# Patient Record
Sex: Male | Born: 1955 | Race: Black or African American | Hispanic: No | Marital: Single | Smoking: Former smoker
Health system: Southern US, Community
[De-identification: ages and names within clinical notes are randomized; demographics above are authoritative.]

## PROBLEM LIST (undated history)

## (undated) DIAGNOSIS — R011 Cardiac murmur, unspecified: Secondary | ICD-10-CM

## (undated) DIAGNOSIS — J449 Chronic obstructive pulmonary disease, unspecified: Secondary | ICD-10-CM

## (undated) DIAGNOSIS — N186 End stage renal disease: Secondary | ICD-10-CM

## (undated) DIAGNOSIS — N400 Enlarged prostate without lower urinary tract symptoms: Secondary | ICD-10-CM

## (undated) DIAGNOSIS — E119 Type 2 diabetes mellitus without complications: Secondary | ICD-10-CM

## (undated) DIAGNOSIS — E7800 Pure hypercholesterolemia, unspecified: Secondary | ICD-10-CM

## (undated) DIAGNOSIS — I10 Essential (primary) hypertension: Secondary | ICD-10-CM

## (undated) DIAGNOSIS — Z992 Dependence on renal dialysis: Secondary | ICD-10-CM

## (undated) HISTORY — DX: Cardiac murmur, unspecified: R01.1

## (undated) HISTORY — DX: Pure hypercholesterolemia, unspecified: E78.00

## (undated) HISTORY — DX: Essential (primary) hypertension: I10

## (undated) HISTORY — DX: Type 2 diabetes mellitus without complications: E11.9

## (undated) HISTORY — DX: Chronic obstructive pulmonary disease, unspecified: J44.9

## (undated) NOTE — *Deleted (*Deleted)
10/29/2019 3:52 PM   Daniel Thomas [birth year] [id number]  Referring provider: Marguerita Merles, [place],  [place] [address] No chief complaint on file.   HPI: Daniel Thomas is a 47 y.o. male who returns for a 4 month follow up of prostate cancer.   He was first diagnosed with this in 02/2018 at which time his PSA had jumped up to 20 from 10 the previous year. Is also noted to have multiple nodules on rectal exam. Prostate biopsy revealed Gleason 3+3 in 6 of 12 cores, primarily on the left up to 53% of the tissue.  PSA was elevated at 20.2 on 10/25/2019.   Patient underwent placement of a 23 cm tip to cuff tunneled hemodialysis catheter via the left internal jugular vein and removal of previous catheter with Dr. Lucky Cowboy on 10/17/2019.  PSA trend: Component     Latest Ref Rng & Units 09/22/2017 08/27/2018 06/24/2019 10/25/2019  Prostate Specific Ag, Serum     0.0 - 4.0 ng/mL 10.0 (H) 13.4 (H) 16.7 (H) 20.2 (H)     1. Prostate cancer   PMH: Past Medical History:  Diagnosis Date  . COPD (chronic obstructive pulmonary disease) ([place])   . Diabetes mellitus without complication ([place])   . Enlarged prostate   . ESRD on dialysis ([place])   . Heart murmur   . High cholesterol   . Hypertension     Surgical History: Past Surgical History:  Procedure Laterality Date  . DIALYSIS/PERMA CATHETER INSERTION N/A 01/12/2018   Procedure: DIALYSIS/PERMA CATHETER INSERTION;  Surgeon: Katha Cabal, MD;  Location: [facility];  Service: Cardiovascular;  Laterality: N/A;  . DIALYSIS/PERMA CATHETER INSERTION N/A 05/16/2019   Procedure: DIALYSIS/PERMA CATHETER INSERTION;  Surgeon: Algernon Huxley, MD;  Location: [facility];  Service: Cardiovascular;  Laterality: N/A;  . DIALYSIS/PERMA CATHETER INSERTION N/A 10/17/2019   Procedure: DIALYSIS/PERMA CATHETER INSERTION;  Surgeon: Algernon Huxley, MD;  Location: [facility];  Service: Cardiovascular;  Laterality:  N/A;  . DIALYSIS/PERMA CATHETER REMOVAL N/A 05/13/2019   Procedure: DIALYSIS/PERMA CATHETER REMOVAL;  Surgeon: Algernon Huxley, MD;  Location: [facility];  Service: Cardiovascular;  Laterality: N/A;  . TEE WITHOUT CARDIOVERSION N/A 05/15/2019   Procedure: TRANSESOPHAGEAL ECHOCARDIOGRAM (TEE);  Surgeon: Teodoro Spray, MD;  Location: [facility];  Service: Cardiovascular;  Laterality: N/A;    Home Medications:  Allergies as of 10/29/2019   No Known Allergies     Medication List       Accurate as of October 28, 2019  3:52 PM. If you have any questions, ask your nurse or doctor.        albuterol 108 (90 Base) MCG/ACT inhaler Commonly known as: VENTOLIN HFA Inhale 2 puffs into the lungs every 6 (six) hours as needed for wheezing or shortness of breath.   amLODipine 10 MG tablet Commonly known as: NORVASC Take 1 tablet (10 mg total) by mouth daily.   atorvastatin 40 MG tablet Commonly known as: LIPITOR   Auryxia 1 GM 210 MG(Fe) tablet Generic drug: ferric citrate Take by mouth.   calcium carbonate 500 MG chewable tablet Commonly known as: TUMS - dosed in mg elemental calcium Chew 2.5 tablets (500 mg of elemental calcium total) by mouth every 6 (six) hours as needed for indigestion.   carvedilol 12.5 MG tablet Commonly known as: COREG Take 12.5 mg by mouth 2 (two) times daily with a meal.   ceFAZolin 1-4 GM/50ML-%  Soln Commonly known as: ANCEF Inject 50 mLs (1 g total) into the vein daily.   citalopram 20 MG tablet Commonly known as: CELEXA Take 20 mg by mouth daily.   Drisdol 1.25 MG (50000 UT) capsule Generic drug: ergocalciferol Take by mouth. Administered at analysis   feeding supplement (GLUCERNA SHAKE) Liqd [facility]   hydrALAZINE 25 MG tablet Commonly known as: APRESOLINE Take 1 tablet (25 mg total) by mouth every 8 (eight) hours.   linagliptin 5 MG Tabs tablet Commonly known as: TRADJENTA Take 1 tablet (5 mg total) by mouth daily.   lovastatin  40 MG tablet Commonly known as: MEVACOR Take 1 tablet (40 mg total) by mouth at bedtime.   MIRCERA IJ Mircera   traZODone 50 MG tablet Commonly known as: DESYREL Take 25 mg by mouth at bedtime as needed for sleep.       Allergies: No Known Allergies  Family History: No family history on file.  Social History:  reports that he has quit smoking. He quit after 20.00 years of use. He has never used smokeless tobacco. He reports that he does not drink alcohol and does not use drugs.   Physical Exam: There were no vitals taken for this visit.  Constitutional:  Alert and oriented, No acute distress. HEENT: [place] AT, moist mucus membranes.  Trachea midline, no masses. Cardiovascular: No clubbing, cyanosis, or edema. Respiratory: Normal respiratory effort, no increased work of breathing. GI: Abdomen is soft, nontender, nondistended, no abdominal masses GU: No CVA tenderness Lymph: No cervical or inguinal lymphadenopathy. Skin: No rashes, bruises or suspicious lesions. Neurologic: Grossly intact, no focal deficits, moving all 4 extremities. Psychiatric: Normal mood and affect.  Laboratory Data:  Lab Results  Component Value Date   CREATININE 6.35 (H) 06/29/2019    No results found for: PSA  No results found for: TESTOSTERONE  Lab Results  Component Value Date   HGBA1C 8.5 (H) 01/07/2018    Urinalysis   Pertinent Imaging: *** No results found for this or any previous visit.  No results found for this or any previous visit.  No results found for this or any previous visit.  No results found for this or any previous visit.  Results for orders placed during the hospital encounter of 01/12/18  US RENAL  Narrative CLINICAL DATA:  Acute renal failure.  EXAM: RENAL / URINARY TRACT ULTRASOUND COMPLETE  COMPARISON:  No recent prior.  FINDINGS: Right Kidney:  Renal measurements: 9.9 x 5.8 x 5.6 cm. = volume: 167.2 mL. Increased echogenicity suggesting chronic  medical renal disease. No hydronephrosis. 1.4 cm and 0.8 cm simple cysts. Trace amount of fluid noted about the right kidney.  Left Kidney:  Renal measurements: 9.2 x 6.3 x 5.7 cm = volume: 172.3 mL. Increased echogenicity suggesting chronic medical renal disease. No hydronephrosis. 1.6 cm and 1.1 cm simple cysts. Trace amount of fluid noted about the left kidney.  Bladder:  Appears normal for degree of bladder distention.  IMPRESSION: 1.  Bilateral simple renal cysts.  2. Increased echogenicity both kidneys consistent with chronic medical renal disease. Trace amount of fluid noted about the kidneys. No hydronephrosis or bladder distention.   Electronically Signed By: Marcello Moores  Register On: 01/15/2018 14:55  No results found for this or any previous visit.  No results found for this or any previous visit.  No results found for this or any previous visit.   Assessment & Plan:     No follow-ups on file.  [facility]  [address], [place], [place] [address] [phone]  I, Selena Batten, am acting as a scribe for Dr. Hollice Espy.  {Add Barista Statement}

---

## 2006-12-14 ENCOUNTER — Emergency Department: Payer: Self-pay | Admitting: Emergency Medicine

## 2017-08-29 ENCOUNTER — Ambulatory Visit: Payer: Self-pay | Admitting: Urology

## 2017-09-22 ENCOUNTER — Encounter: Payer: Self-pay | Admitting: Urology

## 2017-09-22 ENCOUNTER — Emergency Department
Admission: EM | Admit: 2017-09-22 | Discharge: 2017-09-22 | Disposition: A | Discharge status: Home / Self Care | Payer: BLUE CROSS/BLUE SHIELD | Attending: Emergency Medicine | Admitting: Emergency Medicine

## 2017-09-22 ENCOUNTER — Ambulatory Visit (INDEPENDENT_AMBULATORY_CARE_PROVIDER_SITE_OTHER): Payer: BLUE CROSS/BLUE SHIELD | Admitting: Urology

## 2017-09-22 ENCOUNTER — Encounter: Payer: Self-pay | Admitting: Emergency Medicine

## 2017-09-22 VITALS — BP 230/124 | HR 108 | Ht 59.0 in | Wt 167.2 lb

## 2017-09-22 DIAGNOSIS — N189 Chronic kidney disease, unspecified: Secondary | ICD-10-CM | POA: Diagnosis not present

## 2017-09-22 DIAGNOSIS — I129 Hypertensive chronic kidney disease with stage 1 through stage 4 chronic kidney disease, or unspecified chronic kidney disease: Secondary | ICD-10-CM | POA: Insufficient documentation

## 2017-09-22 DIAGNOSIS — N184 Chronic kidney disease, stage 4 (severe): Secondary | ICD-10-CM

## 2017-09-22 DIAGNOSIS — R972 Elevated prostate specific antigen [PSA]: Secondary | ICD-10-CM | POA: Diagnosis not present

## 2017-09-22 DIAGNOSIS — Z87891 Personal history of nicotine dependence: Secondary | ICD-10-CM | POA: Diagnosis not present

## 2017-09-22 DIAGNOSIS — E1122 Type 2 diabetes mellitus with diabetic chronic kidney disease: Secondary | ICD-10-CM | POA: Diagnosis not present

## 2017-09-22 DIAGNOSIS — I10 Essential (primary) hypertension: Secondary | ICD-10-CM

## 2017-09-22 DIAGNOSIS — Z79899 Other long term (current) drug therapy: Secondary | ICD-10-CM | POA: Diagnosis not present

## 2017-09-22 DIAGNOSIS — Z7984 Long term (current) use of oral hypoglycemic drugs: Secondary | ICD-10-CM | POA: Insufficient documentation

## 2017-09-22 DIAGNOSIS — Z7982 Long term (current) use of aspirin: Secondary | ICD-10-CM | POA: Diagnosis not present

## 2017-09-22 DIAGNOSIS — J449 Chronic obstructive pulmonary disease, unspecified: Secondary | ICD-10-CM | POA: Diagnosis not present

## 2017-09-22 LAB — BLADDER SCAN AMB NON-IMAGING: Scan Result: 29

## 2017-09-22 MED ORDER — HYDRALAZINE HCL 50 MG PO TABS
25.0000 mg | ORAL_TABLET | Freq: Once | ORAL | Status: AC
  Administered 2017-09-22: 25 mg via ORAL
  Filled 2017-09-22: qty 1

## 2017-09-22 MED ORDER — AMLODIPINE BESYLATE 5 MG PO TABS
10.0000 mg | ORAL_TABLET | ORAL | Status: AC
  Administered 2017-09-22: 10 mg via ORAL
  Filled 2017-09-22: qty 2

## 2017-09-22 MED ORDER — METOPROLOL TARTRATE 50 MG PO TABS
50.0000 mg | ORAL_TABLET | Freq: Once | ORAL | Status: AC
  Administered 2017-09-22: 50 mg via ORAL
  Filled 2017-09-22: qty 1

## 2017-09-22 MED ORDER — HYDROCHLOROTHIAZIDE 12.5 MG PO CAPS
12.5000 mg | ORAL_CAPSULE | ORAL | Status: AC
  Administered 2017-09-22: 12.5 mg via ORAL
  Filled 2017-09-22 (×2): qty 1

## 2017-09-22 MED ORDER — CLONIDINE HCL 0.1 MG PO TABS
0.2000 mg | ORAL_TABLET | ORAL | Status: AC
  Administered 2017-09-22: 0.2 mg via ORAL
  Filled 2017-09-22: qty 2

## 2017-09-22 MED ORDER — LISINOPRIL 10 MG PO TABS
20.0000 mg | ORAL_TABLET | ORAL | Status: AC
  Administered 2017-09-22: 20 mg via ORAL
  Filled 2017-09-22: qty 2

## 2017-09-22 NOTE — ED Triage Notes (Signed)
Pt arrived from urologist with concerns over HTN. Pt's blood pressure in triage 228/118. Pt denies any pain. Pt has a hx of HTN and takes daily medication but did not take his medication this am.

## 2017-09-22 NOTE — ED Notes (Signed)
Pt given water to drink, but states he wants to wait until he gets home to eat.

## 2017-09-22 NOTE — ED Notes (Signed)
Spoke with MD Quentin Cornwall who gave verbal order for pt to be given his home bp medication. Pt unsure of which medication he takes. Will call pharmacy

## 2017-09-22 NOTE — ED Notes (Signed)
Spoke with [facility] who reports the following medication regimen; amlodipine 10mg  daily, metoprolol 50mg  twice a day, hydralazine 25mg  twice a day. Pt verifies with information

## 2017-09-22 NOTE — ED Triage Notes (Signed)
First Nurse Note:  Arrives from Urology clinic for ED evaluation of elevated BP.  Per staff, patient's blood pressure was elevated to 230/124 in clinic.  Patient is AAOx3.  Skin warm and dry. NAD.  States he did not take his bp medication today because he was in a hurry to make his appointment.

## 2017-09-22 NOTE — Progress Notes (Signed)
Upon arrival for his appointment with Dr. Hollice Espy, pts bp was elevated at 219/124. After his visit pt bp was at 230/117. I called and spoke with pt primary care clinic, [facility], they advised pt be seen in ED. Pt was escorted to ED per primary care doctors office.

## 2017-09-22 NOTE — Discharge Instructions (Addendum)
Follow-up with your primary care doctor as soon as possible for reevaluation of your blood pressure management.  Continue taking your blood pressure medicines every day as prescribed.  Call 911 if you have any sudden severe symptoms such as headache, vision change, passing out, chest pain, abdominal pain, or shortness of breath.

## 2017-09-22 NOTE — Progress Notes (Signed)
09/22/2017 8:22 PM   Daniel Thomas Nov 02, 1955 [id number]  Referring provider: Marguerita Merles, [place], [place] [address]  Chief Complaint  Patient presents with  . Elevated PSA    HPI: 62 year old male referred from [facility] for further evaluation of elevated PSA.  Patient underwent routine annual labs in July 2019 at which time his PSA was noted to be markedly elevated at 8.3.  We have no previous PSAs for comparison.  He believes it was 5 in the past.    He does have multiple medical comorbidities including hypertension, poorly controlled diabetes (hemoglobin A1c 13.5) amongst others.  No family history of prostate cancer.  He father had prostate issues which sound like BPH.   Some insignificant weight loss.  No bone pain.  No gross hematuria or UTIs.     He has no urinary symptoms including urgency, frequency, dysuria, decreased stream, or sensation of incomplete bladder emptying.  Nocturia x 3 but minimal bother.    IPSS    Row Name 09/22/17 1100         International Prostate Symptom Score   How often have you had the sensation of not emptying your bladder?  Not at All     How often have you had to urinate less than every two hours?  Not at All     How often have you found you stopped and started again several times when you urinated?  Not at All     How often have you found it difficult to postpone urination?  Not at All     How often have you had a weak urinary stream?  Not at All     How often have you had to strain to start urination?  Not at All     How many times did you typically get up at night to urinate?  3 Times     Total IPSS Score  3       Quality of Life due to urinary symptoms   If you were to spend the rest of your life with your urinary condition just the way it is now how would you feel about that?  Delighted        Score:  1-7 Mild 8-19 Moderate 20-35 Severe  PMH: Past Medical History:    Diagnosis Date  . COPD (chronic obstructive pulmonary disease) ([place])   . Diabetes mellitus without complication ([place])   . Heart murmur   . High cholesterol   . Hypertension     Surgical History: No past surgical history on file.  Home Medications:  Allergies as of 09/22/2017   No Known Allergies     Medication List        Accurate as of 09/22/17 11:59 PM. Always use your most recent med list.          amLODipine 10 MG tablet Commonly known as:  NORVASC Take 10 mg by mouth daily.   aspirin EC 81 MG tablet 81 mg nightly.   citalopram 20 MG tablet Commonly known as:  CELEXA Take 20 mg by mouth daily.   glipiZIDE 10 MG tablet Commonly known as:  GLUCOTROL   lisinopril-hydrochlorothiazide 20-12.5 MG tablet Commonly known as:  PRINZIDE,ZESTORETIC   lovastatin 40 MG tablet Commonly known as:  MEVACOR 40 mg nightly.   metFORMIN 1000 MG tablet Commonly known as:  GLUCOPHAGE Take by mouth.   metoprolol tartrate 50 MG tablet Commonly known as:  LOPRESSOR  Take 50 mg by mouth 2 (two) times daily.   naproxen 250 MG tablet Commonly known as:  NAPROSYN Take by mouth 2 (two) times daily with a meal.       Allergies: No Known Allergies  Family History: No family history on file.  Social History:  reports that he has quit smoking. He quit after 20.00 years of use. He has never used smokeless tobacco. He reports that he does not drink alcohol or use drugs.  ROS: UROLOGY Frequent Urination?: No Hard to postpone urination?: No Burning/pain with urination?: No Get up at night to urinate?: No Leakage of urine?: No Urine stream starts and stops?: No Trouble starting stream?: No Do you have to strain to urinate?: No Blood in urine?: No Urinary tract infection?: No Sexually transmitted disease?: No Injury to kidneys or bladder?: No Painful intercourse?: No Weak stream?: No Erection problems?: No Penile pain?: No  Gastrointestinal Nausea?: No Vomiting?:  No Indigestion/heartburn?: No Diarrhea?: No Constipation?: No  Constitutional Fever: No Night sweats?: No Weight loss?: No Fatigue?: No  Skin Skin rash/lesions?: No Itching?: No  Eyes Blurred vision?: No Double vision?: No  Ears/Nose/Throat Sore throat?: No Sinus problems?: No  Hematologic/Lymphatic Swollen glands?: No Easy bruising?: No  Cardiovascular Leg swelling?: No Chest pain?: No  Respiratory Cough?: No Shortness of breath?: No  Endocrine Excessive thirst?: No  Musculoskeletal Back pain?: No Joint pain?: No  Neurological Headaches?: No Dizziness?: No  Psychologic Depression?: No Anxiety?: No  Physical Exam: BP (!) 230/124 (BP Location: Left Arm, Patient Position: Sitting, Cuff Size: Normal) Comment: notified MD  Pulse (!) 108   Ht 4\' 11"  (1.499 m)   Wt 167 lb 3.2 oz (75.8 kg)   BMI 33.77 kg/m   Constitutional:  Alert and oriented, No acute distress. HEENT: [place] AT, moist mucus membranes.  Trachea midline, no masses. Cardiovascular: No clubbing, cyanosis, or edema. Respiratory: Normal respiratory effort, no increased work of breathing. GI: Abdomen is soft, nontender, nondistended, no abdominal masses GU: No CVA tenderness Rectal: Normal sphincter tone.  Massively elarged prostate, 50+ cc with rubbery nodule at apex, otherwise unremarkable. Skin: No rashes, bruises or suspicious lesions. Neurologic: Grossly intact, no focal deficits, moving all 4 extremities. Psychiatric: Normal mood and affect.  Laboratory Data: Records provided from [facility] clinic were reviewed  Hemoglobin A1c 13.5 Creatinine 2.72 PSA as above   Urinalysis N/a  Pertinent Imaging: Results for orders placed or performed in visit on 09/22/17  PSA  Result Value Ref Range   Prostate Specific Ag, Serum 10.0 (H) 0.0 - 4.0 ng/mL  Bladder Scan (Post Void Residual) in office  Result Value Ref Range   Scan Result 29     Assessment & Plan:    1. Elevated PSA  We  reviewed the implications of an elevated PSA and the uncertainty surrounding it. In general, a man's PSA increases with age and is produced by both normal and cancerous prostate tissue. Differential for elevated PSA is BPH, prostate cancer, infection, recent intercourse/ejaculation, prostate infarction, recent urethroscopic manipulation (foley placement/cystoscopy) and prostatitis. Management of an elevated PSA can include observation or prostate biopsy and wediscussed this in detail. We discussed that indications for prostate biopsy are defined by age and race specific PSA cutoffs as well as a PSA velocity of 0.75/year.  Given his age and ethnicity, I am somewhat concerned with his absolute PSA value.  That being said, his prostate is markedly enlarged today which may be appropriate for his volume.  Plan for repeat  PSA today.  If his PSA is stable or lower, we will likely follow him.  His PSA is more elevated, will consider prostate biopsy.  We did go ahead and discuss prostate biopsy at length today.  We discussed prostate biopsy in detail including the procedure itself, the risks of blood in the urine, stool, and ejaculate, serious infection, and discomfort. He is willing to proceed with this as discussed if needed.  - Bladder Scan (Post Void Residual) in office - PSA  2. CKD (chronic kidney disease) stage 4, GFR 15-29 ml/min (HCC) Void residual minimal today, not concern for obstruction as underlying protruding factor  Is likely related to poorly controlled diabetes and other comorbidities - Bladder Scan (Post Void Residual) in office - PSA   Return for f/u TBD based on repeat PSA- will call with results and recs.  Hollice Espy, MD  [facility] [address] [place], [place] [address] [phone]

## 2017-09-22 NOTE — ED Provider Notes (Signed)
[facility] Emergency Department Provider Note  ____________________________________________  Time seen: Approximately 7:03 PM  I have reviewed the triage vital signs and the nursing notes.   HISTORY  Chief Complaint Hypertension    HPI Daniel Thomas is a 62 y.o. male with a history of COPD diabetes high cholesterol and hypertension who was sent to the ED for evaluation of high blood pressure.   Patient denies any acute complaints.  He denies headache dizziness chest pain shortness of breath abdominal pain or back pain.  No syncope.  No exertional symptoms.  He reports that he has his medicines and has been taking them every day, but he did not take them this morning because he was going to see his doctor.  He reports being compliant otherwise.  Review of electronic medical record shows he recently saw his nephrologist who was unable to verify his blood pressure medicines because he left some at home, so they were deferring blood pressure management to primary care.  They recently completed labs and a renal ultrasound and are monitoring his kidney function closely.  Patient reports awareness and agreement with the counseling they provided including avoiding sugary drinks and sodas, no NSAIDs, increasing exercise.   Patient given hydralazine and metoprolol in triage.     Past Medical History:  Diagnosis Date  . COPD (chronic obstructive pulmonary disease) ([place])   . Diabetes mellitus without complication ([place])   . Heart murmur   . High cholesterol   . Hypertension      There are no active problems to display for this patient.    History reviewed. No pertinent surgical history.   Prior to Admission medications   Medication Sig Start Date End Date Taking? Authorizing Provider  amLODipine (NORVASC) 10 MG tablet Take 10 mg by mouth daily.    [provider]  aspirin EC 81 MG tablet 81 mg nightly. 11/15/05   [provider]  citalopram  (CELEXA) 20 MG tablet Take 20 mg by mouth daily.    [provider]  glipiZIDE (GLUCOTROL) 10 MG tablet  04/17/15   [provider]  lisinopril-hydrochlorothiazide (PRINZIDE,ZESTORETIC) 20-12.5 MG tablet  04/17/15   [provider]  lovastatin (MEVACOR) 40 MG tablet 40 mg nightly. 07/11/08   [provider]  metFORMIN (GLUCOPHAGE) 1000 MG tablet Take by mouth.    [provider]  metoprolol tartrate (LOPRESSOR) 50 MG tablet Take 50 mg by mouth 2 (two) times daily.    [provider]  naproxen (NAPROSYN) 250 MG tablet Take by mouth 2 (two) times daily with a meal.    [provider]     Allergies Patient has no known allergies.   No family history on file.  Social History Social History   Tobacco Use  . Smoking status: Former Smoker    Years: 20.00  . Smokeless tobacco: Never Used  Substance Use Topics  . Alcohol use: Never    Frequency: Never  . Drug use: Never    Review of Systems  Constitutional:   No fever or chills.  ENT:   No sore throat. No rhinorrhea. Cardiovascular:   No chest pain or syncope. Respiratory:   No dyspnea or cough. Gastrointestinal:   Negative for abdominal pain, vomiting and diarrhea.  Musculoskeletal:   Negative for focal pain or swelling All other systems reviewed and are negative except as documented above in ROS and HPI.  ____________________________________________   PHYSICAL EXAM:  VITAL SIGNS: ED Triage Vitals [09/22/17 1139]  Enc Vitals Group     BP (!) 228/118     Pulse Rate (!) 102     Resp 18     Temp 98.1 F (36.7 C)     Temp Source Oral     SpO2 100 %     Weight 167 lb 3.2 oz (75.8 kg)     Height 4\' 11"  (1.499 m)     Head Circumference      Peak Flow      Pain Score 0     Pain Loc      Pain Edu?      Excl. in [place]?     Vital signs reviewed, nursing assessments reviewed.   Constitutional:   Alert and oriented. Non-toxic appearance.  Ambulatory, calm. Eyes:    Conjunctivae are normal. EOMI. PERRL. ENT      Head:   Normocephalic and atraumatic.      Nose:   No congestion/rhinnorhea.       Mouth/Throat:   MMM, no pharyngeal erythema. No peritonsillar mass.       Neck:   No meningismus. Full ROM. Hematological/Lymphatic/Immunilogical:   No cervical lymphadenopathy. Cardiovascular:   RRR. Symmetric bilateral radial and DP pulses.  No murmurs. Cap refill less than 2 seconds. Respiratory:   Normal respiratory effort without tachypnea/retractions. Breath sounds are clear and equal bilaterally. No wheezes/rales/rhonchi. Gastrointestinal:   Soft and nontender. Non distended. There is no CVA tenderness.  No rebound, rigidity, or guarding. Genitourinary:   deferred Musculoskeletal:   Normal range of motion in all extremities. No joint effusions.  No lower extremity tenderness.  No edema. Neurologic:   Normal speech and language.  Motor grossly intact. No acute focal neurologic deficits are appreciated.  Skin:    Skin is warm, dry and intact. No rash noted.  No petechiae, purpura, or bullae.  ____________________________________________    LABS (pertinent positives/negatives) (all labs ordered are listed, but only abnormal results are displayed) Labs Reviewed - No data to display ____________________________________________   EKG    ____________________________________________    RADIOLOGY  No results found.  ____________________________________________   PROCEDURES Procedures  ____________________________________________    CLINICAL IMPRESSION / ASSESSMENT AND PLAN / ED COURSE  Pertinent labs & imaging results that were available during my care of the patient were reviewed by me and considered in my medical decision making (see chart for details).    Patient presents with asymptomatic severe uncontrolled hypertension.  No evidence of endorgan dysfunction.  Appears to be due to skipping his medication doses today.  Is provided 1  dose of his hydralazine and metoprolol earlier in triage.  Talking to the patient and reviewing electronic medical record, it seems that he also takes amlodipine 10, lisinopril 20, HCTZ 12.5 which I have ordered for him.  I will also give him a single dose of clonidine for a little bit quicker blood pressure improvement.  I like to monitor the patient in the ED to ensure improvement in the numbers, although his M AP is still far below levels considered worrisome for spontaneous hemorrhage.    ----------------------------------------- 7:07 PM on 09/22/2017 -----------------------------------------  Patient unwilling to wait any longer for his blood pressure to improve.  Remains asymptomatic.  He is in a hurry to be ordered at home because he has a relatively long drive to [place] to get home.  Counseled him to call 911 if he has any new or sudden symptoms.  Take his hydralazine and metoprolol when he gets home otherwise take  all his medications as prescribed in the morning follow-up with his doctor as soon as possible after the weekend.      ____________________________________________   FINAL CLINICAL IMPRESSION(S) / ED DIAGNOSES    Final diagnoses:  Essential hypertension  Chronic kidney disease, unspecified CKD stage     ED Discharge Orders    None      Portions of this note were generated with dragon dictation software. Dictation errors may occur despite best attempts at proofreading.    Carrie Mew, MD 09/22/17 Einar Crow

## 2017-09-23 LAB — PSA: Prostate Specific Ag, Serum: 10 ng/mL — ABNORMAL HIGH (ref 0.0–4.0)

## 2017-09-25 ENCOUNTER — Telehealth: Payer: Self-pay | Admitting: Urology

## 2017-09-25 NOTE — Telephone Encounter (Signed)
App made and instructions mailed to the patient Discussed with patient over the phone Judson Roch is verifying with patient's PCP that he does not need clearance to stop aspirin. Sharyn Lull

## 2017-09-25 NOTE — Telephone Encounter (Signed)
-----   Message from Hollice Espy, MD sent at 09/25/2017  7:55 AM EDT ----- We discussed prostate biopsy as an option in the office along with risks and benefits.  His PSA is actually going up and down to 10.  I would strongly recommend prostate biopsy.  Please provide him with an instruction sheet and schedule this.  Hollice Espy, MD

## 2017-10-24 ENCOUNTER — Other Ambulatory Visit: Payer: BLUE CROSS/BLUE SHIELD | Admitting: Urology

## 2017-10-30 ENCOUNTER — Telehealth: Payer: Self-pay

## 2017-10-30 NOTE — Telephone Encounter (Signed)
Cardiac clearance faxed to Dr. Alveria Apley office

## 2017-10-30 NOTE — Telephone Encounter (Signed)
-----   Message from Benard Halsted sent at 09/25/2017 10:08 AM EDT ----- Regarding: prostate BX I called the patient today to discuss the BX instructions and to set up is appointment. He had no idea what I was talking about at first. He was confused as to why he needed a BX and didn't remember talking to anyone about needing one. He stated that he was one aspirin everyday and was put on it by his PCP Dr. Lennox Grumbles because she thought he had a heartache in the past. He was not sure of who his cardiologist was or when he last saw one.  I went over all the instructions but I am not really confident that he truly understood them. I checked and there is not a DPR on file for him. Judson Roch can you please check to make sure he does not need clearance for the BX please to stop his aspirin. I am mailing the appointment and instructions to him and told him to call the office with any questions once he got them.   Thanks, Sharyn Lull

## 2017-11-07 ENCOUNTER — Ambulatory Visit: Payer: BLUE CROSS/BLUE SHIELD | Admitting: Urology

## 2017-11-14 ENCOUNTER — Other Ambulatory Visit: Payer: BLUE CROSS/BLUE SHIELD | Admitting: Urology

## 2017-11-28 ENCOUNTER — Ambulatory Visit: Payer: BLUE CROSS/BLUE SHIELD | Admitting: Urology

## 2018-01-05 ENCOUNTER — Telehealth: Payer: Self-pay

## 2018-01-05 NOTE — Telephone Encounter (Signed)
Cardiac Clearance was received from Dr. Nehemiah Massed and is ok to stop ASA 7 days prior to biopsy. Please schedule patient for biopsy at soonest available thanks

## 2018-01-05 NOTE — Telephone Encounter (Signed)
Spoke with patient went over instructions over the phone with the patient and mailed   Daniel Thomas

## 2018-01-06 ENCOUNTER — Emergency Department: Payer: BLUE CROSS/BLUE SHIELD

## 2018-01-06 ENCOUNTER — Inpatient Hospital Stay
Admission: EM | Admit: 2018-01-06 | Discharge: 2018-01-08 | DRG: 291 | Disposition: A | Discharge status: Home / Self Care | Payer: BLUE CROSS/BLUE SHIELD | Attending: Internal Medicine | Admitting: Internal Medicine

## 2018-01-06 DIAGNOSIS — D631 Anemia in chronic kidney disease: Secondary | ICD-10-CM | POA: Diagnosis present

## 2018-01-06 DIAGNOSIS — I5033 Acute on chronic diastolic (congestive) heart failure: Secondary | ICD-10-CM | POA: Diagnosis present

## 2018-01-06 DIAGNOSIS — J449 Chronic obstructive pulmonary disease, unspecified: Secondary | ICD-10-CM | POA: Diagnosis present

## 2018-01-06 DIAGNOSIS — I340 Nonrheumatic mitral (valve) insufficiency: Secondary | ICD-10-CM | POA: Diagnosis not present

## 2018-01-06 DIAGNOSIS — R972 Elevated prostate specific antigen [PSA]: Secondary | ICD-10-CM | POA: Diagnosis present

## 2018-01-06 DIAGNOSIS — Z87891 Personal history of nicotine dependence: Secondary | ICD-10-CM

## 2018-01-06 DIAGNOSIS — J9601 Acute respiratory failure with hypoxia: Secondary | ICD-10-CM | POA: Diagnosis not present

## 2018-01-06 DIAGNOSIS — R0600 Dyspnea, unspecified: Secondary | ICD-10-CM

## 2018-01-06 DIAGNOSIS — I10 Essential (primary) hypertension: Secondary | ICD-10-CM | POA: Diagnosis present

## 2018-01-06 DIAGNOSIS — I161 Hypertensive emergency: Secondary | ICD-10-CM | POA: Diagnosis present

## 2018-01-06 DIAGNOSIS — E785 Hyperlipidemia, unspecified: Secondary | ICD-10-CM | POA: Diagnosis present

## 2018-01-06 DIAGNOSIS — I169 Hypertensive crisis, unspecified: Secondary | ICD-10-CM | POA: Diagnosis present

## 2018-01-06 DIAGNOSIS — N2581 Secondary hyperparathyroidism of renal origin: Secondary | ICD-10-CM | POA: Diagnosis present

## 2018-01-06 DIAGNOSIS — Z7982 Long term (current) use of aspirin: Secondary | ICD-10-CM

## 2018-01-06 DIAGNOSIS — Z9119 Patient's noncompliance with other medical treatment and regimen: Secondary | ICD-10-CM

## 2018-01-06 DIAGNOSIS — F329 Major depressive disorder, single episode, unspecified: Secondary | ICD-10-CM | POA: Diagnosis present

## 2018-01-06 DIAGNOSIS — I130 Hypertensive heart and chronic kidney disease with heart failure and stage 1 through stage 4 chronic kidney disease, or unspecified chronic kidney disease: Secondary | ICD-10-CM | POA: Diagnosis not present

## 2018-01-06 DIAGNOSIS — Z7984 Long term (current) use of oral hypoglycemic drugs: Secondary | ICD-10-CM | POA: Diagnosis not present

## 2018-01-06 DIAGNOSIS — I361 Nonrheumatic tricuspid (valve) insufficiency: Secondary | ICD-10-CM | POA: Diagnosis not present

## 2018-01-06 DIAGNOSIS — E872 Acidosis: Secondary | ICD-10-CM | POA: Diagnosis present

## 2018-01-06 DIAGNOSIS — N400 Enlarged prostate without lower urinary tract symptoms: Secondary | ICD-10-CM | POA: Diagnosis present

## 2018-01-06 DIAGNOSIS — R809 Proteinuria, unspecified: Secondary | ICD-10-CM

## 2018-01-06 DIAGNOSIS — E7800 Pure hypercholesterolemia, unspecified: Secondary | ICD-10-CM | POA: Diagnosis present

## 2018-01-06 DIAGNOSIS — E1122 Type 2 diabetes mellitus with diabetic chronic kidney disease: Secondary | ICD-10-CM | POA: Diagnosis present

## 2018-01-06 DIAGNOSIS — N186 End stage renal disease: Secondary | ICD-10-CM | POA: Diagnosis not present

## 2018-01-06 DIAGNOSIS — Z9114 Patient's other noncompliance with medication regimen: Secondary | ICD-10-CM | POA: Diagnosis not present

## 2018-01-06 DIAGNOSIS — N179 Acute kidney failure, unspecified: Secondary | ICD-10-CM | POA: Diagnosis present

## 2018-01-06 DIAGNOSIS — J9600 Acute respiratory failure, unspecified whether with hypoxia or hypercapnia: Secondary | ICD-10-CM | POA: Diagnosis present

## 2018-01-06 DIAGNOSIS — I160 Hypertensive urgency: Secondary | ICD-10-CM

## 2018-01-06 DIAGNOSIS — N184 Chronic kidney disease, stage 4 (severe): Secondary | ICD-10-CM | POA: Diagnosis present

## 2018-01-06 DIAGNOSIS — Z8249 Family history of ischemic heart disease and other diseases of the circulatory system: Secondary | ICD-10-CM

## 2018-01-06 HISTORY — DX: Benign prostatic hyperplasia without lower urinary tract symptoms: N40.0

## 2018-01-06 LAB — CBC
HCT: 33.3 % — ABNORMAL LOW (ref 39.0–52.0)
Hemoglobin: 10.6 g/dL — ABNORMAL LOW (ref 13.0–17.0)
MCH: 28.6 pg (ref 26.0–34.0)
MCHC: 31.8 g/dL (ref 30.0–36.0)
MCV: 89.8 fL (ref 80.0–100.0)
Platelets: 424 10*3/uL — ABNORMAL HIGH (ref 150–400)
RBC: 3.71 MIL/uL — ABNORMAL LOW (ref 4.22–5.81)
RDW: 14.2 % (ref 11.5–15.5)
WBC: 13.2 10*3/uL — AB (ref 4.0–10.5)
nRBC: 0 % (ref 0.0–0.2)

## 2018-01-06 LAB — CBC WITH DIFFERENTIAL/PLATELET
ABS IMMATURE GRANULOCYTES: 0.04 10*3/uL (ref 0.00–0.07)
Basophils Absolute: 0.1 10*3/uL (ref 0.0–0.1)
Basophils Relative: 1 %
EOS PCT: 2 %
Eosinophils Absolute: 0.2 10*3/uL (ref 0.0–0.5)
HCT: 32.5 % — ABNORMAL LOW (ref 39.0–52.0)
Hemoglobin: 10.6 g/dL — ABNORMAL LOW (ref 13.0–17.0)
Immature Granulocytes: 0 %
Lymphocytes Relative: 15 %
Lymphs Abs: 1.5 10*3/uL (ref 0.7–4.0)
MCH: 29.4 pg (ref 26.0–34.0)
MCHC: 32.6 g/dL (ref 30.0–36.0)
MCV: 90 fL (ref 80.0–100.0)
MONO ABS: 0.9 10*3/uL (ref 0.1–1.0)
MONOS PCT: 8 %
NRBC: 0 % (ref 0.0–0.2)
Neutro Abs: 7.6 10*3/uL (ref 1.7–7.7)
Neutrophils Relative %: 74 %
Platelets: 394 10*3/uL (ref 150–400)
RBC: 3.61 MIL/uL — ABNORMAL LOW (ref 4.22–5.81)
RDW: 14.1 % (ref 11.5–15.5)
WBC: 10.3 10*3/uL (ref 4.0–10.5)

## 2018-01-06 LAB — BASIC METABOLIC PANEL
ANION GAP: 5 (ref 5–15)
BUN: 50 mg/dL — ABNORMAL HIGH (ref 8–23)
CO2: 20 mmol/L — ABNORMAL LOW (ref 22–32)
Calcium: 8.1 mg/dL — ABNORMAL LOW (ref 8.9–10.3)
Chloride: 112 mmol/L — ABNORMAL HIGH (ref 98–111)
Creatinine, Ser: 4.01 mg/dL — ABNORMAL HIGH (ref 0.61–1.24)
GFR calc non Af Amer: 15 mL/min — ABNORMAL LOW (ref >60)
GFR, EST AFRICAN AMERICAN: 17 mL/min — AB (ref >60)
Glucose, Bld: 183 mg/dL — ABNORMAL HIGH (ref 70–99)
Potassium: 4.6 mmol/L (ref 3.5–5.1)
Sodium: 137 mmol/L (ref 135–145)

## 2018-01-06 LAB — TROPONIN I: Troponin I: 0.04 ng/mL (ref <0.03)

## 2018-01-06 LAB — INFLUENZA PANEL BY PCR (TYPE A & B)
INFLAPCR: NEGATIVE
Influenza B By PCR: NEGATIVE

## 2018-01-06 LAB — BRAIN NATRIURETIC PEPTIDE: B Natriuretic Peptide: 467 pg/mL — ABNORMAL HIGH (ref 0.0–100.0)

## 2018-01-06 MED ORDER — ACETAMINOPHEN 650 MG RE SUPP: 650.0000 mg | Freq: Four times a day (QID) | RECTAL | Status: DC | PRN

## 2018-01-06 MED ORDER — SODIUM CHLORIDE 0.9% FLUSH
3.0000 mL | Freq: Two times a day (BID) | INTRAVENOUS | Status: DC
  Administered 2018-01-06 – 2018-01-08 (×4): 3 mL via INTRAVENOUS

## 2018-01-06 MED ORDER — POLYETHYLENE GLYCOL 3350 17 G PO PACK: 17.0000 g | PACK | Freq: Every day | ORAL | Status: DC | PRN

## 2018-01-06 MED ORDER — SODIUM CHLORIDE 0.9 % IV SOLN: 250.0000 mL | INTRAVENOUS | Status: DC | PRN

## 2018-01-06 MED ORDER — LABETALOL HCL 5 MG/ML IV SOLN
20.0000 mg | Freq: Once | INTRAVENOUS | Status: AC
  Administered 2018-01-06: 20 mg via INTRAVENOUS
  Filled 2018-01-06: qty 4

## 2018-01-06 MED ORDER — ONDANSETRON HCL 4 MG/2ML IJ SOLN: 4.0000 mg | Freq: Four times a day (QID) | INTRAMUSCULAR | Status: DC | PRN

## 2018-01-06 MED ORDER — ACETAMINOPHEN 325 MG PO TABS: 650.0000 mg | ORAL_TABLET | Freq: Four times a day (QID) | ORAL | Status: DC | PRN

## 2018-01-06 MED ORDER — HYDRALAZINE HCL 20 MG/ML IJ SOLN
10.0000 mg | INTRAMUSCULAR | Status: DC | PRN
  Administered 2018-01-08: 10 mg via INTRAVENOUS
  Filled 2018-01-06: qty 1

## 2018-01-06 MED ORDER — INSULIN ASPART 100 UNIT/ML ~~LOC~~ SOLN: 0.0000 [IU] | Freq: Every day | SUBCUTANEOUS | Status: DC

## 2018-01-06 MED ORDER — NICOTINE 14 MG/24HR TD PT24
14.0000 mg | MEDICATED_PATCH | Freq: Every day | TRANSDERMAL | Status: DC
  Filled 2018-01-06: qty 1

## 2018-01-06 MED ORDER — ASPIRIN EC 81 MG PO TBEC
81.0000 mg | DELAYED_RELEASE_TABLET | Freq: Every day | ORAL | Status: DC
  Administered 2018-01-07 – 2018-01-08 (×2): 81 mg via ORAL
  Filled 2018-01-06 (×2): qty 1

## 2018-01-06 MED ORDER — HEPARIN SODIUM (PORCINE) 5000 UNIT/ML IJ SOLN
5000.0000 [IU] | Freq: Three times a day (TID) | INTRAMUSCULAR | Status: DC
  Administered 2018-01-06 – 2018-01-08 (×5): 5000 [IU] via SUBCUTANEOUS
  Filled 2018-01-06 (×5): qty 1

## 2018-01-06 MED ORDER — ALBUTEROL SULFATE (2.5 MG/3ML) 0.083% IN NEBU
2.5000 mg | INHALATION_SOLUTION | Freq: Four times a day (QID) | RESPIRATORY_TRACT | Status: DC
  Administered 2018-01-07 (×4): 2.5 mg via RESPIRATORY_TRACT
  Filled 2018-01-06 (×4): qty 3

## 2018-01-06 MED ORDER — CITALOPRAM HYDROBROMIDE 20 MG PO TABS
20.0000 mg | ORAL_TABLET | Freq: Every day | ORAL | Status: DC
  Administered 2018-01-07 – 2018-01-08 (×2): 20 mg via ORAL
  Filled 2018-01-06 (×2): qty 1

## 2018-01-06 MED ORDER — HYDRALAZINE HCL 20 MG/ML IJ SOLN
10.0000 mg | Freq: Once | INTRAMUSCULAR | Status: AC
  Administered 2018-01-06: 10 mg via INTRAVENOUS
  Filled 2018-01-06: qty 1

## 2018-01-06 MED ORDER — IPRATROPIUM-ALBUTEROL 0.5-2.5 (3) MG/3ML IN SOLN
3.0000 mL | Freq: Once | RESPIRATORY_TRACT | Status: AC
  Administered 2018-01-06: 3 mL via RESPIRATORY_TRACT
  Filled 2018-01-06: qty 3

## 2018-01-06 MED ORDER — INSULIN ASPART 100 UNIT/ML ~~LOC~~ SOLN
0.0000 [IU] | Freq: Three times a day (TID) | SUBCUTANEOUS | Status: DC
  Administered 2018-01-07 (×3): 3 [IU] via SUBCUTANEOUS
  Filled 2018-01-06 (×3): qty 1

## 2018-01-06 MED ORDER — PRAVASTATIN SODIUM 40 MG PO TABS
80.0000 mg | ORAL_TABLET | Freq: Every day | ORAL | Status: DC
  Administered 2018-01-07: 80 mg via ORAL
  Filled 2018-01-06: qty 2

## 2018-01-06 MED ORDER — NICARDIPINE HCL IN NACL 20-0.86 MG/200ML-% IV SOLN
3.0000 mg/h | INTRAVENOUS | Status: DC
  Administered 2018-01-06: 5 mg/h via INTRAVENOUS
  Administered 2018-01-07: 2.5 mg/h via INTRAVENOUS
  Filled 2018-01-06: qty 200

## 2018-01-06 MED ORDER — METOPROLOL TARTRATE 50 MG PO TABS
50.0000 mg | ORAL_TABLET | Freq: Two times a day (BID) | ORAL | Status: DC
  Administered 2018-01-06 – 2018-01-08 (×4): 50 mg via ORAL
  Filled 2018-01-06 (×4): qty 1

## 2018-01-06 MED ORDER — ONDANSETRON HCL 4 MG PO TABS: 4.0000 mg | ORAL_TABLET | Freq: Four times a day (QID) | ORAL | Status: DC | PRN

## 2018-01-06 MED ORDER — NICARDIPINE HCL IN NACL 20-0.86 MG/200ML-% IV SOLN
INTRAVENOUS | Status: AC
  Filled 2018-01-06: qty 200

## 2018-01-06 MED ORDER — SODIUM CHLORIDE 0.9% FLUSH: 3.0000 mL | INTRAVENOUS | Status: DC | PRN

## 2018-01-06 MED ORDER — FUROSEMIDE 10 MG/ML IJ SOLN
40.0000 mg | Freq: Two times a day (BID) | INTRAMUSCULAR | Status: DC
  Administered 2018-01-06 – 2018-01-07 (×3): 40 mg via INTRAVENOUS
  Filled 2018-01-06 (×4): qty 4

## 2018-01-06 NOTE — H&P (Signed)
[facility] NAME: Daniel Thomas    MR#:  [id number]  DATE OF BIRTH:  05-Jul-1955  DATE OF ADMISSION:  01/06/2018  PRIMARY CARE PHYSICIAN: Marguerita Merles, MD   REQUESTING/REFERRING PHYSICIAN:   CHIEF COMPLAINT:  No chief complaint on file.   HISTORY OF PRESENT ILLNESS: Daniel Thomas  is a 63 y.o. male with a known history per below which includes history of end-stage renal disease-currently not on hemodialysis, presenting with acute shortness of breath, dyspnea on exertion, inability to lay flat in bed overnight, and emergency room patient was tachycardic, tachypneic, hypertensive with systolic pressure greater than 223/103, creatinine 4, BNP greater than 400, chest x-ray noted for cardiomegaly, patient evaluated in the emergency room, mild respiratory distress on oxygen via nasal cannula, patient is now been admitted for acute malignant hypertensive crisis congestive heart failure exacerbation.  PAST MEDICAL HISTORY:   Past Medical History:  Diagnosis Date  . COPD (chronic obstructive pulmonary disease) ([place])   . Diabetes mellitus without complication ([place])   . Enlarged prostate   . Heart murmur   . High cholesterol   . Hypertension     PAST SURGICAL HISTORY:  None  SOCIAL HISTORY:  Social History   Tobacco Use  . Smoking status: Former Smoker    Years: 20.00  . Smokeless tobacco: Never Used  Substance Use Topics  . Alcohol use: Never    Frequency: Never    FAMILY HISTORY:  HTN  DRUG ALLERGIES: No Known Allergies  REVIEW OF SYSTEMS:   CONSTITUTIONAL: No fever,+ fatigue, weakness.  EYES: No blurred or double vision.  EARS, NOSE, AND THROAT: No tinnitus or ear pain.  RESPIRATORY: + cough, shortness of breath, no wheezing or hemoptysis.  CARDIOVASCULAR: No chest pain, orthopnea, edema.  GASTROINTESTINAL: No nausea, vomiting, diarrhea or abdominal pain.  GENITOURINARY: No dysuria, hematuria.  ENDOCRINE: No polyuria,  nocturia,  HEMATOLOGY: No anemia, easy bruising or bleeding SKIN: No rash or lesion. MUSCULOSKELETAL: No joint pain or arthritis.   NEUROLOGIC: No tingling, numbness, weakness.  PSYCHIATRY: No anxiety or depression.   MEDICATIONS AT HOME:  Prior to Admission medications   Medication Sig Start Date End Date Taking? Authorizing Provider  amLODipine (NORVASC) 10 MG tablet Take 10 mg by mouth daily.    [provider]  aspirin EC 81 MG tablet 81 mg nightly. 11/15/05   [provider]  citalopram (CELEXA) 20 MG tablet Take 20 mg by mouth daily.    [provider]  glipiZIDE (GLUCOTROL) 10 MG tablet  04/17/15   [provider]  lisinopril-hydrochlorothiazide (PRINZIDE,ZESTORETIC) 20-12.5 MG tablet  04/17/15   [provider]  lovastatin (MEVACOR) 40 MG tablet 40 mg nightly. 07/11/08   [provider]  metFORMIN (GLUCOPHAGE) 1000 MG tablet Take by mouth.    [provider]  metoprolol tartrate (LOPRESSOR) 50 MG tablet Take 50 mg by mouth 2 (two) times daily.    [provider]      PHYSICAL EXAMINATION:   VITAL SIGNS: Blood pressure (!) 201/89, pulse (!) 103, temperature 98.1 F (36.7 C), temperature source Oral, resp. rate (!) 30, height 4\' 11"  (1.499 m), weight 75.8 kg, SpO2 98 %.  GENERAL:  63 y.o.-year-old patient lying in the bed with no acute distress.  Overweight EYES: Pupils equal, round, reactive to light and accommodation. No scleral icterus. Extraocular muscles intact.  HEENT: Head atraumatic, normocephalic. Oropharynx and nasopharynx clear.  NECK:  Supple, no jugular venous distention.  No thyroid enlargement, no tenderness.  LUNGS: Mild Rales at the bases. Mild use of accessory muscles of respiration.  CARDIOVASCULAR: S1, S2 normal. No murmurs, rubs, or gallops.  ABDOMEN: Soft, nontender, nondistended. Bowel sounds present. No organomegaly or mass.  EXTREMITIES: + edema, no cyanosis, or clubbing.  NEUROLOGIC:  Cranial nerves II through XII are intact. Muscle strength 5/5 in all extremities. Sensation intact. Gait not checked.  PSYCHIATRIC: The patient is alert and oriented x 3.  SKIN: No obvious rash, lesion, or ulcer.   LABORATORY PANEL:   CBC Recent Labs  Lab 01/06/18 1959  WBC 10.3  HGB 10.6*  HCT 32.5*  PLT 394  MCV 90.0  MCH 29.4  MCHC 32.6  RDW 14.1  LYMPHSABS 1.5  MONOABS 0.9  EOSABS 0.2  BASOSABS 0.1   ------------------------------------------------------------------------------------------------------------------  Chemistries  Recent Labs  Lab 01/06/18 1959  NA 137  K 4.6  CL 112*  CO2 20*  GLUCOSE 183*  BUN 50*  CREATININE 4.01*  CALCIUM 8.1*   ------------------------------------------------------------------------------------------------------------------ estimated creatinine clearance is 15.9 mL/min (A) (by C-G formula based on SCr of 4.01 mg/dL (H)). ------------------------------------------------------------------------------------------------------------------ No results for input(s): TSH, T4TOTAL, T3FREE, THYROIDAB in the last 72 hours.  Invalid input(s): FREET3   Coagulation profile No results for input(s): INR, PROTIME in the last 168 hours. ------------------------------------------------------------------------------------------------------------------- No results for input(s): DDIMER in the last 72 hours. -------------------------------------------------------------------------------------------------------------------  Cardiac Enzymes Recent Labs  Lab 01/06/18 1959  TROPONINI 0.04*   ------------------------------------------------------------------------------------------------------------------ Invalid input(s): POCBNP  ---------------------------------------------------------------------------------------------------------------  Urinalysis No results found for: COLORURINE, APPEARANCEUR, LABSPEC, PHURINE, GLUCOSEU, HGBUR,  BILIRUBINUR, KETONESUR, PROTEINUR, UROBILINOGEN, NITRITE, LEUKOCYTESUR   RADIOLOGY: Dg Chest Port 1 View  Result Date: 01/06/2018 CLINICAL DATA:  Shortness of breath for 3 hours, oxygen desaturation, history COPD, diabetes mellitus, hypertension, former smoker EXAM: PORTABLE CHEST 1 VIEW COMPARISON:  Portable exam 1939 hours without priors for comparison FINDINGS: Enlargement of cardiac silhouette. Mediastinal contours and pulmonary vascularity normal. Lungs clear. No infiltrate, pleural effusion or pneumothorax. Bones demineralized. IMPRESSION: Enlargement of cardiac silhouette without acute infiltrate. Electronically Signed   By: Lavonia Dana M.D.   On: 01/06/2018 20:01    EKG: Orders placed or performed during the hospital encounter of 01/06/18  . ED EKG  . ED EKG    IMPRESSION AND PLAN: *Acute malignant hypertensive crisis With associated congestive heart failure exacerbation Admit to stepdown unit, discussed with intensivist, Cardene drip with tapering as tolerated, IV Lasix twice daily, nephrology consulted given history of end-stage renal disease-currently not on hemodialysis, supplemental oxygen with weaning as tolerated, rule out acute coronary syndrome with cardiac enzymes x3 sets, and continue close medical monitoring  *Acute probable diastolic congestive heart failure exacerbation Exacerbated by above  Congestive heart failure protocol, IV Lasix for now, Lopressor, aspirin, avoid ACE inhibitors/ARB given worsening renal functioning, strict I&O monitoring, daily weights, check echocardiogram  *Chronic kidney disease IV/V With history of ESRD-currently not on hemodialysis Nephrology consulted for possible need for future hemodialysis, strict I&O monitoring, daily weights  *Chronic hyperlipidemia, unspecified Continue statin therapy  *Chronic diabetes mellitus type 2 Discontinue metformin and glipizide, sliding scale insulin with Accu-Cheks per routine for now, check hemoglobin  A1c determine level control   All the records are reviewed and case discussed with ED provider. Management plans discussed with the patient, family and they are in agreement.  CODE STATUS:full  TOTAL TIME TAKING CARE OF THIS PATIENT: 40 minutes.    Avel Peace Dwan Fennel M.D on 01/06/2018   Between 7am to 6pm -  Pager - [phone]  After 6pm go to [email or web address] - password [email or web address] [facility]  Office  [phone]  CC: Primary care physician; Marguerita Merles, MD   Note: This dictation was prepared with Dragon dictation along with smaller phrase technology. Any transcriptional errors that result from this process are unintentional.

## 2018-01-06 NOTE — ED Triage Notes (Signed)
Pt arrived from home via EMS with complaints of SOB breath that started about 3 hours ago. Pt is alert and oriented x 4. Pt can ambulate freely but becomes SOB when doing so. Pt was 97%RA and dropped to 90% after ambulating EMS placed pt on 2L nasal cannula and pt is at 96%. Pt has Hx of cardiac issues, kidney disease, an enlarged prostate and HTN. EMS stated that they heard rales in his lower lobes. VS per EMS BP-251/140 BS-120 Temp-98.5 oral CO2-36-39. EMS placed 18L AC. Pt denies any pain.

## 2018-01-06 NOTE — ED Notes (Signed)
Date and time results received: 01/06/18 2044 (use smartphrase ".now" to insert current time)  Test: Troponin Critical Value: 0.04  Name of Provider Notified: Dr. Jimmye Norman  Orders Received? Or Actions Taken?:

## 2018-01-06 NOTE — ED Provider Notes (Addendum)
[facility] Emergency Department Provider Note       Time seen: ----------------------------------------- 7:35 PM on 01/06/2018 -----------------------------------------   I have reviewed the triage vital signs and the nursing notes.  HISTORY   Chief Complaint No chief complaint on file.    HPI Daniel Thomas is a 63 y.o. male with a history of COPD, diabetes, hypertension, hyperlipidemia who presents to the ED for shortness of breath.  Patient reportedly has a history of end-stage renal disease but is not on dialysis.  He has not had any flulike symptoms.  He presents short of breath that was worse with ambulation.  He denies fevers, chills, chest pain or other complaints.  Past Medical History:  Diagnosis Date  . COPD (chronic obstructive pulmonary disease) ([place])   . Diabetes mellitus without complication ([place])   . Heart murmur   . High cholesterol   . Hypertension     There are no active problems to display for this patient.   No past surgical history on file.  Allergies Patient has no known allergies.  Social History Social History   Tobacco Use  . Smoking status: Former Smoker    Years: 20.00  . Smokeless tobacco: Never Used  Substance Use Topics  . Alcohol use: Never    Frequency: Never  . Drug use: Never   Review of Systems Constitutional: Negative for fever. Cardiovascular: Negative for chest pain. Respiratory: Positive for shortness of breath Gastrointestinal: Negative for abdominal pain, vomiting and diarrhea. Musculoskeletal: Negative for back pain. Skin: Negative for rash. Neurological: Negative for headaches, focal weakness or numbness.  All systems negative/normal/unremarkable except as stated in the HPI  ____________________________________________   PHYSICAL EXAM:  VITAL SIGNS: ED Triage Vitals  Enc Vitals Group     BP      Pulse      Resp      Temp      Temp src      SpO2      Weight      Height    Head Circumference      Peak Flow      Pain Score      Pain Loc      Pain Edu?      Excl. in [place]?    Constitutional: Alert and oriented.  Chronically ill-appearing, mild distress Eyes: Conjunctivae are normal. Normal extraocular movements. Cardiovascular: Normal rate, regular rhythm. No murmurs, rubs, or gallops. Respiratory: Tachypnea with mostly clear breath sounds Gastrointestinal: Soft and nontender. Normal bowel sounds Musculoskeletal: Nontender with normal range of motion in extremities. No lower extremity tenderness nor edema. Neurologic:  Normal speech and language. No gross focal neurologic deficits are appreciated.  Skin:  Skin is warm, dry and intact. No rash noted. Psychiatric: Mood and affect are normal. Speech and behavior are normal.  ____________________________________________  EKG: Interpreted by me.  Sinus tachycardia rate 110 bpm, old septal infarct age-indeterminate, normal axis, normal QT  ____________________________________________  ED COURSE:  As part of my medical decision making, I reviewed the following data within the [place] History obtained from family if available, nursing notes, old chart and ekg, as well as notes from prior ED visits. Patient presented for shortness of breath, we will assess with labs and imaging as indicated at this time.   Procedures ____________________________________________   LABS (pertinent positives/negatives)  Labs Reviewed  CBC WITH DIFFERENTIAL/PLATELET - Abnormal; Notable for the following components:      Result Value   RBC  3.61 (*)    Hemoglobin 10.6 (*)    HCT 32.5 (*)    All other components within normal limits  BASIC METABOLIC PANEL - Abnormal; Notable for the following components:   Chloride 112 (*)    CO2 20 (*)    Glucose, Bld 183 (*)    BUN 50 (*)    Creatinine, Ser 4.01 (*)    Calcium 8.1 (*)    GFR calc non Af Amer 15 (*)    GFR calc Af Amer 17 (*)    All other components  within normal limits  BRAIN NATRIURETIC PEPTIDE - Abnormal; Notable for the following components:   B Natriuretic Peptide 467.0 (*)    All other components within normal limits  TROPONIN I - Abnormal; Notable for the following components:   Troponin I 0.04 (*)    All other components within normal limits  INFLUENZA PANEL BY PCR (TYPE A & B)   CRITICAL CARE Performed by: Laurence Aly   Total critical care time: 30 minutes  Critical care time was exclusive of separately billable procedures and treating other patients.  Critical care was necessary to treat or prevent imminent or life-threatening deterioration.  Critical care was time spent personally by me on the following activities: development of treatment plan with patient and/or surrogate as well as nursing, discussions with consultants, evaluation of patient's response to treatment, examination of patient, obtaining history from patient or surrogate, ordering and performing treatments and interventions, ordering and review of laboratory studies, ordering and review of radiographic studies, pulse oximetry and re-evaluation of patient's condition.  RADIOLOGY Images were viewed by me  Chest x-ray IMPRESSION: Enlargement of cardiac silhouette without acute infiltrate. ____________________________________________   DIFFERENTIAL DIAGNOSIS   Pulmonary edema, end-stage renal disease, pneumonia, influenza  FINAL ASSESSMENT AND PLAN  Dyspnea, end-stage renal disease, hypertensive urgency   Plan: The patient had presented for shortness of breath. Patient's labs did reveal likely chronic changes but apparent worsening in his renal function.  BNP was also elevated but there are not overt signs of pulmonary edema.  Troponin is likely chronically elevated. Patient's imaging did not reveal any acute process.  We gave 2 doses of IV antihypertensives.  I will discuss with hospitalist for admission regarding dyspnea, end-stage renal  disease and likely hypertensive urgency   Laurence Aly, MD    Note: This note was generated in part or whole with voice recognition software. Voice recognition is usually quite accurate but there are transcription errors that can and very often do occur. I apologize for any typographical errors that were not detected and corrected.     Earleen Newport, MD 01/06/18 2123    Earleen Newport, MD 01/16/18 [phone]

## 2018-01-06 NOTE — ED Notes (Signed)
Pt has bag of medications at bedside

## 2018-01-06 NOTE — ED Notes (Signed)
Called floor to give report. Floor requests to call me back to get report

## 2018-01-06 NOTE — ED Notes (Signed)
Called floor to give report. Floor requests to call me back for report. This RN will call back in 5 min.

## 2018-01-06 NOTE — Consult Note (Signed)
Name: Daniel Thomas MRN: [id number] DOB: 12-28-1955    ADMISSION DATE:  01/06/2018 CONSULTATION DATE: 01/06/2017  REFERRING MD : Dr. Jerelyn Charles   CHIEF COMPLAINT: Shortness of Breath   BRIEF PATIENT DESCRIPTION: 63 yo male with CKD admitted with acute respiratory failure and hypertensive emergency requiring nicardipine gtt  SIGNIFICANT EVENTS  01/4-Pt admitted to the stepdown unit   HISTORY OF PRESENT ILLNESS:   This is a 63 yo male with a PMH of HTN, Hypercholesteremia, Heart Murmur, Enlarged Prostate, CKD Stage 4 managed [facility] Nephrologist Dr. 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  He presented to [facility] ER on 01/4 with c/o shortness of breath worse with ambulation.  In the ER pt tachypneic, tachycardiac hr 108-111, and hypertensive bp 223/103.  He received duoneb treatment, 20 mg iv labetalol, and 10 mg iv hydralazine.  Lab results revealed glucose 183, BUN 50, creatinine 4.01, BNP 467, troponin 0.04, hgb 10.6, influenza pcr negative, and CXR negative for acute infiltrate.  Despite antihypertensive medications pt remained hypertensive, requiring nicardipine gtt.  He was subsequently admitted to the stepdown unit for additional workup and treatment.    PAST MEDICAL HISTORY :   has a past medical history of COPD (chronic obstructive pulmonary disease) ([place]), Diabetes mellitus without complication ([place]), Enlarged prostate, Heart murmur, High cholesterol, and Hypertension.  has no past surgical history on file. Prior to Admission medications   Medication Sig Start Date End Date Taking? Authorizing Provider  amLODipine (NORVASC) 10 MG tablet Take 10 mg by mouth daily.    [provider]  aspirin EC 81 MG tablet 81 mg nightly. 11/15/05   [provider]  citalopram (CELEXA) 20 MG  tablet Take 20 mg by mouth daily.    [provider]  glipiZIDE (GLUCOTROL) 10 MG tablet  04/17/15   [provider]  lisinopril-hydrochlorothiazide (PRINZIDE,ZESTORETIC) 20-12.5 MG tablet  04/17/15   [provider]  lovastatin (MEVACOR) 40 MG tablet 40 mg nightly. 07/11/08   [provider]  metFORMIN (GLUCOPHAGE) 1000 MG tablet Take by mouth.    [provider]  metoprolol tartrate (LOPRESSOR) 50 MG tablet Take 50 mg by mouth 2 (two) times daily.    [provider]   No Known Allergies  FAMILY HISTORY:  family history is not on file. SOCIAL HISTORY:  reports that he has quit smoking. He quit after 20.00 years of use. He has never used smokeless tobacco. He reports that he does not drink alcohol or use drugs.  REVIEW OF SYSTEMS: Positives in BOLD  Constitutional: Negative for fever, chills, weight loss, malaise/fatigue and diaphoresis.  HENT: Negative for hearing loss, ear pain, nosebleeds, congestion, sore throat, neck pain, tinnitus and ear discharge.   Eyes: Negative for blurred vision, double vision, photophobia, pain, discharge and redness.  Respiratory: cough, hemoptysis, sputum production, shortness of breath, orthopnea, wheezing and stridor.   Cardiovascular: Negative for chest pain, palpitations, orthopnea, claudication, leg swelling and PND.  Gastrointestinal: Negative for heartburn, nausea, vomiting, abdominal pain, diarrhea, constipation, blood in stool and melena.  Genitourinary: Negative for dysuria, urgency, frequency, hematuria and flank pain.  Musculoskeletal: Negative for myalgias, back pain, joint pain and falls.  Skin: Negative for itching and rash.  Neurological: Negative for dizziness, tingling, tremors, sensory change, speech change, focal weakness, seizures, loss of consciousness, weakness and headaches.  Endo/Heme/Allergies: Negative for environmental allergies and polydipsia. Does not bruise/bleed  easily.  SUBJECTIVE:  c/o mild shortness of breath   VITAL SIGNS: Temp:  [98.1 F (36.7 C)] 98.1 F (36.7 C) (01/04 1948) Pulse Rate:  [100-111] 104 (01/04 2200) Resp:  [23-36] 32 (01/04 2200) BP: (192-229)/(89-108) 192/95 (01/04 2200) SpO2:  [96 %-98 %] 97 % (01/04 2200) Weight:  [75.8 kg] 75.8 kg (01/04 1950)  PHYSICAL EXAMINATION: General: well developed, well nourished male, NAD Neuro: alert and oriented, follows commands HEENT: supple, no JVD Cardiovascular: nsr, rrr, no R/G Lungs: faint crackles throughout, even, non labored  Abdomen: +BS x4, obese, soft, non tender, non distended  Musculoskeletal: trace bilateral lower extremity edema  Skin: intact no rashes or lesions present   Recent Labs  Lab 01/06/18 1959  NA 137  K 4.6  CL 112*  CO2 20*  BUN 50*  CREATININE 4.01*  GLUCOSE 183*   Recent Labs  Lab 01/06/18 1959  HGB 10.6*  HCT 32.5*  WBC 10.3  PLT 394   Dg Chest Port 1 View  Result Date: 01/06/2018 CLINICAL DATA:  Shortness of breath for 3 hours, oxygen desaturation, history COPD, diabetes mellitus, hypertension, former smoker EXAM: PORTABLE CHEST 1 VIEW COMPARISON:  Portable exam 1939 hours without priors for comparison FINDINGS: Enlargement of cardiac silhouette. Mediastinal contours and pulmonary vascularity normal. Lungs clear. No infiltrate, pleural effusion or pneumothorax. Bones demineralized. IMPRESSION: Enlargement of cardiac silhouette without acute infiltrate. Electronically Signed   By: Lavonia Soleil Mas M.D.   On: 01/06/2018 20:01    ASSESSMENT / PLAN:  Acute respiratory failure likely in setting of component of pulmonary edema and hypertensive emergency  Supplemental O2 for dyspnea and/or hypoxia Prn bronchodilator therapy Repeat CXR in am   Hypertensive emergency  Mildly elevated troponin likely demand ischemia in setting of respiratory failure  Continuous telemetry monitoring  Trend troponin's Elevated BNP 467-Echo pending Nicardipine  gtt for sbp goal <160 Continue outpatient metoprolol and pravachol  Continue iv lasix   CKD (baseline GFR 15-29 ml/min currently not on dialysis) Trend BMP Replace electrolytes as indicated  Monitor UOP Avoid nephrotoxic medications Nephrology consulted appreciate input   Chronic Anemia  VTE px: subq heparin  Trend CBC  Monitor for s/sx of bleeding and transfuse for hgb <7  Type II Diabetes Mellitus  CBG's ac/hs  SSI   Marda Stalker, [place] Pager [phone] (please enter 7 digits) PCCM Consult Pager [phone] (please enter 7 digits)

## 2018-01-07 ENCOUNTER — Inpatient Hospital Stay (HOSPITAL_COMMUNITY)
Admit: 2018-01-07 | Discharge: 2018-01-07 | Disposition: A | Discharge status: Home / Self Care | Payer: BLUE CROSS/BLUE SHIELD | Attending: Family Medicine | Admitting: Family Medicine

## 2018-01-07 DIAGNOSIS — I361 Nonrheumatic tricuspid (valve) insufficiency: Secondary | ICD-10-CM

## 2018-01-07 DIAGNOSIS — I340 Nonrheumatic mitral (valve) insufficiency: Secondary | ICD-10-CM

## 2018-01-07 DIAGNOSIS — J9601 Acute respiratory failure with hypoxia: Secondary | ICD-10-CM

## 2018-01-07 LAB — URINALYSIS, ROUTINE W REFLEX MICROSCOPIC
Bilirubin Urine: NEGATIVE
GLUCOSE, UA: 50 mg/dL — AB
Ketones, ur: NEGATIVE mg/dL
Leukocytes, UA: NEGATIVE
Nitrite: NEGATIVE
PH: 5 (ref 5.0–8.0)
Protein, ur: 100 mg/dL — AB
Specific Gravity, Urine: 1.006 (ref 1.005–1.030)
Squamous Epithelial / HPF: NONE SEEN (ref 0–5)

## 2018-01-07 LAB — GLUCOSE, CAPILLARY
Glucose-Capillary: 144 mg/dL — ABNORMAL HIGH (ref 70–99)
Glucose-Capillary: 128 mg/dL — ABNORMAL HIGH (ref 70–99)
Glucose-Capillary: 134 mg/dL — ABNORMAL HIGH (ref 70–99)
Glucose-Capillary: 132 mg/dL — ABNORMAL HIGH (ref 70–99)

## 2018-01-07 LAB — ECHOCARDIOGRAM COMPLETE
HEIGHTINCHES: 59 in
WEIGHTICAEL: 2672 [oz_av]

## 2018-01-07 LAB — LIPID PANEL
Cholesterol: 210 mg/dL — ABNORMAL HIGH (ref 0–200)
HDL: 50 mg/dL (ref >40)
LDL Cholesterol: 139 mg/dL — ABNORMAL HIGH (ref 0–99)
Total CHOL/HDL Ratio: 4.2 RATIO
Triglycerides: 107 mg/dL (ref <150)
VLDL: 21 mg/dL (ref 0–40)

## 2018-01-07 LAB — CREATININE, SERUM
Creatinine, Ser: 3.86 mg/dL — ABNORMAL HIGH (ref 0.61–1.24)
GFR calc Af Amer: 18 mL/min — ABNORMAL LOW (ref >60)
GFR calc non Af Amer: 16 mL/min — ABNORMAL LOW (ref >60)

## 2018-01-07 LAB — BASIC METABOLIC PANEL
Anion gap: 9 (ref 5–15)
BUN: 57 mg/dL — ABNORMAL HIGH (ref 8–23)
CO2: 16 mmol/L — ABNORMAL LOW (ref 22–32)
Calcium: 8.2 mg/dL — ABNORMAL LOW (ref 8.9–10.3)
Chloride: 112 mmol/L — ABNORMAL HIGH (ref 98–111)
Creatinine, Ser: 3.92 mg/dL — ABNORMAL HIGH (ref 0.61–1.24)
GFR calc Af Amer: 18 mL/min — ABNORMAL LOW (ref >60)
GFR calc non Af Amer: 15 mL/min — ABNORMAL LOW (ref >60)
Glucose, Bld: 135 mg/dL — ABNORMAL HIGH (ref 70–99)
Potassium: 4.7 mmol/L (ref 3.5–5.1)
Sodium: 137 mmol/L (ref 135–145)

## 2018-01-07 LAB — HEPATIC FUNCTION PANEL
ALT: 28 U/L (ref 0–44)
AST: 21 U/L (ref 15–41)
Albumin: 3.3 g/dL — ABNORMAL LOW (ref 3.5–5.0)
Alkaline Phosphatase: 103 U/L (ref 38–126)
Bilirubin, Direct: <0.1 mg/dL (ref 0.0–0.2)
Total Bilirubin: 0.6 mg/dL (ref 0.3–1.2)
Total Protein: 6.8 g/dL (ref 6.5–8.1)

## 2018-01-07 LAB — CBC
HCT: 32.1 % — ABNORMAL LOW (ref 39.0–52.0)
Hemoglobin: 10.3 g/dL — ABNORMAL LOW (ref 13.0–17.0)
MCH: 29.2 pg (ref 26.0–34.0)
MCHC: 32.1 g/dL (ref 30.0–36.0)
MCV: 90.9 fL (ref 80.0–100.0)
Platelets: 416 10*3/uL — ABNORMAL HIGH (ref 150–400)
RBC: 3.53 MIL/uL — ABNORMAL LOW (ref 4.22–5.81)
RDW: 14.3 % (ref 11.5–15.5)
WBC: 12.8 10*3/uL — ABNORMAL HIGH (ref 4.0–10.5)
nRBC: 0 % (ref 0.0–0.2)

## 2018-01-07 LAB — TROPONIN I
Troponin I: 0.04 ng/mL (ref <0.03)
Troponin I: 0.06 ng/mL (ref <0.03)

## 2018-01-07 LAB — PROTEIN / CREATININE RATIO, URINE
Creatinine, Urine: 26 mg/dL
PROTEIN CREATININE RATIO: 5.54 mg/mg{creat} — AB (ref 0.00–0.15)
Total Protein, Urine: 144 mg/dL

## 2018-01-07 LAB — MRSA PCR SCREENING: MRSA by PCR: NEGATIVE

## 2018-01-07 LAB — TSH: TSH: 1.882 u[IU]/mL (ref 0.350–4.500)

## 2018-01-07 LAB — HEMOGLOBIN A1C
Hgb A1c MFr Bld: 8.5 % — ABNORMAL HIGH (ref 4.8–5.6)
Mean Plasma Glucose: 197.25 mg/dL

## 2018-01-07 MED ORDER — AMLODIPINE BESYLATE 10 MG PO TABS
10.0000 mg | ORAL_TABLET | Freq: Every day | ORAL | Status: DC
  Administered 2018-01-07 – 2018-01-08 (×2): 10 mg via ORAL
  Filled 2018-01-07 (×2): qty 1

## 2018-01-07 NOTE — Plan of Care (Signed)
  Problem: Clinical Measurements: Goal: Ability to maintain clinical measurements within normal limits will improve Outcome: Progressing Note:  SBP less than 160 so far this shift without PRN IV medications.  O2 sats remained aboe 97% while ambulating in hall on room air.

## 2018-01-07 NOTE — Progress Notes (Signed)
[name], male   DOB: [birth year], 63 y.o.   MRN: [id number]  [facility] PROGRESS NOTE  Daniel Thomas DZH:[id number] DOB: [birth year] DOA: 01/06/2018 PCP: Marguerita Merles, MD  HPI/Subjective: Patient came in with shortness of breath.  Had very accelerated hypertension was initially admitted to the ICU.  Blood pressure trending better and seen on the floor.  Patient states that he urinates good.  He states he ran out of his water pill.  Patient hoping to go home tomorrow.  Objective: Vitals:   01/07/18 0925 01/07/18 1201  BP: (!) 161/82 (!) 156/90  Pulse: 88 93  Resp:  18  Temp:    SpO2:  100%    Filed Weights   01/06/18 1950  Weight: 75.8 kg    ROS: Review of Systems  Constitutional: Negative for chills and fever.  Eyes: Negative for blurred vision.  Respiratory: Positive for shortness of breath. Negative for cough.   Cardiovascular: Negative for chest pain.  Gastrointestinal: Negative for abdominal pain, constipation, diarrhea, nausea and vomiting.  Genitourinary: Negative for dysuria.  Musculoskeletal: Negative for joint pain.  Neurological: Negative for dizziness and headaches.   Exam: Physical Exam  Constitutional: He is oriented to person, place, and time.  HENT:  Nose: No mucosal edema.  Mouth/Throat: No oropharyngeal exudate or posterior oropharyngeal edema.  Eyes: Pupils are equal, round, and reactive to light. Conjunctivae, EOM and lids are normal.  Neck: No JVD present. Carotid bruit is not present. No edema present. No thyroid mass and no thyromegaly present.  Cardiovascular: S1 normal and S2 normal. Exam reveals no gallop.  No murmur heard. Pulses:      Dorsalis pedis pulses are 2+ on the right side and 2+ on the left side.  Respiratory: No respiratory distress. He has decreased breath sounds in the right lower field and the left lower field. He has no wheezes. He has no rhonchi. He has rales in the right lower field and the left lower  field.  GI: Soft. Bowel sounds are normal. There is no abdominal tenderness.  Musculoskeletal:     Right ankle: He exhibits no swelling.     Left ankle: He exhibits no swelling.  Lymphadenopathy:    He has no cervical adenopathy.  Neurological: He is alert and oriented to person, place, and time. No cranial nerve deficit.  Skin: Skin is warm. No rash noted. Nails show no clubbing.  Psychiatric: He has a normal mood and affect.      Data Reviewed: Basic Metabolic Panel: Recent Labs  Lab 01/06/18 1959 01/06/18 2333 01/07/18 0641  NA 137  --  137  K 4.6  --  4.7  CL 112*  --  112*  CO2 20*  --  16*  GLUCOSE 183*  --  135*  BUN 50*  --  57*  CREATININE 4.01* 3.86* 3.92*  CALCIUM 8.1*  --  8.2*   Liver Function Tests: Recent Labs  Lab 01/07/18 1215  AST 21  ALT 28  ALKPHOS 103  BILITOT 0.6  PROT 6.8  ALBUMIN 3.3*   CBC: Recent Labs  Lab 01/06/18 1959 01/06/18 2333 01/07/18 0641  WBC 10.3 13.2* 12.8*  NEUTROABS 7.6  --   --   HGB 10.6* 10.6* 10.3*  HCT 32.5* 33.3* 32.1*  MCV 90.0 89.8 90.9  PLT 394 424* 416*   Cardiac Enzymes: Recent Labs  Lab 01/06/18 1959 01/06/18 2333 01/07/18 0641  TROPONINI 0.04* 0.04* 0.06*   BNP (last 3 results)  Recent Labs    01/06/18 1959  BNP 467.0*    CBG: Recent Labs  Lab 01/07/18 0734 01/07/18 1115  GLUCAP 144* 128*    Recent Results (from the past 240 hour(s))  MRSA PCR Screening     Status: None   Collection Time: 01/06/18 10:49 PM  Result Value Ref Range Status   MRSA by PCR NEGATIVE NEGATIVE Final    Comment:        The GeneXpert MRSA Assay (FDA approved for NASAL specimens only), is one component of a comprehensive MRSA colonization surveillance program. It is not intended to diagnose MRSA infection nor to guide or monitor treatment for MRSA infections. Performed at [facility], [address] [place], [place] [address]      Studies: Dg Chest [place] 1 View  Result Date:  01/06/2018 CLINICAL DATA:  Shortness of breath for 3 hours, oxygen desaturation, history COPD, diabetes mellitus, hypertension, former smoker EXAM: PORTABLE CHEST 1 VIEW COMPARISON:  Portable exam 1939 hours without priors for comparison FINDINGS: Enlargement of cardiac silhouette. Mediastinal contours and pulmonary vascularity normal. Lungs clear. No infiltrate, pleural effusion or pneumothorax. Bones demineralized. IMPRESSION: Enlargement of cardiac silhouette without acute infiltrate. Electronically Signed   By: Lavonia Dana M.D.   On: 01/06/2018 20:01    Scheduled Meds: . albuterol  2.5 mg Nebulization Q6H  . amLODipine  10 mg Oral Daily  . aspirin EC  81 mg Oral Daily  . citalopram  20 mg Oral Daily  . furosemide  40 mg Intravenous Q12H  . heparin  5,000 Units Subcutaneous Q8H  . insulin aspart  0-20 Units Subcutaneous TID WC  . insulin aspart  0-5 Units Subcutaneous QHS  . metoprolol tartrate  50 mg Oral BID  . pravastatin  80 mg Oral q1800  . sodium chloride flush  3 mL Intravenous Q12H   Continuous Infusions: . sodium chloride      Assessment/Plan:  1. Acute diastolic congestive heart failure.  Noncompliance with water pill.  Patient on Lasix 40 mg IV twice daily.  With chronic kidney disease we will be limited with ACE inhibitor at this point. 2. Accelerated hypertension on presentation.  Initially ordered on a drip.  Now on oral metoprolol amlodipine and Lasix IV.  Hydrochlorothiazide probably will not work for this patient where his creatinine is. 3. Chronic kidney disease stage IV.  Metabolic acidosis.  Proteinuria.  Nephrology ordered serological studies. 4. Type 2 diabetes mellitus on sliding scale.  Metformin is not a good medication in this patient and neither is glipizide.  Will add on hemoglobin A1c.  May be a candidate for low-dose Tradjenta at home. 5. Depression on Celexa 6. Hyperlipidemia unspecified on pravastatin  Code Status:     Code Status Orders  (From  admission, onward)         Start     Ordered   01/06/18 2228  Full code  Continuous     01/06/18 2227        Code Status History    This patient has a current code status but no historical code status.     Family Communication: Son at the bedside Disposition Plan: To be determined  Consultants:  Nephrology  Time spent: 35 minutes  [place]

## 2018-01-07 NOTE — Progress Notes (Signed)
Pt going to 2A-Rm 250. Report given to [facility]. Pt A+O X4. SR & ST. BP 140s/60s. q4h prn hydralazine if SBP>160. Diminished lung sound. Renal/Carb modified diet. Pink foam due 1/8. CXR showed enlargement of cardiac silhouette w/o acute infiltrate.

## 2018-01-07 NOTE — Consult Note (Signed)
[facility]  CONSULT NOTE    Date: 01/07/2018                  Patient Name:  Daniel Thomas  MRN: [id number]  DOB: 06-14-1955  Age / Sex: 63 y.o., male         PCP: Marguerita Merles, MD                 Service Requesting Consult: Dr. Earleen Newport                 Reason for Consult: Acute renal failure            History of Present Illness: [name] presents with shortness of breath, peripheral edema, orthopnea to [facility]. This has been progressive over the last week. Patient claims to be taking all his medications as prescribed. Patient has not been following a low salt diet.   Brother at bedside who assists with history taking.   Patient was taken to ICU where he was started on IV furosemide and nicardipine gtt. Echocardiogram consistent with diastolic congestive heart failure.   Medications: Outpatient medications: Medications Prior to Admission  Medication Sig Dispense Refill Last Dose  . amLODipine (NORVASC) 10 MG tablet Take 10 mg by mouth daily.   01/06/2018 at Unknown time  . aspirin EC 81 MG tablet 81 mg nightly.   01/06/2018 at Unknown time  . carvedilol (COREG) 12.5 MG tablet Take 12.5 mg by mouth 2 (two) times daily.   01/06/2018 at Unknown time  . chlorthalidone (HYGROTON) 25 MG tablet Take 12.5 mg by mouth every morning.   01/06/2018 at Unknown time  . citalopram (CELEXA) 20 MG tablet Take 20 mg by mouth daily.   01/06/2018 at Unknown time  . glipiZIDE (GLUCOTROL) 10 MG tablet    01/06/2018 at Unknown time  . hydrALAZINE (APRESOLINE) 100 MG tablet Take 100 mg by mouth 2 (two) times daily.   01/06/2018 at Unknown time  . lisinopril-hydrochlorothiazide (PRINZIDE,ZESTORETIC) 10-12.5 MG tablet Take 1 tablet by mouth daily.   01/06/2018 at Unknown time  . lovastatin (MEVACOR) 40 MG tablet 40 mg nightly.   01/06/2018 at Unknown time  . metoprolol tartrate (LOPRESSOR) 50 MG tablet Take 50 mg by mouth 2 (two) times daily.   01/06/2018 at Unknown time  . traZODone  (DESYREL) 50 MG tablet Take 25 mg by mouth Nightly.   01/06/2018 at Unknown time  . Vitamin D, Ergocalciferol, (DRISDOL) 1.25 MG (50000 UT) CAPS capsule Take 50,000 Units by mouth once a week.   Past Week at Unknown time  . lisinopril-hydrochlorothiazide (PRINZIDE,ZESTORETIC) 20-12.5 MG tablet    Not Taking at Unknown time  . metFORMIN (GLUCOPHAGE) 1000 MG tablet Take by mouth.   Not Taking at Unknown time    Current medications: Current Facility-Administered Medications  Medication Dose Route Frequency Provider Last Rate Last Dose  . 0.9 %  sodium chloride infusion  250 mL Intravenous PRN Salary, Montell D, MD      . acetaminophen (TYLENOL) tablet 650 mg  650 mg Oral Q6H PRN Salary, Montell D, MD       Or  . acetaminophen (TYLENOL) suppository 650 mg  650 mg Rectal Q6H PRN Salary, Montell D, MD      . albuterol (PROVENTIL) (2.5 MG/3ML) 0.083% nebulizer solution 2.5 mg  2.5 mg Nebulization Q6H Salary, Montell D, MD   2.5 mg at 01/07/18 0758  . amLODipine (NORVASC) tablet 10 mg  10 mg  Oral Daily Conforti, John, DO   10 mg at 01/07/18 0943  . aspirin EC tablet 81 mg  81 mg Oral Daily Salary, Montell D, MD   81 mg at 01/07/18 0943  . citalopram (CELEXA) tablet 20 mg  20 mg Oral Daily Salary, Montell D, MD   20 mg at 01/07/18 0943  . furosemide (LASIX) injection 40 mg  40 mg Intravenous Q12H Salary, Montell D, MD   40 mg at 01/07/18 0942  . heparin injection 5,000 Units  5,000 Units Subcutaneous Q8H Salary, Montell D, MD   5,000 Units at 01/07/18 0546  . hydrALAZINE (APRESOLINE) injection 10-20 mg  10-20 mg Intravenous Q4H PRN Awilda Bill, NP      . insulin aspart (novoLOG) injection 0-20 Units  0-20 Units Subcutaneous TID WC Gorden Harms, MD   3 Units at 01/07/18 1204  . insulin aspart (novoLOG) injection 0-5 Units  0-5 Units Subcutaneous QHS Salary, Montell D, MD      . metoprolol tartrate (LOPRESSOR) tablet 50 mg  50 mg Oral BID Loney Hering D, MD   50 mg at 01/07/18 0942  .  ondansetron (ZOFRAN) tablet 4 mg  4 mg Oral Q6H PRN Salary, Montell D, MD       Or  . ondansetron (ZOFRAN) injection 4 mg  4 mg Intravenous Q6H PRN Salary, Montell D, MD      . polyethylene glycol (MIRALAX / GLYCOLAX) packet 17 g  17 g Oral Daily PRN Salary, Montell D, MD      . pravastatin (PRAVACHOL) tablet 80 mg  80 mg Oral q1800 Salary, Montell D, MD      . sodium chloride flush (NS) 0.9 % injection 3 mL  3 mL Intravenous Q12H Salary, Montell D, MD   3 mL at 01/07/18 0944  . sodium chloride flush (NS) 0.9 % injection 3 mL  3 mL Intravenous PRN Salary, Avel Peace, MD          Allergies: No Known Allergies    Past Medical History: Past Medical History:  Diagnosis Date  . COPD (chronic obstructive pulmonary disease) ([place])   . Diabetes mellitus without complication ([place])   . Enlarged prostate   . Heart murmur   . High cholesterol   . Hypertension      Past Surgical History: History reviewed. No pertinent surgical history.   Family History: History reviewed. No pertinent family history.   Social History: Social History   Socioeconomic History  . Marital status: Single    Spouse name: Not on file  . Number of children: Not on file  . Years of education: Not on file  . Highest education level: Not on file  Occupational History  . Not on file  Social Needs  . Financial resource strain: Not on file  . Food insecurity:    Worry: Not on file    Inability: Not on file  . Transportation needs:    Medical: Not on file    Non-medical: Not on file  Tobacco Use  . Smoking status: Former Smoker    Years: 20.00  . Smokeless tobacco: Never Used  Substance and Sexual Activity  . Alcohol use: Never    Frequency: Never  . Drug use: Never  . Sexual activity: Not on file  Lifestyle  . Physical activity:    Days per week: Not on file    Minutes per session: Not on file  . Stress: Not on file  Relationships  . Social connections:  Talks on phone: Not on file    Gets  together: Not on file    Attends religious service: Not on file    Active member of club or organization: Not on file    Attends meetings of clubs or organizations: Not on file    Relationship status: Not on file  . Intimate partner violence:    Fear of current or ex partner: Not on file    Emotionally abused: Not on file    Physically abused: Not on file    Forced sexual activity: Not on file  Other Topics Concern  . Not on file  Social History Narrative  . Not on file     Review of Systems: ROS  Vital Signs: Blood pressure (!) 156/90, pulse 93, temperature 98.3 F (36.8 C), temperature source Oral, resp. rate 18, height 4' 11"  (1.499 m), weight 75.8 kg, SpO2 100 %.  Weight trends: Filed Weights   01/06/18 1950  Weight: 75.8 kg    Physical Exam: General: NAD, laying in bed  Head: Normocephalic, atraumatic. Moist oral mucosal membranes  Eyes: Anicteric, PERRL  Neck: Supple, trachea midline  Lungs:  Bilateral crackles  Heart: Regular rate and rhythm  Abdomen:  Soft, nontender,   Extremities: + peripheral edema.  Neurologic: Nonfocal, moving all four extremities  Skin: No lesions         Lab results: Basic Metabolic Panel: Recent Labs  Lab 01/06/18 1959 01/06/18 2333 01/07/18 0641  NA 137  --  137  K 4.6  --  4.7  CL 112*  --  112*  CO2 20*  --  16*  GLUCOSE 183*  --  135*  BUN 50*  --  57*  CREATININE 4.01* 3.86* 3.92*  CALCIUM 8.1*  --  8.2*    Liver Function Tests: Recent Labs  Lab 01/07/18 1215  AST 21  ALT 28  ALKPHOS 103  BILITOT 0.6  PROT 6.8  ALBUMIN 3.3*   No results for input(s): LIPASE, AMYLASE in the last 168 hours. No results for input(s): AMMONIA in the last 168 hours.  CBC: Recent Labs  Lab 01/06/18 1959 01/06/18 2333 01/07/18 0641  WBC 10.3 13.2* 12.8*  NEUTROABS 7.6  --   --   HGB 10.6* 10.6* 10.3*  HCT 32.5* 33.3* 32.1*  MCV 90.0 89.8 90.9  PLT 394 424* 416*    Cardiac Enzymes: Recent Labs  Lab 01/06/18 1959  01/06/18 2333 01/07/18 0641  TROPONINI 0.04* 0.04* 0.06*    BNP: Invalid input(s): POCBNP  CBG: Recent Labs  Lab 01/07/18 0734 01/07/18 1115  GLUCAP 144* 128*    Microbiology: Results for orders placed or performed during the hospital encounter of 01/06/18  MRSA PCR Screening     Status: None   Collection Time: 01/06/18 10:49 PM  Result Value Ref Range Status   MRSA by PCR NEGATIVE NEGATIVE Final    Comment:        The GeneXpert MRSA Assay (FDA approved for NASAL specimens only), is one component of a comprehensive MRSA colonization surveillance program. It is not intended to diagnose MRSA infection nor to guide or monitor treatment for MRSA infections. Performed at [facility], [address] [place], [place] [address]     Coagulation Studies: No results for input(s): LABPROT, INR in the last 72 hours.  Urinalysis: Recent Labs    01/07/18 1230  COLORURINE COLORLESS*  LABSPEC 1.006  PHURINE 5.0  GLUCOSEU [place]  PROTEINUR 100*  NITRITE NEGATIVE  LEUKOCYTESUR NEGATIVE      Imaging: Dg Chest [place] 1 View  Result Date: 01/06/2018 CLINICAL DATA:  Shortness of breath for 3 hours, oxygen desaturation, history COPD, diabetes mellitus, hypertension, former smoker EXAM: PORTABLE CHEST 1 VIEW COMPARISON:  Portable exam 1939 hours without priors for comparison FINDINGS: Enlargement of cardiac silhouette. Mediastinal contours and pulmonary vascularity normal. Lungs clear. No infiltrate, pleural effusion or pneumothorax. Bones demineralized. IMPRESSION: Enlargement of cardiac silhouette without acute infiltrate. Electronically Signed   By: Lavonia Dana M.D.   On: 01/06/2018 20:01      Assessment & Plan: Daniel Thomas is a 63 y.o. black male with diabetes mellitus type II, hypertension, diabetes mellitus type II, COPD, BPH, hyperlipidemia, who was admitted to [facility] on 01/06/2018 for acute renal failure  and hypertensive emergency. Required nicardipine gtt on admission.   Patient now transitioned to 2A.   1. Acute renal failure with metabolic acidosis on chronic kidney disease stage IV with proteinuria. Baseline creatinine 3.2 GFR of 23. Followed by [facility].  Acute renal failure seems secondary to acute cardiorenal syndrome.  Chronic kidney disease with nephrotic range proteinuria. Thought to be secondary to NSAIDs. Serologic work up negative in the past.  - Renal ultrasound from 10/2017 reviewed with patient.  - Reviewed chronic kidney disease and dialysis.  - Check serologic work up, urine studies. Discussed possibility of renal biopsy with patient.  - Patient's brother had multiple myeloma, will check SPEP/UPEP - Continue IV furosemide. Will need to be discharged on an loop diuretic.   2. Hypertension: with hypertensive emergency on admission. Home regimen of amlodipine, hydralazine, metoprolol, and chlorthalidone.  New onset diastolic congestive heart failure.  - Blood pressure has improved - amlodipine and metoprolol - IV furosemide.   3. Diabetes mellitus type II with chronic kidney disease: agree with discontinuation of metformin.   4. Anemia of chronic kidney disease: hemoglobin 10.3. Normocytic.    LOS: 1 [place] 1/5/20201:23 PM

## 2018-01-08 LAB — BASIC METABOLIC PANEL
Anion gap: 8 (ref 5–15)
BUN: 59 mg/dL — ABNORMAL HIGH (ref 8–23)
CO2: 19 mmol/L — ABNORMAL LOW (ref 22–32)
CREATININE: 4.39 mg/dL — AB (ref 0.61–1.24)
Calcium: 8.1 mg/dL — ABNORMAL LOW (ref 8.9–10.3)
Chloride: 110 mmol/L (ref 98–111)
GFR calc Af Amer: 16 mL/min — ABNORMAL LOW (ref >60)
GFR calc non Af Amer: 13 mL/min — ABNORMAL LOW (ref >60)
Glucose, Bld: 115 mg/dL — ABNORMAL HIGH (ref 70–99)
Potassium: 4.5 mmol/L (ref 3.5–5.1)
Sodium: 137 mmol/L (ref 135–145)

## 2018-01-08 LAB — GLUCOSE, CAPILLARY
Glucose-Capillary: 139 mg/dL — ABNORMAL HIGH (ref 70–99)
Glucose-Capillary: 110 mg/dL — ABNORMAL HIGH (ref 70–99)

## 2018-01-08 LAB — HIV ANTIBODY (ROUTINE TESTING W REFLEX): HIV Screen 4th Generation wRfx: NONREACTIVE

## 2018-01-08 LAB — PSA: Prostatic Specific Antigen: 19.56 ng/mL — ABNORMAL HIGH (ref 0.00–4.00)

## 2018-01-08 MED ORDER — TORSEMIDE 20 MG PO TABS: 40.0000 mg | ORAL_TABLET | Freq: Every day | ORAL | Status: DC

## 2018-01-08 MED ORDER — LINAGLIPTIN 5 MG PO TABS: 5.0000 mg | ORAL_TABLET | Freq: Every day | ORAL | Status: DC

## 2018-01-08 MED ORDER — METOPROLOL TARTRATE 50 MG PO TABS: 50.0000 mg | ORAL_TABLET | Freq: Two times a day (BID) | ORAL | 0 refills | Status: DC

## 2018-01-08 MED ORDER — HYDRALAZINE HCL 25 MG PO TABS: 25.0000 mg | ORAL_TABLET | Freq: Three times a day (TID) | ORAL | 0 refills | Status: DC

## 2018-01-08 MED ORDER — LINAGLIPTIN 5 MG PO TABS: 5.0000 mg | ORAL_TABLET | Freq: Every day | ORAL | 0 refills | Status: DC

## 2018-01-08 MED ORDER — HYDRALAZINE HCL 25 MG PO TABS: 25.0000 mg | ORAL_TABLET | Freq: Three times a day (TID) | ORAL | Status: DC

## 2018-01-08 MED ORDER — TORSEMIDE 20 MG PO TABS: ORAL_TABLET | ORAL | 0 refills | Status: DC

## 2018-01-08 MED ORDER — AMLODIPINE BESYLATE 10 MG PO TABS: 10.0000 mg | ORAL_TABLET | Freq: Every day | ORAL | 0 refills | Status: DC

## 2018-01-08 NOTE — Discharge Summary (Signed)
[facility] [place] NAME: Daniel Thomas    MR#:  [id number]  DATE OF BIRTH:  May 08, 1955  DATE OF ADMISSION:  01/06/2018 ADMITTING PHYSICIAN: Gorden Harms, MD  DATE OF DISCHARGE: 01/08/2018 11:50 AM  PRIMARY CARE PHYSICIAN: Marguerita Merles, MD    ADMISSION DIAGNOSIS:  End stage renal disease ([place]) [N18.6] Hypertensive urgency [I16.0] Dyspnea, unspecified type [R06.00]  DISCHARGE DIAGNOSIS:  Active Problems:   Malignant hypertension   SECONDARY DIAGNOSIS:   Past Medical History:  Diagnosis Date  . COPD (chronic obstructive pulmonary disease) ([place])   . Diabetes mellitus without complication ([place])   . Enlarged prostate   . Heart murmur   . High cholesterol   . Hypertension     HOSPITAL COURSE:   1.  Acute diastolic congestive heart failure.  Noncompliance with water pill as outpatient.  Patient was placed on 40 mg of IV Lasix twice a day.  ACE inhibitor contraindicated at this point with worsening kidney function.  Patient on metoprolol.  Lungs are clear upon discharge.  Off oxygen.  CHF clinic set up. 2.  Accelerated hypertension on presentation.  Initially ordered on a nicardipine drip.  Now on oral metoprolol, amlodipine.  I did start hydralazine and torsemide upon going home.  Hydrochlorothiazide is no longer a good medication for this patient with his kidney function at this point. 3.  Acute kidney disease on chronic kidney disease stage IV.  Metabolic acidosis and proteinuria.  Nephrology ordered serological studies.  Patient will follow-up with his nephrologist at [facility] as outpatient.  Patient should be in the planning stages for eventual dialysis.  Recommend checking a BMP and follow-up appointment. 4.  Type 2 diabetes mellitus.  Hemoglobin A1c 8.5.  I told him he is no longer a candidate for metformin or glipizide at this time.  Both these medications must be stopped.  I did start low-dose Tradjenta. 5.  Depression on Celexa 6.   Hyperlipidemia unspecified on pravastatin 7.  Elevated PSA in September 2019.  I went back and spoke with the patient about this.  The patient states he is supposed to have a prostate biopsy soon.  Follow-up as outpatient.  DISCHARGE CONDITIONS:   Satisfactory  CONSULTS OBTAINED:    DRUG ALLERGIES:  No Known Allergies  DISCHARGE MEDICATIONS:   Allergies as of 01/08/2018   No Known Allergies     Medication List    STOP taking these medications   carvedilol 12.5 MG tablet Commonly known as:  COREG   chlorthalidone 25 MG tablet Commonly known as:  HYGROTON   glipiZIDE 10 MG tablet Commonly known as:  GLUCOTROL   lisinopril-hydrochlorothiazide 10-12.5 MG tablet Commonly known as:  PRINZIDE,ZESTORETIC   lisinopril-hydrochlorothiazide 20-12.5 MG tablet Commonly known as:  PRINZIDE,ZESTORETIC   metFORMIN 1000 MG tablet Commonly known as:  GLUCOPHAGE   traZODone 50 MG tablet Commonly known as:  DESYREL     TAKE these medications   amLODipine 10 MG tablet Commonly known as:  NORVASC Take 1 tablet (10 mg total) by mouth daily.   aspirin EC 81 MG tablet 81 mg nightly.   citalopram 20 MG tablet Commonly known as:  CELEXA Take 20 mg by mouth daily.   hydrALAZINE 25 MG tablet Commonly known as:  APRESOLINE Take 1 tablet (25 mg total) by mouth every 8 (eight) hours. What changed:    medication strength  how much to take  when to take this   linagliptin 5 MG Tabs  tablet Commonly known as:  TRADJENTA Take 1 tablet (5 mg total) by mouth daily. Start taking on:  January 09, 2018   lovastatin 40 MG tablet Commonly known as:  MEVACOR 40 mg nightly.   metoprolol tartrate 50 MG tablet Commonly known as:  LOPRESSOR Take 1 tablet (50 mg total) by mouth 2 (two) times daily.   torsemide 20 MG tablet Commonly known as:  DEMADEX 2 tablets po daily   Vitamin D (Ergocalciferol) 1.25 MG (50000 UT) Caps capsule Commonly known as:  DRISDOL Take 50,000 Units by mouth  once a week.        DISCHARGE INSTRUCTIONS:   Follow-up PMD 5 days Follow-up your nephrologist 1 week  If you experience worsening of your admission symptoms, develop shortness of breath, life threatening emergency, suicidal or homicidal thoughts you must seek medical attention immediately by calling 911 or calling your MD immediately  if symptoms less severe.  You Must read complete instructions/literature along with all the possible adverse reactions/side effects for all the Medicines you take and that have been prescribed to you. Take any new Medicines after you have completely understood and accept all the possible adverse reactions/side effects.   Please note  You were cared for by a hospitalist during your hospital stay. If you have any questions about your discharge medications or the care you received while you were in the hospital after you are discharged, you can call the unit and asked to speak with the hospitalist on call if the hospitalist that took care of you is not available. Once you are discharged, your primary care physician will handle any further medical issues. Please note that NO REFILLS for any discharge medications will be authorized once you are discharged, as it is imperative that you return to your primary care physician (or establish a relationship with a primary care physician if you do not have one) for your aftercare needs so that they can reassess your need for medications and monitor your lab values.    Today   CHIEF COMPLAINT:  Shortness of breath  HISTORY OF PRESENT ILLNESS:  Daniel Thomas  is a 63 y.o. male with a known history of chronic kidney disease presented with shortness of breath and found to be in CHF.   VITAL SIGNS:  Blood pressure (!) 142/71, pulse 83, temperature 98.3 F (36.8 C), temperature source Oral, resp. rate 19, height 4\' 11"  (1.499 m), weight 75.5 kg, SpO2 97 %.   PHYSICAL EXAMINATION:  GENERAL:  63 y.o.-year-old patient  lying in the bed with no acute distress.  EYES: Pupils equal, round, reactive to light and accommodation. No scleral icterus. Extraocular muscles intact.  HEENT: Head atraumatic, normocephalic. Oropharynx and nasopharynx clear.  NECK:  Supple, no jugular venous distention. No thyroid enlargement, no tenderness.  LUNGS: Normal breath sounds bilaterally, no wheezing, rales,rhonchi or crepitation. No use of accessory muscles of respiration.  CARDIOVASCULAR: S1, S2 normal. No murmurs, rubs, or gallops.  ABDOMEN: Soft, non-tender, non-distended. Bowel sounds present. No organomegaly or mass.  EXTREMITIES: No pedal edema, cyanosis, or clubbing.  NEUROLOGIC: Cranial nerves II through XII are intact. Muscle strength 5/5 in all extremities. Sensation intact. Gait not checked.  PSYCHIATRIC: The patient is alert and oriented x 3.  SKIN: No obvious rash, lesion, or ulcer.   DATA REVIEW:   CBC Recent Labs  Lab 01/07/18 0641  WBC 12.8*  HGB 10.3*  HCT 32.1*  PLT 416*    Chemistries  Recent Labs  Lab 01/07/18 1215  01/08/18 0515  NA  --  137  K  --  4.5  CL  --  110  CO2  --  19*  GLUCOSE  --  115*  BUN  --  59*  CREATININE  --  4.39*  CALCIUM  --  8.1*  AST 21  --   ALT 28  --   ALKPHOS 103  --   BILITOT 0.6  --     Cardiac Enzymes Recent Labs  Lab 01/07/18 0641  TROPONINI 0.06*    Microbiology Results  Results for orders placed or performed during the hospital encounter of 01/06/18  MRSA PCR Screening     Status: None   Collection Time: 01/06/18 10:49 PM  Result Value Ref Range Status   MRSA by PCR NEGATIVE NEGATIVE Final    Comment:        The GeneXpert MRSA Assay (FDA approved for NASAL specimens only), is one component of a comprehensive MRSA colonization surveillance program. It is not intended to diagnose MRSA infection nor to guide or monitor treatment for MRSA infections. Performed at [facility], [address] [place], [place] [address]      RADIOLOGY:  Dg Chest Port 1 View  Result Date: 01/06/2018 CLINICAL DATA:  Shortness of breath for 3 hours, oxygen desaturation, history COPD, diabetes mellitus, hypertension, former smoker EXAM: PORTABLE CHEST 1 VIEW COMPARISON:  Portable exam 1939 hours without priors for comparison FINDINGS: Enlargement of cardiac silhouette. Mediastinal contours and pulmonary vascularity normal. Lungs clear. No infiltrate, pleural effusion or pneumothorax. Bones demineralized. IMPRESSION: Enlargement of cardiac silhouette without acute infiltrate. Electronically Signed   By: Lavonia Dana M.D.   On: 01/06/2018 20:01     Management plans discussed with the patient, family and they are in agreement.  CODE STATUS:  Code Status History    Date Active Date Inactive Code Status Order ID Comments User Context   01/06/2018 2228 01/08/2018 1455 Full Code [id number]  Salary, Avel Peace, MD ED      TOTAL TIME TAKING CARE OF THIS PATIENT: 35 minutes.    Loletha Grayer M.D on 01/08/2018 at 3:49 PM  Between 7am to 6pm - Pager - [phone]  After 6pm go to [email or web address] - password [email or web address] [facility]  [phone]  CC: Primary care physician; Marguerita Merles, MD

## 2018-01-08 NOTE — Progress Notes (Signed)
[place], [place] 01/08/18  Subjective:  Patient feels better today.  Currently on room air.  Able to eat without nausea or vomiting.  States breathing feels improved Serum creatinine is a bit higher today at 4.4  Objective:  Vital signs in last 24 hours:  Temp:  [98.1 F (36.7 C)-98.5 F (36.9 C)] 98.3 F (36.8 C) (01/06 0755) Pulse Rate:  [80-93] 91 (01/06 0755) Resp:  [18-20] 19 (01/06 0755) BP: (133-171)/(71-90) 171/85 (01/06 0755) SpO2:  [95 %-100 %] 96 % (01/06 0755) Weight:  [75.5 kg] 75.5 kg (01/06 0405)  Weight change: -0.272 kg Filed Weights   01/06/18 1950 01/08/18 0405  Weight: 75.8 kg 75.5 kg    Intake/Output:    Intake/Output Summary (Last 24 hours) at 01/08/2018 1022 Last data filed at 01/08/2018 0158 Gross per 24 hour  Intake 1315 ml  Output 320 ml  Net 995 ml     Physical Exam: General:  No acute distress, laying in the bed  HEENT  moist oral mucous membranes  Neck  supple  Pulm/lungs  room air, normal effort, clear to auscultation  CVS/Heart  regular, no rub  Abdomen:   Soft, nontender  Extremities:  Trace edema  Neurologic:  Alert, oriented  Skin:  No acute rashes          Basic Metabolic Panel:  Recent Labs  Lab 01/06/18 1959 01/06/18 2333 01/07/18 0641 01/08/18 0515  NA 137  --  137 137  K 4.6  --  4.7 4.5  CL 112*  --  112* 110  CO2 20*  --  16* 19*  GLUCOSE 183*  --  135* 115*  BUN 50*  --  57* 59*  CREATININE 4.01* 3.86* 3.92* 4.39*  CALCIUM 8.1*  --  8.2* 8.1*     CBC: Recent Labs  Lab 01/06/18 1959 01/06/18 2333 01/07/18 0641  WBC 10.3 13.2* 12.8*  NEUTROABS 7.6  --   --   HGB 10.6* 10.6* 10.3*  HCT 32.5* 33.3* 32.1*  MCV 90.0 89.8 90.9  PLT 394 424* 416*     No results found for: HEPBSAG, HEPBSAB, HEPBIGM    Microbiology:  Recent Results (from the past 240 hour(s))  MRSA PCR Screening     Status: None   Collection Time: 01/06/18 10:49 PM  Result Value Ref Range Status   MRSA by PCR NEGATIVE NEGATIVE Final    Comment:        The GeneXpert MRSA Assay (FDA approved for NASAL specimens only), is one component of a comprehensive MRSA colonization surveillance program. It is not intended to diagnose MRSA infection nor to guide or monitor treatment for MRSA infections. Performed at [facility], [address] [place], [place] [address]     Coagulation Studies: No results for input(s): LABPROT, INR in the last 72 hours.  Urinalysis: Recent Labs    01/07/18 [place] 1.006  PHURINE 5.0  GLUCOSEU 50*  HGBUR SMALL*  BILIRUBINUR NEGATIVE  KETONESUR NEGATIVE  PROTEINUR 100*  NITRITE NEGATIVE  LEUKOCYTESUR NEGATIVE      Imaging: Dg Chest Port 1 View  Result Date: 01/06/2018 CLINICAL DATA:  Shortness of breath for 3 hours, oxygen desaturation, history COPD, diabetes mellitus, hypertension, former smoker EXAM: PORTABLE CHEST 1 VIEW COMPARISON:  Portable exam 1939 hours without priors for comparison FINDINGS: Enlargement of cardiac silhouette. Mediastinal contours and pulmonary vascularity normal. Lungs clear. No infiltrate, pleural effusion or pneumothorax. Bones demineralized. IMPRESSION: Enlargement of cardiac  silhouette without acute infiltrate. Electronically Signed   By: Lavonia Dana M.D.   On: 01/06/2018 20:01     Medications:   . sodium chloride     . albuterol  2.5 mg Nebulization Q6H  . amLODipine  10 mg Oral Daily  . aspirin EC  81 mg Oral Daily  . citalopram  20 mg Oral Daily  . heparin  5,000 Units Subcutaneous Q8H  . hydrALAZINE  25 mg Oral Q8H  . insulin aspart  0-20 Units Subcutaneous TID WC  . insulin aspart  0-5 Units Subcutaneous QHS  . [START ON 01/09/2018] linagliptin  5 mg Oral Daily  . metoprolol tartrate  50 mg Oral BID  . pravastatin  80 mg Oral q1800  . sodium chloride flush  3 mL Intravenous Q12H  . torsemide  40 mg Oral Daily   sodium chloride, acetaminophen **OR**  acetaminophen, hydrALAZINE, ondansetron **OR** ondansetron (ZOFRAN) IV, polyethylene glycol, sodium chloride flush  Assessment/ Plan:  63 y.o. African-American male with diabetes mellitus type II, hypertension, diabetes mellitus type II, COPD, BPH, hyperlipidemia, who was admitted to [facility] on 01/06/2018 for acute renal failure and hypertensive emergency. Required nicardipine gtt on admission.   1.  Acute renal failure, chronic kidney disease stage IV.  Baseline creatinine 3.2/GFR 23 (October 18, 2017) 2.  Metabolic acidosis 3.  Proteinuria 4.  Hypertension with CKD 5.  Diabetes type 2 with chronic kidney disease 6.  Secondary hyperparathyroidism.  PTH 214 on September 15, 2017  Patient had come in for volume overload and uncontrolled hypertension.  This morning, clinically he feels better and wants to be discharged.  Blood pressure this morning 171/85.  Serum creatinine slightly higher today. Patient will be discharged with hydralazine and torsemide and close follow-up next week.  He states that he already has a follow-up appointment scheduled.  Hold ACE inhibitor until serum creatinine stabilizes.  Can be restarted as outpatient.  Recent hemoglobin A1c 8.5% on January 07, 2018.  We discussed about adhering to low-salt, low sugar diet.  We discussed to avoid eating out    LOS: 2 Daniel Thomas 1/6/202010:22 AM  [place], [place]  Note: This note was prepared with Dragon dictation. Any transcription errors are unintentional

## 2018-01-08 NOTE — Progress Notes (Signed)
Pt discharged home with family, prescription information and medication regiment education provided along with follow up appointment schedule. VSS, no concerns at this time family and patient state that sister will help patient to adhere to medication regimen and follow up appointments.

## 2018-01-08 NOTE — Discharge Instructions (Addendum)
Looking back at labs 09/2017  Had elevated psa.  He states he has follow up with urology for prostate biopsy.

## 2018-01-09 LAB — PROTEIN ELECTRO, RANDOM URINE
Albumin ELP, Urine: 71.5 %
Alpha-1-Globulin, U: 1.5 %
Alpha-2-Globulin, U: 8.2 %
BETA GLOBULIN, U: 9.4 %
Gamma Globulin, U: 9.4 %
TOTAL PROTEIN, URINE-UPE24: 140.2 mg/dL

## 2018-01-09 LAB — HEPATITIS B SURFACE ANTIGEN: Hepatitis B Surface Ag: NEGATIVE

## 2018-01-09 LAB — PROTEIN ELECTROPHORESIS, SERUM
A/G Ratio: 1 (ref 0.7–1.7)
Albumin ELP: 3.1 g/dL (ref 2.9–4.4)
Alpha-1-Globulin: 0.3 g/dL (ref 0.0–0.4)
Alpha-2-Globulin: 1 g/dL (ref 0.4–1.0)
Beta Globulin: 0.8 g/dL (ref 0.7–1.3)
Gamma Globulin: 0.8 g/dL (ref 0.4–1.8)
Globulin, Total: 3 g/dL (ref 2.2–3.9)
Total Protein ELP: 6.1 g/dL (ref 6.0–8.5)

## 2018-01-09 LAB — HIV ANTIBODY (ROUTINE TESTING W REFLEX): HIV Screen 4th Generation wRfx: NONREACTIVE

## 2018-01-09 LAB — HEPATITIS C ANTIBODY: HCV Ab: <0.1 s/co ratio (ref 0.0–0.9)

## 2018-01-09 LAB — C3 COMPLEMENT: C3 Complement: 136 mg/dL (ref 82–167)

## 2018-01-09 LAB — C4 COMPLEMENT: Complement C4, Body Fluid: 51 mg/dL — ABNORMAL HIGH (ref 14–44)

## 2018-01-09 LAB — MPO/PR-3 (ANCA) ANTIBODIES: ANCA Proteinase 3: <3.5 U/mL (ref 0.0–3.5)

## 2018-01-09 LAB — GLOMERULAR BASEMENT MEMBRANE ANTIBODIES: GBM Ab: 3 units (ref 0–20)

## 2018-01-09 LAB — KAPPA/LAMBDA LIGHT CHAINS
Kappa free light chain: 76 mg/L — ABNORMAL HIGH (ref 3.3–19.4)
Kappa, lambda light chain ratio: 1.75 — ABNORMAL HIGH (ref 0.26–1.65)
Lambda free light chains: 43.4 mg/L — ABNORMAL HIGH (ref 5.7–26.3)

## 2018-01-09 LAB — ANA W/REFLEX: Anti Nuclear Antibody(ANA): NEGATIVE

## 2018-01-09 LAB — HEPATITIS B CORE ANTIBODY, IGM: HEP B C IGM: NEGATIVE

## 2018-01-09 LAB — HEPATITIS B SURFACE ANTIBODY,QUALITATIVE: Hep B S Ab: NONREACTIVE

## 2018-01-12 ENCOUNTER — Encounter
Admission: EM | Disposition: A | Discharge status: Home / Self Care | Payer: Self-pay | Source: Home / Self Care | Attending: Specialist

## 2018-01-12 ENCOUNTER — Emergency Department: Payer: BLUE CROSS/BLUE SHIELD

## 2018-01-12 ENCOUNTER — Inpatient Hospital Stay
Admission: EM | Admit: 2018-01-12 | Discharge: 2018-01-16 | DRG: 674 | Disposition: A | Discharge status: Home / Self Care | Payer: BLUE CROSS/BLUE SHIELD | Attending: Specialist | Admitting: Specialist

## 2018-01-12 ENCOUNTER — Other Ambulatory Visit: Payer: Self-pay

## 2018-01-12 DIAGNOSIS — J449 Chronic obstructive pulmonary disease, unspecified: Secondary | ICD-10-CM | POA: Diagnosis present

## 2018-01-12 DIAGNOSIS — I509 Heart failure, unspecified: Secondary | ICD-10-CM

## 2018-01-12 DIAGNOSIS — N189 Chronic kidney disease, unspecified: Secondary | ICD-10-CM

## 2018-01-12 DIAGNOSIS — N184 Chronic kidney disease, stage 4 (severe): Secondary | ICD-10-CM | POA: Diagnosis present

## 2018-01-12 DIAGNOSIS — E7800 Pure hypercholesterolemia, unspecified: Secondary | ICD-10-CM | POA: Diagnosis present

## 2018-01-12 DIAGNOSIS — Z79899 Other long term (current) drug therapy: Secondary | ICD-10-CM | POA: Diagnosis not present

## 2018-01-12 DIAGNOSIS — N186 End stage renal disease: Secondary | ICD-10-CM | POA: Diagnosis not present

## 2018-01-12 DIAGNOSIS — N2581 Secondary hyperparathyroidism of renal origin: Secondary | ICD-10-CM | POA: Diagnosis present

## 2018-01-12 DIAGNOSIS — E875 Hyperkalemia: Secondary | ICD-10-CM | POA: Diagnosis present

## 2018-01-12 DIAGNOSIS — E1122 Type 2 diabetes mellitus with diabetic chronic kidney disease: Secondary | ICD-10-CM | POA: Diagnosis present

## 2018-01-12 DIAGNOSIS — Z87891 Personal history of nicotine dependence: Secondary | ICD-10-CM | POA: Diagnosis not present

## 2018-01-12 DIAGNOSIS — E785 Hyperlipidemia, unspecified: Secondary | ICD-10-CM | POA: Diagnosis present

## 2018-01-12 DIAGNOSIS — Z7984 Long term (current) use of oral hypoglycemic drugs: Secondary | ICD-10-CM | POA: Diagnosis not present

## 2018-01-12 DIAGNOSIS — I5032 Chronic diastolic (congestive) heart failure: Secondary | ICD-10-CM | POA: Diagnosis present

## 2018-01-12 DIAGNOSIS — Z992 Dependence on renal dialysis: Secondary | ICD-10-CM

## 2018-01-12 DIAGNOSIS — N400 Enlarged prostate without lower urinary tract symptoms: Secondary | ICD-10-CM | POA: Diagnosis present

## 2018-01-12 DIAGNOSIS — E119 Type 2 diabetes mellitus without complications: Secondary | ICD-10-CM

## 2018-01-12 DIAGNOSIS — N179 Acute kidney failure, unspecified: Principal | ICD-10-CM | POA: Diagnosis present

## 2018-01-12 DIAGNOSIS — I130 Hypertensive heart and chronic kidney disease with heart failure and stage 1 through stage 4 chronic kidney disease, or unspecified chronic kidney disease: Secondary | ICD-10-CM | POA: Diagnosis present

## 2018-01-12 DIAGNOSIS — F329 Major depressive disorder, single episode, unspecified: Secondary | ICD-10-CM | POA: Diagnosis present

## 2018-01-12 DIAGNOSIS — R0602 Shortness of breath: Secondary | ICD-10-CM | POA: Diagnosis present

## 2018-01-12 HISTORY — PX: DIALYSIS/PERMA CATHETER INSERTION: CATH118288

## 2018-01-12 LAB — CBC WITH DIFFERENTIAL/PLATELET
Abs Immature Granulocytes: 0.05 10*3/uL (ref 0.00–0.07)
BASOS PCT: 1 %
Basophils Absolute: 0.1 10*3/uL (ref 0.0–0.1)
Eosinophils Absolute: 0.2 10*3/uL (ref 0.0–0.5)
Eosinophils Relative: 2 %
HCT: 32.9 % — ABNORMAL LOW (ref 39.0–52.0)
Hemoglobin: 10.5 g/dL — ABNORMAL LOW (ref 13.0–17.0)
Immature Granulocytes: 1 %
Lymphocytes Relative: 17 %
Lymphs Abs: 1.8 10*3/uL (ref 0.7–4.0)
MCH: 28.7 pg (ref 26.0–34.0)
MCHC: 31.9 g/dL (ref 30.0–36.0)
MCV: 89.9 fL (ref 80.0–100.0)
Monocytes Absolute: 1 10*3/uL (ref 0.1–1.0)
Monocytes Relative: 10 %
Neutro Abs: 7.4 10*3/uL (ref 1.7–7.7)
Neutrophils Relative %: 69 %
Platelets: 464 10*3/uL — ABNORMAL HIGH (ref 150–400)
RBC: 3.66 MIL/uL — AB (ref 4.22–5.81)
RDW: 13.9 % (ref 11.5–15.5)
WBC: 10.5 10*3/uL (ref 4.0–10.5)
nRBC: 0 % (ref 0.0–0.2)

## 2018-01-12 LAB — BASIC METABOLIC PANEL
Anion gap: 8 (ref 5–15)
BUN: 89 mg/dL — ABNORMAL HIGH (ref 8–23)
CO2: 20 mmol/L — ABNORMAL LOW (ref 22–32)
Calcium: 8.6 mg/dL — ABNORMAL LOW (ref 8.9–10.3)
Chloride: 106 mmol/L (ref 98–111)
Creatinine, Ser: 4.49 mg/dL — ABNORMAL HIGH (ref 0.61–1.24)
GFR calc Af Amer: 15 mL/min — ABNORMAL LOW (ref >60)
GFR calc non Af Amer: 13 mL/min — ABNORMAL LOW (ref >60)
Glucose, Bld: 142 mg/dL — ABNORMAL HIGH (ref 70–99)
Potassium: 5.9 mmol/L — ABNORMAL HIGH (ref 3.5–5.1)
Sodium: 134 mmol/L — ABNORMAL LOW (ref 135–145)

## 2018-01-12 LAB — TROPONIN I: Troponin I: 0.03 ng/mL (ref <0.03)

## 2018-01-12 LAB — INFLUENZA PANEL BY PCR (TYPE A & B)
Influenza A By PCR: NEGATIVE
Influenza B By PCR: NEGATIVE

## 2018-01-12 LAB — GLUCOSE, CAPILLARY
GLUCOSE-CAPILLARY: 150 mg/dL — AB (ref 70–99)
GLUCOSE-CAPILLARY: 303 mg/dL — AB (ref 70–99)
Glucose-Capillary: 55 mg/dL — ABNORMAL LOW (ref 70–99)
Glucose-Capillary: 240 mg/dL — ABNORMAL HIGH (ref 70–99)

## 2018-01-12 LAB — BRAIN NATRIURETIC PEPTIDE: B Natriuretic Peptide: 396 pg/mL — ABNORMAL HIGH (ref 0.0–100.0)

## 2018-01-12 SURGERY — DIALYSIS/PERMA CATHETER INSERTION
Anesthesia: Moderate Sedation

## 2018-01-12 MED ORDER — CEFAZOLIN SODIUM-DEXTROSE 1-4 GM/50ML-% IV SOLN
INTRAVENOUS | Status: AC
  Filled 2018-01-12: qty 50

## 2018-01-12 MED ORDER — SODIUM CHLORIDE 0.9 % IV SOLN: 250.0000 mL | INTRAVENOUS | Status: DC | PRN

## 2018-01-12 MED ORDER — HEPARIN SODIUM (PORCINE) 10000 UNIT/ML IJ SOLN
INTRAMUSCULAR | Status: AC
  Filled 2018-01-12: qty 1

## 2018-01-12 MED ORDER — CHLORHEXIDINE GLUCONATE CLOTH 2 % EX PADS
6.0000 | MEDICATED_PAD | Freq: Every day | CUTANEOUS | Status: DC
  Administered 2018-01-13 – 2018-01-16 (×4): 6 via TOPICAL
  Filled 2018-01-12: qty 6

## 2018-01-12 MED ORDER — AMLODIPINE BESYLATE 10 MG PO TABS
10.0000 mg | ORAL_TABLET | Freq: Every day | ORAL | Status: DC
  Administered 2018-01-12 – 2018-01-16 (×5): 10 mg via ORAL
  Filled 2018-01-12 (×5): qty 1

## 2018-01-12 MED ORDER — SODIUM CHLORIDE 0.9% FLUSH
3.0000 mL | Freq: Two times a day (BID) | INTRAVENOUS | Status: DC
  Administered 2018-01-12 – 2018-01-16 (×7): 3 mL via INTRAVENOUS

## 2018-01-12 MED ORDER — CITALOPRAM HYDROBROMIDE 20 MG PO TABS
20.0000 mg | ORAL_TABLET | Freq: Every day | ORAL | Status: DC
  Administered 2018-01-12 – 2018-01-16 (×5): 20 mg via ORAL
  Filled 2018-01-12 (×5): qty 1

## 2018-01-12 MED ORDER — FUROSEMIDE 10 MG/ML IJ SOLN
20.0000 mg | Freq: Once | INTRAMUSCULAR | Status: AC
  Administered 2018-01-12: 20 mg via INTRAVENOUS
  Filled 2018-01-12: qty 4

## 2018-01-12 MED ORDER — IPRATROPIUM-ALBUTEROL 0.5-2.5 (3) MG/3ML IN SOLN
3.0000 mL | Freq: Once | RESPIRATORY_TRACT | Status: AC
  Administered 2018-01-12: 3 mL via RESPIRATORY_TRACT

## 2018-01-12 MED ORDER — LIDOCAINE-EPINEPHRINE (PF) 1 %-1:200000 IJ SOLN
INTRAMUSCULAR | Status: AC
  Filled 2018-01-12: qty 10

## 2018-01-12 MED ORDER — FENTANYL CITRATE (PF) 100 MCG/2ML IJ SOLN
INTRAMUSCULAR | Status: DC | PRN
  Administered 2018-01-12 (×2): 25 ug via INTRAVENOUS

## 2018-01-12 MED ORDER — INSULIN ASPART 100 UNIT/ML ~~LOC~~ SOLN
0.0000 [IU] | Freq: Every day | SUBCUTANEOUS | Status: DC
  Administered 2018-01-12: 2 [IU] via SUBCUTANEOUS
  Filled 2018-01-12: qty 1

## 2018-01-12 MED ORDER — IPRATROPIUM-ALBUTEROL 0.5-2.5 (3) MG/3ML IN SOLN
3.0000 mL | Freq: Once | RESPIRATORY_TRACT | Status: AC
  Administered 2018-01-12: 3 mL via RESPIRATORY_TRACT
  Filled 2018-01-12: qty 3

## 2018-01-12 MED ORDER — SODIUM CHLORIDE 0.9% FLUSH
3.0000 mL | INTRAVENOUS | Status: DC | PRN
  Administered 2018-01-12 – 2018-01-13 (×2): 3 mL via INTRAVENOUS
  Filled 2018-01-12 (×2): qty 3

## 2018-01-12 MED ORDER — PRAVASTATIN SODIUM 20 MG PO TABS
10.0000 mg | ORAL_TABLET | Freq: Every day | ORAL | Status: DC
  Administered 2018-01-12 – 2018-01-16 (×5): 10 mg via ORAL
  Filled 2018-01-12 (×5): qty 1

## 2018-01-12 MED ORDER — SODIUM ZIRCONIUM CYCLOSILICATE 10 G PO PACK
10.0000 g | PACK | Freq: Once | ORAL | Status: AC
  Administered 2018-01-12: 10 g via ORAL
  Filled 2018-01-12: qty 1

## 2018-01-12 MED ORDER — MIDAZOLAM HCL 5 MG/5ML IJ SOLN
INTRAMUSCULAR | Status: AC
  Filled 2018-01-12: qty 5

## 2018-01-12 MED ORDER — ONDANSETRON HCL 4 MG PO TABS: 4.0000 mg | ORAL_TABLET | Freq: Four times a day (QID) | ORAL | Status: DC | PRN

## 2018-01-12 MED ORDER — ACETAMINOPHEN 325 MG PO TABS
650.0000 mg | ORAL_TABLET | Freq: Four times a day (QID) | ORAL | Status: DC | PRN
  Administered 2018-01-13 – 2018-01-15 (×2): 650 mg via ORAL
  Filled 2018-01-12 (×2): qty 2

## 2018-01-12 MED ORDER — IPRATROPIUM-ALBUTEROL 0.5-2.5 (3) MG/3ML IN SOLN
3.0000 mL | Freq: Once | RESPIRATORY_TRACT | Status: DC
  Filled 2018-01-12: qty 3

## 2018-01-12 MED ORDER — CEFAZOLIN SODIUM-DEXTROSE 2-4 GM/100ML-% IV SOLN
2.0000 g | INTRAVENOUS | Status: AC
  Administered 2018-01-12: 2 g via INTRAVENOUS

## 2018-01-12 MED ORDER — SENNOSIDES-DOCUSATE SODIUM 8.6-50 MG PO TABS
1.0000 | ORAL_TABLET | Freq: Every evening | ORAL | Status: DC | PRN
  Administered 2018-01-14 – 2018-01-15 (×2): 1 via ORAL
  Filled 2018-01-12 (×2): qty 1

## 2018-01-12 MED ORDER — TUBERCULIN PPD 5 UNIT/0.1ML ID SOLN
5.0000 [IU] | Freq: Once | INTRADERMAL | Status: AC
  Administered 2018-01-12: 5 [IU] via INTRADERMAL
  Filled 2018-01-12: qty 0.1

## 2018-01-12 MED ORDER — HEPARIN SODIUM (PORCINE) 5000 UNIT/ML IJ SOLN
5000.0000 [IU] | Freq: Three times a day (TID) | INTRAMUSCULAR | Status: DC
  Administered 2018-01-12 – 2018-01-16 (×10): 5000 [IU] via SUBCUTANEOUS
  Filled 2018-01-12 (×10): qty 1

## 2018-01-12 MED ORDER — HYDRALAZINE HCL 25 MG PO TABS
25.0000 mg | ORAL_TABLET | Freq: Three times a day (TID) | ORAL | Status: DC
  Administered 2018-01-12 – 2018-01-16 (×13): 25 mg via ORAL
  Filled 2018-01-12 (×13): qty 1

## 2018-01-12 MED ORDER — DEXTROSE 50 % IV SOLN
1.0000 | Freq: Once | INTRAVENOUS | Status: AC
  Administered 2018-01-12: 50 mL via INTRAVENOUS
  Filled 2018-01-12: qty 50

## 2018-01-12 MED ORDER — MIDAZOLAM HCL 2 MG/2ML IJ SOLN
INTRAMUSCULAR | Status: DC | PRN
  Administered 2018-01-12 (×2): 1 mg via INTRAVENOUS

## 2018-01-12 MED ORDER — INSULIN ASPART 100 UNIT/ML ~~LOC~~ SOLN
10.0000 [IU] | Freq: Once | SUBCUTANEOUS | Status: AC
  Administered 2018-01-12: 10 [IU] via INTRAVENOUS
  Filled 2018-01-12: qty 1

## 2018-01-12 MED ORDER — TORSEMIDE 20 MG PO TABS
100.0000 mg | ORAL_TABLET | Freq: Every day | ORAL | Status: DC
  Administered 2018-01-12: 100 mg via ORAL
  Filled 2018-01-12: qty 5

## 2018-01-12 MED ORDER — VITAMIN D (ERGOCALCIFEROL) 1.25 MG (50000 UNIT) PO CAPS
50000.0000 [IU] | ORAL_CAPSULE | ORAL | Status: DC
  Administered 2018-01-12: 50000 [IU] via ORAL
  Filled 2018-01-12: qty 1

## 2018-01-12 MED ORDER — ONDANSETRON HCL 4 MG/2ML IJ SOLN: 4.0000 mg | Freq: Four times a day (QID) | INTRAMUSCULAR | Status: DC | PRN

## 2018-01-12 MED ORDER — DEXTROSE 50 % IV SOLN
1.0000 | Freq: Once | INTRAVENOUS | Status: AC
  Administered 2018-01-12: 50 mL via INTRAVENOUS

## 2018-01-12 MED ORDER — ACETAMINOPHEN 650 MG RE SUPP: 650.0000 mg | Freq: Four times a day (QID) | RECTAL | Status: DC | PRN

## 2018-01-12 MED ORDER — HYDRALAZINE HCL 20 MG/ML IJ SOLN: 10.0000 mg | Freq: Four times a day (QID) | INTRAMUSCULAR | Status: DC | PRN

## 2018-01-12 MED ORDER — FENTANYL CITRATE (PF) 100 MCG/2ML IJ SOLN
INTRAMUSCULAR | Status: AC
  Filled 2018-01-12: qty 2

## 2018-01-12 MED ORDER — INSULIN ASPART 100 UNIT/ML ~~LOC~~ SOLN
0.0000 [IU] | Freq: Three times a day (TID) | SUBCUTANEOUS | Status: DC
  Administered 2018-01-12: 11 [IU] via SUBCUTANEOUS
  Administered 2018-01-13: 2 [IU] via SUBCUTANEOUS
  Administered 2018-01-13: 3 [IU] via SUBCUTANEOUS
  Administered 2018-01-14: 2 [IU] via SUBCUTANEOUS
  Administered 2018-01-14: 3 [IU] via SUBCUTANEOUS
  Administered 2018-01-14 – 2018-01-15 (×2): 2 [IU] via SUBCUTANEOUS
  Administered 2018-01-15: 8 [IU] via SUBCUTANEOUS
  Administered 2018-01-16: 2 [IU] via SUBCUTANEOUS
  Administered 2018-01-16: 5 [IU] via SUBCUTANEOUS
  Administered 2018-01-16: 3 [IU] via SUBCUTANEOUS
  Filled 2018-01-12 (×11): qty 1

## 2018-01-12 MED ORDER — METHYLPREDNISOLONE SODIUM SUCC 125 MG IJ SOLR
125.0000 mg | Freq: Once | INTRAMUSCULAR | Status: AC
  Administered 2018-01-12: 125 mg via INTRAVENOUS
  Filled 2018-01-12: qty 2

## 2018-01-12 MED ORDER — METOPROLOL TARTRATE 50 MG PO TABS
50.0000 mg | ORAL_TABLET | Freq: Two times a day (BID) | ORAL | Status: DC
  Administered 2018-01-12 – 2018-01-16 (×8): 50 mg via ORAL
  Filled 2018-01-12 (×8): qty 1

## 2018-01-12 MED ORDER — PATIROMER SORBITEX CALCIUM 8.4 G PO PACK
8.4000 g | PACK | Freq: Every day | ORAL | Status: DC
  Administered 2018-01-12: 8.4 g via ORAL
  Filled 2018-01-12 (×2): qty 1

## 2018-01-12 MED ORDER — PATIROMER SORBITEX CALCIUM 8.4 G PO PACK
8.4000 g | PACK | Freq: Every day | ORAL | Status: DC
  Filled 2018-01-12 (×2): qty 1

## 2018-01-12 MED ORDER — DEXTROSE 50 % IV SOLN
INTRAVENOUS | Status: AC
  Administered 2018-01-12: 50 mL via INTRAVENOUS
  Filled 2018-01-12: qty 50

## 2018-01-12 MED ORDER — HEPARIN (PORCINE) IN NACL 1000-0.9 UT/500ML-% IV SOLN
INTRAVENOUS | Status: AC
  Filled 2018-01-12: qty 500

## 2018-01-12 MED ORDER — SODIUM BICARBONATE 8.4 % IV SOLN
50.0000 meq | Freq: Once | INTRAVENOUS | Status: AC
  Administered 2018-01-12: 50 meq via INTRAVENOUS
  Filled 2018-01-12: qty 50

## 2018-01-12 SURGICAL SUPPLY — 10 items
CATH PALINDROME RT-P 15FX19CM (CATHETERS) ×2 IMPLANT
COVER PROBE U/S 5X48 (MISCELLANEOUS) ×2 IMPLANT
DERMABOND ADVANCED (GAUZE/BANDAGES/DRESSINGS) ×2
DERMABOND ADVANCED .7 DNX12 (GAUZE/BANDAGES/DRESSINGS) IMPLANT
DRAPE INCISE IOBAN 66X45 STRL (DRAPES) ×2 IMPLANT
NDL ENTRY 21GA 7CM ECHOTIP (NEEDLE) IMPLANT
NEEDLE ENTRY 21GA 7CM ECHOTIP (NEEDLE) ×3 IMPLANT
PACK ANGIOGRAPHY (CUSTOM PROCEDURE TRAY) ×2 IMPLANT
SET INTRO CAPELLA COAXIAL (SET/KITS/TRAYS/PACK) ×2 IMPLANT
SUT MNCRL AB 4-0 PS2 18 (SUTURE) ×2 IMPLANT

## 2018-01-12 NOTE — Progress Notes (Addendum)
Inpatient Diabetes Program Recommendations  AACE/ADA: New Consensus Statement on Inpatient Glycemic Control  Target Ranges:  Prepandial:   less than 140 mg/dL      Peak postprandial:   less than 180 mg/dL (1-2 hours)      Critically ill patients:  140 - 180 mg/dL   Results for Daniel Thomas, Daniel Thomas (MRN [id number]) as of 01/12/2018 10:54  Ref. Range 01/12/2018 9:04 01/12/2018 10:45  Glucose-Capillary Latest Ref Range: 70 - 99 mg/dL  Novolog 10 units  D50 50 ml   Solumedrol 125 mg 55 (L)   Results for Daniel Thomas, Daniel Thomas (MRN [id number]) as of 01/12/2018 10:54  Ref. Range 01/12/2018 06:50  Glucose Latest Ref Range: 70 - 99 mg/dL 142 (H)   Review of Glycemic Control  Diabetes history: DM2 Outpatient Diabetes medications: Tradjenta 5 mg daily Current orders for Inpatient glycemic control: Novolog 0-15 units TID with meals, Novolog 0-5 units QHS, Tradjenta 5 mg daily (Novolog and Tradjenta ordered under signed and held orders at this time)  Inpatient Diabetes Program Recommendations:  Oral Agents: Please discontinue Tradjenta while inpatient.  NOTE: Noted consult for Diabetes Coordinator. Chart reviewed. Patient is current in Cath Lab. Noted patient received insulin and D50 at 9:04 am today for treatment of hyperkalemia. CBG down to 55 mg/dl at 10:45 am likely a result of hyperkalemia treatment with insulin and D50.  Recommend discontinuing Tradjenta at this time and use Novolog correction scale for inpatient glycemic control. Will continue to follow.  Thanks, Barnie Alderman, RN, MSN, CDE Diabetes Coordinator Inpatient Diabetes Program [phone] (Team Pager from 8am to 5pm)

## 2018-01-12 NOTE — Progress Notes (Signed)
Pt en route to HD on tele monitor. VSS denies pain right chest CDI. Will return to [facility] if inpatient bed not available post HD treatment.

## 2018-01-12 NOTE — Progress Notes (Signed)
TB test administer at 2145. Test needs to be read 1/12 2145.

## 2018-01-12 NOTE — Progress Notes (Signed)
This note also relates to the following rows which could not be included: Pulse Rate - Cannot attach notes to unvalidated device data Resp - Cannot attach notes to unvalidated device data BP - Cannot attach notes to unvalidated device data  Hd started  

## 2018-01-12 NOTE — ED Notes (Signed)
Pt is going to [facility] with Junie Panning, Therapist, sports. Report was given and explained to [place], Therapist, sports and she verbalized understanding of all information.

## 2018-01-12 NOTE — Progress Notes (Signed)
Family Meeting Note  Advance Directive:no  Today a meeting took place with the Patient.  The following clinical team members were present during this meeting:MD  The following were discussed:Patient's diagnosis: esrd initiating HD, Patient's progosis: > 12 months and Goals for treatment: Full Code  Additional follow-up to be provided: chaplain consult to start advanced directives  Time spent during discussion: 16 minutes  Daniel Rodenberg, MD

## 2018-01-12 NOTE — Progress Notes (Signed)
Pastoral Care Consult    01/12/18 1825  Clinical Encounter Type  Visited With Patient;Health care provider  Visit Type Initial;Spiritual support  Referral From Physician  Consult/Referral To Chaplain   Visited with pt per request for HCPOA.  Pt was with RN at the time.  He mentioned that he had several family members who could be his POA.  I left him the ppwk and gave a brief explanation of it so that he could review with his brother, upon his return to the hospital. Pt will notify nursing staff when/if he desires to complete HCPOA.  Darcey Nora, Chaplain

## 2018-01-12 NOTE — Progress Notes (Signed)
   01/12/18 1100  Clinical Encounter Type  Visited With Patient not available (Patient in Cath Lab ([facility]))   Patient has been moved to Cath Lab [facility]. Chaplain will follow-up at that location.

## 2018-01-12 NOTE — ED Provider Notes (Signed)
[facility] Emergency Department Provider Note  ____________________________________________  Time seen: Approximately 7:47 AM  I have reviewed the triage vital signs and the nursing notes.   HISTORY  Chief Complaint Shortness of Breath   HPI Daniel Thomas is a 63 y.o. male with a history of COPD, HFpEF, HTN, HLD, DM, CKD who presents for evaluation of SOB.  Patient reports cough productive of clear sputum for several days and started having shortness of breath this morning.  The shortness of breath is mild and only present with exertion and resolves at rest.  No fever or chills, no chest pain, no body aches, no vomiting or diarrhea. Patient has not taken his meds this morning.   Past Medical History:  Diagnosis Date  . COPD (chronic obstructive pulmonary disease) ([place])   . Diabetes mellitus without complication ([place])   . Enlarged prostate   . Heart murmur   . High cholesterol   . Hypertension     Patient Active Problem List   Diagnosis Date Noted  . 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  Prior to Admission medications   Medication Sig Start Date End Date Taking? Authorizing Provider  amLODipine (NORVASC) 10 MG tablet Take 1 tablet (10 mg total) by mouth daily. 01/08/18  Yes Wieting, Richard, MD  citalopram (CELEXA) 20 MG tablet Take 20 mg by mouth daily.   Yes [provider]  hydrALAZINE (APRESOLINE) 25 MG tablet Take 1 tablet (25 mg total) by mouth every 8 (eight) hours. 01/08/18  Yes Wieting, Richard, MD  linagliptin (TRADJENTA) 5 MG TABS tablet Take 1 tablet (5 mg total) by mouth daily. 01/09/18  Yes Wieting, Richard, MD  lovastatin (MEVACOR) 40 MG tablet 40 mg nightly. 07/11/08  Yes [provider]  metoprolol tartrate (LOPRESSOR) 50 MG tablet Take 1 tablet (50 mg total) by mouth 2 (two) times daily. 01/08/18  Yes Loletha Grayer, MD  torsemide (DEMADEX) 20 MG tablet 2 tablets po daily 01/08/18  Yes  Wieting, Richard, MD  Vitamin D, Ergocalciferol, (DRISDOL) 1.25 MG (50000 UT) CAPS capsule Take 50,000 Units by mouth once a week. 10/24/17 10/24/18 Yes [provider]    Allergies Patient has no known allergies.  No family history on file.  Social History Social History   Tobacco Use  . Smoking status: Former Smoker    Years: 20.00  . Smokeless tobacco: Never Used  Substance Use Topics  . Alcohol use: Never    Frequency: Never  . Drug use: Never    Review of Systems  Constitutional: Negative for fever. Eyes: Negative for visual changes. ENT: Negative for sore throat. Neck: No neck pain  Cardiovascular: Negative for chest pain. Respiratory: + shortness of breath and cough Gastrointestinal: Negative for abdominal pain, vomiting or diarrhea. Genitourinary: Negative for dysuria. Musculoskeletal: Negative for back pain. Skin: Negative for rash. Neurological: Negative for headaches, weakness or numbness. Psych: No SI or HI  ____________________________________________   PHYSICAL EXAM:  VITAL SIGNS: ED Triage Vitals  Enc Vitals Group     BP 01/12/18 0646 (!) 196/96     Pulse Rate 01/12/18 0646 85     Resp 01/12/18 0646 20     Temp 01/12/18 0646 98 F (36.7 C)     Temp Source 01/12/18 0646 Oral     SpO2 01/12/18 0653 97 %     Weight 01/12/18 0649 167 lb (75.8 kg)     Height 01/12/18 0649 4\' 11"  (1.499 m)  Head Circumference --      Peak Flow --      Pain Score 01/12/18 0648 0     Pain Loc --      Pain Edu? --      Excl. in [place]? --     Constitutional: Alert and oriented. Well appearing and in no apparent distress. HEENT:      Head: Normocephalic and atraumatic.         Eyes: Conjunctivae are normal. Sclera is non-icteric.       Mouth/Throat: Mucous membranes are moist.       Neck: Supple with no signs of meningismus. Cardiovascular: Regular rate and rhythm. No murmurs, gallops, or rubs. 2+ symmetrical distal pulses are present in all  extremities. No JVD. Respiratory: Normal respiratory effort.  Severely diminished air movement bilaterally with faint wheezes gastrointestinal: Soft, non tender, and non distended with positive bowel sounds. No rebound or guarding. Musculoskeletal: Nontender with normal range of motion in all extremities. No edema, cyanosis, or erythema of extremities. Neurologic: Normal speech and language. Face is symmetric. Moving all extremities. No gross focal neurologic deficits are appreciated. Skin: Skin is warm, dry and intact. No rash noted. Psychiatric: Mood and affect are normal. Speech and behavior are normal.  ____________________________________________   LABS (all labs ordered are listed, but only abnormal results are displayed)  Labs Reviewed  CBC WITH DIFFERENTIAL/PLATELET - Abnormal; Notable for the following components:      Result Value   RBC 3.66 (*)    Hemoglobin 10.5 (*)    HCT 32.9 (*)    Platelets 464 (*)    All other components within normal limits  BASIC METABOLIC PANEL - Abnormal; Notable for the following components:   Sodium 134 (*)    Potassium 5.9 (*)    CO2 20 (*)    Glucose, Bld 142 (*)    BUN 89 (*)    Creatinine, Ser 4.49 (*)    Calcium 8.6 (*)    GFR calc non Af Amer 13 (*)    GFR calc Af Amer 15 (*)    All other components within normal limits  BRAIN NATRIURETIC PEPTIDE - Abnormal; Notable for the following components:   B Natriuretic Peptide 396.0 (*)    All other components within normal limits  TROPONIN I - Abnormal; Notable for the following components:   Troponin I 0.03 (*)    All other components within normal limits  INFLUENZA PANEL BY PCR (TYPE A & B)   ____________________________________________  EKG  ED ECG REPORT I, Rudene Re, the attending physician, personally viewed and interpreted this ECG.  Normal sinus rhythm, rate of 88, normal intervals, normal axis, T wave inversions in inferior and lateral leads with minimal ST  elevation in V1 and V2, anterior Q waves.  No prior for comparison. ____________________________________________  RADIOLOGY  I have personally reviewed the images performed during this visit and I agree with the Radiologist's read.   Interpretation by Radiologist:  Dg Chest 2 View  Result Date: 01/12/2018 CLINICAL DATA:  Dyspnea. EXAM: CHEST - 2 VIEW COMPARISON:  Radiograph of January 06, 2018. FINDINGS: Stable cardiomediastinal silhouette. No pneumothorax or pleural effusion is noted. Central pulmonary vascular congestion is noted. No consolidative process is noted. Bony thorax is unremarkable. IMPRESSION: Stable enlargement of cardiomediastinal silhouette with central pulmonary vascular congestion. Minimal pulmonary edema can not be excluded. Electronically Signed   By: Marijo Conception, M.D.   On: 01/12/2018 07:41     ____________________________________________  PROCEDURES  Procedure(s) performed: None Procedures Critical Care performed: yes  CRITICAL CARE Performed by: Rudene Re  ?  Total critical care time: 30 min  Critical care time was exclusive of separately billable procedures and treating other patients.  Critical care was necessary to treat or prevent imminent or life-threatening deterioration.  Critical care was time spent personally by me on the following activities: development of treatment plan with patient and/or surrogate as well as nursing, discussions with consultants, evaluation of patient's response to treatment, examination of patient, obtaining history from patient or surrogate, ordering and performing treatments and interventions, ordering and review of laboratory studies, ordering and review of radiographic studies, pulse oximetry and re-evaluation of patient's condition.  ____________________________________________   INITIAL IMPRESSION / ASSESSMENT AND PLAN / ED COURSE   63 y.o. male with a history of COPD, HFpEF, HTN, HLD, DM, CKD who  presents for evaluation of SOB.    Ddx: CHF, COPD, PNA, Flu, PE  Patient with normal work of breathing, normal sats, diminished air movement bilaterally with faint expiratory wheezes.  Patient looks euvolemic with no pitting edema on exam.  He severely hypertensive in the setting of not taking his morning medications.  EKG showing no evidence of acute ischemia.  Labs showing no leukocytosis, stable anemia, slightly worsening kidney function with hyperkalemia.  EKG with no hyperkalemic changes.  BNP is elevated at 396.  Troponin 0 0.03 which is within patient's baseline.  Chest x-ray concerning for CHF exacerbation. Will give extra dose of IV lasix. Will also give duonebs and solumedrol for possible superimpose COPD exacerbation. Will discuss with nephrology for worsening kidney function and now hyperkalemia.    _________________________ 8:14 AM on 01/12/2018 -----------------------------------------  Discussed with Dr. Candiss Norse from nephrology who recommended admitting patient to the Hospitalist for placement of dialysis catheter and initiation of HD due to worsening kidney function, hyperkalemia, pulmonary edema, and hypertension.    _________________________ 8:43 AM on 01/12/2018 -----------------------------------------  Discussed with Dr. Vianne Bulls for admission  As part of my medical decision making, I reviewed the following data within the [place] notes reviewed and incorporated, Labs reviewed , EKG interpreted , Old EKG reviewed, Radiograph reviewed , Discussed with admitting physician , Notes from prior ED visits and [place] Controlled Substance Database    Pertinent labs & imaging results that were available during my care of the patient were reviewed by me and considered in my medical decision making (see chart for details).    ____________________________________________   FINAL CLINICAL IMPRESSION(S) / ED DIAGNOSES  Final diagnoses:  Acute on chronic  congestive heart failure, unspecified heart failure type ([place])  Acute kidney injury superimposed on chronic kidney disease ([facility])  Hyperkalemia      NEW MEDICATIONS STARTED DURING THIS VISIT:  ED Discharge Orders    None       Note:  This document was prepared using Dragon voice recognition software and may include unintentional dictation errors.    Rudene Re, MD 01/12/18 [phone]

## 2018-01-12 NOTE — H&P (Signed)
[place] [facility] [place] NAME: Daniel Thomas    MR#:  [id number]  DATE OF BIRTH:  04/28/1955  DATE OF ADMISSION:  01/12/2018  PRIMARY CARE PHYSICIAN: Marguerita Merles, MD   REQUESTING/REFERRING PHYSICIAN: dr Alfred Levins  CHIEF COMPLAINT:   Shortness of breath HISTORY OF PRESENT ILLNESS:  Daniel Thomas  is a 63 y.o. male with a known history of chronic kidney disease stage IV, diabetes, COPD and chronic diastolic heart failure who presents to the emergency room due to shortness of breath.  Patient reports over the past 24 hours he has had increasing shortness of breath.  He is also noticed some wheezing.  He denies chest pain, palpitations, abdominal pain, nausea or vomiting.  He denies fever or chills. Chest x-ray performed in the emergency room shows minimal pulmonary edema. Labs were noted for worsening creatinine as well as hyperkalemia.  Plan for admission as per Dr. Candiss Norse from nephrology who has spoken with vascular surgery for patient to start hemodialysis and placement of dialysis catheter. PAST MEDICAL HISTORY:   Past Medical History:  Diagnosis Date  . COPD (chronic obstructive pulmonary disease) ([place])   . Diabetes mellitus without complication ([place])   . Enlarged prostate   . Heart murmur   . High cholesterol   . Hypertension     PAST SURGICAL HISTORY:  No past surgical history on file.  SOCIAL HISTORY:   Social History   Tobacco Use  . Smoking status: Former Smoker    Years: 20.00  . Smokeless tobacco: Never Used  Substance Use Topics  . Alcohol use: Never    Frequency: Never    FAMILY HISTORY:  No family history on file.  DRUG ALLERGIES:  No Known Allergies  REVIEW OF SYSTEMS:   Review of Systems  Constitutional: Negative.  Negative for chills, fever and malaise/fatigue.  HENT: Negative.  Negative for ear discharge, ear pain, hearing loss, nosebleeds and sore throat.   Eyes: Negative.  Negative for blurred vision and  pain.  Respiratory: Positive for cough and shortness of breath. Negative for hemoptysis and wheezing.   Cardiovascular: Negative.  Negative for chest pain, palpitations and leg swelling.  Gastrointestinal: Negative.  Negative for abdominal pain, blood in stool, diarrhea, nausea and vomiting.  Genitourinary: Negative.  Negative for dysuria.  Musculoskeletal: Negative.  Negative for back pain.  Skin: Negative.   Neurological: Negative for dizziness, tremors, speech change, focal weakness, seizures and headaches.  Endo/Heme/Allergies: Negative.  Does not bruise/bleed easily.  Psychiatric/Behavioral: Negative.  Negative for depression, hallucinations and suicidal ideas.    MEDICATIONS AT HOME:   Prior to Admission medications   Medication Sig Start Date End Date Taking? Authorizing Provider  amLODipine (NORVASC) 10 MG tablet Take 1 tablet (10 mg total) by mouth daily. 01/08/18  Yes Wieting, Richard, MD  citalopram (CELEXA) 20 MG tablet Take 20 mg by mouth daily.   Yes [provider]  hydrALAZINE (APRESOLINE) 25 MG tablet Take 1 tablet (25 mg total) by mouth every 8 (eight) hours. 01/08/18  Yes Wieting, Richard, MD  linagliptin (TRADJENTA) 5 MG TABS tablet Take 1 tablet (5 mg total) by mouth daily. 01/09/18  Yes Wieting, Richard, MD  lovastatin (MEVACOR) 40 MG tablet 40 mg nightly. 07/11/08  Yes [provider]  metoprolol tartrate (LOPRESSOR) 50 MG tablet Take 1 tablet (50 mg total) by mouth 2 (two) times daily. 01/08/18  Yes Loletha Grayer, MD  torsemide (DEMADEX) 20 MG tablet 2 tablets po daily 01/08/18  Yes Wieting, Richard, MD  Vitamin D, Ergocalciferol, (DRISDOL) 1.25 MG (50000 UT) CAPS capsule Take 50,000 Units by mouth once a week. 10/24/17 10/24/18 Yes [provider]      VITAL SIGNS:  Blood pressure 111/60, pulse 66, temperature 98 F (36.7 C), temperature source Oral, resp. rate (!) 22, height 4\' 11"  (1.499 m), weight 75.8 kg, SpO2 96 %.  PHYSICAL EXAMINATION:    Physical Exam Constitutional:      General: He is not in acute distress. HENT:     Head: Normocephalic.  Eyes:     General: No scleral icterus. Neck:     Musculoskeletal: Normal range of motion and neck supple.     Vascular: No JVD.     Trachea: No tracheal deviation.  Cardiovascular:     Rate and Rhythm: Normal rate and regular rhythm.     Heart sounds: Normal heart sounds. No murmur. No friction rub. No gallop.   Pulmonary:     Effort: Pulmonary effort is normal. No respiratory distress.     Breath sounds: Normal breath sounds. No wheezing or rales.  Chest:     Chest wall: No tenderness.  Abdominal:     General: Bowel sounds are normal. There is no distension.     Palpations: Abdomen is soft. There is no mass.     Tenderness: There is no abdominal tenderness. There is no guarding or rebound.  Musculoskeletal: Normal range of motion.  Skin:    General: Skin is warm.     Findings: No erythema or rash.  Neurological:     Mental Status: He is alert and oriented to person, place, and time.  Psychiatric:        Judgment: Judgment normal.       LABORATORY PANEL:   CBC Recent Labs  Lab 01/12/18 0650  WBC 10.5  HGB 10.5*  HCT 32.9*  PLT 464*   ------------------------------------------------------------------------------------------------------------------  Chemistries  Recent Labs  Lab 01/07/18 1215  01/12/18 0650  NA  --    < > 134*  K  --    < > 5.9*  CL  --    < > 106  CO2  --    < > 20*  GLUCOSE  --    < > 142*  BUN  --    < > 89*  CREATININE  --    < > 4.49*  CALCIUM  --    < > 8.6*  AST 21  --   --   ALT 28  --   --   ALKPHOS 103  --   --   BILITOT 0.6  --   --    < > = values in this interval not displayed.   ------------------------------------------------------------------------------------------------------------------  Cardiac Enzymes Recent Labs  Lab 01/12/18 0650  TROPONINI 0.03*    ------------------------------------------------------------------------------------------------------------------  RADIOLOGY:  Dg Chest 2 View  Result Date: 01/12/2018 CLINICAL DATA:  Dyspnea. EXAM: CHEST - 2 VIEW COMPARISON:  Radiograph of January 06, 2018. FINDINGS: Stable cardiomediastinal silhouette. No pneumothorax or pleural effusion is noted. Central pulmonary vascular congestion is noted. No consolidative process is noted. Bony thorax is unremarkable. IMPRESSION: Stable enlargement of cardiomediastinal silhouette with central pulmonary vascular congestion. Minimal pulmonary edema can not be excluded. Electronically Signed   By: Marijo Conception, M.D.   On: 01/12/2018 07:41    EKG:   Sinus rhythm no ST elevation or depression  IMPRESSION AND PLAN:   62 year old male with stage IV chronic kidney disease with  most recent discharge from the hospital for acute diastolic heart failure and accelerated hypertension who has now approaching end-stage renal disease requiring hemodialysis who presented to the emergency room due to shortness of breath.  1.  Acute on chronic stage IV chronic kidney disease: Patient has now progressed to end-stage renal disease and will require initiation of dialysis.  Vascular surgery and nephrology have been notified.  Plan for temporary catheter placement today and possibly starting dialysis either today or tomorrow.  2.  Hyperkalemia in the setting of problem 1: This will be treated with Veltassa  3.  Hypertension: Patient's blood pressure is better controlled  Continue Norvasc, Toprol, hydralazine PRN hydralazine  4.  Chronic diastolic heart failure: Increased dose of torsemide for now until initiation of dialysis  5.  Diabetes: Diabetes nurse consultation requested Sliding scale with ADA diet  6.  History of elevated PSA: Patient will follow-up with urology as outpatient for prostate biopsy  7.  Hyperlipidemia: Continue statin  8.  Depression:  Continue Celexa   D/w dr Candiss Norse  All the records are reviewed and case discussed with ED provider. Management plans discussed with the patient and he is in agreement  CODE STATUS: FULL  TOTAL TIME TAKING CARE OF THIS PATIENT: 48 minutes.    Leif Loflin M.D on 01/12/2018 at 10:14 AM  Between 7am to 6pm - Pager - [phone]  After 6pm go to [email or web address] - password [email or web address] [facility]  Office  [phone]  CC: Primary care physician; Marguerita Merles, MD

## 2018-01-12 NOTE — Op Note (Signed)
OPERATIVE NOTE   PROCEDURE: 1. Insertion of tunneled dialysis catheter right IJ approach with ultrasound and fluoroscopic guidance.  PRE-OPERATIVE DIAGNOSIS: End-stage renal disease; malignant hyperkalemia  POST-OPERATIVE DIAGNOSIS: Same  SURGEON: Hortencia Pilar.  ANESTHESIA: Conscious sedation was administered under my direct supervision by the interventional radiology RN. IV Versed plus fentanyl were utilized. Continuous ECG, pulse oximetry and blood pressure was monitored throughout the entire procedure. Conscious sedation was for a total of 31 minutes.  ESTIMATED BLOOD LOSS: Minimal cc  CONTRAST USED:  None  FLUOROSCOPY TIME: Approximately 3 minutes  INDICATIONS:   Daniel Thomas a 63 y.o. y.o. male who presents with worsening respiratory difficulty and volume overload.  He is also noted to have malignant hyperkalemia.  BUN and creatinine have deteriorated and he has now reached end-stage and will require hemodialysis.  Risks and benefits for catheter insertion were reviewed all questions answered patient agrees to proceed..  DESCRIPTION: After obtaining full informed written consent, the patient was positioned supine. The right was prepped and draped in a sterile fashion. Ultrasound was placed in a sterile sleeve. Ultrasound was utilized to identify the right internal jugular vein which is noted to be echolucent and compressible indicating patency. Image is recorded for the permanent record. Under direct ultrasound visualization a micro-needle is inserted into the vein followed by the micro-wire. Micro-sheath was then advanced and a J wire is inserted without difficulty under fluoroscopic guidance. Small counterincision was made at the wire insertion site. Dilators are passed over the wire and the tunneled dialysis catheter is fed into the central venous system without difficulty.  Under fluoroscopy the catheter tip positioned at the atrial caval junction. The catheter is then  approximated to the chest wall and an exit site selected. 1% lidocaine is infiltrated in soft tissues at this level small incision is made and the tunneling device is then passed from the exit site to the neck counterincision. Catheter is then connected to the tunneling device and the catheter was pulled subcutaneously. It is then transected and the hub assembly connected without difficulty. Both lumens aspirate and flush easily. After verification of smooth contour with proper tip position under fluoroscopy the catheter is packed with 5000 units of heparin per lumen.  Catheter secured to the skin of the right chest wall with 0 silk. A sterile dressing is applied with a Biopatch.  COMPLICATIONS: None  CONDITION: Good  Hortencia Pilar [place] [facility]. Office:  [phone]   01/12/2018,11:36 AM

## 2018-01-12 NOTE — ED Notes (Signed)
Pt is hypertensive and c/o shortness of breat. O2 sats are 92% on room air. He is AOx4 and in bed with rails upx2. We will continue to monitor the pt.

## 2018-01-12 NOTE — ED Triage Notes (Signed)
Pt here from home via ems , sob this am X1 hour. 18g lac , 3lt/[place]

## 2018-01-12 NOTE — Progress Notes (Signed)
Pt family called , Joe (brother)[phone]. Notified of Pt new room number [id number]. Report called to floor RN.

## 2018-01-12 NOTE — Progress Notes (Signed)
[facility], [place] 01/12/18  Subjective:   Patient was admitted last week for SOB, edema, uncontrolled HTN and orthopnea. He improved with iv diuresis but presents again via EMS for SOB since this AM Lab results showed creatinine remains elevated at 4.5, potassium elevated at 5.9 Chest x-ray shows enlarged cardiomediastinal silhouette with pulmonary vascular congestion and mild pulmonary edema.  Patient denies any loss of appetite.  This morning he feels better and is on room air Given potassium shifting measures in the emergency room. Aunt at bedside  Objective:  Vital signs in last 24 hours:  Temp:  [98 F (36.7 C)] 98 F (36.7 C) (01/10 0646) Pulse Rate:  [85] 85 (01/10 0646) Resp:  [20] 20 (01/10 0646) BP: (196)/(96) 196/96 (01/10 0646) SpO2:  [97 %] 97 % (01/10 0653) Weight:  [75.8 kg] 75.8 kg (01/10 0649)  Weight change:  Filed Weights   01/12/18 0649  Weight: 75.8 kg    Intake/Output:   No intake or output data in the 24 hours ending 01/12/18 0852   Physical Exam: General:  No acute distress, laying in the bed  HEENT  moist oral mucous membranes  Neck  supple  Pulm/lungs  room air, mild basilar crackles  CVS/Heart  regular, no rub  Abdomen:   Soft, nontender  Extremities:  Trace edema  Neurologic:  Alert, oriented  Skin:  No acute rashes          Basic Metabolic Panel:  Recent Labs  Lab 01/06/18 1959 01/06/18 2333 01/07/18 0641 01/08/18 0515 01/12/18 0650  NA 137  --  137 137 134*  K 4.6  --  4.7 4.5 5.9*  CL 112*  --  112* 110 106  CO2 20*  --  16* 19* 20*  GLUCOSE 183*  --  135* 115* 142*  BUN 50*  --  57* 59* 89*  CREATININE 4.01* 3.86* 3.92* 4.39* 4.49*  CALCIUM 8.1*  --  8.2* 8.1* 8.6*     CBC: Recent Labs  Lab 01/06/18 1959 01/06/18 2333 01/07/18 0641 01/12/18 0650  WBC 10.3 13.2* 12.8* 10.5  NEUTROABS 7.6  --   --  7.4  HGB 10.6* 10.6* 10.3* 10.5*  HCT 32.5* 33.3* 32.1* 32.9*  MCV 90.0 89.8 90.9  89.9  PLT 394 424* 416* 464*      Lab Results  Component Value Date   HEPBSAG Negative 01/07/2018   HEPBSAB Non Reactive 01/07/2018   HEPBIGM Negative 01/07/2018      Microbiology:  Recent Results (from the past 240 hour(s))  MRSA PCR Screening     Status: None   Collection Time: 01/06/18 10:49 PM  Result Value Ref Range Status   MRSA by PCR NEGATIVE NEGATIVE Final    Comment:        The GeneXpert MRSA Assay (FDA approved for NASAL specimens only), is one component of a comprehensive MRSA colonization surveillance program. It is not intended to diagnose MRSA infection nor to guide or monitor treatment for MRSA infections. Performed at [facility], [address] [place], [place] [address]     Coagulation Studies: No results for input(s): LABPROT, INR in the last 72 hours.  Urinalysis: No results for input(s): COLORURINE, LABSPEC, PHURINE, GLUCOSEU, HGBUR, BILIRUBINUR, KETONESUR, PROTEINUR, UROBILINOGEN, NITRITE, LEUKOCYTESUR in the last 72 hours.  Invalid input(s): APPERANCEUR    Imaging: Dg Chest 2 View  Result Date: 01/12/2018 CLINICAL DATA:  Dyspnea. EXAM: CHEST - 2 VIEW COMPARISON:  Radiograph of January 06, 2018. FINDINGS:  Stable cardiomediastinal silhouette. No pneumothorax or pleural effusion is noted. Central pulmonary vascular congestion is noted. No consolidative process is noted. Bony thorax is unremarkable. IMPRESSION: Stable enlargement of cardiomediastinal silhouette with central pulmonary vascular congestion. Minimal pulmonary edema can not be excluded. Electronically Signed   By: Marijo Conception, M.D.   On: 01/12/2018 07:41     Medications:    . dextrose  1 ampule Intravenous Once  . furosemide  20 mg Intravenous Once  . insulin aspart  10 Units Intravenous Once  . ipratropium-albuterol  3 mL Nebulization Once  . ipratropium-albuterol  3 mL Nebulization Once  . ipratropium-albuterol  3 mL Nebulization Once  . methylPREDNISolone  (SOLU-MEDROL) injection  125 mg Intravenous Once  . sodium bicarbonate  50 mEq Intravenous Once  . sodium zirconium cyclosilicate  10 g Oral Once     Assessment/ Plan:  63 y.o. African-American male with diabetes mellitus type II, hypertension, diabetes mellitus type II, COPD, BPH, hyperlipidemia, who was admitted to [facility] on 01/06/2018 for acute renal failure and hypertensive emergency. Required nicardipine gtt on admission.   1.  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    2.  Acute pulm edema 3.  Hyperkalemia 4.  Hypertension with CKD 5.  Diabetes type 2 with chronic kidney disease.  Hemoglobin A1c 8.5% January 07, 2018 6.  Secondary hyperparathyroidism.  PTH 214 on September 15, 2017  Recurrent admission for volume/potassium related problems.  Recommended to patient that we start him on chronic hemodialysis.  He discussed with his aunt and has reluctantly agreed to proceed.  We will consult vascular surgery for tunneled dialysis catheter placement on Monday.  In the meantime control potassium with veltassa.  Continue to hold ACE inhibitor.  Volume control with torsemide.  IV Lasix as needed.  Renal diet with fluid restriction to 1200 cc/day. Start discharge planning for outpatient dialysis.      LOS: 0 Elese Rane Candiss Norse 1/10/20208:52 AM  [place], [place]  Note: This note was prepared with Dragon dictation. Any transcription errors are unintentional

## 2018-01-13 LAB — BASIC METABOLIC PANEL
Anion gap: 10 (ref 5–15)
BUN: 78 mg/dL — ABNORMAL HIGH (ref 8–23)
CO2: 20 mmol/L — ABNORMAL LOW (ref 22–32)
Calcium: 8.1 mg/dL — ABNORMAL LOW (ref 8.9–10.3)
Chloride: 104 mmol/L (ref 98–111)
Creatinine, Ser: 4.34 mg/dL — ABNORMAL HIGH (ref 0.61–1.24)
GFR calc Af Amer: 16 mL/min — ABNORMAL LOW (ref >60)
GFR, EST NON AFRICAN AMERICAN: 14 mL/min — AB (ref >60)
Glucose, Bld: 203 mg/dL — ABNORMAL HIGH (ref 70–99)
Potassium: 5 mmol/L (ref 3.5–5.1)
Sodium: 134 mmol/L — ABNORMAL LOW (ref 135–145)

## 2018-01-13 LAB — CBC
HCT: 28.2 % — ABNORMAL LOW (ref 39.0–52.0)
Hemoglobin: 9.1 g/dL — ABNORMAL LOW (ref 13.0–17.0)
MCH: 28.2 pg (ref 26.0–34.0)
MCHC: 32.3 g/dL (ref 30.0–36.0)
MCV: 87.3 fL (ref 80.0–100.0)
Platelets: 385 10*3/uL (ref 150–400)
RBC: 3.23 MIL/uL — ABNORMAL LOW (ref 4.22–5.81)
RDW: 13.6 % (ref 11.5–15.5)
WBC: 14.1 10*3/uL — ABNORMAL HIGH (ref 4.0–10.5)
nRBC: 0 % (ref 0.0–0.2)

## 2018-01-13 LAB — GLUCOSE, CAPILLARY
GLUCOSE-CAPILLARY: 131 mg/dL — AB (ref 70–99)
GLUCOSE-CAPILLARY: 141 mg/dL — AB (ref 70–99)
Glucose-Capillary: 163 mg/dL — ABNORMAL HIGH (ref 70–99)

## 2018-01-13 LAB — HEPATITIS B SURFACE ANTIBODY,QUALITATIVE: HEP B S AB: NONREACTIVE

## 2018-01-13 LAB — HEPATITIS B SURFACE ANTIGEN: Hepatitis B Surface Ag: NEGATIVE

## 2018-01-13 LAB — HEPATITIS B CORE ANTIBODY, IGM: HEP B C IGM: NEGATIVE

## 2018-01-13 MED ORDER — TORSEMIDE 20 MG PO TABS
20.0000 mg | ORAL_TABLET | Freq: Every day | ORAL | Status: DC
  Administered 2018-01-13 – 2018-01-16 (×4): 20 mg via ORAL
  Filled 2018-01-13 (×4): qty 1

## 2018-01-13 NOTE — Progress Notes (Signed)
This note also relates to the following rows which could not be included: Pulse Rate - Cannot attach notes to unvalidated device data Resp - Cannot attach notes to unvalidated device data BP - Cannot attach notes to unvalidated device data  Hd completed  

## 2018-01-13 NOTE — Plan of Care (Signed)
  Problem: Clinical Measurements: Goal: Respiratory complications will improve Outcome: Progressing   Problem: Activity: Goal: Risk for activity intolerance will decrease Outcome: Progressing   Problem: Pain Managment: Goal: General experience of comfort will improve Outcome: Progressing   

## 2018-01-13 NOTE — Progress Notes (Signed)
This note also relates to the following rows which could not be included: Pulse Rate - Cannot attach notes to unvalidated device data Resp - Cannot attach notes to unvalidated device data  Hd started  

## 2018-01-13 NOTE — Progress Notes (Signed)
[facility] [place] NAME: Daniel Thomas    MR#:  [id number]  DATE OF BIRTH:  [birth year]  SUBJECTIVE:   Patient had first dialysis session yesterday.  No acute issues this morning.  Denies shortness of breath.  REVIEW OF SYSTEMS:    Review of Systems  Constitutional: Negative for fever, chills weight loss HENT: Negative for ear pain, nosebleeds, congestion, facial swelling, rhinorrhea, neck pain, neck stiffness and ear discharge.   Respiratory: Negative for cough, shortness of breath, wheezing  Cardiovascular: Negative for chest pain, palpitations and leg swelling.  Gastrointestinal: Negative for heartburn, abdominal pain, vomiting, diarrhea or consitpation Genitourinary: Negative for dysuria, urgency, frequency, hematuria Musculoskeletal: Negative for back pain or joint pain Neurological: Negative for dizziness, seizures, syncope, focal weakness,  numbness and headaches.  Hematological: Does not bruise/bleed easily.  Psychiatric/Behavioral: Negative for hallucinations, confusion, dysphoric mood    Tolerating Diet: yes      DRUG ALLERGIES:  No Known Allergies  VITALS:  Blood pressure 133/69, pulse 89, temperature 98.2 F (36.8 C), temperature source Oral, resp. rate 20, height 4\' 11"  (1.499 m), weight 74.7 kg, SpO2 98 %.  PHYSICAL EXAMINATION:  Constitutional: Appears well-developed and well-nourished. No distress. HENT: Normocephalic. Marland Kitchen Oropharynx is clear and moist.  Eyes: Conjunctivae and EOM are normal. PERRLA, no scleral icterus.  Neck: Normal ROM. Neck supple. No JVD. No tracheal deviation. CVS: RRR, S1/S2 +, no murmurs, no gallops, no carotid bruit.  Pulmonary: Effort and breath sounds normal, no stridor, rhonchi, wheezes, rales.  Abdominal: Soft. BS +,  no distension, tenderness, rebound or guarding.  Musculoskeletal: Normal range of motion. No edema and no tenderness.  Neuro: Alert. CN 2-12 grossly intact. No focal  deficits. Skin: Skin is warm and dry. No rash noted. Psychiatric: Normal mood and affect.   Tunneled dialysis catheter right chest wall  LABORATORY PANEL:   CBC Recent Labs  Lab 01/13/18 0508  WBC 14.1*  HGB 9.1*  HCT 28.2*  PLT 385   ------------------------------------------------------------------------------------------------------------------  Chemistries  Recent Labs  Lab 01/07/18 1215  01/13/18 0508  NA  --    < > 134*  K  --    < > 5.0  CL  --    < > 104  CO2  --    < > 20*  GLUCOSE  --    < > 203*  BUN  --    < > 78*  CREATININE  --    < > 4.34*  CALCIUM  --    < > 8.1*  AST 21  --   --   ALT 28  --   --   ALKPHOS 103  --   --   BILITOT 0.6  --   --    < > = values in this interval not displayed.   ------------------------------------------------------------------------------------------------------------------  Cardiac Enzymes Recent Labs  Lab 01/06/18 2333 01/07/18 0641 01/12/18 0650  TROPONINI 0.04* 0.06* 0.03*   ------------------------------------------------------------------------------------------------------------------  RADIOLOGY:  Dg Chest 2 View  Result Date: 01/12/2018 CLINICAL DATA:  Dyspnea. EXAM: CHEST - 2 VIEW COMPARISON:  Radiograph of January 06, 2018. FINDINGS: Stable cardiomediastinal silhouette. No pneumothorax or pleural effusion is noted. Central pulmonary vascular congestion is noted. No consolidative process is noted. Bony thorax is unremarkable. IMPRESSION: Stable enlargement of cardiomediastinal silhouette with central pulmonary vascular congestion. Minimal pulmonary edema can not be excluded. Electronically Signed   By: Marijo Conception, M.D.   On: 01/12/2018 07:41  ASSESSMENT AND PLAN:   63 year old male with stage IV chronic kidney disease with most recent discharge from the hospital for acute diastolic heart failure and accelerated hypertension who has now approaching end-stage renal disease requiring hemodialysis  who presented to the emergency room due to shortness of breath.  1.  Acute on chronic stage IV chronic kidney disease: Patient is now approaching end-stage renal disease.  He had first dialysis treatment yesterday.  He has tunneled catheter placed for dialysis.  He will need 2 more treatments and then social work to assist with outpatient dialysis centers.  2.  Hyperkalemia in the setting of problem 1: This has resolved.    3.  Hypertension: Continue Norvasc, hydralazine, metoprolol  PRN hydralazine  4.  Chronic diastolic heart failure: Continue torsemide   5.  Diabetes: Continue with ADA diet, sliding scale management as per diabetes nurse  6.  History of elevated PSA: Patient will follow-up with urology as outpatient for prostate biopsy  7.  Hyperlipidemia: Continue statin  8.  Depression: Continue Celexa      Management plans discussed with the patient and he is in agreement.  CODE STATUS: full  TOTAL TIME TAKING CARE OF THIS PATIENT: 27 minutes.     POSSIBLE D/C 3 days DEPENDING ON CLINICAL CONDITION.   Ladye Macnaughton M.D on 01/13/2018 at 9:33 AM  Between 7am to 6pm - Pager - [phone] After 6pm go to [email or web address] - password [email or web address] [facility]  Office  [phone]  CC: Primary care physician; Marguerita Merles, MD  Note: This dictation was prepared with Dragon dictation along with smaller phrase technology. Any transcriptional errors that result from this process are unintentional.

## 2018-01-13 NOTE — Progress Notes (Signed)
[facility], [place] 01/13/18  Subjective:   Patient was admitted last week for SOB, edema, uncontrolled HTN and orthopnea. He improved with iv diuresis but presents again via EMS for SOB PermCath placed yesterday.  Patient started on dialysis.  Tolerated first treatment well Currently seen on hemodialysis   HEMODIALYSIS FLOWSHEET:  Blood Flow Rate (mL/min): 250 mL/min Arterial Pressure (mmHg): -60 mmHg Venous Pressure (mmHg): 80 mmHg Transmembrane Pressure (mmHg): 60 mmHg Ultrafiltration Rate (mL/min): 330 mL/min Dialysate Flow Rate (mL/min): 500 ml/min Conductivity: Machine : 14.2 Conductivity: Machine : 14.2 Dialysis Fluid Bolus: Normal Saline Bolus Amount (mL): 250 mL    Objective:  Vital signs in last 24 hours:  Temp:  [97.9 F (36.6 C)-98.6 F (37 C)] 98.6 F (37 C) (01/11 1130) Pulse Rate:  [67-96] 80 (01/11 1400) Resp:  [11-28] 15 (01/11 1400) BP: (113-167)/(61-87) 157/76 (01/11 1400) SpO2:  [98 %-99 %] 98 % (01/11 0816) Weight:  [74.7 kg-75.8 kg] 75.8 kg (01/11 1130)  Weight change: -1.043 kg Filed Weights   01/12/18 0649 01/13/18 0522 01/13/18 1130  Weight: 75.8 kg 74.7 kg 75.8 kg    Intake/Output:    Intake/Output Summary (Last 24 hours) at 01/13/2018 1410 Last data filed at 01/13/2018 0200 Gross per 24 hour  Intake -  Output 300 ml  Net -300 ml     Physical Exam: General:  No acute distress, laying in the bed  HEENT  moist oral mucous membranes  Neck  supple  Pulm/lungs  room air, mild basilar crackles  CVS/Heart  regular, no rub  Abdomen:   Soft, nontender  Extremities:  Trace edema  Neurologic:  Alert, oriented  Skin:  No acute rashes   Right IJ tunneled dialysis catheter       Basic Metabolic Panel:  Recent Labs  Lab 01/06/18 1959 01/06/18 2333 01/07/18 0641 01/08/18 0515 01/12/18 0650 01/13/18 0508  NA 137  --  137 137 134* 134*  K 4.6  --  4.7 4.5 5.9* 5.0  CL 112*  --  112* 110 106 104  CO2 20*   --  16* 19* 20* 20*  GLUCOSE 183*  --  135* 115* 142* 203*  BUN 50*  --  57* 59* 89* 78*  CREATININE 4.01* 3.86* 3.92* 4.39* 4.49* 4.34*  CALCIUM 8.1*  --  8.2* 8.1* 8.6* 8.1*     CBC: Recent Labs  Lab 01/06/18 1959 01/06/18 2333 01/07/18 0641 01/12/18 0650 01/13/18 0508  WBC 10.3 13.2* 12.8* 10.5 14.1*  NEUTROABS 7.6  --   --  7.4  --   HGB 10.6* 10.6* 10.3* 10.5* 9.1*  HCT 32.5* 33.3* 32.1* 32.9* 28.2*  MCV 90.0 89.8 90.9 89.9 87.3  PLT 394 424* 416* 464* 385      Lab Results  Component Value Date   HEPBSAG Negative 01/12/2018   HEPBSAB Non Reactive 01/12/2018   HEPBIGM Negative 01/12/2018      Microbiology:  Recent Results (from the past 240 hour(s))  MRSA PCR Screening     Status: None   Collection Time: 01/06/18 10:49 PM  Result Value Ref Range Status   MRSA by PCR NEGATIVE NEGATIVE Final    Comment:        The GeneXpert MRSA Assay (FDA approved for NASAL specimens only), is one component of a comprehensive MRSA colonization surveillance program. It is not intended to diagnose MRSA infection nor to guide or monitor treatment for MRSA infections. Performed at [facility], [address] [place],  [place] [address]     Coagulation Studies: No results for input(s): LABPROT, INR in the last 72 hours.  Urinalysis: No results for input(s): COLORURINE, LABSPEC, PHURINE, GLUCOSEU, HGBUR, BILIRUBINUR, KETONESUR, PROTEINUR, UROBILINOGEN, NITRITE, LEUKOCYTESUR in the last 72 hours.  Invalid input(s): APPERANCEUR    Imaging: Dg Chest 2 View  Result Date: 01/12/2018 CLINICAL DATA:  Dyspnea. EXAM: CHEST - 2 VIEW COMPARISON:  Radiograph of January 06, 2018. FINDINGS: Stable cardiomediastinal silhouette. No pneumothorax or pleural effusion is noted. Central pulmonary vascular congestion is noted. No consolidative process is noted. Bony thorax is unremarkable. IMPRESSION: Stable enlargement of cardiomediastinal silhouette with central pulmonary  vascular congestion. Minimal pulmonary edema can not be excluded. Electronically Signed   By: Marijo Conception, M.D.   On: 01/12/2018 07:41     Medications:   . sodium chloride     . amLODipine  10 mg Oral Daily  . Chlorhexidine Gluconate Cloth  6 each Topical Q0600  . citalopram  20 mg Oral Daily  . heparin  5,000 Units Subcutaneous Q8H  . hydrALAZINE  25 mg Oral Q8H  . insulin aspart  0-15 Units Subcutaneous TID WC  . insulin aspart  0-5 Units Subcutaneous QHS  . ipratropium-albuterol  3 mL Nebulization Once  . metoprolol tartrate  50 mg Oral BID  . pravastatin  10 mg Oral q1800  . sodium chloride flush  3 mL Intravenous Q12H  . torsemide  20 mg Oral Daily  . tuberculin  5 Units Intradermal Once  . Vitamin D (Ergocalciferol)  50,000 Units Oral Weekly     Assessment/ Plan:  63 y.o. African-American male with diabetes mellitus type II, hypertension, diabetes mellitus type II, COPD, BPH, hyperlipidemia, who presents for acute shortness of breath and hyperkalemia  1.  Acute renal failure on CKD 4.   Baseline creatinine 3.86, GFR 18 on January 06, 2018.  [facility] nephrology 2.  Acute pulm edema 3.  Hyperkalemia 4.  Hypertension with CKD 5.  Diabetes type 2 with chronic kidney disease.  Hemoglobin A1c 8.5% January 07, 2018 6.  Secondary hyperparathyroidism.  PTH 214 on September 15, 2017  Recurrent admission for volume/potassium related problems.  He has recommended to patient that he starts chronic dialysis but patient and his brother are not convinced.  For now, we will set him up as outpatient as AKI dialysis.  Depending on outpatient course, further decisions can be made.  For now blood pressure and volume control have improved.  Potassium level has improved.  We will continue to monitor.     LOS: [place] 1/11/20202:10 PM  [place], [place]  Note: This note was prepared with Dragon dictation. Any transcription errors are  unintentional

## 2018-01-14 LAB — BASIC METABOLIC PANEL
Anion gap: 10 (ref 5–15)
BUN: 47 mg/dL — ABNORMAL HIGH (ref 8–23)
CO2: 28 mmol/L (ref 22–32)
Calcium: 8.1 mg/dL — ABNORMAL LOW (ref 8.9–10.3)
Chloride: 101 mmol/L (ref 98–111)
Creatinine, Ser: 3.44 mg/dL — ABNORMAL HIGH (ref 0.61–1.24)
GFR calc Af Amer: 21 mL/min — ABNORMAL LOW (ref >60)
GFR calc non Af Amer: 18 mL/min — ABNORMAL LOW (ref >60)
Glucose, Bld: 138 mg/dL — ABNORMAL HIGH (ref 70–99)
Potassium: 4.1 mmol/L (ref 3.5–5.1)
Sodium: 139 mmol/L (ref 135–145)

## 2018-01-14 LAB — GLUCOSE, CAPILLARY
GLUCOSE-CAPILLARY: 133 mg/dL — AB (ref 70–99)
Glucose-Capillary: 130 mg/dL — ABNORMAL HIGH (ref 70–99)
Glucose-Capillary: 161 mg/dL — ABNORMAL HIGH (ref 70–99)
Glucose-Capillary: 142 mg/dL — ABNORMAL HIGH (ref 70–99)

## 2018-01-14 NOTE — Progress Notes (Signed)
[place], [place] 01/14/18  Subjective:   Feels better today.  Able to eat without nausea or vomiting.  No shortness of breath.  No leg edema Has been tolerating dialysis well.  Has had 2 treatments so far   Objective:  Vital signs in last 24 hours:  Temp:  [98 F (36.7 C)-99 F (37.2 C)] 99 F (37.2 C) (01/12 0741) Pulse Rate:  [67-84] 79 (01/12 0741) Resp:  [11-20] 18 (01/12 0741) BP: (113-159)/(59-76) 155/71 (01/12 0741) SpO2:  [99 %-100 %] 99 % (01/12 0741) Weight:  [73.9 kg-75.8 kg] 73.9 kg (01/12 0358)  Weight change: 1.093 kg Filed Weights   01/13/18 0522 01/13/18 1130 01/14/18 0358  Weight: 74.7 kg 75.8 kg 73.9 kg    Intake/Output:    Intake/Output Summary (Last 24 hours) at 01/14/2018 1128 Last data filed at 01/14/2018 0400 Gross per 24 hour  Intake -  Output 850 ml  Net -850 ml     Physical Exam: General:  No acute distress, laying in the bed  HEENT  moist oral mucous membranes  Neck  supple  Pulm/lungs  room air, mild basilar crackles  CVS/Heart  regular, no rub  Abdomen:   Soft, nontender  Extremities:  Trace edema  Neurologic:  Alert, oriented  Skin:  No acute rashes   Right IJ tunneled dialysis catheter       Basic Metabolic Panel:  Recent Labs  Lab 01/08/18 0515 01/12/18 0650 01/13/18 0508 01/14/18 0323  NA 137 134* 134* 139  K 4.5 5.9* 5.0 4.1  CL 110 106 104 101  CO2 19* 20* 20* 28  GLUCOSE 115* 142* 203* 138*  BUN 59* 89* 78* 47*  CREATININE 4.39* 4.49* 4.34* 3.44*  CALCIUM 8.1* 8.6* 8.1* 8.1*     CBC: Recent Labs  Lab 01/12/18 0650 01/13/18 0508  WBC 10.5 14.1*  NEUTROABS 7.4  --   HGB 10.5* 9.1*  HCT 32.9* 28.2*  MCV 89.9 87.3  PLT 464* 385      Lab Results  Component Value Date   HEPBSAG Negative 01/12/2018   HEPBSAB Non Reactive 01/12/2018   HEPBIGM Negative 01/12/2018      Microbiology:  Recent Results (from the past 240 hour(s))  MRSA PCR Screening     Status: None   Collection Time: 01/06/18 10:49 PM  Result Value Ref Range Status   MRSA by PCR NEGATIVE NEGATIVE Final    Comment:        The GeneXpert MRSA Assay (FDA approved for NASAL specimens only), is one component of a comprehensive MRSA colonization surveillance program. It is not intended to diagnose MRSA infection nor to guide or monitor treatment for MRSA infections. Performed at [facility], [address] [place], [place] [address]     Coagulation Studies: No results for input(s): LABPROT, INR in the last 72 hours.  Urinalysis: No results for input(s): COLORURINE, LABSPEC, PHURINE, GLUCOSEU, HGBUR, BILIRUBINUR, KETONESUR, PROTEINUR, UROBILINOGEN, NITRITE, LEUKOCYTESUR in the last 72 hours.  Invalid input(s): APPERANCEUR    Imaging: No results found.   Medications:   . sodium chloride     . amLODipine  10 mg Oral Daily  . Chlorhexidine Gluconate Cloth  6 each Topical Q0600  . citalopram  20 mg Oral Daily  . heparin  5,000 Units Subcutaneous Q8H  . hydrALAZINE  25 mg Oral Q8H  . insulin aspart  0-15 Units Subcutaneous TID WC  . insulin aspart  0-5 Units Subcutaneous QHS  . ipratropium-albuterol  3 mL Nebulization Once  . metoprolol tartrate  50 mg Oral BID  . pravastatin  10 mg Oral q1800  . sodium chloride flush  3 mL Intravenous Q12H  . torsemide  20 mg Oral Daily  . tuberculin  5 Units Intradermal Once  . Vitamin D (Ergocalciferol)  50,000 Units Oral Weekly     Assessment/ Plan:  63 y.o. African-American male with diabetes mellitus type II, hypertension, diabetes mellitus type II, COPD, BPH, hyperlipidemia, who presents for acute shortness of breath and hyperkalemia  1.  Acute renal failure on CKD 4.   Baseline creatinine 3.86, GFR 18 on January 06, 2018.  [facility] nephrology 2.  Acute pulm edema 3.  Hyperkalemia 4.  Hypertension with CKD 5.  Diabetes type 2 with chronic kidney disease.  Hemoglobin A1c 8.5% January 07, 2018 6.  Secondary  hyperparathyroidism.  PTH 214 on September 15, 2017  Recurrent admission for volume/potassium related problems.  For now, we will set him up as outpatient as AKI dialysis.  Depending on outpatient course, further decisions can be made. Blood pressure and volume control have improved.  Potassium level has improved. Discharge planning for [place] dialysis center with [facility] nephrology    LOS: 2 Daniel Thomas 1/12/202011:28 AM  [place], [place]  Note: This note was prepared with Dragon dictation. Any transcription errors are unintentional

## 2018-01-14 NOTE — Progress Notes (Signed)
[place] [facility] [place] NAME: Daniel Thomas    MR#:  [id number]  DATE OF BIRTH:  06/14/1955  SUBJECTIVE:   No acute events overnight, plan for hemodialysis tomorrow.  No complaints presently.  REVIEW OF SYSTEMS:    Review of Systems  Constitutional: Negative for chills and fever.  HENT: Negative for congestion and tinnitus.   Eyes: Negative for blurred vision and double vision.  Respiratory: Negative for cough, shortness of breath and wheezing.   Cardiovascular: Negative for chest pain, orthopnea and PND.  Gastrointestinal: Negative for abdominal pain, diarrhea, nausea and vomiting.  Genitourinary: Negative for dysuria and hematuria.  Neurological: Negative for dizziness, sensory change and focal weakness.  All other systems reviewed and are negative.    Nutrition: Renal/Carb modified Tolerating Diet: Yes Tolerating PT: Await Eval.   DRUG ALLERGIES:  No Known Allergies  VITALS:  Blood pressure (!) 155/71, pulse 79, temperature 99 F (37.2 C), temperature source Oral, resp. rate 18, height 4\' 11"  (1.499 m), weight 73.9 kg, SpO2 99 %.  PHYSICAL EXAMINATION:   Physical Exam  GENERAL:  63 y.o.-year-old patient lying in bed in no acute distress.  EYES: Pupils equal, round, reactive to light and accommodation. No scleral icterus. Extraocular muscles intact.  HEENT: Head atraumatic, normocephalic. Oropharynx and nasopharynx clear.  NECK:  Supple, no jugular venous distention. No thyroid enlargement, no tenderness.  LUNGS: Normal breath sounds bilaterally, no wheezing, rales, rhonchi. No use of accessory muscles of respiration.  CARDIOVASCULAR: S1, S2 normal. No murmurs, rubs, or gallops.  ABDOMEN: Soft, nontender, nondistended. Bowel sounds present. No organomegaly or mass.  EXTREMITIES: No cyanosis, clubbing, Trace edema b/l.    NEUROLOGIC: Cranial nerves II through XII are intact. No focal Motor or sensory deficits b/l.     PSYCHIATRIC: The patient is alert and oriented x 3.  SKIN: No obvious rash, lesion, or ulcer.   Right chest wall perm-cath in place.   LABORATORY PANEL:   CBC Recent Labs  Lab 01/13/18 0508  WBC 14.1*  HGB 9.1*  HCT 28.2*  PLT 385   ------------------------------------------------------------------------------------------------------------------  Chemistries  Recent Labs  Lab 01/14/18 0323  NA 139  K 4.1  CL 101  CO2 28  GLUCOSE 138*  BUN 47*  CREATININE 3.44*  CALCIUM 8.1*   ------------------------------------------------------------------------------------------------------------------  Cardiac Enzymes Recent Labs  Lab 01/12/18 0650  TROPONINI 0.03*   ------------------------------------------------------------------------------------------------------------------  RADIOLOGY:  No results found.   ASSESSMENT AND PLAN:   63 year old male with stage IV chronic kidney disease with most recent discharge from the hospital for acute diastolic heart failure and accelerated hypertension whohas now approaching end-stage renal disease requiring hemodialysis who presented to the emergency room due to shortness of breath.  1. Acute on chronic stage IV chronic kidney disease: Patient is now approaching end-stage renal disease.   -Patient did not improve aggressive oral diuretics and therefore was readmitted and now started on hemodialysis.  Continue care as per nephrology.  Patient needs an outpatient spot for dialysis.  Had dialysis the past 2 days.  Volume status is improved.  2. Hyperkalemia-secondary to acute on chronic renal failure.  Improved and resolved with dialysis.    3. Hypertension: Continue Norvasc, hydralazine, metoprolol PRN hydralazine. BP stable.   4. Chronic diastolic heart failure: Continue torsemide   5. Diabetes: Continue with ADA diet, sliding scale management as per diabetes nurse  6. History of elevated PSA: Patient will  follow-up with urology as outpatient for  prostate biopsy  7. Hyperlipidemia: Continue statin  8. Depression: Continue Celexa  Possible d/c tomorrow after HD and has outpatient spot for HD.    All the records are reviewed and case discussed with Care Management/Social Worker. Management plans discussed with the patient, family and they are in agreement.  CODE STATUS: full code  DVT Prophylaxis: Hep. SQ  TOTAL TIME TAKING CARE OF THIS PATIENT: 30 minutes.   POSSIBLE D/C IN 1-2 DAYS, DEPENDING ON CLINICAL CONDITION.   Henreitta Leber M.D on 01/14/2018 at 12:53 PM  Between 7am to 6pm - Pager - [phone]  After 6pm go to [email or web address] - password [email or web address] [facility]  Office  [phone]  CC: Primary care physician; Marguerita Merles, MD

## 2018-01-15 ENCOUNTER — Inpatient Hospital Stay: Payer: BLUE CROSS/BLUE SHIELD

## 2018-01-15 ENCOUNTER — Encounter: Payer: Self-pay | Admitting: Vascular Surgery

## 2018-01-15 LAB — GLUCOSE, CAPILLARY
GLUCOSE-CAPILLARY: 122 mg/dL — AB (ref 70–99)
Glucose-Capillary: 282 mg/dL — ABNORMAL HIGH (ref 70–99)
Glucose-Capillary: 82 mg/dL (ref 70–99)
Glucose-Capillary: 131 mg/dL — ABNORMAL HIGH (ref 70–99)

## 2018-01-15 NOTE — Progress Notes (Signed)
[facility], [place] 01/15/18  Subjective:  Patient seen and evaluated during hemodialysis. Tolerating well thus far. On dialysis for acute renal failure. PSA also noted to be quite elevated at 19.  Objective:  Vital signs in last 24 hours:  Temp:  [98 F (36.7 C)-99 F (37.2 C)] 98.1 F (36.7 C) (01/13 1016) Pulse Rate:  [66-82] 77 (01/13 1100) Resp:  [10-19] 19 (01/13 1100) BP: (147-171)/(64-82) 170/76 (01/13 1100) SpO2:  [99 %-100 %] 100 % (01/13 1100) Weight:  [73.9 kg-75 kg] 75 kg (01/13 1016)  Weight change: -1.864 kg Filed Weights   01/14/18 0358 01/15/18 0323 01/15/18 1016  Weight: 73.9 kg 73.9 kg 75 kg    Intake/Output:    Intake/Output Summary (Last 24 hours) at 01/15/2018 1118 Last data filed at 01/15/2018 0931 Gross per 24 hour  Intake 363 ml  Output 950 ml  Net -587 ml     Physical Exam: General:  No acute distress, laying in the bed  HEENT  moist oral mucous membranes  Neck  supple  Pulm/lungs  room air, mild basilar crackles  CVS/Heart  regular, no rub  Abdomen:   Soft, nontender  Extremities:  Trace edema  Neurologic:  Alert, oriented  Skin:  No acute rashes    Right IJ tunneled dialysis catheter       Basic Metabolic Panel:  Recent Labs  Lab 01/12/18 0650 01/13/18 0508 01/14/18 0323  NA 134* 134* 139  K 5.9* 5.0 4.1  CL 106 104 101  CO2 20* 20* 28  GLUCOSE 142* 203* 138*  BUN 89* 78* 47*  CREATININE 4.49* 4.34* 3.44*  CALCIUM 8.6* 8.1* 8.1*     CBC: Recent Labs  Lab 01/12/18 0650 01/13/18 0508  WBC 10.5 14.1*  NEUTROABS 7.4  --   HGB 10.5* 9.1*  HCT 32.9* 28.2*  MCV 89.9 87.3  PLT 464* 385      Lab Results  Component Value Date   HEPBSAG Negative 01/12/2018   HEPBSAB Non Reactive 01/12/2018   HEPBIGM Negative 01/12/2018      Microbiology:  Recent Results (from the past 240 hour(s))  MRSA PCR Screening     Status: None   Collection Time: 01/06/18 10:49 PM  Result Value Ref  Range Status   MRSA by PCR NEGATIVE NEGATIVE Final    Comment:        The GeneXpert MRSA Assay (FDA approved for NASAL specimens only), is one component of a comprehensive MRSA colonization surveillance program. It is not intended to diagnose MRSA infection nor to guide or monitor treatment for MRSA infections. Performed at [facility], [address] [place], [place] [address]     Coagulation Studies: No results for input(s): LABPROT, INR in the last 72 hours.  Urinalysis: No results for input(s): COLORURINE, LABSPEC, PHURINE, GLUCOSEU, HGBUR, BILIRUBINUR, KETONESUR, PROTEINUR, UROBILINOGEN, NITRITE, LEUKOCYTESUR in the last 72 hours.  Invalid input(s): APPERANCEUR    Imaging: No results found.   Medications:   . sodium chloride     . amLODipine  10 mg Oral Daily  . Chlorhexidine Gluconate Cloth  6 each Topical Q0600  . citalopram  20 mg Oral Daily  . heparin  5,000 Units Subcutaneous Q8H  . hydrALAZINE  25 mg Oral Q8H  . insulin aspart  0-15 Units Subcutaneous TID WC  . insulin aspart  0-5 Units Subcutaneous QHS  . ipratropium-albuterol  3 mL Nebulization Once  . metoprolol tartrate  50 mg Oral BID  . pravastatin  10 mg Oral q1800  . sodium chloride flush  3 mL Intravenous Q12H  . torsemide  20 mg Oral Daily  . Vitamin D (Ergocalciferol)  50,000 Units Oral Weekly     Assessment/ Plan:  63 y.o. African-American male with diabetes mellitus type II, hypertension, diabetes mellitus type II, COPD, BPH, hyperlipidemia, who presents for acute shortness of breath and hyperkalemia  1.  Acute renal failure on CKD 4.   Baseline creatinine 3.86, GFR 18 on January 06, 2018.  [facility] nephrology 2.  Acute pulm edema 3.  Hyperkalemia 4.  Hypertension with CKD 5.  Diabetes type 2 with chronic kidney disease.  Hemoglobin A1c 8.5% January 07, 2018 6.  Secondary hyperparathyroidism.  PTH 214 on September 15, 2017 7.  Elevated PSA of 19.   Plan:  Patient seen and  evaluated during hemodialysis treatment.  Tolerating well thus far.  We a long discussion with the patient and his family.  Patient on dialysis temporarily for now for acute renal failure.  We will check renal ultrasound to make sure there is no underlying obstruction as his PSA has doubled to 19.  We will also request urology consultation at the request of the patient and his family.  Urine output yesterday was 950 cc.  Further plan as patient progresses.    LOS: 3 Daniel Thomas 1/13/202011:18 AM  [place], [place]  Note: This note was prepared with Dragon dictation. Any transcription errors are unintentional

## 2018-01-15 NOTE — Progress Notes (Signed)
Pre HD assessment   01/15/18 1016  Vital Signs  Temp 98.1 F (36.7 C)  Temp Source Oral  Pulse Rate 76  Pulse Rate Source Monitor  Resp 12  BP (!) 153/78  BP Location Left Arm  BP Method Automatic  Patient Position (if appropriate) Sitting  Oxygen Therapy  SpO2 100 %  O2 Device Room Air  Pain Assessment  Pain Scale 0-10  Pain Score 0  Dialysis Weight  Weight 75 kg  Type of Weight Pre-Dialysis  Time-Out for Hemodialysis  What Procedure? HD  Pt Identifiers(min of two) First/Last Name;MRN/Account#  Correct Site? Yes  Correct Side? Yes  Correct Procedure? Yes  Consents Verified? Yes  Rad Studies Available? N/A  Safety Precautions Reviewed? Yes  Research scientist (physical sciences)  (2A)  Station Number 1  UF/Alarm Test Passed  Conductivity: Meter 14  Conductivity: Machine  14.1  pH 7.6  Reverse Osmosis main  Normal Saline Lot Number [id number]  Dialyzer Lot Number [id number]  Disposable Set Lot Number [id number]  Machine Temperature 98.6 F (37 C)  Musician and Audible Yes  Blood Lines Intact and Secured Yes  Pre Treatment Patient Checks  Vascular access used during treatment Catheter  Hepatitis B Surface Antigen Results Negative  Date Hepatitis B Surface Antigen Drawn 01/12/18  Hepatitis B Surface Antibody  (<10)  Date Hepatitis B Surface Antibody Drawn 01/12/18  Hemodialysis Consent Verified Yes  Hemodialysis Standing Orders Initiated Yes  ECG (Telemetry) Monitor On Yes  Prime Ordered Normal Saline  Length of  DialysisTreatment -hour(s) 3 Hour(s)  Dialyzer Elisio 17H NR  Dialysate 3K, 2.5 Ca  Dialysis Anticoagulant None  Dialysate Flow Ordered 600  Blood Flow Rate Ordered 300 mL/min  Ultrafiltration Goal 0.5 Liters  Dialysis Blood Pressure Support Ordered Normal Saline  Education / Care Plan  Dialysis Education Provided Yes  Documented Education in Care Plan Yes  Hemodialysis Catheter Right Internal jugular  Placement Date/Time: 01/12/18 1118   Placed  prior to admission: Yes  Time Out: Correct patient;Correct site;Correct procedure  Maximum sterile barrier precautions: Hand hygiene;Sterile gloves;Cap;Large sterile sheet;Mask;Sterile gown  Site Prep: Chlor...  Site Condition No complications  Blue Lumen Status Heparin locked  Red Lumen Status Heparin locked  Purple Lumen Status N/A  Dressing Type Biopatch  Dressing Status Clean;Dry;Intact  Drainage Description None

## 2018-01-15 NOTE — Progress Notes (Signed)
Post HD assessment. Pt tolerated tx well without complication. Pt c/o brief chest pain in the center of his chest which quick subsided, MD aware. Pt has no c/o at this time. Net UF 506, goal met.    01/15/18 1329  Vital Signs  Temp 98 F (36.7 C)  Temp Source Oral  Pulse Rate 78  Pulse Rate Source Monitor  Resp 16  BP (!) 161/69  BP Location Left Arm  BP Method Automatic  Patient Position (if appropriate) Lying  Oxygen Therapy  SpO2 100 %  O2 Device Room Air  Dialysis Weight  Weight 73.8 kg  Type of Weight Post-Dialysis  Post-Hemodialysis Assessment  Rinseback Volume (mL) 250 mL  KECN 50.2 V  Dialyzer Clearance Lightly streaked  Duration of HD Treatment -hour(s) 3 hour(s)  Hemodialysis Intake (mL) 500 mL  UF Total -Machine (mL) 1006 mL  Net UF (mL) 506 mL  Tolerated HD Treatment Yes  Education / Care Plan  Dialysis Education Provided Yes  Documented Education in Care Plan Yes  Hemodialysis Catheter Right Internal jugular  Placement Date/Time: 01/12/18 1118   Placed prior to admission: Yes  Time Out: Correct patient;Correct site;Correct procedure  Maximum sterile barrier precautions: Hand hygiene;Sterile gloves;Cap;Large sterile sheet;Mask;Sterile gown  Site Prep: Chlor...  Site Condition No complications  Blue Lumen Status Heparin locked  Red Lumen Status Heparin locked  Purple Lumen Status N/A  Catheter fill solution Heparin 1000 units/ml  Catheter fill volume (Arterial) 1.5 cc  Catheter fill volume (Venous) 1.5  Dressing Type Biopatch  Dressing Status Clean;Dry;Intact  Drainage Description None  Post treatment catheter status Capped and Clamped

## 2018-01-15 NOTE — Plan of Care (Signed)
  Problem: Activity: Goal: Risk for activity intolerance will decrease Outcome: Progressing Note:  Independent in room   Problem: Elimination: Goal: Will not experience complications related to bowel motility Outcome: Progressing Note:  BM this shift after PRN senna and prune juice given Goal: Will not experience complications related to urinary retention Outcome: Progressing   Problem: Pain Managment: Goal: General experience of comfort will improve Outcome: Progressing Note:  No complaints of pain this shift   Problem: Safety: Goal: Ability to remain free from injury will improve Outcome: Progressing   Problem: Education: Goal: Knowledge of General Education information will improve Description Including pain rating scale, medication(s)/side effects and non-pharmacologic comfort measures Outcome: Completed/Met   Problem: Nutrition: Goal: Adequate nutrition will be maintained Outcome: Completed/Met   Problem: Coping: Goal: Level of anxiety will decrease Outcome: Completed/Met

## 2018-01-15 NOTE — Progress Notes (Signed)
Post HD Assessment    01/15/18 1327  Neurological  Level of Consciousness Alert  Orientation Level Oriented X4  Respiratory  Respiratory Pattern Regular;Unlabored  Chest Assessment Chest expansion symmetrical  Cardiac  Pulse Regular  ECG Monitor Yes  Cardiac Rhythm NSR  Vascular  R Radial Pulse +2  L Radial Pulse +2  Edema Generalized  Integumentary  Integumentary (WDL) X  Skin Color Appropriate for ethnicity  Musculoskeletal  Musculoskeletal (WDL) X  Generalized Weakness Yes  Assistive Device None  GU Assessment  Genitourinary (WDL) X  Genitourinary Symptoms  (HD)  Psychosocial  Psychosocial (WDL) WDL

## 2018-01-15 NOTE — Progress Notes (Signed)
[place] at [place] NAME: Daniel Thomas    MR#:  [id number]  DATE OF BIRTH:  02/12/1955  SUBJECTIVE:   No acute events overnight, plan for dialysis today.  Patient's family is requesting urology consultation given his elevated PSA.  Renal ultrasound ordered by nephrology to rule out obstruction.  REVIEW OF SYSTEMS:    Review of Systems  Constitutional: Negative for chills and fever.  HENT: Negative for congestion and tinnitus.   Eyes: Negative for blurred vision and double vision.  Respiratory: Negative for cough, shortness of breath and wheezing.   Cardiovascular: Negative for chest pain, orthopnea and PND.  Gastrointestinal: Negative for abdominal pain, diarrhea, nausea and vomiting.  Genitourinary: Negative for dysuria and hematuria.  Neurological: Negative for dizziness, sensory change and focal weakness.  All other systems reviewed and are negative.    Nutrition: Renal/Carb modified Tolerating Diet: Yes Tolerating PT: Ambulatory  DRUG ALLERGIES:  No Known Allergies  VITALS:  Blood pressure (!) 167/92, pulse 76, temperature 98 F (36.7 C), temperature source Oral, resp. rate 18, height 4\' 11"  (1.499 m), weight 73.8 kg, SpO2 98 %.  PHYSICAL EXAMINATION:   Physical Exam  GENERAL:  63 y.o.-year-old patient lying in bed in no acute distress.  EYES: Pupils equal, round, reactive to light and accommodation. No scleral icterus. Extraocular muscles intact.  HEENT: Head atraumatic, normocephalic. Oropharynx and nasopharynx clear.  NECK:  Supple, no jugular venous distention. No thyroid enlargement, no tenderness.  LUNGS: Normal breath sounds bilaterally, no wheezing, rales, rhonchi. No use of accessory muscles of respiration.  CARDIOVASCULAR: S1, S2 normal. No murmurs, rubs, or gallops.  ABDOMEN: Soft, nontender, nondistended. Bowel sounds present. No organomegaly or mass.  EXTREMITIES: No cyanosis, clubbing, Trace edema b/l.      NEUROLOGIC: Cranial nerves II through XII are intact. No focal Motor or sensory deficits b/l.   PSYCHIATRIC: The patient is alert and oriented x 3.  SKIN: No obvious rash, lesion, or ulcer.   Right chest wall perm-cath in place.   LABORATORY PANEL:   CBC Recent Labs  Lab 01/13/18 0508  WBC 14.1*  HGB 9.1*  HCT 28.2*  PLT 385   ------------------------------------------------------------------------------------------------------------------  Chemistries  Recent Labs  Lab 01/14/18 0323  NA 139  K 4.1  CL 101  CO2 28  GLUCOSE 138*  BUN 47*  CREATININE 3.44*  CALCIUM 8.1*   ------------------------------------------------------------------------------------------------------------------  Cardiac Enzymes Recent Labs  Lab 01/12/18 0650  TROPONINI 0.03*   ------------------------------------------------------------------------------------------------------------------  RADIOLOGY:  No results found.   ASSESSMENT AND PLAN:   63 year old male with stage IV chronic kidney disease with most recent discharge from the hospital for acute diastolic heart failure and accelerated hypertension whohas now approaching end-stage renal disease requiring hemodialysis who presented to the emergency room due to shortness of breath.  1. Acute on chronic stage IV chronic kidney disease: Patient is now approaching end-stage renal disease.  -Patient did not improve aggressive oral diuretics and therefore was readmitted and now started on hemodialysis.  Continue care as per nephrology.   Nephrology still doing this is acute kidney injury and patient may not need permanent dialysis. - Plan for dialysis session today and patient still awaiting outpatient spot for hemodialysis.  2. Hyperkalemia-secondary to acute on chronic renal failure.  Improved and resolved with dialysis.    3. Hypertension: Continue Norvasc, hydralazine, metoprolol PRN hydralazine. BP stable.   4. Chronic  diastolic heart failure: Continue torsemide   5. Diabetes:  Continue with ADA diet, sliding scale management as per diabetes nurse  6. History of elevated PSA: Patient's family is requesting urological consultation during hospitalization.  Urology consult is currently pending.  Renal ultrasound pending to rule out obstruction.  7. Hyperlipidemia: Continue statin  8. Depression: Continue Celexa  Discussed plan of care with Nephrology over the phone.    All the records are reviewed and case discussed with Care Management/Social Worker. Management plans discussed with the patient, family and they are in agreement.  CODE STATUS: full code  DVT Prophylaxis: Hep. SQ  TOTAL TIME TAKING CARE OF THIS PATIENT: 30 minutes.   POSSIBLE D/C IN 1-2 DAYS, DEPENDING ON CLINICAL CONDITION.   Henreitta Leber M.D on 01/15/2018 at 2:45 PM  Between 7am to 6pm - Pager - [phone]  After 6pm go to [email or web address] - password [email or web address] [facility]  Office  [phone]  CC: Primary care physician; Marguerita Merles, MD

## 2018-01-15 NOTE — Progress Notes (Signed)
RN unavailable to give report prior to HD tx, RN to call this RN back to give report.    01/15/18 0945  Hand-Off documentation  Report given to (Full Name) Stark Bray  Report received from (Full Name) Tyrone Sage

## 2018-01-15 NOTE — Progress Notes (Signed)
Pre HD assessment     01/15/18 1017  Neurological  Level of Consciousness Alert  Orientation Level Oriented X4  Respiratory  Respiratory Pattern Regular;Unlabored  Chest Assessment Chest expansion symmetrical  Cardiac  Pulse Regular  ECG Monitor Yes  Cardiac Rhythm NSR  Vascular  R Radial Pulse +2  L Radial Pulse +2  Integumentary  Integumentary (WDL) X  Skin Color Appropriate for ethnicity  Musculoskeletal  Musculoskeletal (WDL) X  Generalized Weakness Yes  Assistive Device None  GU Assessment  Genitourinary (WDL) X  Genitourinary Symptoms  (HD)  Psychosocial  Psychosocial (WDL) WDL

## 2018-01-15 NOTE — Progress Notes (Signed)
TB test to left forearm read, no redness, swelling, a small bruise at insertion site. Negative TB skin test.  Conley Simmonds, RN, BSN

## 2018-01-15 NOTE — Progress Notes (Signed)
HD tx start    01/15/18 1021  Vital Signs  Pulse Rate 73  Pulse Rate Source Monitor  Resp 12  BP (!) 154/67  BP Location Left Arm  BP Method Automatic  Patient Position (if appropriate) Lying  Oxygen Therapy  SpO2 99 %  O2 Device Room Air  During Hemodialysis Assessment  Blood Flow Rate (mL/min) 300 mL/min  Arterial Pressure (mmHg) -100 mmHg  Venous Pressure (mmHg) 90 mmHg  Transmembrane Pressure (mmHg) 60 mmHg  Ultrafiltration Rate (mL/min) 330 mL/min  Dialysate Flow Rate (mL/min) 600 ml/min  Conductivity: Machine  14.3  HD Safety Checks Performed Yes  Dialysis Fluid Bolus Normal Saline  Bolus Amount (mL) 250 mL  Intra-Hemodialysis Comments Tx initiated  Hemodialysis Catheter Right Internal jugular  Placement Date/Time: 01/12/18 1118   Placed prior to admission: Yes  Time Out: Correct patient;Correct site;Correct procedure  Maximum sterile barrier precautions: Hand hygiene;Sterile gloves;Cap;Large sterile sheet;Mask;Sterile gown  Site Prep: Chlor...  Blue Lumen Status Infusing  Red Lumen Status Infusing

## 2018-01-15 NOTE — Progress Notes (Signed)
HD tx end    01/15/18 1323  Vital Signs  Pulse Rate 70  Pulse Rate Source Monitor  Resp 14  BP (!) 148/66  BP Location Left Arm  BP Method Automatic  Patient Position (if appropriate) Lying  Oxygen Therapy  SpO2 99 %  O2 Device Room Air  During Hemodialysis Assessment  Dialysis Fluid Bolus Normal Saline  Bolus Amount (mL) 250 mL  Intra-Hemodialysis Comments Tx completed

## 2018-01-16 DIAGNOSIS — N400 Enlarged prostate without lower urinary tract symptoms: Secondary | ICD-10-CM | POA: Insufficient documentation

## 2018-01-16 DIAGNOSIS — J449 Chronic obstructive pulmonary disease, unspecified: Secondary | ICD-10-CM | POA: Insufficient documentation

## 2018-01-16 LAB — RENAL FUNCTION PANEL
ALBUMIN: 3.6 g/dL (ref 3.5–5.0)
Anion gap: 9 (ref 5–15)
BUN: 37 mg/dL — ABNORMAL HIGH (ref 8–23)
CO2: 28 mmol/L (ref 22–32)
Calcium: 8.4 mg/dL — ABNORMAL LOW (ref 8.9–10.3)
Chloride: 99 mmol/L (ref 98–111)
Creatinine, Ser: 3.12 mg/dL — ABNORMAL HIGH (ref 0.61–1.24)
GFR calc Af Amer: 24 mL/min — ABNORMAL LOW (ref >60)
GFR calc non Af Amer: 20 mL/min — ABNORMAL LOW (ref >60)
Glucose, Bld: 152 mg/dL — ABNORMAL HIGH (ref 70–99)
Phosphorus: 3.6 mg/dL (ref 2.5–4.6)
Potassium: 3.8 mmol/L (ref 3.5–5.1)
Sodium: 136 mmol/L (ref 135–145)

## 2018-01-16 LAB — GLUCOSE, CAPILLARY
Glucose-Capillary: 122 mg/dL — ABNORMAL HIGH (ref 70–99)
Glucose-Capillary: 199 mg/dL — ABNORMAL HIGH (ref 70–99)
Glucose-Capillary: 218 mg/dL — ABNORMAL HIGH (ref 70–99)

## 2018-01-16 NOTE — Progress Notes (Signed)
[place], [place] 01/16/18  Subjective:  Patient seen at bedside. Completed dialysis yesterday. Urology has him scheduled for prostate biopsy in February.    Objective:  Vital signs in last 24 hours:  Temp:  [98.2 F (36.8 C)-99.2 F (37.3 C)] 98.2 F (36.8 C) (01/14 0740) Pulse Rate:  [73-85] 82 (01/14 0740) Resp:  [18] 18 (01/14 0740) BP: (119-168)/(59-80) 168/80 (01/14 0740) SpO2:  [96 %-97 %] 97 % (01/14 0740) Weight:  [74.4 kg] 74.4 kg (01/14 0410)  Weight change: 1.064 kg Filed Weights   01/15/18 1016 01/15/18 1329 01/16/18 0410  Weight: 75 kg 73.8 kg 74.4 kg    Intake/Output:    Intake/Output Summary (Last 24 hours) at 01/16/2018 1445 Last data filed at 01/16/2018 1008 Gross per 24 hour  Intake 366 ml  Output 900 ml  Net -534 ml     Physical Exam: General:  No acute distress, laying in the bed  HEENT  moist oral mucous membranes  Neck  supple  Pulm/lungs  room air, CTAB  CVS/Heart  regular, no rub  Abdomen:   Soft, nontender  Extremities:  Trace edema  Neurologic:  Alert, oriented  Skin:  No acute rashes    Right IJ tunneled dialysis catheter       Basic Metabolic Panel:  Recent Labs  Lab 01/12/18 0650 01/13/18 0508 01/14/18 0323 01/16/18 0951  NA 134* 134* 139 136  K 5.9* 5.0 4.1 3.8  CL 106 104 101 99  CO2 20* 20* 28 28  GLUCOSE 142* 203* 138* 152*  BUN 89* 78* 47* 37*  CREATININE 4.49* 4.34* 3.44* 3.12*  CALCIUM 8.6* 8.1* 8.1* 8.4*  PHOS  --   --   --  3.6     CBC: Recent Labs  Lab 01/12/18 0650 01/13/18 0508  WBC 10.5 14.1*  NEUTROABS 7.4  --   HGB 10.5* 9.1*  HCT 32.9* 28.2*  MCV 89.9 87.3  PLT 464* 385      Lab Results  Component Value Date   HEPBSAG Negative 01/12/2018   HEPBSAB Non Reactive 01/12/2018   HEPBIGM Negative 01/12/2018      Microbiology:  Recent Results (from the past 240 hour(s))  MRSA PCR Screening     Status: None   Collection Time: 01/06/18 10:49 PM  Result  Value Ref Range Status   MRSA by PCR NEGATIVE NEGATIVE Final    Comment:        The GeneXpert MRSA Assay (FDA approved for NASAL specimens only), is one component of a comprehensive MRSA colonization surveillance program. It is not intended to diagnose MRSA infection nor to guide or monitor treatment for MRSA infections. Performed at [facility], [address] [place], [place] [address]     Coagulation Studies: No results for input(s): LABPROT, INR in the last 72 hours.  Urinalysis: No results for input(s): COLORURINE, LABSPEC, PHURINE, GLUCOSEU, HGBUR, BILIRUBINUR, KETONESUR, PROTEINUR, UROBILINOGEN, NITRITE, LEUKOCYTESUR in the last 72 hours.  Invalid input(s): APPERANCEUR    Imaging: US Renal  Result Date: 01/15/2018 CLINICAL DATA:  Acute renal failure. EXAM: RENAL / URINARY TRACT ULTRASOUND COMPLETE COMPARISON:  No recent prior. FINDINGS: Right Kidney: Renal measurements: 9.9 x 5.8 x 5.6 cm. = volume: 167.2 mL. Increased echogenicity suggesting chronic medical renal disease. No hydronephrosis. 1.4 cm and 0.8 cm simple cysts. Trace amount of fluid noted about the right kidney. Left Kidney: Renal measurements: 9.2 x 6.3 x 5.7 cm = volume: 172.3 mL. Increased echogenicity suggesting chronic medical  renal disease. No hydronephrosis. 1.6 cm and 1.1 cm simple cysts. Trace amount of fluid noted about the left kidney. Bladder: Appears normal for degree of bladder distention. IMPRESSION: 1.  Bilateral simple renal cysts. 2. Increased echogenicity both kidneys consistent with chronic medical renal disease. Trace amount of fluid noted about the kidneys. No hydronephrosis or bladder distention. Electronically Signed   By: Marcello Moores  Register   On: 01/15/2018 14:55     Medications:   . sodium chloride     . amLODipine  10 mg Oral Daily  . Chlorhexidine Gluconate Cloth  6 each Topical Q0600  . citalopram  20 mg Oral Daily  . heparin  5,000 Units Subcutaneous Q8H  . hydrALAZINE   25 mg Oral Q8H  . insulin aspart  0-15 Units Subcutaneous TID WC  . insulin aspart  0-5 Units Subcutaneous QHS  . ipratropium-albuterol  3 mL Nebulization Once  . metoprolol tartrate  50 mg Oral BID  . pravastatin  10 mg Oral q1800  . sodium chloride flush  3 mL Intravenous Q12H  . torsemide  20 mg Oral Daily  . Vitamin D (Ergocalciferol)  50,000 Units Oral Weekly     Assessment/ Plan:  63 y.o. African-American male with diabetes mellitus type II, hypertension, diabetes mellitus type II, COPD, BPH, hyperlipidemia, who presents for acute shortness of breath and hyperkalemia  1.  Acute renal failure on CKD 4.   Baseline creatinine 3.86, GFR 18 on January 06, 2018.  [facility] nephrology 2.  Acute pulm edema 3.  Hyperkalemia 4.  Hypertension with CKD 5.  Diabetes type 2 with chronic kidney disease.  Hemoglobin A1c 8.5% January 07, 2018 6.  Secondary hyperparathyroidism.  PTH 214 on September 15, 2017 7.  Elevated PSA of 19.   Plan:  Patient still considered to have acute renal failure with chronic kidney disease stage IV.  He has been initiated on dialysis for the acute component.  He will continue ontemporary hemodialysis until he is further evaluated by [facility] nephrology as an outpatient.  He will be having dialysis at [facility] starting on Thursday.  In addition he has elevated PSA.  He has a prostate biopsy scheduled in February.    LOS: 4 Dwana Garin 1/14/20202:45 PM  [place], [place]  Note: This note was prepared with Dragon dictation. Any transcription errors are unintentional

## 2018-01-16 NOTE — Plan of Care (Signed)
  Problem: Activity: Goal: Risk for activity intolerance will decrease Outcome: Progressing Note:  Independent in room   Problem: Elimination: Goal: Will not experience complications related to bowel motility Outcome: Progressing Note:  Sennokot given for constipation  Goal: Will not experience complications related to urinary retention Outcome: Progressing   Problem: Pain Managment: Goal: General experience of comfort will improve Outcome: Progressing Note:  No complaints of pain this shift   Problem: Safety: Goal: Ability to remain free from injury will improve Outcome: Progressing   Problem: Skin Integrity: Goal: Risk for impaired skin integrity will decrease Outcome: Progressing

## 2018-01-16 NOTE — Care Management Note (Signed)
Case Management Note  Patient Details  Name: Daniel Thomas MRN: [id number] Date of Birth: [birth year]  Subjective/Objective:   Patient is from home alone.  He will be discharging today to home.  He does not qualify for home health services as he works and is independent.  He has a chair time at 11:40 and verbalizes understanding he needs to arrive at 11:00 at [facility] for dialysis on TTS.  He called his brother for arrangements for discharge.  He is currently at work.  This RNCM left patients brother [name] Sebesta a message explaining his brother will be discharged this afternoon and he would like him to transport to home.              Action/Plan:   Expected Discharge Date:  01/16/18               Expected Discharge Plan:  Home/Self Care  In-House Referral:     Discharge planning Services  CM Consult  Post Acute Care Choice:    Choice offered to:     DME Arranged:    DME Agency:     HH Arranged:    HH Agency:     Status of Service:  Completed, signed off  If discussed at [employer] of Stay Meetings, dates discussed:    Additional Comments:  Elza Rafter, RN 01/16/2018, 3:43 PM

## 2018-01-16 NOTE — Discharge Summary (Signed)
[place] at [place] NAME: Daniel Thomas    MR#:  [id number]  DATE OF BIRTH:  03/14/1955  DATE OF ADMISSION:  01/12/2018 ADMITTING PHYSICIAN: Bettey Costa, MD  DATE OF DISCHARGE: 01/16/2018  PRIMARY CARE PHYSICIAN: Marguerita Merles, MD    ADMISSION DIAGNOSIS:  Hyperkalemia [E87.5] ESRD (end stage renal disease) on dialysis ([place]) [N18.6, Z99.2] Acute kidney injury superimposed on chronic kidney disease ([place]) [N17.9, N18.9] Acute on chronic congestive heart failure, unspecified heart failure type ([place]) [I50.9]  DISCHARGE DIAGNOSIS:  Active Problems:   Hyperkalemia   SECONDARY DIAGNOSIS:   Past Medical History:  Diagnosis Date  . COPD (chronic obstructive pulmonary disease) ([place])   . Diabetes mellitus without complication ([place])   . Enlarged prostate   . Heart murmur   . High cholesterol   . Hypertension     HOSPITAL COURSE:   63 year old male with stage IV chronic kidney disease with most recent discharge from the hospital for acute diastolic heart failure and accelerated hypertension whohas now approaching end-stage renal disease requiring hemodialysis who presented to the emergency room due to shortness of breath.  1. Acute on chronic stage IV chronic kidney disease -Patient did not improve with aggressive oral diuretics and therefore was readmitted for volume overload and was started on dialysis. -Patient's volume status has improved with dialysis.  He is no longer short of breath.  He is not hypoxic.  As per nephrology patient is still has adequate urine output and this is still acute kidney injury and not necessarily ESRD. - At present patient will be discharged with a diagnosis of acute kidney injury in the hopes that he would be able to come off dialysis.  He will receive dialysis as an outpatient for the next 2 weeks and to have his nephrologist assess as an outpatient whether he needs further dialysis from there onwards.  \ -Clinically presently stable, creatinine stable.  Electrolytes are stable.  Stable to be discharged from the nephrology standpoint.  2. Hyperkalemia-secondary to acute on chronic renal failure.  Improved and resolved with dialysis.  3. Hypertension:blood Pressures were somewhat labile but improved with dialysis. -Patient will continue his Norvasc, hydralazine, metoprolol.  4. Chronic diastolic heart failure:Patient presented to the hospital with some volume overload and failed outpatient oral diuretics.  This has improved with dialysis.  Patient will continue his torsemide, hydralazine, metoprolol.  5. Diabetes:While in the hospital patient was on sliding scale insulin, he will resume his Tradjenta upon discharge.  6. History of elevated PSA: His PSA level had increased from 9-19.  Patient's family requested inpatient urologic consultation but as per urology they would not do a prostate biopsy while in the hospital and patient will follow-up with Dr. Erlene Quan as an outpatient to have biopsy done in February.  7. Hyperlipidemia: pt. Will Continue Atorvastatin  8. Depression: pt. Will Continue Celexa  DISCHARGE CONDITIONS:   Stable  CONSULTS OBTAINED:  Treatment Team:  Murlean Iba, MD Abbie Sons, MD  DRUG ALLERGIES:  No Known Allergies  DISCHARGE MEDICATIONS:   Allergies as of 01/16/2018   No Known Allergies     Medication List    TAKE these medications   amLODipine 10 MG tablet Commonly known as:  NORVASC Take 1 tablet (10 mg total) by mouth daily.   citalopram 20 MG tablet Commonly known as:  CELEXA Take 20 mg by mouth daily.   hydrALAZINE 25 MG tablet Commonly known as:  APRESOLINE Take 1 tablet (25  mg total) by mouth every 8 (eight) hours.   linagliptin 5 MG Tabs tablet Commonly known as:  TRADJENTA Take 1 tablet (5 mg total) by mouth daily.   lovastatin 40 MG tablet Commonly known as:  MEVACOR 40 mg nightly.   metoprolol  tartrate 50 MG tablet Commonly known as:  LOPRESSOR Take 1 tablet (50 mg total) by mouth 2 (two) times daily.   torsemide 20 MG tablet Commonly known as:  DEMADEX 2 tablets po daily   Vitamin D (Ergocalciferol) 1.25 MG (50000 UT) Caps capsule Commonly known as:  DRISDOL Take 50,000 Units by mouth once a week.         DISCHARGE INSTRUCTIONS:   DIET:  Cardiac diet, Diabetic diet and Renal diet  DISCHARGE CONDITION:  Stable  ACTIVITY:  Activity as tolerated  OXYGEN:  Home Oxygen: No.   Oxygen Delivery: room air  DISCHARGE LOCATION:  home   If you experience worsening of your admission symptoms, develop shortness of breath, life threatening emergency, suicidal or homicidal thoughts you must seek medical attention immediately by calling 911 or calling your MD immediately  if symptoms less severe.  You Must read complete instructions/literature along with all the possible adverse reactions/side effects for all the Medicines you take and that have been prescribed to you. Take any new Medicines after you have completely understood and accpet all the possible adverse reactions/side effects.   Please note  You were cared for by a hospitalist during your hospital stay. If you have any questions about your discharge medications or the care you received while you were in the hospital after you are discharged, you can call the unit and asked to speak with the hospitalist on call if the hospitalist that took care of you is not available. Once you are discharged, your primary care physician will handle any further medical issues. Please note that NO REFILLS for any discharge medications will be authorized once you are discharged, as it is imperative that you return to your primary care physician (or establish a relationship with a primary care physician if you do not have one) for your aftercare needs so that they can reassess your need for medications and monitor your lab  values.     Today   No acute events overnight, tolerated hemodialysis well today.  Patient has a outpatient spot for dialysis on a Tuesday Thursday Saturday schedule.  Further follow-up visit nephrology as an outpatient.  Will discharge home today.  VITAL SIGNS:  Blood pressure (!) 168/80, pulse 82, temperature 98.2 F (36.8 C), resp. rate 18, height 4\' 11"  (1.499 m), weight 74.4 kg, SpO2 97 %.  I/O:    Intake/Output Summary (Last 24 hours) at 01/16/2018 1551 Last data filed at 01/16/2018 1008 Gross per 24 hour  Intake 366 ml  Output 900 ml  Net -534 ml    PHYSICAL EXAMINATION:   GENERAL:  63 y.o.-year-old patient lying in bed in no acute distress.  EYES: Pupils equal, round, reactive to light and accommodation. No scleral icterus. Extraocular muscles intact.  HEENT: Head atraumatic, normocephalic. Oropharynx and nasopharynx clear.  NECK:  Supple, no jugular venous distention. No thyroid enlargement, no tenderness.  LUNGS: Normal breath sounds bilaterally, no wheezing, rales, rhonchi. No use of accessory muscles of respiration.  CARDIOVASCULAR: S1, S2 normal. No murmurs, rubs, or gallops.  ABDOMEN: Soft, nontender, nondistended. Bowel sounds present. No organomegaly or mass.  EXTREMITIES: No cyanosis, clubbing, Trace edema b/l.    NEUROLOGIC: Cranial nerves II through XII  are intact. No focal Motor or sensory deficits b/l.    PSYCHIATRIC: The patient is alert and oriented x 3.  SKIN: No obvious rash, lesion, or ulcer.   Right chest wall perm-cath in place.   DATA REVIEW:   CBC Recent Labs  Lab 01/13/18 0508  WBC 14.1*  HGB 9.1*  HCT 28.2*  PLT 385    Chemistries  Recent Labs  Lab 01/16/18 0951  NA 136  K 3.8  CL 99  CO2 28  GLUCOSE 152*  BUN 37*  CREATININE 3.12*  CALCIUM 8.4*    Cardiac Enzymes Recent Labs  Lab 01/12/18 0650  TROPONINI 0.03*    Microbiology Results  Results for orders placed or performed during the hospital encounter of  01/06/18  MRSA PCR Screening     Status: None   Collection Time: 01/06/18 10:49 PM  Result Value Ref Range Status   MRSA by PCR NEGATIVE NEGATIVE Final    Comment:        The GeneXpert MRSA Assay (FDA approved for NASAL specimens only), is one component of a comprehensive MRSA colonization surveillance program. It is not intended to diagnose MRSA infection nor to guide or monitor treatment for MRSA infections. Performed at [facility], [address] [place], [place] [address]     RADIOLOGY:  US Renal  Result Date: 01/15/2018 CLINICAL DATA:  Acute renal failure. EXAM: RENAL / URINARY TRACT ULTRASOUND COMPLETE COMPARISON:  No recent prior. FINDINGS: Right Kidney: Renal measurements: 9.9 x 5.8 x 5.6 cm. = volume: 167.2 mL. Increased echogenicity suggesting chronic medical renal disease. No hydronephrosis. 1.4 cm and 0.8 cm simple cysts. Trace amount of fluid noted about the right kidney. Left Kidney: Renal measurements: 9.2 x 6.3 x 5.7 cm = volume: 172.3 mL. Increased echogenicity suggesting chronic medical renal disease. No hydronephrosis. 1.6 cm and 1.1 cm simple cysts. Trace amount of fluid noted about the left kidney. Bladder: Appears normal for degree of bladder distention. IMPRESSION: 1.  Bilateral simple renal cysts. 2. Increased echogenicity both kidneys consistent with chronic medical renal disease. Trace amount of fluid noted about the kidneys. No hydronephrosis or bladder distention. Electronically Signed   By: Marcello Moores  Register   On: 01/15/2018 14:55      Management plans discussed with the patient, family and they are in agreement.  CODE STATUS:     Code Status Orders  (From admission, onward)         Start     Ordered   01/12/18 1658  Full code  Continuous     01/12/18 1658       TOTAL TIME TAKING CARE OF THIS PATIENT: 40 minutes.    Henreitta Leber M.D on 01/16/2018 at 3:51 PM  Between 7am to 6pm - Pager - [phone]  After 6pm go to  [email or web address] - password [email or web address] [facility]  Office  [phone]  CC: Primary care physician; Marguerita Merles, MD

## 2018-01-26 ENCOUNTER — Ambulatory Visit: Payer: BLUE CROSS/BLUE SHIELD | Admitting: Family

## 2018-01-26 DIAGNOSIS — D649 Anemia, unspecified: Secondary | ICD-10-CM | POA: Insufficient documentation

## 2018-02-12 ENCOUNTER — Telehealth: Payer: Self-pay | Admitting: Urology

## 2018-02-12 NOTE — Telephone Encounter (Signed)
Pt brother called office has concerns about pt having biopsy tomorrow, concerns are pt has heart condition and has been hospitalized twice since his clearance, fluid building up around heart.  Brother has concerns that the pain the pt will endure during the biopsy will cause more heart issues, they want to know what other options are available to avoid any further problems. Also stated that while pt was in hospital, they tried to get the biopsy done while admitted. Very concerned about biopsy tomorrow and would like someone to call him. Please advise. Thanks

## 2018-02-13 ENCOUNTER — Other Ambulatory Visit: Payer: Self-pay | Admitting: Urology

## 2018-02-13 ENCOUNTER — Ambulatory Visit (INDEPENDENT_AMBULATORY_CARE_PROVIDER_SITE_OTHER): Payer: BLUE CROSS/BLUE SHIELD | Admitting: Urology

## 2018-02-13 ENCOUNTER — Encounter: Payer: Self-pay | Admitting: Urology

## 2018-02-13 VITALS — BP 191/73 | HR 89 | Ht 59.0 in | Wt 162.6 lb

## 2018-02-13 DIAGNOSIS — R972 Elevated prostate specific antigen [PSA]: Secondary | ICD-10-CM | POA: Diagnosis not present

## 2018-02-13 MED ORDER — LEVOFLOXACIN 500 MG PO TABS
500.0000 mg | ORAL_TABLET | Freq: Once | ORAL | Status: AC
  Administered 2018-02-13: 500 mg via ORAL

## 2018-02-13 MED ORDER — GENTAMICIN SULFATE 40 MG/ML IJ SOLN
80.0000 mg | Freq: Once | INTRAMUSCULAR | Status: AC
  Administered 2018-02-13: 80 mg via INTRAMUSCULAR

## 2018-02-13 NOTE — Telephone Encounter (Signed)
Unable to reach, will discuss in clinic

## 2018-02-13 NOTE — Progress Notes (Signed)
   02/13/2018   CC:  Chief Complaint  Patient presents with  . Biopsy    HPI: Daniel Thomas is a 63 y.o. male with ESRD on dialysis, a recent hospitalization for congestive heart failure, and elevated PSA, presenting today for a prostate biopsy.  When he originally presented, his PSA was around 8 but has since risen up to 19.56.  Patient's last rectal exam found a massively enlarge prostate, 55+ cc with rubbery nodule at apex.   Blood pressure (!) 191/73, pulse 89, height 4\' 11"  (1.499 m), weight 162 lb 9.6 oz (73.8 kg). NED. A&Ox3.   No respiratory distress   Abd soft, NT, ND Normal sphincter tone  Prostate Biopsy Procedure   Informed consent was obtained after discussing risks/benefits of the procedure.  A time out was performed to ensure correct patient identity.  Pre-Procedure: - Last PSA Level: 19.56 on 01/08/2018 - Gentamicin given prophylactically - Levaquin 500 mg administered PO - Transrectal Ultrasound performed revealing a 66.06 mL prostate, nodule on left mid apical area and at right base, large, heterogenous. - No significant hypoechoic or median lobe noted  Procedure: - Prostate block performed using 10 cc 1% lidocaine and biopsies taken from sextant areas, a total of 12 under ultrasound guidance.  Post-Procedure: - Patient tolerated the procedure well - He was counseled to seek immediate medical attention if experiences any severe pain, significant bleeding, or fevers - Return in one week to discuss biopsy results   I, Adele Schilder, am acting as a scribe for Hollice Espy, MD.    I have reviewed the above documentation for accuracy and completeness, and I agree with the above.   Hollice Espy, MD

## 2018-02-13 NOTE — Patient Instructions (Signed)

## 2018-02-16 ENCOUNTER — Other Ambulatory Visit: Payer: Self-pay | Admitting: Urology

## 2018-02-16 LAB — PATHOLOGY REPORT

## 2018-02-23 NOTE — Progress Notes (Signed)
02/27/2018  9:23 AM   [name] 25-Mar-1955 [id number]  Referring provider: Marguerita Merles, [place], [place] [address]  Chief Complaint  Patient presents with  . Follow-up   HPI: Daniel Thomas is a 63 y.o. Black or [place] American male that presents today for evaluation and management following prostate bx results.  He is accompanied today by his brother.  He has multiple comorbidities including HTN, ESRD on dialysis, congestive heart failure (with recent hospitalization - 01/12/2018), poorly controlled diabetes (HGBA1C 8.5 - 01/2018), Stage IV CKD and elevated PSA.  Prostate Biopsy on 02/13/2018 with 66.06 ml prostate, nodule on left midapical area and at right base, large heterogeneous. Biopsy results of Gleason Score 6 (3+3) noted in 6/12 cores. Malignant cores noted at left apex and midportion.   Patient has paternal familial history of prostate issues that sound like BPH.  Patient is concerned about his decreasing urine output, feels like he is not urinating enough. He is chronically on dialysis.  He does not feel like he is having incomplete bladder emptying.  Patient reports that he is currently not sexually active.  PSA Trend 19.56 ng/ml on 01/08/2018 10.00 ng/ml on 09/22/2017  IPSS    Row Name 02/27/18 1600         International Prostate Symptom Score   How often have you had the sensation of not emptying your bladder?  Not at All     How often have you had to urinate less than every two hours?  Less than 1 in 5 times     How often have you found you stopped and started again several times when you urinated?  Not at All     How often have you found it difficult to postpone urination?  Not at All     How often have you had a weak urinary stream?  Less than 1 in 5 times     How often have you had to strain to start urination?  Not at All     How many times did you typically get up at night to urinate?  1 Time     Total IPSS Score  3       Quality  of Life due to urinary symptoms   If you were to spend the rest of your life with your urinary condition just the way it is now how would you feel about that?  Unhappy        Score:  1-7 Mild 8-19 Moderate 20-35 Severe  SHIM    Row Name 02/27/18 1601         SHIM: Over the last 6 months:   How do you rate your confidence that you could get and keep an erection?  Very Low     When you had erections with sexual stimulation, how often were your erections hard enough for penetration (entering your partner)?  Most Times (much more than half the time)     During sexual intercourse, how often were you able to maintain your erection after you had penetrated (entered) your partner?  Most Times (much more than half the time)     During sexual intercourse, how difficult was it to maintain your erection to completion of intercourse?  Slightly Difficult     When you attempted sexual intercourse, how often was it satisfactory for you?  Most Times (much more than half the time)       SHIM Total Score   SHIM  17        Score: 1-7 Severe ED 8-11 Moderate ED 12-16 Mild-Moderate ED 17-21 Mild ED 22-25 No ED   PMH: Past Medical History:  Diagnosis Date  . COPD (chronic obstructive pulmonary disease) ([place])   . Diabetes mellitus without complication ([place])   . Enlarged prostate   . Heart murmur   . High cholesterol   . Hypertension     Surgical History: Past Surgical History:  Procedure Laterality Date  . DIALYSIS/PERMA CATHETER INSERTION N/A 01/12/2018   Procedure: DIALYSIS/PERMA CATHETER INSERTION;  Surgeon: Katha Cabal, MD;  Location: [facility];  Service: Cardiovascular;  Laterality: N/A;    Home Medications:  Allergies as of 02/27/2018   No Known Allergies     Medication List       Accurate as of February 27, 2018 11:59 PM. Always use your most recent med list.        amLODipine 10 MG tablet Commonly known as:  NORVASC Take 1 tablet (10 mg total) by  mouth daily.   citalopram 20 MG tablet Commonly known as:  CELEXA Take 20 mg by mouth daily.   hydrALAZINE 25 MG tablet Commonly known as:  APRESOLINE Take 1 tablet (25 mg total) by mouth every 8 (eight) hours.   linagliptin 5 MG Tabs tablet Commonly known as:  TRADJENTA Take 1 tablet (5 mg total) by mouth daily.   lovastatin 40 MG tablet Commonly known as:  MEVACOR 40 mg nightly.   metoprolol tartrate 50 MG tablet Commonly known as:  LOPRESSOR Take 1 tablet (50 mg total) by mouth 2 (two) times daily.   torsemide 20 MG tablet Commonly known as:  DEMADEX 2 tablets po daily   Vitamin D (Ergocalciferol) 1.25 MG (50000 UT) Caps capsule Commonly known as:  DRISDOL Take 50,000 Units by mouth once a week.       Allergies: No Known Allergies  Family History: History reviewed. No pertinent family history.  Social History:  reports that he has quit smoking. He quit after 20.00 years of use. He has never used smokeless tobacco. He reports that he does not drink alcohol or use drugs.  ROS: UROLOGY Frequent Urination?: No Hard to postpone urination?: No Burning/pain with urination?: No Get up at night to urinate?: Yes Leakage of urine?: No Urine stream starts and stops?: No Trouble starting stream?: Yes Do you have to strain to urinate?: No Blood in urine?: No Urinary tract infection?: No Sexually transmitted disease?: No Injury to kidneys or bladder?: No Painful intercourse?: No Weak stream?: Yes Erection problems?: No Penile pain?: No  Gastrointestinal Nausea?: No Vomiting?: No Indigestion/heartburn?: No Diarrhea?: Yes Constipation?: No  Constitutional Fever: No Night sweats?: No Weight loss?: Yes Fatigue?: No  Skin Skin rash/lesions?: Yes Itching?: No  Eyes Blurred vision?: No Double vision?: No  Ears/Nose/Throat Sore throat?: No Sinus problems?: No  Hematologic/Lymphatic Swollen glands?: No Easy bruising?: No  Cardiovascular Leg  swelling?: No Chest pain?: No  Respiratory Cough?: Yes Shortness of breath?: No  Endocrine Excessive thirst?: No  Musculoskeletal Back pain?: No Joint pain?: No  Neurological Headaches?: No Dizziness?: No  Psychologic Depression?: No Anxiety?: No  Physical Exam: BP (!) 199/76 (BP Location: Left Arm, Patient Position: Sitting, Cuff Size: Normal)   Pulse 96   Ht 4\' 11"  (1.499 m)   Wt 163 lb (73.9 kg)   BMI 32.92 kg/m   Constitutional:  Alert and oriented, No acute distress. Accompanied by his brother. Respiratory: Normal respiratory effort, no  increased work of breathing. Head: Normocephalic and Atraumatic. GU: No CVA tenderness Skin: No rashes, bruises or suspicious lesions. Neurologic: Grossly intact, no focal deficits, moving all 4 extremities. Psychiatric: Normal mood and affect.  Laboratory Data: Lab Results  Component Value Date   WBC 14.1 (H) 01/13/2018   HGB 9.1 (L) 01/13/2018   HCT 28.2 (L) 01/13/2018   MCV 87.3 01/13/2018   PLT 385 01/13/2018   Lab Results  Component Value Date   CREATININE 3.12 (H) 01/16/2018   Lab Results  Component Value Date   HGBA1C 8.5 (H) 01/07/2018   PSA Trend 19.56 ng/ml on 01/08/2018 10.00 ng/ml on 09/22/2017  Assessment & Plan:    1. Prostate Cancer  - Low grade, Gleason Score 6 (3+3), albeit PSA is relatively elevated  - The patient was counseled about the natural history of prostate cancer and the standard treatment options that are available for prostate cancer. It was explained to him how his age and life expectancy, clinical stage, Gleason score, and PSA affect his prognosis, the decision to proceed with additional staging studies, as well as how that information influences recommended treatment strategies.   - We discussed the roles for active surveillance, radiation therapy, surgical therapy, androgen deprivation, as well as ablative therapy options for the treatment of prostate cancer as appropriate to his  individual cancer situation. We discussed the risks and benefits of these options with regard to their impact on cancer control and also in terms of potential adverse events, complications, and impact on quality of life particularly related to urinary, bowel, and sexual function.   - The patient was encouraged to ask questions throughout the discussion today and all questions were answered to his stated satisfaction. In addition, the patient was providedwith and/or directed to appropriate resources and literature for further education about prostate cancer treatment options.            -Given the patient's significant comorbidities, I recommend surveillance at this time with low threshold to transition to intervention as deemed necessary, will repeat PSA in 3 months and if it remains markedly elevated, will pursue further imaging -If his PSA is trending back down in 3 months, we will see him in 6 months with a repeat PSA and continue surveillance  2. [place] Discussed that his low urinary output is likely secondary to initiation of hemodialysis, reassured    Return in about 6 months (around 08/28/2018) for PSA prior.  [facility] [address], [place], [place] [address] [phone]  I, Stephania Fragmin , am acting as a scribe for Hollice Espy, MD  I have reviewed the above documentation for accuracy and completeness, and I agree with the above.   Hollice Espy, MD

## 2018-02-27 ENCOUNTER — Ambulatory Visit (INDEPENDENT_AMBULATORY_CARE_PROVIDER_SITE_OTHER): Payer: BLUE CROSS/BLUE SHIELD | Admitting: Urology

## 2018-02-27 ENCOUNTER — Encounter: Payer: Self-pay | Admitting: Urology

## 2018-02-27 VITALS — BP 199/76 | HR 96 | Ht 59.0 in | Wt 163.0 lb

## 2018-02-27 DIAGNOSIS — R34 Anuria and oliguria: Secondary | ICD-10-CM

## 2018-02-27 DIAGNOSIS — C61 Malignant neoplasm of prostate: Secondary | ICD-10-CM

## 2018-03-01 ENCOUNTER — Telehealth: Payer: Self-pay | Admitting: Urology

## 2018-03-01 DIAGNOSIS — C61 Malignant neoplasm of prostate: Secondary | ICD-10-CM

## 2018-03-01 NOTE — Telephone Encounter (Signed)
I think there is a little bit of confusion at checkout, I would actually like him to come back in 3 months for lab only PSA and see me in 6 months with a PSA.  If his PSA in 3 months remains significantly elevated or continues to rise, he may need a CT scan.  Please let the patient know and schedule this lab only visit.  Hollice Espy, MD

## 2018-03-05 NOTE — Telephone Encounter (Signed)
Called and advised patient. Lab appt made for May 18 for PSA.

## 2018-03-19 ENCOUNTER — Ambulatory Visit: Payer: BLUE CROSS/BLUE SHIELD | Admitting: Podiatry

## 2018-04-17 ENCOUNTER — Emergency Department: Payer: BLUE CROSS/BLUE SHIELD

## 2018-04-17 ENCOUNTER — Inpatient Hospital Stay
Admission: EM | Admit: 2018-04-17 | Discharge: 2018-04-18 | DRG: 314 | Disposition: A | Discharge status: Home / Self Care | Payer: BLUE CROSS/BLUE SHIELD | Attending: Internal Medicine | Admitting: Internal Medicine

## 2018-04-17 ENCOUNTER — Other Ambulatory Visit: Payer: Self-pay

## 2018-04-17 DIAGNOSIS — Z87891 Personal history of nicotine dependence: Secondary | ICD-10-CM

## 2018-04-17 DIAGNOSIS — J449 Chronic obstructive pulmonary disease, unspecified: Secondary | ICD-10-CM | POA: Diagnosis present

## 2018-04-17 DIAGNOSIS — T82510A Breakdown (mechanical) of surgically created arteriovenous fistula, initial encounter: Secondary | ICD-10-CM | POA: Diagnosis present

## 2018-04-17 DIAGNOSIS — E785 Hyperlipidemia, unspecified: Secondary | ICD-10-CM | POA: Diagnosis present

## 2018-04-17 DIAGNOSIS — N400 Enlarged prostate without lower urinary tract symptoms: Secondary | ICD-10-CM | POA: Diagnosis present

## 2018-04-17 DIAGNOSIS — E8770 Fluid overload, unspecified: Secondary | ICD-10-CM | POA: Diagnosis present

## 2018-04-17 DIAGNOSIS — F329 Major depressive disorder, single episode, unspecified: Secondary | ICD-10-CM | POA: Diagnosis present

## 2018-04-17 DIAGNOSIS — Z79899 Other long term (current) drug therapy: Secondary | ICD-10-CM | POA: Diagnosis not present

## 2018-04-17 DIAGNOSIS — N186 End stage renal disease: Secondary | ICD-10-CM | POA: Diagnosis present

## 2018-04-17 DIAGNOSIS — Z8546 Personal history of malignant neoplasm of prostate: Secondary | ICD-10-CM | POA: Diagnosis not present

## 2018-04-17 DIAGNOSIS — R0602 Shortness of breath: Secondary | ICD-10-CM | POA: Diagnosis present

## 2018-04-17 DIAGNOSIS — E7800 Pure hypercholesterolemia, unspecified: Secondary | ICD-10-CM | POA: Diagnosis present

## 2018-04-17 DIAGNOSIS — E1122 Type 2 diabetes mellitus with diabetic chronic kidney disease: Secondary | ICD-10-CM | POA: Diagnosis present

## 2018-04-17 DIAGNOSIS — Y712 Prosthetic and other implants, materials and accessory cardiovascular devices associated with adverse incidents: Secondary | ICD-10-CM | POA: Diagnosis present

## 2018-04-17 DIAGNOSIS — Z992 Dependence on renal dialysis: Secondary | ICD-10-CM | POA: Diagnosis not present

## 2018-04-17 DIAGNOSIS — T82590A Other mechanical complication of surgically created arteriovenous fistula, initial encounter: Secondary | ICD-10-CM | POA: Diagnosis present

## 2018-04-17 HISTORY — DX: Dependence on renal dialysis: Z99.2

## 2018-04-17 HISTORY — DX: Dependence on renal dialysis: N18.6

## 2018-04-17 HISTORY — DX: End stage renal disease: N18.6

## 2018-04-17 LAB — CBC WITH DIFFERENTIAL/PLATELET
Abs Immature Granulocytes: 0.06 10*3/uL (ref 0.00–0.07)
Basophils Absolute: 0.1 10*3/uL (ref 0.0–0.1)
Basophils Relative: 1 %
Eosinophils Absolute: 0.4 10*3/uL (ref 0.0–0.5)
Eosinophils Relative: 3 %
HCT: 40.3 % (ref 39.0–52.0)
Hemoglobin: 12.9 g/dL — ABNORMAL LOW (ref 13.0–17.0)
Immature Granulocytes: 1 %
Lymphocytes Relative: 17 %
Lymphs Abs: 2.2 10*3/uL (ref 0.7–4.0)
MCH: 27.3 pg (ref 26.0–34.0)
MCHC: 32 g/dL (ref 30.0–36.0)
MCV: 85.4 fL (ref 80.0–100.0)
Monocytes Absolute: 1 10*3/uL (ref 0.1–1.0)
Monocytes Relative: 8 %
Neutro Abs: 8.9 10*3/uL — ABNORMAL HIGH (ref 1.7–7.7)
Neutrophils Relative %: 70 %
Platelets: 376 10*3/uL (ref 150–400)
RBC: 4.72 MIL/uL (ref 4.22–5.81)
RDW: 14.6 % (ref 11.5–15.5)
WBC: 12.5 10*3/uL — ABNORMAL HIGH (ref 4.0–10.5)
nRBC: 0 % (ref 0.0–0.2)

## 2018-04-17 LAB — COMPREHENSIVE METABOLIC PANEL
ALT: 28 U/L (ref 0–44)
AST: 23 U/L (ref 15–41)
Albumin: 4.1 g/dL (ref 3.5–5.0)
Alkaline Phosphatase: 154 U/L — ABNORMAL HIGH (ref 38–126)
Anion gap: 12 (ref 5–15)
BUN: 63 mg/dL — ABNORMAL HIGH (ref 8–23)
CO2: 20 mmol/L — ABNORMAL LOW (ref 22–32)
Calcium: 8.6 mg/dL — ABNORMAL LOW (ref 8.9–10.3)
Chloride: 102 mmol/L (ref 98–111)
Creatinine, Ser: 5.7 mg/dL — ABNORMAL HIGH (ref 0.61–1.24)
GFR calc Af Amer: 11 mL/min — ABNORMAL LOW (ref >60)
GFR calc non Af Amer: 10 mL/min — ABNORMAL LOW (ref >60)
Glucose, Bld: 178 mg/dL — ABNORMAL HIGH (ref 70–99)
Potassium: 5.4 mmol/L — ABNORMAL HIGH (ref 3.5–5.1)
Sodium: 134 mmol/L — ABNORMAL LOW (ref 135–145)
Total Bilirubin: 0.5 mg/dL (ref 0.3–1.2)
Total Protein: 8.3 g/dL — ABNORMAL HIGH (ref 6.5–8.1)

## 2018-04-17 LAB — TROPONIN I: Troponin I: <0.03 ng/mL (ref <0.03)

## 2018-04-17 LAB — GLUCOSE, CAPILLARY
Glucose-Capillary: 95 mg/dL (ref 70–99)
Glucose-Capillary: 204 mg/dL — ABNORMAL HIGH (ref 70–99)

## 2018-04-17 LAB — BRAIN NATRIURETIC PEPTIDE: B Natriuretic Peptide: 753 pg/mL — ABNORMAL HIGH (ref 0.0–100.0)

## 2018-04-17 LAB — MRSA PCR SCREENING: MRSA by PCR: NEGATIVE

## 2018-04-17 MED ORDER — SODIUM CHLORIDE 0.9% FLUSH
3.0000 mL | Freq: Two times a day (BID) | INTRAVENOUS | Status: DC
  Administered 2018-04-17 (×2): 3 mL via INTRAVENOUS

## 2018-04-17 MED ORDER — SENNOSIDES-DOCUSATE SODIUM 8.6-50 MG PO TABS: 1.0000 | ORAL_TABLET | Freq: Every evening | ORAL | Status: DC | PRN

## 2018-04-17 MED ORDER — HEPARIN SODIUM (PORCINE) 5000 UNIT/ML IJ SOLN
5000.0000 [IU] | Freq: Three times a day (TID) | INTRAMUSCULAR | Status: DC
  Administered 2018-04-17 – 2018-04-18 (×2): 5000 [IU] via SUBCUTANEOUS
  Filled 2018-04-17 (×2): qty 1

## 2018-04-17 MED ORDER — INSULIN ASPART 100 UNIT/ML ~~LOC~~ SOLN
0.0000 [IU] | Freq: Every day | SUBCUTANEOUS | Status: DC
  Administered 2018-04-17: 2 [IU] via SUBCUTANEOUS
  Filled 2018-04-17: qty 0.05
  Filled 2018-04-17: qty 1

## 2018-04-17 MED ORDER — HYDRALAZINE HCL 25 MG PO TABS
25.0000 mg | ORAL_TABLET | Freq: Three times a day (TID) | ORAL | Status: DC
  Administered 2018-04-17 – 2018-04-18 (×2): 25 mg via ORAL
  Filled 2018-04-17 (×2): qty 1

## 2018-04-17 MED ORDER — SODIUM CHLORIDE 0.9 % IV SOLN: 250.0000 mL | INTRAVENOUS | Status: DC | PRN

## 2018-04-17 MED ORDER — HEPARIN SODIUM (PORCINE) 10000 UNIT/ML IJ SOLN
INTRAMUSCULAR | Status: AC
  Filled 2018-04-17: qty 2

## 2018-04-17 MED ORDER — ONDANSETRON HCL 4 MG PO TABS: 4.0000 mg | ORAL_TABLET | Freq: Four times a day (QID) | ORAL | Status: DC | PRN

## 2018-04-17 MED ORDER — ONDANSETRON HCL 4 MG/2ML IJ SOLN: 4.0000 mg | Freq: Four times a day (QID) | INTRAMUSCULAR | Status: DC | PRN

## 2018-04-17 MED ORDER — PENTAFLUOROPROP-TETRAFLUOROETH EX AERO: 1.0000 "application " | INHALATION_SPRAY | CUTANEOUS | Status: DC | PRN

## 2018-04-17 MED ORDER — SODIUM CHLORIDE 0.9% FLUSH: 3.0000 mL | INTRAVENOUS | Status: DC | PRN

## 2018-04-17 MED ORDER — NITROGLYCERIN 2 % TD OINT
1.0000 [in_us] | TOPICAL_OINTMENT | Freq: Once | TRANSDERMAL | Status: AC
  Administered 2018-04-17: 1 [in_us] via TOPICAL
  Filled 2018-04-17: qty 1

## 2018-04-17 MED ORDER — SODIUM CHLORIDE FLUSH 0.9 % IV SOLN
INTRAVENOUS | Status: AC
  Filled 2018-04-17: qty 60

## 2018-04-17 MED ORDER — VITAMIN D (ERGOCALCIFEROL) 1.25 MG (50000 UNIT) PO CAPS
50000.0000 [IU] | ORAL_CAPSULE | ORAL | Status: DC
  Administered 2018-04-18: 50000 [IU] via ORAL
  Filled 2018-04-17: qty 1

## 2018-04-17 MED ORDER — LIDOCAINE-PRILOCAINE 2.5-2.5 % EX CREA: 1.0000 "application " | TOPICAL_CREAM | CUTANEOUS | Status: DC | PRN

## 2018-04-17 MED ORDER — SODIUM CHLORIDE 0.9 % IV SOLN: 100.0000 mL | INTRAVENOUS | Status: DC | PRN

## 2018-04-17 MED ORDER — TORSEMIDE 20 MG PO TABS
20.0000 mg | ORAL_TABLET | Freq: Every day | ORAL | Status: DC
  Administered 2018-04-17 – 2018-04-18 (×2): 20 mg via ORAL
  Filled 2018-04-17 (×2): qty 1

## 2018-04-17 MED ORDER — HEPARIN SODIUM (PORCINE) 1000 UNIT/ML DIALYSIS: 1000.0000 [IU] | INTRAMUSCULAR | Status: DC | PRN

## 2018-04-17 MED ORDER — CITALOPRAM HYDROBROMIDE 20 MG PO TABS
20.0000 mg | ORAL_TABLET | Freq: Every day | ORAL | Status: DC
  Administered 2018-04-17 – 2018-04-18 (×2): 20 mg via ORAL
  Filled 2018-04-17 (×2): qty 1

## 2018-04-17 MED ORDER — ACETAMINOPHEN 325 MG PO TABS: 650.0000 mg | ORAL_TABLET | Freq: Four times a day (QID) | ORAL | Status: DC | PRN

## 2018-04-17 MED ORDER — ACETAMINOPHEN 650 MG RE SUPP: 650.0000 mg | Freq: Four times a day (QID) | RECTAL | Status: DC | PRN

## 2018-04-17 MED ORDER — AMLODIPINE BESYLATE 10 MG PO TABS
10.0000 mg | ORAL_TABLET | Freq: Every day | ORAL | Status: DC
  Administered 2018-04-17 – 2018-04-18 (×2): 10 mg via ORAL
  Filled 2018-04-17 (×2): qty 1

## 2018-04-17 MED ORDER — CHLORHEXIDINE GLUCONATE CLOTH 2 % EX PADS
6.0000 | MEDICATED_PAD | Freq: Every day | CUTANEOUS | Status: DC
  Administered 2018-04-18: 6 via TOPICAL

## 2018-04-17 MED ORDER — METOPROLOL TARTRATE 50 MG PO TABS
50.0000 mg | ORAL_TABLET | Freq: Two times a day (BID) | ORAL | Status: DC
  Administered 2018-04-17 – 2018-04-18 (×2): 50 mg via ORAL
  Filled 2018-04-17 (×2): qty 1

## 2018-04-17 MED ORDER — INSULIN ASPART 100 UNIT/ML ~~LOC~~ SOLN
0.0000 [IU] | Freq: Three times a day (TID) | SUBCUTANEOUS | Status: DC
  Administered 2018-04-18: 1 [IU] via SUBCUTANEOUS
  Filled 2018-04-17: qty 0.09
  Filled 2018-04-17: qty 1

## 2018-04-17 MED ORDER — LIDOCAINE HCL (PF) 1 % IJ SOLN: 5.0000 mL | INTRAMUSCULAR | Status: DC | PRN

## 2018-04-17 MED ORDER — ALTEPLASE 2 MG IJ SOLR: 2.0000 mg | Freq: Once | INTRAMUSCULAR | Status: DC | PRN

## 2018-04-17 MED ORDER — PRAVASTATIN SODIUM 20 MG PO TABS
20.0000 mg | ORAL_TABLET | Freq: Every day | ORAL | Status: DC
  Administered 2018-04-17: 20 mg via ORAL
  Filled 2018-04-17: qty 1

## 2018-04-17 NOTE — Progress Notes (Signed)
Advanced care plan. Purpose of the Encounter: CODE STATUS Parties in Attendance:Patient Patient's Decision Capacity:Good Subjective/Patient's story:  Daniel Thomas  is a 63 y.o. male with a known history of end-stage renal disease on dialysis, type 2 diabetes mellitus, COPD, hypertension, hyperlipidemia presented to the emergency room for shortness of breath.  Patient said he was short winded and O2 sats were around 82% when EMS picked up.  Objective/Medical story Patient needs declotting of access catheter. Needs vascular surgery consult and nephrology consult  Goals of care determination:  Advance care directives and goals of care discussed Patient wants everything done which includes cpr, intubation and ventilator if the need arises. CODE STATUS: Full code Time spent discussing advanced care planning: 16 minutes

## 2018-04-17 NOTE — H&P (Signed)
[facility] [place] [facility] [place] NAME: Daniel Thomas    MR#:  [id number]  DATE OF BIRTH:  [birth year]  DATE OF ADMISSION:  04/17/2018  PRIMARY CARE PHYSICIAN: Marguerita Merles, MD   REQUESTING/REFERRING PHYSICIAN:   CHIEF COMPLAINT:   Chief Complaint  Patient presents with  . Shortness of Breath    HISTORY OF PRESENT ILLNESS: Daniel Thomas  is a 63 y.o. male with a known history of end-stage renal disease on dialysis, type 2 diabetes mellitus, COPD, hypertension, hyperlipidemia presented to the emergency room for shortness of breath.  Patient said he was short winded and O2 sats were around 82% when EMS picked up.  And was put on oxygen via nasal cannula.  He gets dialysis on Tuesday Thursday and Saturday.  Patient had dialysis on Saturday.  Was evaluated in the emergency room and ER physician noticed clotting of dialysis access catheter.  Vascular surgery has been informed for declotting.  No recent travel.  No cough.  No fever and sore throat.  No international visitors or friends.  PAST MEDICAL HISTORY:   Past Medical History:  Diagnosis Date  . COPD (chronic obstructive pulmonary disease) ([place])   . Diabetes mellitus without complication ([place])   . Enlarged prostate   . ESRD on dialysis ([place])   . Heart murmur   . High cholesterol   . Hypertension   Prostate cancer  PAST SURGICAL HISTORY:  Past Surgical History:  Procedure Laterality Date  . DIALYSIS/PERMA CATHETER INSERTION N/A 01/12/2018   Procedure: DIALYSIS/PERMA CATHETER INSERTION;  Surgeon: Katha Cabal, MD;  Location: [facility];  Service: Cardiovascular;  Laterality: N/A;    SOCIAL HISTORY:  Social History   Tobacco Use  . Smoking status: Former Smoker    Years: 20.00  . Smokeless tobacco: Never Used  Substance Use Topics  . Alcohol use: Never    Frequency: Never    FAMILY HISTORY: Mother and father deceased  DRUG ALLERGIES: No Known Allergies  REVIEW OF  SYSTEMS:   CONSTITUTIONAL: No fever, fatigue or weakness.  EYES: No blurred or double vision.  EARS, NOSE, AND THROAT: No tinnitus or ear pain.  RESPIRATORY: No cough, has shortness of breath, no wheezing or hemoptysis.  CARDIOVASCULAR: No chest pain, orthopnea, edema.  GASTROINTESTINAL: No nausea, vomiting, diarrhea or abdominal pain.  GENITOURINARY: No dysuria, hematuria.  ENDOCRINE: No polyuria, nocturia,  HEMATOLOGY: No anemia, easy bruising or bleeding SKIN: No rash or lesion. MUSCULOSKELETAL: No joint pain or arthritis.   NEUROLOGIC: No tingling, numbness, weakness.  PSYCHIATRY: No anxiety or depression.   MEDICATIONS AT HOME:  Prior to Admission medications   Medication Sig Start Date End Date Taking? Authorizing Provider  amLODipine (NORVASC) 10 MG tablet Take 1 tablet (10 mg total) by mouth daily. 01/08/18   Loletha Grayer, MD  carvedilol (COREG) 12.5 MG tablet Take 12.5 mg by mouth 2 (two) times daily with a meal. 03/31/18   [provider]  citalopram (CELEXA) 20 MG tablet Take 20 mg by mouth daily.    [provider]  hydrALAZINE (APRESOLINE) 25 MG tablet Take 1 tablet (25 mg total) by mouth every 8 (eight) hours. 01/08/18   Loletha Grayer, MD  linagliptin (TRADJENTA) 5 MG TABS tablet Take 1 tablet (5 mg total) by mouth daily. 01/09/18   Loletha Grayer, MD  lovastatin (MEVACOR) 40 MG tablet 40 mg nightly. 07/11/08   [provider]  metoprolol tartrate (LOPRESSOR) 50 MG tablet Take 1  tablet (50 mg total) by mouth 2 (two) times daily. 01/08/18   Loletha Grayer, MD  torsemide (DEMADEX) 20 MG tablet 2 tablets po daily 01/08/18   Loletha Grayer, MD  traZODone (DESYREL) 50 MG tablet Take 25 mg by mouth at bedtime as needed for sleep. 03/18/18   [provider]  Vitamin D, Ergocalciferol, (DRISDOL) 1.25 MG (50000 UT) CAPS capsule Take 50,000 Units by mouth once a week. 10/24/17 10/24/18  [provider]      PHYSICAL EXAMINATION:    VITAL SIGNS: Blood pressure (!) 176/81, pulse 87, temperature 98.6 F (37 C), temperature source Oral, resp. rate (!) 26, height 4\' 11"  (1.499 m), weight 69.4 kg, SpO2 (!) 82 %.  GENERAL:  63 y.o.-year-old patient lying in the bed with no acute distress.  EYES: Pupils equal, round, reactive to light and accommodation. No scleral icterus. Extraocular muscles intact.  HEENT: Head atraumatic, normocephalic. Oropharynx and nasopharynx clear.  NECK:  Supple, no jugular venous distention. No thyroid enlargement, no tenderness.  LUNGS: Decreased breath sounds bilaterally, scattered rales heard. No use of accessory muscles of respiration.  CARDIOVASCULAR: S1, S2 normal. No murmurs, rubs, or gallops.  ABDOMEN: Soft, nontender, nondistended. Bowel sounds present. No organomegaly or mass.  EXTREMITIES: No pedal edema, cyanosis, or clubbing.  NEUROLOGIC: Cranial nerves II through XII are intact. Muscle strength 5/5 in all extremities. Sensation intact. Gait not checked.  PSYCHIATRIC: The patient is alert and oriented x 3.  SKIN: No obvious rash, lesion, or ulcer.   LABORATORY PANEL:   CBC Recent Labs  Lab 04/17/18 1228  WBC 12.5*  HGB 12.9*  HCT 40.3  PLT 376  MCV 85.4  MCH 27.3  MCHC 32.0  RDW 14.6  LYMPHSABS 2.2  MONOABS 1.0  EOSABS 0.4  BASOSABS 0.1   ------------------------------------------------------------------------------------------------------------------  Chemistries  Recent Labs  Lab 04/17/18 1228  NA 134*  K 5.4*  CL 102  CO2 20*  GLUCOSE 178*  BUN 63*  CREATININE 5.70*  CALCIUM 8.6*  AST 23  ALT 28  ALKPHOS 154*  BILITOT 0.5   ------------------------------------------------------------------------------------------------------------------ estimated creatinine clearance is 10.7 mL/min (A) (by C-G formula based on SCr of 5.7 mg/dL (H)). ------------------------------------------------------------------------------------------------------------------ No  results for input(s): TSH, T4TOTAL, T3FREE, THYROIDAB in the last 72 hours.  Invalid input(s): FREET3   Coagulation profile No results for input(s): INR, PROTIME in the last 168 hours. ------------------------------------------------------------------------------------------------------------------- No results for input(s): DDIMER in the last 72 hours. -------------------------------------------------------------------------------------------------------------------  Cardiac Enzymes Recent Labs  Lab 04/17/18 1228  TROPONINI <0.03   ------------------------------------------------------------------------------------------------------------------ Invalid input(s): POCBNP  ---------------------------------------------------------------------------------------------------------------  Urinalysis    Component Value Date/Time   COLORURINE COLORLESS (A) 01/07/2018 1230   APPEARANCEUR CLEAR (A) 01/07/2018 1230   LABSPEC 1.006 01/07/2018 1230   PHURINE 5.0 01/07/2018 1230   GLUCOSEU 50 (A) 01/07/2018 1230   HGBUR SMALL (A) 01/07/2018 1230   BILIRUBINUR NEGATIVE 01/07/2018 1230   KETONESUR NEGATIVE 01/07/2018 1230   PROTEINUR 100 (A) 01/07/2018 1230   NITRITE NEGATIVE 01/07/2018 1230   LEUKOCYTESUR NEGATIVE 01/07/2018 1230     RADIOLOGY: Dg Chest Portable 1 View  Result Date: 04/17/2018 CLINICAL DATA:  Shortness of breath.  Renal failure EXAM: PORTABLE CHEST 1 VIEW COMPARISON:  January 12, 2018. FINDINGS: Central catheter tip at cavoatrial junction. No pneumothorax. There is no appreciable edema or consolidation. Heart is upper normal in size with pulmonary vascularity normal. No adenopathy. No bone lesions. IMPRESSION: Central catheter as described without pneumothorax. No edema or consolidation. Heart upper normal in size.  Electronically Signed   By: Lowella Grip III M.D.   On: 04/17/2018 12:58    EKG: Orders placed or performed during the hospital encounter of 04/17/18  .  EKG 12-Lead  . EKG 12-Lead    IMPRESSION AND PLAN: 63 year old male patient with a known history of end-stage renal disease on dialysis, type 2 diabetes mellitus, COPD, hypertension, hyperlipidemia presented to the emergency room for shortness of breath.   -Dyspnea secondary to fluid overload Dialysis after declotting of access catheter Vascular surgery and nephrology consult Oxygen as needed via nasal cannula  -ESRD Nephrology consult for dialysis  -Type 2 diabetes mellitus Diabetic diet with sliding scale coverage with insulin  -DVT prophylaxis subcu heparin  -Hypertension Continue Norvasc and beta-blocker  All the records are reviewed and case discussed with ED provider. Management plans discussed with the patient, family and they are in agreement.  CODE STATUS:Full code  Code Status History    Date Active Date Inactive Code Status Order ID Comments User Context   01/12/2018 1658 01/16/2018 2207 Full Code [id number]  Bettey Costa, MD Inpatient   01/06/2018 2228 01/08/2018 1455 Full Code [id number]  Salary, Avel Peace, MD ED      TOTAL TIME TAKING CARE OF THIS PATIENT: 53 minutes.    Saundra Shelling M.D on 04/17/2018 at 2:05 PM  Between 7am to 6pm - Pager - [phone]  After 6pm go to [email or web address] - password [email or web address] [facility]  [facility]  Office  [phone]  CC: Primary care physician; Marguerita Merles, MD

## 2018-04-17 NOTE — Progress Notes (Signed)
HD Tx End  Tx ended early per pt, d/t pt's complaint of needing to have BM/"cannot use bedpan". Voiced c/o frequent "diarrhea every time I take all of my medicine". Unable to narrow down correlation with specific med.   No SOB, O2 sat 99% on room air post tx. Removed 1.2 liters, tolerated well aside from the GI upset at the 2 hour mark. 1.5 hours remaining in tx when pt signed off.    04/17/18 1715  Hand-Off documentation  Report given to (Full Name) [facility] RN  Report received from (Full Name) Trellis Paganini RN  Vital Signs  Temp 98 F (36.7 C)  Temp Source Oral  Pulse Rate 73  Pulse Rate Source Monitor  Resp (!) 34  BP 103/67  BP Location Right Arm  BP Method Automatic  Patient Position (if appropriate) Lying  Oxygen Therapy  SpO2 99 %  O2 Device Room Air  Pain Assessment  Pain Scale 0-10  Pain Score 0  Dialysis Weight  Weight 68.2 kg  Type of Weight Post-Dialysis  During Hemodialysis Assessment  Blood Flow Rate (mL/min) 400 mL/min  Arterial Pressure (mmHg) -170 mmHg  Venous Pressure (mmHg) 170 mmHg  Transmembrane Pressure (mmHg) 50 mmHg  Ultrafiltration Rate (mL/min) 860 mL/min  Dialysate Flow Rate (mL/min) 800 ml/min  Conductivity: Machine  14  HD Safety Checks Performed Yes  Intra-Hemodialysis Comments Progressing as prescribed

## 2018-04-17 NOTE — ED Notes (Signed)
ED TO INPATIENT HANDOFF REPORT  ED Nurse Name and Phone #:   S Name/Age/Gender Daniel Thomas 63 y.o. male Room/Bed: ED07A/ED07A  Code Status   Code Status: Prior  Home/SNF/Other Home Patient oriented to: self Is this baseline? Yes   Triage Complete: Triage complete  Chief Complaint sob  Triage Note Sob that began this morning, patient was unable to start dialysis today due to increased sob. As per ems sating 83% on room air.    Allergies No Known Allergies  Level of Care/Admitting Diagnosis ED Disposition    ED Disposition Condition [facility] Area: [place] [[id number]]  Level of Care: Med-Surg [16]  Diagnosis: Dialysis AV fistula malfunction [facility]) [[id number]]  Admitting Physician: Saundra Shelling [[id number]]  Attending Physician: Saundra Shelling [[id number]]  Estimated length of stay: past midnight tomorrow  Certification:: I certify this patient will need inpatient services for at least 2 midnights  Possible Covid Disease Patient Isolation: N/A  PT Class (Do Not Modify): Inpatient [101]  PT Acc Code (Do Not Modify): Private [1]       B Medical/Surgery History Past Medical History:  Diagnosis Date  . COPD (chronic obstructive pulmonary disease) ([place])   . Diabetes mellitus without complication ([place])   . Enlarged prostate   . ESRD on dialysis ([place])   . Heart murmur   . High cholesterol   . Hypertension    Past Surgical History:  Procedure Laterality Date  . DIALYSIS/PERMA CATHETER INSERTION N/A 01/12/2018   Procedure: DIALYSIS/PERMA CATHETER INSERTION;  Surgeon: Katha Cabal, MD;  Location: [place] [facility];  Service: Cardiovascular;  Laterality: N/A;     A IV Location/Drains/Wounds Patient Lines/Drains/Airways Status   Active Line/Drains/Airways    Name:   Placement date:   Placement time:   Site:   Days:   Peripheral IV 01/12/18 Left Antecubital   01/12/18    0651    Antecubital   95   Peripheral IV 01/12/18  Right Antecubital   01/12/18    0902    Antecubital   95   Hemodialysis Catheter Right Internal jugular   01/12/18    1118    Internal jugular   95          Intake/Output Last 24 hours No intake or output data in the 24 hours ending 04/17/18 1354  Labs/Imaging Results for orders placed or performed during the hospital encounter of 04/17/18 (from the past 48 hour(s))  CBC with Differential     Status: Abnormal   Collection Time: 04/17/18 12:28 PM  Result Value Ref Range   WBC 12.5 (H) 4.0 - 10.5 K/uL   RBC 4.72 4.22 - 5.81 MIL/uL   Hemoglobin 12.9 (L) 13.0 - 17.0 g/dL   HCT 40.3 39.0 - 52.0 %   MCV 85.4 80.0 - 100.0 fL   MCH 27.3 26.0 - 34.0 pg   MCHC 32.0 30.0 - 36.0 g/dL   RDW 14.6 11.5 - 15.5 %   Platelets 376 150 - 400 K/uL   nRBC 0.0 0.0 - 0.2 %   Neutrophils Relative % 70 %   Neutro Abs 8.9 (H) 1.7 - 7.7 K/uL   Lymphocytes Relative 17 %   Lymphs Abs 2.2 0.7 - 4.0 K/uL   Monocytes Relative 8 %   Monocytes Absolute 1.0 0.1 - 1.0 K/uL   Eosinophils Relative 3 %   Eosinophils Absolute 0.4 0.0 - 0.5 K/uL   Basophils Relative 1 %   Basophils Absolute  0.1 0.0 - 0.1 K/uL   Immature Granulocytes 1 %   Abs Immature Granulocytes 0.06 0.00 - 0.07 K/uL    Comment: Performed at [facility], [address] [place], [place] [address]  Comprehensive metabolic panel     Status: Abnormal   Collection Time: 04/17/18 12:28 PM  Result Value Ref Range   Sodium 134 (L) 135 - 145 mmol/L   Potassium 5.4 (H) 3.5 - 5.1 mmol/L   Chloride 102 98 - 111 mmol/L   CO2 20 (L) 22 - 32 mmol/L   Glucose, Bld 178 (H) 70 - 99 mg/dL   BUN 63 (H) 8 - 23 mg/dL   Creatinine, Ser 5.70 (H) 0.61 - 1.24 mg/dL   Calcium 8.6 (L) 8.9 - 10.3 mg/dL   Total Protein 8.3 (H) 6.5 - 8.1 g/dL   Albumin 4.1 3.5 - 5.0 g/dL   AST 23 15 - 41 U/L   ALT 28 0 - 44 U/L   Alkaline Phosphatase 154 (H) 38 - 126 U/L   Total Bilirubin 0.5 0.3 - 1.2 mg/dL   GFR calc non Af Amer 10 (L) >60 mL/min   GFR calc Af Amer  11 (L) >60 mL/min   Anion gap 12 5 - 15    Comment: Performed at [facility], [address] [place], [place] [address]  Brain natriuretic peptide     Status: Abnormal   Collection Time: 04/17/18 12:28 PM  Result Value Ref Range   B Natriuretic Peptide 753.0 (H) 0.0 - 100.0 pg/mL    Comment: Performed at [facility], [address] [place], [place] [address]  Troponin I - ONCE - STAT     Status: None   Collection Time: 04/17/18 12:28 PM  Result Value Ref Range   Troponin I <0.03 <0.03 ng/mL    Comment: Performed at [facility], [address], [place], [place] [address]   Dg Chest Portable 1 View  Result Date: 04/17/2018 CLINICAL DATA:  Shortness of breath.  Renal failure EXAM: PORTABLE CHEST 1 VIEW COMPARISON:  January 12, 2018. FINDINGS: Central catheter tip at cavoatrial junction. No pneumothorax. There is no appreciable edema or consolidation. Heart is upper normal in size with pulmonary vascularity normal. No adenopathy. No bone lesions. IMPRESSION: Central catheter as described without pneumothorax. No edema or consolidation. Heart upper normal in size. Electronically Signed   By: Lowella Grip III M.D.   On: 04/17/2018 12:58    Pending Labs [employer] (From admission, onward)    Start     Ordered   Signed and Held  CBC  (heparin)  Once,   R    Comments:  Baseline for heparin therapy IF NOT ALREADY DRAWN.  Notify MD if PLT < 100 K.    Signed and Held   Signed and Held  Creatinine, serum  (heparin)  Once,   R    Comments:  Baseline for heparin therapy IF NOT ALREADY DRAWN.    Signed and Held   Signed and Held  Basic metabolic panel  Tomorrow morning,   R     Signed and Held   Signed and Held  CBC  Tomorrow morning,   R     Signed and Held          Vitals/Pain Today's Vitals   04/17/18 1214 04/17/18 1215 04/17/18 1218 04/17/18 1321  BP:   (!) 176/81   Pulse:      Resp:      Temp:  TempSrc:      SpO2:      Weight:   69.4 kg    Height:  4\' 11"  (1.499 m)    PainSc: 2    0-No pain    Isolation Precautions No active isolations  Medications Medications  heparin 10000 UNIT/ML injection (has no administration in time range)  sodium chloride flush 0.9 % injection (has no administration in time range)  nitroGLYCERIN (NITROGLYN) 2 % ointment 1 inch (1 inch Topical Given 04/17/18 1246)    Mobility walks Moderate fall risk   Focused Assessments Renal Assessment Handoff:  Hemodialysis Schedule: Hemodialysis Schedule: Tuesday/Thursday/Saturday Last Hemodialysis date and time:    Restricted appendage: right chest      R Recommendations: See Admitting Provider Note  Report given to:   Additional Notes:

## 2018-04-17 NOTE — Progress Notes (Signed)
Post HD Assessment  Tx ended early per pt, d/t pt's complaint of needing to have BM/"cannot use bedpan". Voiced c/o frequent "diarrhea every time I take all of my medicine". Unable to narrow down correlation with specific med.   No SOB, O2 sat 99% on room air post tx. Removed 1.2 liters, tolerated well aside from the GI upset at the 2 hour mark. 1.5 hours remaining in tx when pt signed off.   04/17/18 1730  Neurological  Level of Consciousness Alert  Orientation Level Oriented X4  Respiratory  Respiratory Pattern Unlabored  Chest Assessment Chest expansion symmetrical  Bilateral Breath Sounds Diminished  Cardiac  Pulse Regular  ECG Monitor Yes  Cardiac Rhythm NSR  Ectopy Multifocal PVC's  Ectopy Frequency Rare  Vascular  R Radial Pulse +2  L Radial Pulse +2  Integumentary  Integumentary (WDL) X  Skin Color Appropriate for ethnicity  Musculoskeletal  Musculoskeletal (WDL) X  Generalized Weakness Yes  Gastrointestinal  Bowel Sounds Assessment Active  GU Assessment  Genitourinary (WDL) X  Genitourinary Symptoms Oliguria

## 2018-04-17 NOTE — ED Notes (Signed)
Admit physician at bedside

## 2018-04-17 NOTE — ED Triage Notes (Signed)
Sob that began this morning, patient was unable to start dialysis today due to increased sob. As per ems sating 83% on room air.

## 2018-04-17 NOTE — ED Notes (Signed)
Patient awaiting xray. nitro patch placed to lacw

## 2018-04-17 NOTE — ED Provider Notes (Addendum)
Daniel Thomas  ____________________________________________  Time seen: Approximately 12:40 PM  I have reviewed the triage vital signs and the nursing notes.   HISTORY  Chief Complaint Shortness of Breath    HPI Daniel Thomas is a 63 y.o. male with a history of COPD not on supplemental oxygen, ESRD on HD last dialyzed on Saturday, DM and HTN, presenting with dysfunctional PermCath and shortness of breath.  The patient reports that this morning he was feeling short of breath.  He presented to dialysis, where they were unable to access his PermCath and told him it needed to be changed.  EMS noted him to have O2 sats in the 80s on room air.  The patient denies any cough or cold symptoms, congestion or rhinorrhea, fevers or chills.  Past Medical History:  Diagnosis Date  . COPD (chronic obstructive pulmonary disease) ([place])   . Diabetes mellitus without complication ([place])   . Enlarged prostate   . Heart murmur   . High cholesterol   . Hypertension     Patient Active Problem List   Diagnosis Date Noted  . Hyperkalemia 01/12/2018  . 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 DIALYSIS/PERMA CATHETER INSERTION N/A 01/12/2018   Procedure: DIALYSIS/PERMA CATHETER INSERTION;  Surgeon: Katha Cabal, MD;  Location: [place] [facility];  Service: Cardiovascular;  Laterality: N/A;    Current Outpatient Rx  . Order #: [id number] Class: Print  . Order #: [id number] Class: Historical Med  . Order #: [id number] Class: Print  . Order #: [id number] Class: Print  . Order #: [id number] Class: Historical Med  . Order #: [id number] Class: Print  . Order #: [id number] Class: Print  . Order #: [id number] Class: Historical Med    Allergies Patient has no known allergies.  History reviewed. No pertinent family history.  Social History Social History   Tobacco Use  . Smoking status: Former  Smoker    Years: 20.00  . Smokeless tobacco: Never Used  Substance Use Topics  . Alcohol use: Never    Frequency: Never  . Drug use: Never    Review of Systems Constitutional: No fever/chills.  No lightheadedness or syncope.  Dysfunctional PermCath. Eyes: No visual changes. ENT: No sore throat. No congestion or rhinorrhea. Cardiovascular: Denies chest pain. Denies palpitations. Respiratory: Positive hypoxia and shortness of breath.  No cough. Gastrointestinal: No abdominal pain.  No nausea, no vomiting.  No diarrhea.  No constipation. Genitourinary: Negative for dysuria. Musculoskeletal: Negative for back pain. Skin: Negative for rash. Neurological: Negative for headaches. No focal numbness, tingling or weakness.     ____________________________________________   PHYSICAL EXAM:  VITAL SIGNS: ED Triage Vitals  Enc Vitals Group     BP 04/17/18 1218 (!) 176/81     Pulse Rate 04/17/18 1212 87     Resp 04/17/18 1212 (!) 26     Temp 04/17/18 1212 98.6 F (37 C)     Temp Source 04/17/18 1212 Oral     SpO2 04/17/18 1212 (!) 82 %     Weight 04/17/18 1215 153 lb (69.4 kg)     Height 04/17/18 1215 4\' 11"  (1.499 m)     Head Circumference --      Peak Flow --      Pain Score 04/17/18 1214 2     Pain Loc --      Pain Edu? --      Excl. in [place]? --     Constitutional: Alert and  oriented.  Answers questions appropriately.  Chronically ill-appearing. Eyes: Conjunctivae are injected bilaterally..  EOMI. No scleral icterus. Head: Atraumatic. Nose: No congestion. Neck: No stridor.  Supple.  Positive JVD.  No meningismus. Cardiovascular: Normal rate, regular rhythm. No murmurs, rubs or gallops.  Respiratory: The patient is tachypneic with accessory muscle use and retractions.  He is able to speak in 3 word sentences with some difficulty.  He has rhonchi in the bases bilaterally. Gastrointestinal: Overweight.  Soft, nontender and nondistended.  No guarding or rebound.  No peritoneal  signs. Musculoskeletal: No LE edema. No ttp in the calves or palpable cords.  Negative Homan's sign. Neurologic:  A&Ox3.  Speech is clear.  Face and smile are symmetric.  EOMI.  Moves all extremities well. Skin:  Skin is warm, dry and intact. No rash noted. Psychiatric: Mood and affect are normal. Speech and behavior are normal.  Normal judgement.  ____________________________________________   LABS (all labs ordered are listed, but only abnormal results are displayed)  Labs Reviewed  CBC WITH DIFFERENTIAL/PLATELET  COMPREHENSIVE METABOLIC PANEL   ____________________________________________  EKG  ED ECG REPORT I, Daniel Thomas, the attending physician, personally viewed and interpreted this ECG.   Date: 04/17/2018  EKG Time: 1215  Rate: 84  Rhythm: normal sinus rhythm  Axis: normal  Intervals:none  ST&T Change: No STEMI; poor baseline tracing as the patient is unable to hold still.  ____________________________________________  RADIOLOGY  No results found.  ____________________________________________   PROCEDURES  Procedure(s) performed: None  Procedures  Critical Care performed: Yes, see critical care Thomas(s) ____________________________________________   INITIAL IMPRESSION / ASSESSMENT AND PLAN / ED COURSE  Pertinent labs & imaging results that were available during my care of the patient were reviewed by me and considered in my medical decision making (see chart for details).  63 y.o. with a history of COPD and ESRD on HD presenting with shortness of breath and hypoxia to the 80s without any infectious symptoms.  The most likely etiology of the patient's shortness of breath and hypoxia is fluid or edema from needing to be dialyzed.  For that, I will treat him with nitroglycerin.  Certainly, COPD exacerbation is possible.  An infectious pathology is less likely without any other symptoms but in the setting of the coronavirus pandemic, this is on the  differential as well.  Would also consider heart failure but again the patient does not carry a history of this.  The patient will need to be admitted for ongoing evaluation and treatment.  I will contact nephrology at this time.  ----------------------------------------- 12:51 PM on 04/17/2018 -----------------------------------------  I have spoken to Daniel Thomas, the nephrologist on-call, who will dialyze the patient after access is reestablished.  I have also placed a call to Daniel Thomas, the vascular surgeon who placed the initial PermCath on 01/2018 for establishment of access.  The patient will be admitted to the hospitalist.  Daniel Thomas was evaluated in Emergency Department on 04/17/2018 for the symptoms described in the history of present illness. He was evaluated in the context of the global COVID-19 pandemic, which necessitated consideration that the patient might be at risk for infection with the SARS-CoV-2 virus that causes COVID-19. Institutional protocols and algorithms that pertain to the evaluation of patients at risk for COVID-19 are in a state of rapid change based on information released by regulatory bodies including the CDC and federal and state organizations. These policies and algorithms were followed during the patient's care in the ED.  CRITICAL  CARE Performed by: Daniel Thomas   Total critical care time: 35 minutes  Critical care time was exclusive of separately billable procedures and treating other patients.  Critical care was necessary to treat or prevent imminent or life-threatening deterioration.  Critical care was time spent personally by me on the following activities: development of treatment plan with patient and/or surrogate as well as nursing, discussions with consultants, evaluation of patient's response to treatment, examination of patient, obtaining history from patient or surrogate, ordering and performing treatments and interventions, ordering and  review of laboratory studies, ordering and review of radiographic studies, pulse oximetry and re-evaluation of patient's condition.   ____________________________________________  FINAL CLINICAL IMPRESSION(S) / ED DIAGNOSES  Final diagnoses:  None         NEW MEDICATIONS STARTED DURING THIS VISIT:  New Prescriptions   No medications on file      Daniel Listen, MD 04/17/18 1244    Daniel Listen, MD 04/17/18 1253

## 2018-04-17 NOTE — Progress Notes (Signed)
[facility] Communication Note   Patient with Right IJ Permcath Seen in ED by Dr. Delana Meyer Able to easily flush both ports Ports infused with heparin No vascular intervention needed at this time.  Marcelle Overlie PA-C 04/17/2018 2:17 PM

## 2018-04-17 NOTE — ED Notes (Signed)
Pt being transported to rm 214 at this time via stretcher.

## 2018-04-17 NOTE — Progress Notes (Signed)
HD Tx Start   04/17/18 1518  Vital Signs  Pulse Rate 86  Pulse Rate Source Monitor  Resp (!) 30  BP (!) 154/80  BP Location Right Arm  BP Method Automatic  Patient Position (if appropriate) Lying  Oxygen Therapy  SpO2 96 %  O2 Device Nasal Cannula  O2 Flow Rate (L/min) 4 L/min  During Hemodialysis Assessment  Blood Flow Rate (mL/min) 400 mL/min  Arterial Pressure (mmHg) -170 mmHg  Venous Pressure (mmHg) 150 mmHg  Transmembrane Pressure (mmHg) 40 mmHg  Ultrafiltration Rate (mL/min) 860 mL/min  Dialysate Flow Rate (mL/min) 800 ml/min  Conductivity: Machine  14  HD Safety Checks Performed Yes  Dialysis Fluid Bolus Normal Saline  Bolus Amount (mL) 250 mL  Dialysate Change 2K  Intra-Hemodialysis Comments Tx initiated  Hemodialysis Catheter Right Internal jugular  Placement Date/Time: 01/12/18 1118   Placed prior to admission: Yes  Time Out: Correct patient;Correct site;Correct procedure  Maximum sterile barrier precautions: Hand hygiene;Sterile gloves;Cap;Large sterile sheet;Mask;Sterile gown  Site Prep: Chlor...  Blue Lumen Status  (accessed)  Red Lumen Status  (accessed)  Dressing Type Gauze/Drain sponge  Dressing Status Clean;Dry;Intact  Interventions Dressing changed

## 2018-04-17 NOTE — ED Notes (Addendum)
Vascular  at bedside to heparinize racw groshunt

## 2018-04-17 NOTE — Progress Notes (Signed)
Pre HD Tx   04/17/18 1515  Hand-Off documentation  Report given to (Full Name) Louretta Shorten  Report received from (Full Name) [facility] RN 214  Vital Signs  Temp 98.7 F (37.1 C)  Temp Source Oral  Pulse Rate 87  Pulse Rate Source Monitor  Resp (!) 29  BP (!) 178/48  BP Location Right Arm  BP Method Automatic  Patient Position (if appropriate) Lying  Oxygen Therapy  SpO2 95 %  O2 Device Nasal Cannula  O2 Flow Rate (L/min) 4 L/min  Pain Assessment  Pain Scale 0-10  Pain Score 0  Dialysis Weight  Weight 69.4 kg  Type of Weight Pre-Dialysis  Time-Out for Hemodialysis  What Procedure? Hemodialysis  Pt Identifiers(min of two) First/Last Name;MRN/Account#  Correct Site? Yes  Correct Side? Yes  Correct Procedure? Yes  Consents Verified? Yes  Rad Studies Available? N/A  Safety Precautions Reviewed? Yes  Engineer, civil (consulting) Number 4  Station Number 4  UF/Alarm Test Passed  Conductivity: Meter 13.8  Conductivity: Machine  14  pH 7.4  Reverse Osmosis Main  Normal Saline Lot Number [id number]  Dialyzer Lot Number [id number]  Disposable Set Lot Number [id number]  Machine Temperature 98.6 F (37 C)  Musician and Audible Yes  Blood Lines Intact and Secured Yes  Pre Treatment Patient Checks  Vascular access used during treatment Catheter  Hemodialysis Consent Verified Yes  Hemodialysis Standing Orders Initiated Yes  ECG (Telemetry) Monitor On Yes  Prime Ordered Normal Saline  Length of  DialysisTreatment -hour(s) 3.5 Hour(s)  Dialysis Treatment Comments Na 140   Dialyzer Elisio 17H NR  Dialysate 2K, 2.5 Ca  Dialysate Flow Ordered 800  Blood Flow Rate Ordered 400 mL/min  Ultrafiltration Goal 2.5 Liters  Dialysis Blood Pressure Support Ordered Normal Saline  Education / Care Plan  Dialysis Education Provided Yes  Documented Education in Care Plan Yes  Hemodialysis Catheter Right Internal jugular  Placement Date/Time: 01/12/18 1118   Placed prior to admission: Yes   Time Out: Correct patient;Correct site;Correct procedure  Maximum sterile barrier precautions: Hand hygiene;Sterile gloves;Cap;Large sterile sheet;Mask;Sterile gown  Site Prep: Chlor...  Site Condition No complications  Blue Lumen Status Capped (Central line)  Red Lumen Status Capped (Central line)  Dressing Type Biopatch  Dressing Status Clean;Dry;Intact  Drainage Description None

## 2018-04-17 NOTE — Progress Notes (Signed)
Post HD Tx  Tx ended early per pt, d/t pt's complaint of needing to have BM/"cannot use bedpan". Voiced c/o frequent "diarrhea every time I take all of my medicine". Unable to narrow down correlation with specific med.   No SOB, O2 sat 99% on room air post tx. Removed 1.2 liters, tolerated well aside from the GI upset at the 2 hour mark. 1.5 hours remaining in tx when pt signed off.   04/17/18 1730  Vital Signs  Pulse Rate 86  Resp (!) 38  BP 111/62  Oxygen Therapy  SpO2 97 %  O2 Device Room Air  Pain Assessment  Pain Scale 0-10  Pain Score 0  Dialysis Weight  Weight 68.2 kg  Type of Weight Post-Dialysis  Post-Hemodialysis Assessment  Rinseback Volume (mL) 250 mL  KECN 47.7 V  Dialyzer Clearance Lightly streaked  Duration of HD Treatment -hour(s) 2 hour(s) (pt signed off early AMA)  Hemodialysis Intake (mL) 500 mL  UF Total -Machine (mL) 1700 mL  Net UF (mL) 1200 mL  Hemodialysis Catheter Right Internal jugular  Placement Date/Time: 01/12/18 1118   Placed prior to admission: Yes  Time Out: Correct patient;Correct site;Correct procedure  Maximum sterile barrier precautions: Hand hygiene;Sterile gloves;Cap;Large sterile sheet;Mask;Sterile gown  Site Prep: Chlor...  Site Condition No complications  Blue Lumen Status Flushed;Saline locked;Capped (Central line);Heparin locked  Red Lumen Status Flushed;Saline locked;Capped (Central line);Heparin locked  Purple Lumen Status N/A  Catheter fill solution Heparin 1000 units/ml  Catheter fill volume (Arterial) 1.5 cc  Catheter fill volume (Venous) 1.5  Dressing Type Biopatch;Gauze/Drain sponge  Dressing Status Clean;Dry;Intact  Interventions New dressing  Drainage Description None  Post treatment catheter status Capped and Clamped

## 2018-04-18 LAB — CBC
HCT: 35.6 % — ABNORMAL LOW (ref 39.0–52.0)
Hemoglobin: 11.5 g/dL — ABNORMAL LOW (ref 13.0–17.0)
MCH: 27.1 pg (ref 26.0–34.0)
MCHC: 32.3 g/dL (ref 30.0–36.0)
MCV: 84 fL (ref 80.0–100.0)
Platelets: 304 10*3/uL (ref 150–400)
RBC: 4.24 MIL/uL (ref 4.22–5.81)
RDW: 14.6 % (ref 11.5–15.5)
WBC: 10.9 10*3/uL — ABNORMAL HIGH (ref 4.0–10.5)
nRBC: 0 % (ref 0.0–0.2)

## 2018-04-18 LAB — HEPATITIS B SURFACE ANTIGEN: Hepatitis B Surface Ag: NEGATIVE

## 2018-04-18 LAB — BASIC METABOLIC PANEL
Anion gap: 10 (ref 5–15)
BUN: 41 mg/dL — ABNORMAL HIGH (ref 8–23)
CO2: 24 mmol/L (ref 22–32)
Calcium: 8.5 mg/dL — ABNORMAL LOW (ref 8.9–10.3)
Chloride: 102 mmol/L (ref 98–111)
Creatinine, Ser: 4.71 mg/dL — ABNORMAL HIGH (ref 0.61–1.24)
GFR calc Af Amer: 14 mL/min — ABNORMAL LOW (ref >60)
GFR calc non Af Amer: 12 mL/min — ABNORMAL LOW (ref >60)
Glucose, Bld: 118 mg/dL — ABNORMAL HIGH (ref 70–99)
Potassium: 4.3 mmol/L (ref 3.5–5.1)
Sodium: 136 mmol/L (ref 135–145)

## 2018-04-18 LAB — GLUCOSE, CAPILLARY: Glucose-Capillary: 127 mg/dL — ABNORMAL HIGH (ref 70–99)

## 2018-04-18 NOTE — Progress Notes (Signed)
Discharge order received. Patient is alert and oriented. Vital signs stable . No signs of acute distress. Discharge instructions given. Patient verbalized understanding. No other issues noted at this time.   

## 2018-04-18 NOTE — Discharge Summary (Signed)
[facility] NAME: Daniel Thomas    MR#:  [id number]  DATE OF BIRTH:  August 21, 1955  DATE OF ADMISSION:  04/17/2018 ADMITTING PHYSICIAN: Saundra Shelling, MD  DATE OF DISCHARGE: 04/18/2018  PRIMARY CARE PHYSICIAN: Marguerita Merles, MD    ADMISSION DIAGNOSIS:  sob  DISCHARGE DIAGNOSIS:  Active Problems:   Dialysis AV fistula malfunction ([place])   SECONDARY DIAGNOSIS:   Past Medical History:  Diagnosis Date  . COPD (chronic obstructive pulmonary disease) ([place])   . Diabetes mellitus without complication ([place])   . Enlarged prostate   . ESRD on dialysis ([place])   . Heart murmur   . High cholesterol   . Hypertension     HOSPITAL COURSE:   63 year old male with a history of diabetes and end-stage renal disease on hemodialysis who presented from dialysis due to issues with permacath shortness of breath.   1.  Shortness of breath: This is due to fluid overload from underlying end-stage renal disease not receiving dialysis. Shortness of breath has resolved after dialysis.  2.  End-stage renal disease on hemodialysis: Patient presented to ER due to issues with right IJ PermCath.  He was evaluated by vascular surgery.  Ports were infused with heparin and able to easily flush both ports.  This was used for dialysis.  He will remain on his Tuesday, Thursday and Saturday schedule. 3.  Hypertension: Patient will continue metoprolol, hydralazine, Norvasc  4.  Depression: Continue Celexa  5  Hyperlipidemia: Continue statin 6.  Hyperkalemia: This improved with dialysis.  DISCHARGE CONDITIONS AND DIET:   Stable  Renal diet  CONSULTS OBTAINED:    DRUG ALLERGIES:  No Known Allergies  DISCHARGE MEDICATIONS:   Allergies as of 04/18/2018   No Known Allergies     Medication List    TAKE these medications   amLODipine 10 MG tablet Commonly known as:  NORVASC Take 1 tablet (10 mg total) by mouth daily.   carvedilol 12.5 MG  tablet Commonly known as:  COREG Take 12.5 mg by mouth 2 (two) times daily with a meal.   citalopram 20 MG tablet Commonly known as:  CELEXA Take 20 mg by mouth daily.   hydrALAZINE 25 MG tablet Commonly known as:  APRESOLINE Take 1 tablet (25 mg total) by mouth every 8 (eight) hours.   linagliptin 5 MG Tabs tablet Commonly known as:  TRADJENTA Take 1 tablet (5 mg total) by mouth daily.   lovastatin 40 MG tablet Commonly known as:  MEVACOR 40 mg nightly.   metoprolol tartrate 50 MG tablet Commonly known as:  LOPRESSOR Take 1 tablet (50 mg total) by mouth 2 (two) times daily.   torsemide 20 MG tablet Commonly known as:  DEMADEX 2 tablets po daily   traZODone 50 MG tablet Commonly known as:  DESYREL Take 25 mg by mouth at bedtime as needed for sleep.   Vitamin D (Ergocalciferol) 1.25 MG (50000 UT) Caps capsule Commonly known as:  DRISDOL Take 50,000 Units by mouth once a week.         Today   CHIEF COMPLAINT:  No acute issues.  Patient denies shortness of breath or chest pain.   VITAL SIGNS:  Blood pressure (!) 141/83, pulse 79, temperature 98.3 F (36.8 C), temperature source Oral, resp. rate 16, height 4\' 11"  (1.499 m), weight 68.2 kg, SpO2 100 %.   REVIEW OF SYSTEMS:  Review of Systems  Constitutional: Negative.  Negative for chills, fever and malaise/fatigue.  HENT: Negative.  Negative for ear discharge, ear pain, hearing loss, nosebleeds and sore throat.   Eyes: Negative.  Negative for blurred vision and pain.  Respiratory: Negative.  Negative for cough, hemoptysis, shortness of breath and wheezing.   Cardiovascular: Negative.  Negative for chest pain, palpitations and leg swelling.  Gastrointestinal: Negative.  Negative for abdominal pain, blood in stool, diarrhea, nausea and vomiting.  Genitourinary: Negative.  Negative for dysuria.  Musculoskeletal: Negative.  Negative for back pain.  Skin: Negative.   Neurological: Negative for dizziness,  tremors, speech change, focal weakness, seizures and headaches.  Endo/Heme/Allergies: Negative.  Does not bruise/bleed easily.  Psychiatric/Behavioral: Negative.  Negative for depression, hallucinations and suicidal ideas.     PHYSICAL EXAMINATION:  GENERAL:  63 y.o.-year-old patient lying in the bed with no acute distress.  NECK:  Supple, no jugular venous distention. No thyroid enlargement, no tenderness.  LUNGS: Normal breath sounds bilaterally, no wheezing, rales,rhonchi  No use of accessory muscles of respiration.  CARDIOVASCULAR: S1, S2 normal. No murmurs, rubs, or gallops.  ABDOMEN: Soft, non-tender, non-distended. Bowel sounds present. No organomegaly or mass.  EXTREMITIES: No pedal edema, cyanosis, or clubbing.  PSYCHIATRIC: The patient is alert and oriented x 3.  SKIN: No obvious rash, lesion, or ulcer.   DATA REVIEW:   CBC Recent Labs  Lab 04/18/18 0452  WBC 10.9*  HGB 11.5*  HCT 35.6*  PLT 304    Chemistries  Recent Labs  Lab 04/17/18 1228 04/18/18 0452  NA 134* 136  K 5.4* 4.3  CL 102 102  CO2 20* 24  GLUCOSE 178* 118*  BUN 63* 41*  CREATININE 5.70* 4.71*  CALCIUM 8.6* 8.5*  AST 23  --   ALT 28  --   ALKPHOS 154*  --   BILITOT 0.5  --     Cardiac Enzymes Recent Labs  Lab 04/17/18 1228  TROPONINI <0.03    Microbiology Results  @MICRORSLT48 @  RADIOLOGY:  Dg Chest Portable 1 View  Result Date: 04/17/2018 CLINICAL DATA:  Shortness of breath.  Renal failure EXAM: PORTABLE CHEST 1 VIEW COMPARISON:  January 12, 2018. FINDINGS: Central catheter tip at cavoatrial junction. No pneumothorax. There is no appreciable edema or consolidation. Heart is upper normal in size with pulmonary vascularity normal. No adenopathy. No bone lesions. IMPRESSION: Central catheter as described without pneumothorax. No edema or consolidation. Heart upper normal in size. Electronically Signed   By: Lowella Grip III M.D.   On: 04/17/2018 12:58      Allergies as of  04/18/2018   No Known Allergies     Medication List    TAKE these medications   amLODipine 10 MG tablet Commonly known as:  NORVASC Take 1 tablet (10 mg total) by mouth daily.   carvedilol 12.5 MG tablet Commonly known as:  COREG Take 12.5 mg by mouth 2 (two) times daily with a meal.   citalopram 20 MG tablet Commonly known as:  CELEXA Take 20 mg by mouth daily.   hydrALAZINE 25 MG tablet Commonly known as:  APRESOLINE Take 1 tablet (25 mg total) by mouth every 8 (eight) hours.   linagliptin 5 MG Tabs tablet Commonly known as:  TRADJENTA Take 1 tablet (5 mg total) by mouth daily.   lovastatin 40 MG tablet Commonly known as:  MEVACOR 40 mg nightly.   metoprolol tartrate 50 MG tablet Commonly known as:  LOPRESSOR Take 1 tablet (50 mg total) by mouth 2 (two) times daily.   torsemide 20 MG tablet  Commonly known as:  DEMADEX 2 tablets po daily   traZODone 50 MG tablet Commonly known as:  DESYREL Take 25 mg by mouth at bedtime as needed for sleep.   Vitamin D (Ergocalciferol) 1.25 MG (50000 UT) Caps capsule Commonly known as:  DRISDOL Take 50,000 Units by mouth once a week.         Management plans discussed with the patient and he is in agreement. Stable for discharge home  Patient should follow up with pcp  CODE STATUS:     Code Status Orders  (From admission, onward)         Start     Ordered   04/17/18 1437  Full code  Continuous     04/17/18 1436        Code Status History    Date Active Date Inactive Code Status Order ID Comments User Context   01/12/2018 1658 01/16/2018 2207 Full Code [id number]  Bettey Costa, MD Inpatient   01/06/2018 2228 01/08/2018 1455 Full Code [id number]  Salary, Avel Peace, MD ED      TOTAL TIME TAKING CARE OF THIS PATIENT: 38 minutes.    Note: This dictation was prepared with Dragon dictation along with smaller phrase technology. Any transcriptional errors that result from this process are unintentional.  Bettey Costa M.D  on 04/18/2018 at 11:59 AM  Between 7am to 6pm - Pager - [phone] After 6pm go to [email or web address] - password [email or web address] [facility]  Office  [phone]  CC: Primary care physician; Marguerita Merles, MD

## 2018-05-21 ENCOUNTER — Other Ambulatory Visit: Payer: Self-pay

## 2018-08-01 ENCOUNTER — Other Ambulatory Visit (INDEPENDENT_AMBULATORY_CARE_PROVIDER_SITE_OTHER): Payer: Self-pay | Admitting: Vascular Surgery

## 2018-08-01 DIAGNOSIS — N186 End stage renal disease: Secondary | ICD-10-CM

## 2018-08-06 ENCOUNTER — Encounter (INDEPENDENT_AMBULATORY_CARE_PROVIDER_SITE_OTHER): Payer: BC Managed Care – PPO | Admitting: Vascular Surgery

## 2018-08-06 ENCOUNTER — Encounter (INDEPENDENT_AMBULATORY_CARE_PROVIDER_SITE_OTHER): Payer: BC Managed Care – PPO

## 2018-08-06 ENCOUNTER — Other Ambulatory Visit (INDEPENDENT_AMBULATORY_CARE_PROVIDER_SITE_OTHER): Payer: BC Managed Care – PPO

## 2018-08-20 ENCOUNTER — Encounter (INDEPENDENT_AMBULATORY_CARE_PROVIDER_SITE_OTHER): Payer: BC Managed Care – PPO

## 2018-08-20 ENCOUNTER — Other Ambulatory Visit (INDEPENDENT_AMBULATORY_CARE_PROVIDER_SITE_OTHER): Payer: BC Managed Care – PPO

## 2018-08-20 ENCOUNTER — Encounter (INDEPENDENT_AMBULATORY_CARE_PROVIDER_SITE_OTHER): Payer: BC Managed Care – PPO | Admitting: Vascular Surgery

## 2018-08-27 ENCOUNTER — Other Ambulatory Visit: Payer: Self-pay

## 2018-08-27 DIAGNOSIS — C61 Malignant neoplasm of prostate: Secondary | ICD-10-CM

## 2018-08-28 LAB — PSA: Prostate Specific Ag, Serum: 13.4 ng/mL — ABNORMAL HIGH (ref 0.0–4.0)

## 2018-08-29 ENCOUNTER — Encounter: Payer: Self-pay | Admitting: Urology

## 2018-08-29 ENCOUNTER — Other Ambulatory Visit: Payer: Self-pay

## 2018-08-29 ENCOUNTER — Ambulatory Visit (INDEPENDENT_AMBULATORY_CARE_PROVIDER_SITE_OTHER): Payer: BC Managed Care – PPO | Admitting: Urology

## 2018-08-29 VITALS — BP 224/99 | HR 98 | Ht 59.0 in | Wt 166.0 lb

## 2018-08-29 DIAGNOSIS — N184 Chronic kidney disease, stage 4 (severe): Secondary | ICD-10-CM

## 2018-08-29 DIAGNOSIS — C61 Malignant neoplasm of prostate: Secondary | ICD-10-CM

## 2018-08-29 NOTE — Progress Notes (Signed)
08/29/2018 11:43 AM   Daniel Thomas 01/31/1955 [id number]  Referring provider: Marguerita Merles, [address] [place],  [place] [address]  Chief Complaint  Patient presents with  . Prostate Cancer    HPI: 63 year old male with multiple medical problems including end-stage renal disease on hemodialysis who presents today for surveillance of low risk prostate cancer.  He was first diagnosed with this in 02/2018 at which time his PSA had jumped up to 20 from 10 the previous year.  Is also noted to have multiple nodules on rectal exam.  Prostate biopsy revealed Gleason 3+3 in 6 of 12 cores, primarily on the left up to 53% of the tissue.  Most recent PSA is trended back down now to 13.4 on 08/27/2018.  He does continue to make some urine although less than prior to the initiation of HD.  Gets up a couple times at night to void.  PMH: Past Medical History:  Diagnosis Date  . COPD (chronic obstructive pulmonary disease) ([place])   . Diabetes mellitus without complication ([place])   . Enlarged prostate   . ESRD on dialysis ([place])   . Heart murmur   . High cholesterol   . Hypertension     Surgical History: Past Surgical History:  Procedure Laterality Date  . DIALYSIS/PERMA CATHETER INSERTION N/A 01/12/2018   Procedure: DIALYSIS/PERMA CATHETER INSERTION;  Surgeon: Katha Cabal, MD;  Location: [facility];  Service: Cardiovascular;  Laterality: N/A;    Home Medications:  Allergies as of 08/29/2018   No Known Allergies     Medication List       Accurate as of August 29, 2018 11:59 PM. If you have any questions, ask your nurse or doctor.        amLODipine 10 MG tablet Commonly known as: NORVASC Take 1 tablet (10 mg total) by mouth daily.   carvedilol 12.5 MG tablet Commonly known as: COREG Take 12.5 mg by mouth 2 (two) times daily with a meal.   citalopram 20 MG tablet Commonly known as: CELEXA Take 20 mg by mouth daily.   hydrALAZINE 25 MG tablet  Commonly known as: APRESOLINE Take 1 tablet (25 mg total) by mouth every 8 (eight) hours.   linagliptin 5 MG Tabs tablet Commonly known as: TRADJENTA Take 1 tablet (5 mg total) by mouth daily.   lovastatin 40 MG tablet Commonly known as: MEVACOR 40 mg nightly.   metoprolol tartrate 50 MG tablet Commonly known as: LOPRESSOR Take 1 tablet (50 mg total) by mouth 2 (two) times daily.   torsemide 20 MG tablet Commonly known as: DEMADEX 2 tablets po daily   traZODone 50 MG tablet Commonly known as: DESYREL Take 25 mg by mouth at bedtime as needed for sleep.   Vitamin D (Ergocalciferol) 1.25 MG (50000 UT) Caps capsule Commonly known as: DRISDOL Take 50,000 Units by mouth once a week.       Allergies: No Known Allergies  Family History: No family history on file.  Social History:  reports that he has quit smoking. He quit after 20.00 years of use. He has never used smokeless tobacco. He reports that he does not drink alcohol or use drugs.  ROS: UROLOGY Frequent Urination?: No Hard to postpone urination?: No Burning/pain with urination?: No Get up at night to urinate?: No Leakage of urine?: No Urine stream starts and stops?: No Trouble starting stream?: No Do you have to strain to urinate?: No Blood in urine?: No Urinary tract infection?:  No Sexually transmitted disease?: No Injury to kidneys or bladder?: No Painful intercourse?: No Weak stream?: Yes Erection problems?: Yes Penile pain?: No  Gastrointestinal Nausea?: No Vomiting?: No Indigestion/heartburn?: No Diarrhea?: No Constipation?: No  Constitutional Fever: No Night sweats?: No Weight loss?: No Fatigue?: No  Skin Skin rash/lesions?: No Itching?: No  Eyes Blurred vision?: No Double vision?: No  Ears/Nose/Throat Sore throat?: No Sinus problems?: No  Hematologic/Lymphatic Swollen glands?: No Easy bruising?: No  Cardiovascular Leg swelling?: No Chest pain?: No  Respiratory Cough?:  No Shortness of breath?: No  Endocrine Excessive thirst?: No  Musculoskeletal Back pain?: No Joint pain?: No  Neurological Headaches?: No Dizziness?: No  Psychologic Depression?: No Anxiety?: No  Physical Exam: BP (!) 224/99   Pulse 98   Ht 4\' 11"  (1.499 m)   Wt 166 lb (75.3 kg)   BMI 33.53 kg/m   Constitutional:  Alert and oriented, No acute distress. HEENT: [place] AT, moist mucus membranes.  Trachea midline, no masses. Cardiovascular: No clubbing, cyanosis, or edema. Respiratory: Normal respiratory effort, no increased work of breathing. Skin: No rashes, bruises or suspicious lesions. Neurologic: Grossly intact, no focal deficits, moving all 4 extremities. Psychiatric: Normal mood and affect.  Laboratory Data: Lab Results  Component Value Date   WBC 10.9 (H) 04/18/2018   HGB 11.5 (L) 04/18/2018   HCT 35.6 (L) 04/18/2018   MCV 84.0 04/18/2018   PLT 304 04/18/2018    Lab Results  Component Value Date   CREATININE 4.71 (H) 04/18/2018   PSA as above  Lab Results  Component Value Date   HGBA1C 8.5 (H) 01/07/2018    Pertinent Imaging: N/a  Assessment & Plan:    1. Prostate cancer ([place]) History of Gleason 3+3 prostate cancer, technically intermediate risk based on abnormal rectal exam and PSA PSA is downward trending which is reassuring Given his multiple medical comorbidities, would prefer surveillance I recommended a prostate MRI to ensure that there is no significant aggressive or extracapsular disease which may change our approach Unable to give contrast due to end-stage renal disease We will obtain MRI without contrast and have the patient return to discuss He is agreeable this plan - MR PROSTATE WO CONTRAST; Future  2. CKD (chronic kidney disease) stage 4, GFR 15-29 ml/min ([facility]) On hemodialysis Being referred for consideration of transplant May affect our plan as above depending on whether or not he is a candidate  Return in about 6 weeks (around  10/10/2018) for f/u prostate MR.  Hollice Espy, MD  [facility] [address], [place],  [address] [phone]

## 2018-09-06 ENCOUNTER — Encounter (INDEPENDENT_AMBULATORY_CARE_PROVIDER_SITE_OTHER): Payer: BC Managed Care – PPO | Admitting: Vascular Surgery

## 2018-09-06 ENCOUNTER — Encounter (INDEPENDENT_AMBULATORY_CARE_PROVIDER_SITE_OTHER): Payer: BC Managed Care – PPO

## 2018-09-06 ENCOUNTER — Other Ambulatory Visit (INDEPENDENT_AMBULATORY_CARE_PROVIDER_SITE_OTHER): Payer: BC Managed Care – PPO

## 2018-09-06 ENCOUNTER — Telehealth: Payer: Self-pay | Admitting: Urology

## 2018-09-06 NOTE — Telephone Encounter (Signed)
There is a message from the scheduling dept that the MRI you ordered needs to be changed to a W and WO they did not say why? But they said they could not schedule it without a new order?  Please advise   Sharyn Lull

## 2018-09-07 NOTE — Telephone Encounter (Signed)
Please call them back and let them know back and let them know that I will NOT change the order.  His creatinine is too high to receive contrast.  This is why ordered it without contrast.  Hollice Espy, MD

## 2018-09-07 NOTE — Telephone Encounter (Signed)
No problem I called and spoke with them and they will document his chart and get him scheduled.  Thanks, Sharyn Lull

## 2018-09-28 ENCOUNTER — Encounter (INDEPENDENT_AMBULATORY_CARE_PROVIDER_SITE_OTHER): Payer: Self-pay | Admitting: Vascular Surgery

## 2018-10-10 ENCOUNTER — Ambulatory Visit: Payer: BC Managed Care – PPO | Admitting: Urology

## 2018-10-21 ENCOUNTER — Other Ambulatory Visit: Payer: BLUE CROSS/BLUE SHIELD

## 2018-10-26 ENCOUNTER — Inpatient Hospital Stay: Admission: RE | Admit: 2018-10-26 | Payer: BLUE CROSS/BLUE SHIELD | Source: Ambulatory Visit

## 2018-10-31 ENCOUNTER — Ambulatory Visit: Payer: BC Managed Care – PPO | Admitting: Urology

## 2018-11-21 ENCOUNTER — Ambulatory Visit: Payer: BC Managed Care – PPO | Admitting: Urology

## 2019-02-04 ENCOUNTER — Other Ambulatory Visit (INDEPENDENT_AMBULATORY_CARE_PROVIDER_SITE_OTHER): Payer: BC Managed Care – PPO

## 2019-02-04 ENCOUNTER — Encounter (INDEPENDENT_AMBULATORY_CARE_PROVIDER_SITE_OTHER): Payer: BC Managed Care – PPO | Admitting: Vascular Surgery

## 2019-02-04 ENCOUNTER — Encounter (INDEPENDENT_AMBULATORY_CARE_PROVIDER_SITE_OTHER): Payer: BC Managed Care – PPO

## 2019-02-18 ENCOUNTER — Encounter (INDEPENDENT_AMBULATORY_CARE_PROVIDER_SITE_OTHER): Payer: BC Managed Care – PPO

## 2019-02-18 ENCOUNTER — Encounter (INDEPENDENT_AMBULATORY_CARE_PROVIDER_SITE_OTHER): Payer: BC Managed Care – PPO | Admitting: Vascular Surgery

## 2019-02-18 ENCOUNTER — Other Ambulatory Visit (INDEPENDENT_AMBULATORY_CARE_PROVIDER_SITE_OTHER): Payer: BC Managed Care – PPO

## 2019-03-06 ENCOUNTER — Encounter (INDEPENDENT_AMBULATORY_CARE_PROVIDER_SITE_OTHER): Payer: Self-pay | Admitting: Vascular Surgery

## 2019-03-29 ENCOUNTER — Telehealth: Payer: Self-pay | Admitting: Urology

## 2019-03-29 NOTE — Telephone Encounter (Signed)
Pt sibling called asking to get scans sched, upon further review and discussion with Sharyn Lull, looks like the original order for a Prostate MRI has expired? New order needed? Please advise. Thanks

## 2019-05-12 ENCOUNTER — Emergency Department: Payer: Medicare Other

## 2019-05-12 ENCOUNTER — Inpatient Hospital Stay
Admission: EM | Admit: 2019-05-12 | Discharge: 2019-05-17 | DRG: 314 | Disposition: A | Discharge status: Home / Self Care | Payer: Medicare Other | Attending: Internal Medicine | Admitting: Internal Medicine

## 2019-05-12 ENCOUNTER — Encounter: Payer: Self-pay | Admitting: Emergency Medicine

## 2019-05-12 ENCOUNTER — Other Ambulatory Visit: Payer: Self-pay

## 2019-05-12 DIAGNOSIS — T827XXA Infection and inflammatory reaction due to other cardiac and vascular devices, implants and grafts, initial encounter: Principal | ICD-10-CM | POA: Diagnosis present

## 2019-05-12 DIAGNOSIS — E7800 Pure hypercholesterolemia, unspecified: Secondary | ICD-10-CM | POA: Diagnosis present

## 2019-05-12 DIAGNOSIS — N186 End stage renal disease: Secondary | ICD-10-CM | POA: Diagnosis present

## 2019-05-12 DIAGNOSIS — Z833 Family history of diabetes mellitus: Secondary | ICD-10-CM

## 2019-05-12 DIAGNOSIS — A419 Sepsis, unspecified organism: Secondary | ICD-10-CM | POA: Diagnosis present

## 2019-05-12 DIAGNOSIS — B957 Other staphylococcus as the cause of diseases classified elsewhere: Secondary | ICD-10-CM | POA: Diagnosis not present

## 2019-05-12 DIAGNOSIS — I429 Cardiomyopathy, unspecified: Secondary | ICD-10-CM | POA: Diagnosis present

## 2019-05-12 DIAGNOSIS — Z87891 Personal history of nicotine dependence: Secondary | ICD-10-CM

## 2019-05-12 DIAGNOSIS — M2578 Osteophyte, vertebrae: Secondary | ICD-10-CM | POA: Diagnosis present

## 2019-05-12 DIAGNOSIS — Y828 Other medical devices associated with adverse incidents: Secondary | ICD-10-CM | POA: Diagnosis present

## 2019-05-12 DIAGNOSIS — N185 Chronic kidney disease, stage 5: Secondary | ICD-10-CM | POA: Diagnosis not present

## 2019-05-12 DIAGNOSIS — L259 Unspecified contact dermatitis, unspecified cause: Secondary | ICD-10-CM | POA: Diagnosis present

## 2019-05-12 DIAGNOSIS — Z7984 Long term (current) use of oral hypoglycemic drugs: Secondary | ICD-10-CM

## 2019-05-12 DIAGNOSIS — E785 Hyperlipidemia, unspecified: Secondary | ICD-10-CM | POA: Diagnosis present

## 2019-05-12 DIAGNOSIS — Z20822 Contact with and (suspected) exposure to covid-19: Secondary | ICD-10-CM | POA: Diagnosis present

## 2019-05-12 DIAGNOSIS — E1122 Type 2 diabetes mellitus with diabetic chronic kidney disease: Secondary | ICD-10-CM | POA: Diagnosis present

## 2019-05-12 DIAGNOSIS — J449 Chronic obstructive pulmonary disease, unspecified: Secondary | ICD-10-CM | POA: Diagnosis present

## 2019-05-12 DIAGNOSIS — R7881 Bacteremia: Secondary | ICD-10-CM | POA: Diagnosis not present

## 2019-05-12 DIAGNOSIS — R509 Fever, unspecified: Secondary | ICD-10-CM

## 2019-05-12 DIAGNOSIS — D72829 Elevated white blood cell count, unspecified: Secondary | ICD-10-CM | POA: Diagnosis not present

## 2019-05-12 DIAGNOSIS — Z8249 Family history of ischemic heart disease and other diseases of the circulatory system: Secondary | ICD-10-CM

## 2019-05-12 DIAGNOSIS — Z825 Family history of asthma and other chronic lower respiratory diseases: Secondary | ICD-10-CM

## 2019-05-12 DIAGNOSIS — N2581 Secondary hyperparathyroidism of renal origin: Secondary | ICD-10-CM | POA: Diagnosis present

## 2019-05-12 DIAGNOSIS — D631 Anemia in chronic kidney disease: Secondary | ICD-10-CM | POA: Diagnosis present

## 2019-05-12 DIAGNOSIS — Z992 Dependence on renal dialysis: Secondary | ICD-10-CM | POA: Diagnosis not present

## 2019-05-12 DIAGNOSIS — K5900 Constipation, unspecified: Secondary | ICD-10-CM | POA: Diagnosis not present

## 2019-05-12 DIAGNOSIS — I120 Hypertensive chronic kidney disease with stage 5 chronic kidney disease or end stage renal disease: Secondary | ICD-10-CM | POA: Diagnosis present

## 2019-05-12 DIAGNOSIS — N400 Enlarged prostate without lower urinary tract symptoms: Secondary | ICD-10-CM | POA: Diagnosis present

## 2019-05-12 DIAGNOSIS — M47812 Spondylosis without myelopathy or radiculopathy, cervical region: Secondary | ICD-10-CM | POA: Diagnosis present

## 2019-05-12 DIAGNOSIS — A411 Sepsis due to other specified staphylococcus: Secondary | ICD-10-CM | POA: Diagnosis present

## 2019-05-12 DIAGNOSIS — E871 Hypo-osmolality and hyponatremia: Secondary | ICD-10-CM | POA: Diagnosis not present

## 2019-05-12 DIAGNOSIS — I161 Hypertensive emergency: Secondary | ICD-10-CM | POA: Diagnosis present

## 2019-05-12 DIAGNOSIS — I10 Essential (primary) hypertension: Secondary | ICD-10-CM | POA: Diagnosis not present

## 2019-05-12 DIAGNOSIS — Z79899 Other long term (current) drug therapy: Secondary | ICD-10-CM | POA: Diagnosis not present

## 2019-05-12 DIAGNOSIS — R6520 Severe sepsis without septic shock: Secondary | ICD-10-CM | POA: Diagnosis present

## 2019-05-12 LAB — CBC WITH DIFFERENTIAL/PLATELET
Abs Immature Granulocytes: 0.11 10*3/uL — ABNORMAL HIGH (ref 0.00–0.07)
Basophils Absolute: 0.1 10*3/uL (ref 0.0–0.1)
Basophils Relative: 0 %
Eosinophils Absolute: 0.1 10*3/uL (ref 0.0–0.5)
Eosinophils Relative: 0 %
HCT: 35.1 % — ABNORMAL LOW (ref 39.0–52.0)
Hemoglobin: 11.7 g/dL — ABNORMAL LOW (ref 13.0–17.0)
Immature Granulocytes: 1 %
Lymphocytes Relative: 3 %
Lymphs Abs: 0.7 10*3/uL (ref 0.7–4.0)
MCH: 29.7 pg (ref 26.0–34.0)
MCHC: 33.3 g/dL (ref 30.0–36.0)
MCV: 89.1 fL (ref 80.0–100.0)
Monocytes Absolute: 1.2 10*3/uL — ABNORMAL HIGH (ref 0.1–1.0)
Monocytes Relative: 6 %
Neutro Abs: 19.3 10*3/uL — ABNORMAL HIGH (ref 1.7–7.7)
Neutrophils Relative %: 90 %
Platelets: 316 10*3/uL (ref 150–400)
RBC: 3.94 MIL/uL — ABNORMAL LOW (ref 4.22–5.81)
RDW: 14.7 % (ref 11.5–15.5)
WBC: 21.4 10*3/uL — ABNORMAL HIGH (ref 4.0–10.5)
nRBC: 0 % (ref 0.0–0.2)

## 2019-05-12 LAB — COMPREHENSIVE METABOLIC PANEL
ALT: 23 U/L (ref 0–44)
AST: 23 U/L (ref 15–41)
Albumin: 4 g/dL (ref 3.5–5.0)
Alkaline Phosphatase: 117 U/L (ref 38–126)
Anion gap: 18 — ABNORMAL HIGH (ref 5–15)
BUN: 52 mg/dL — ABNORMAL HIGH (ref 8–23)
CO2: 21 mmol/L — ABNORMAL LOW (ref 22–32)
Calcium: 9.4 mg/dL (ref 8.9–10.3)
Chloride: 94 mmol/L — ABNORMAL LOW (ref 98–111)
Creatinine, Ser: 7.23 mg/dL — ABNORMAL HIGH (ref 0.61–1.24)
GFR calc Af Amer: 8 mL/min — ABNORMAL LOW (ref >60)
GFR calc non Af Amer: 7 mL/min — ABNORMAL LOW (ref >60)
Glucose, Bld: 120 mg/dL — ABNORMAL HIGH (ref 70–99)
Potassium: 3.6 mmol/L (ref 3.5–5.1)
Sodium: 133 mmol/L — ABNORMAL LOW (ref 135–145)
Total Bilirubin: 1 mg/dL (ref 0.3–1.2)
Total Protein: 7.5 g/dL (ref 6.5–8.1)

## 2019-05-12 LAB — PROTIME-INR
INR: 1.1 (ref 0.8–1.2)
Prothrombin Time: 14 seconds (ref 11.4–15.2)

## 2019-05-12 LAB — RESPIRATORY PANEL BY RT PCR (FLU A&B, COVID)
Influenza A by PCR: NEGATIVE
Influenza B by PCR: NEGATIVE
SARS Coronavirus 2 by RT PCR: NEGATIVE

## 2019-05-12 LAB — APTT: aPTT: 33 seconds (ref 24–36)

## 2019-05-12 LAB — LACTIC ACID, PLASMA: Lactic Acid, Venous: 1 mmol/L (ref 0.5–1.9)

## 2019-05-12 MED ORDER — ACETAMINOPHEN 500 MG PO TABS
1000.0000 mg | ORAL_TABLET | Freq: Once | ORAL | Status: AC
  Administered 2019-05-12: 1000 mg via ORAL
  Filled 2019-05-12: qty 2

## 2019-05-12 MED ORDER — ONDANSETRON HCL 4 MG PO TABS: 4.0000 mg | ORAL_TABLET | Freq: Four times a day (QID) | ORAL | Status: DC | PRN

## 2019-05-12 MED ORDER — ACETAMINOPHEN 325 MG PO TABS
650.0000 mg | ORAL_TABLET | Freq: Four times a day (QID) | ORAL | Status: DC | PRN
  Administered 2019-05-13: 650 mg via ORAL

## 2019-05-12 MED ORDER — HYDROXYZINE HCL 25 MG PO TABS: 25.0000 mg | ORAL_TABLET | Freq: Three times a day (TID) | ORAL | Status: DC | PRN

## 2019-05-12 MED ORDER — MAGIC MOUTHWASH
10.0000 mL | Freq: Four times a day (QID) | ORAL | Status: DC
  Administered 2019-05-13 – 2019-05-17 (×12): 10 mL via ORAL
  Filled 2019-05-12 (×22): qty 10

## 2019-05-12 MED ORDER — NEPRO/CARBSTEADY PO LIQD: 237.0000 mL | Freq: Three times a day (TID) | ORAL | Status: DC | PRN

## 2019-05-12 MED ORDER — ZOLPIDEM TARTRATE 5 MG PO TABS: 5.0000 mg | ORAL_TABLET | Freq: Every evening | ORAL | Status: DC | PRN

## 2019-05-12 MED ORDER — DOCUSATE SODIUM 283 MG RE ENEM: 1.0000 | ENEMA | RECTAL | Status: DC | PRN

## 2019-05-12 MED ORDER — HEPARIN SODIUM (PORCINE) 5000 UNIT/ML IJ SOLN
5000.0000 [IU] | Freq: Three times a day (TID) | INTRAMUSCULAR | Status: DC
  Administered 2019-05-13 – 2019-05-17 (×9): 5000 [IU] via SUBCUTANEOUS
  Filled 2019-05-12 (×12): qty 1

## 2019-05-12 MED ORDER — CALCIUM CARBONATE ANTACID 500 MG PO CHEW: 500.0000 mg | CHEWABLE_TABLET | Freq: Four times a day (QID) | ORAL | Status: DC | PRN

## 2019-05-12 MED ORDER — SORBITOL 70 % SOLN
30.0000 mL | Status: DC | PRN
  Filled 2019-05-12: qty 30

## 2019-05-12 MED ORDER — VANCOMYCIN HCL IN DEXTROSE 1-5 GM/200ML-% IV SOLN
1000.0000 mg | Freq: Once | INTRAVENOUS | Status: AC
  Administered 2019-05-12: 1000 mg via INTRAVENOUS
  Filled 2019-05-12: qty 200

## 2019-05-12 MED ORDER — METRONIDAZOLE IN NACL 5-0.79 MG/ML-% IV SOLN
500.0000 mg | Freq: Once | INTRAVENOUS | Status: AC
  Administered 2019-05-12: 500 mg via INTRAVENOUS
  Filled 2019-05-12: qty 100

## 2019-05-12 MED ORDER — CAMPHOR-MENTHOL 0.5-0.5 % EX LOTN
1.0000 "application " | TOPICAL_LOTION | Freq: Three times a day (TID) | CUTANEOUS | Status: DC | PRN
  Filled 2019-05-12: qty 222

## 2019-05-12 MED ORDER — ONDANSETRON HCL 4 MG/2ML IJ SOLN
4.0000 mg | Freq: Four times a day (QID) | INTRAMUSCULAR | Status: DC | PRN
  Administered 2019-05-13: 4 mg via INTRAVENOUS
  Filled 2019-05-12: qty 2

## 2019-05-12 MED ORDER — SODIUM CHLORIDE 0.9 % IV SOLN
2.0000 g | Freq: Once | INTRAVENOUS | Status: AC
  Administered 2019-05-12: 2 g via INTRAVENOUS
  Filled 2019-05-12: qty 2

## 2019-05-12 MED ORDER — ACETAMINOPHEN 650 MG RE SUPP: 650.0000 mg | Freq: Four times a day (QID) | RECTAL | Status: DC | PRN

## 2019-05-12 NOTE — ED Notes (Signed)
RN attempted to call report.   RN has informed ED Charge RN of reason for delay. Charge RN aware.

## 2019-05-12 NOTE — H&P (Signed)
History and Physical   BARRIE WALE YIF:[id number] DOB: May 08, 1955 DOA: 05/12/2019  Referring MD/NP/PA: Dr. Kerman Passey  PCP: Marguerita Merles, MD   Patient coming from: Home  Chief Complaint: Neck pain, shortness of breath and fever  HPI: Daniel Thomas is a 64 y.o. male with medical history significant of end-stage renal disease on hemodialysis on Tuesdays Thursdays and Saturdays, hypertension, diabetes currently not on medications, hypertension, hyperlipidemia and BPH who came to the ER complaining of neck pain shortness of breath and fever.  Patient went to his hemodialysis yesterday.  He has been on dialysis for about a year now.  He was doing fine afterwards but started having these fevers and chills and then the neck pain.  He came to the ER where he was evaluated.  No obvious source of infection at the moment except for his catheter.  He has a tunneled catheter has been in place for a year.  Observation of the catheter site showed several skin rashes but no obvious purulent discharge.  No evidence of pneumonia.  Patient suspected to have had fever from either line infection or another source and is being admitted to the hospital for treatment..  ED Course: Temperature is 103 blood pressure 170/75 pulse 118 respiratory 27 oxygen sat 96% on room air.  White count is 21.4 hemoglobin 11.7 platelets 316.  Sodium 133 potassium 3.6 chloride 94 CO2 21 BUN 52 creatinine 0.23 and calcium 9.4.  Glucose is 120 but lactic acid 1.0.  COVID-19 screen is negative.  Chest x-ray showed no acute findings.  Patient being admitted with sepsis probably due to blood borne infection.  Review of Systems: As per HPI otherwise 10 point review of systems negative.    Past Medical History:  Diagnosis Date  . COPD (chronic obstructive pulmonary disease) ([place])   . Diabetes mellitus without complication ([place])   . Enlarged prostate   . ESRD on dialysis ([place])   . Heart murmur   . High cholesterol   . Hypertension      Past Surgical History:  Procedure Laterality Date  . DIALYSIS/PERMA CATHETER INSERTION N/A 01/12/2018   Procedure: DIALYSIS/PERMA CATHETER INSERTION;  Surgeon: Katha Cabal, MD;  Location: [facility];  Service: Cardiovascular;  Laterality: N/A;     reports that he has quit smoking. He quit after 20.00 years of use. He has never used smokeless tobacco. He reports that he does not drink alcohol or use drugs.  No Known Allergies  History reviewed. No pertinent family history.   Prior to Admission medications   Medication Sig Start Date End Date Taking? Authorizing Provider  amLODipine (NORVASC) 10 MG tablet Take 1 tablet (10 mg total) by mouth daily. 01/08/18   Loletha Grayer, MD  carvedilol (COREG) 12.5 MG tablet Take 12.5 mg by mouth 2 (two) times daily with a meal. 03/31/18   [provider]  citalopram (CELEXA) 20 MG tablet Take 20 mg by mouth daily.    [provider]  hydrALAZINE (APRESOLINE) 25 MG tablet Take 1 tablet (25 mg total) by mouth every 8 (eight) hours. 01/08/18   Loletha Grayer, MD  linagliptin (TRADJENTA) 5 MG TABS tablet Take 1 tablet (5 mg total) by mouth daily. 01/09/18   Loletha Grayer, MD  lovastatin (MEVACOR) 40 MG tablet 40 mg nightly. 07/11/08   [provider]  metoprolol tartrate (LOPRESSOR) 50 MG tablet Take 1 tablet (50 mg total) by mouth 2 (two) times daily. 01/08/18   Loletha Grayer, MD  torsemide (DEMADEX) 20 MG tablet 2 tablets po daily 01/08/18   Loletha Grayer, MD  traZODone (DESYREL) 50 MG tablet Take 25 mg by mouth at bedtime as needed for sleep. 03/18/18   [provider]    Physical Exam: Vitals:   05/12/19 2134 05/12/19 2200 05/12/19 2230 05/12/19 2305  BP: (!) 149/77 128/64 138/64 (!) 163/73  Pulse: 90 87 85 88  Resp: 20 (!) 23 17 18   Temp:    98 F (36.7 C)  TempSrc:      SpO2: 98% 98% 99% 100%  Weight:      Height:          Constitutional: Anxious otherwise no acute  findings Vitals:   05/12/19 2134 05/12/19 2200 05/12/19 2230 05/12/19 2305  BP: (!) 149/77 128/64 138/64 (!) 163/73  Pulse: 90 87 85 88  Resp: 20 (!) 23 17 18   Temp:    98 F (36.7 C)  TempSrc:      SpO2: 98% 98% 99% 100%  Weight:      Height:       Eyes: PERRL, lids and conjunctivae normal ENMT: Mucous membranes are dry. Posterior pharynx clear of any exudate or lesions.Normal dentition.  Neck: normal, supple, no masses, no thyromegaly Respiratory: clear to auscultation bilaterally, no wheezing, no crackles. Normal respiratory effort. No accessory muscle use.  Cardiovascular: Sinus tachycardia, no murmurs / rubs / gallops. No extremity edema. 2+ pedal pulses. No carotid bruits.  Abdomen: no tenderness, no masses palpated. No hepatosplenomegaly. Bowel sounds positive.  Musculoskeletal: no clubbing / cyanosis. No joint deformity upper and lower extremities. Good ROM, no contractures. Normal muscle tone.  Skin: Patient has a tunneled catheter in the right upper chest wall, rashes around the catheter insertion site appears to be chronic dermatitis type rashes.  No purulent discharge Neurologic: CN 2-12 grossly intact. Sensation intact, DTR normal. Strength 5/5 in all 4.  Psychiatric: Normal judgment and insight. Alert and oriented x 3. Normal mood.     Labs on Admission: I have personally reviewed following labs and imaging studies  CBC: Recent Labs  Lab 05/12/19 1809  WBC 21.4*  NEUTROABS 19.3*  HGB 11.7*  HCT 35.1*  MCV 89.1  PLT 761   Basic Metabolic Panel: Recent Labs  Lab 05/12/19 1809  NA 133*  K 3.6  CL 94*  CO2 21*  GLUCOSE 120*  BUN 52*  CREATININE 7.23*  CALCIUM 9.4   GFR: Estimated Creatinine Clearance: 8.7 mL/min (A) (by C-G formula based on SCr of 7.23 mg/dL (H)). Liver Function Tests: Recent Labs  Lab 05/12/19 1809  AST 23  ALT 23  ALKPHOS 117  BILITOT 1.0  PROT 7.5  ALBUMIN 4.0   No results for input(s): LIPASE, AMYLASE in the last 168  hours. No results for input(s): AMMONIA in the last 168 hours. Coagulation Profile: Recent Labs  Lab 05/12/19 1809  INR 1.1   Cardiac Enzymes: No results for input(s): CKTOTAL, CKMB, CKMBINDEX, TROPONINI in the last 168 hours. BNP (last 3 results) No results for input(s): PROBNP in the last 8760 hours. HbA1C: No results for input(s): HGBA1C in the last 72 hours. CBG: No results for input(s): GLUCAP in the last 168 hours. Lipid Profile: No results for input(s): CHOL, HDL, LDLCALC, TRIG, CHOLHDL, LDLDIRECT in the last 72 hours. Thyroid Function Tests: No results for input(s): TSH, T4TOTAL, FREET4, T3FREE, THYROIDAB in the last 72 hours. Anemia Panel: No results for input(s): VITAMINB12, FOLATE, FERRITIN, TIBC, IRON, RETICCTPCT in the last 72  hours. Urine analysis:    Component Value Date/Time   COLORURINE COLORLESS (A) 01/07/2018 1230   APPEARANCEUR CLEAR (A) 01/07/2018 1230   LABSPEC 1.006 01/07/2018 1230   PHURINE 5.0 01/07/2018 1230   GLUCOSEU 50 (A) 01/07/2018 1230   HGBUR SMALL (A) 01/07/2018 1230   BILIRUBINUR NEGATIVE 01/07/2018 1230   KETONESUR NEGATIVE 01/07/2018 1230   PROTEINUR 100 (A) 01/07/2018 1230   NITRITE NEGATIVE 01/07/2018 1230   LEUKOCYTESUR NEGATIVE 01/07/2018 1230   Sepsis Labs: @LABRCNTIP (procalcitonin:4,lacticidven:4) ) Recent Results (from the past 240 hour(s))  Respiratory Panel by RT PCR (Flu A&B, Covid) - Nasopharyngeal Swab     Status: None   Collection Time: 05/12/19  6:10 PM   Specimen: Nasopharyngeal Swab  Result Value Ref Range Status   SARS Coronavirus 2 by RT PCR NEGATIVE NEGATIVE Final    Comment: (NOTE) SARS-CoV-2 target nucleic acids are NOT DETECTED. The SARS-CoV-2 RNA is generally detectable in upper respiratoy specimens during the acute phase of infection. The lowest concentration of SARS-CoV-2 viral copies this assay can detect is 131 copies/mL. A negative result does not preclude SARS-Cov-2 infection and should not be used  as the sole basis for treatment or other patient management decisions. A negative result may occur with  improper specimen collection/handling, submission of specimen other than nasopharyngeal swab, presence of viral mutation(s) within the areas targeted by this assay, and inadequate number of viral copies (<131 copies/mL). A negative result must be combined with clinical observations, patient history, and epidemiological information. The expected result is Negative. Fact Sheet for Patients:  PinkCheek.be Fact Sheet for Healthcare Providers:  GravelBags.it This test is not yet ap proved or cleared by the [place] FDA and  has been authorized for detection and/or diagnosis of SARS-CoV-2 by FDA under an Emergency Use Authorization (EUA). This EUA will remain  in effect (meaning this test can be used) for the duration of the COVID-19 declaration under Section 564(b)(1) of the Act, 21 U.S.C. section 360bbb-3(b)(1), unless the authorization is terminated or revoked sooner.    Influenza A by PCR NEGATIVE NEGATIVE Final   Influenza B by PCR NEGATIVE NEGATIVE Final    Comment: (NOTE) The Xpert Xpress SARS-CoV-2/FLU/RSV assay is intended as an aid in  the diagnosis of influenza from Nasopharyngeal swab specimens and  should not be used as a sole basis for treatment. Nasal washings and  aspirates are unacceptable for Xpert Xpress SARS-CoV-2/FLU/RSV  testing. Fact Sheet for Patients: PinkCheek.be Fact Sheet for Healthcare Providers: GravelBags.it This test is not yet approved or cleared by the [place] FDA and  has been authorized for detection and/or diagnosis of SARS-CoV-2 by  FDA under an Emergency Use Authorization (EUA). This EUA will remain  in effect (meaning this test can be used) for the duration of the  Covid-19 declaration under Section 564(b)(1) of the Act,  21  U.S.C. section 360bbb-3(b)(1), unless the authorization is  terminated or revoked. Performed at [facility], [address] [place], [place] [address]      Radiological Exams on Admission: DG Chest Portable 1 View  Result Date: 05/12/2019 CLINICAL DATA:  Sepsis EXAM: PORTABLE CHEST 1 VIEW COMPARISON:  04/17/2018 FINDINGS: There is a well-positioned tunneled dialysis catheter on the right. The heart size is stable. There are few hazy airspace opacities in the lung bases bilaterally favored to represent areas of atelectasis. There is no pneumothorax. No pleural effusion. There is mild vascular congestion without overt pulmonary edema. There is no acute osseous abnormality. IMPRESSION: No  active disease. Electronically Signed   By: Constance Holster M.D.   On: 05/12/2019 18:54    EKG: Independently reviewed.  It shows sinus tachycardia with a rate of 118, no significant ST changes  Assessment/Plan Principal Problem:   Sepsis ([place]) Active Problems:   Malignant hypertension   ESRD (end stage renal disease) ([facility])   Leucocytosis     #1 sepsis: No obvious source.  Suspect catheter infection.  Will obtain blood cultures and initiate patient on IV antibiotics.  Will do sepsis protocol per pharmacy including vancomycin cefepime and metronidazole.  If blood cultures are positive catheter may have to be discontinued and a new one is started.  Will also evaluate his neck pain as a possible source of infection probably discitis but less likely.  #2 neck pain: Appears to be secondary to possible degenerative disc disease.  In the setting of unknown source of his fever we will need to rule out discitis.  We will get a CT of the neck as a start.  Supportive care otherwise.  #3 end-stage renal disease: On hemodialysis.  Continue per nephrology.  #4 history of BPH: Stable.  Continue home regimen once confirmed.  #5 hypertension: Confirm home regimen and resume.   DVT prophylaxis:  Heparin Code Status: Full code Family Communication: No family at bedside Disposition Plan: Home Consults called: Consult nephrology in the morning Admission status: Inpatient  Severity of Illness: The appropriate patient status for this patient is INPATIENT. Inpatient status is judged to be reasonable and necessary in order to provide the required intensity of service to ensure the patient's safety. The patient's presenting symptoms, physical exam findings, and initial radiographic and laboratory data in the context of their chronic comorbidities is felt to place them at high risk for further clinical deterioration. Furthermore, it is not anticipated that the patient will be medically stable for discharge from the hospital within 2 midnights of admission. The following factors support the patient status of inpatient.   " The patient's presenting symptoms include fever and chills. " The worrisome physical exam findings include skin changes around catheter site. " The initial radiographic and laboratory data are worrisome because of evidence of sepsis. " The chronic co-morbidities include end-stage renal disease.   * I certify that at the point of admission it is my clinical judgment that the patient will require inpatient hospital care spanning beyond 2 midnights from the point of admission due to high intensity of service, high risk for further deterioration and high frequency of surveillance required.Barbette Merino MD [facility] Pager [phone]  If 7PM-7AM, please contact night-coverage [email or web address] Password [facility]  05/12/2019, 11:32 PM

## 2019-05-12 NOTE — ED Provider Notes (Signed)
[facility] Emergency Department Provider Note  Time seen: 6:42 PM  I have reviewed the triage vital signs and the nursing notes.   HISTORY  Chief Complaint Neck Pain, Shortness of Breath, and Fever   HPI Daniel Thomas is a 64 y.o. male with a past medical history of COPD, diabetes, hypertension, ESRD on HD presents to the emergency department for fever shortness of breath.  According to the patient he has been spacing neck pain over the past month or so.  Patient states he has been meaning to get it checked out but did not want to incur any bills.  He states this morning he began feeling somewhat short of breath and felt weak.  Patient found to be febrile to 103 in the emergency department.  He is tachycardic mildly tachypneic.  Patient denies any nausea vomiting diarrhea.  Patient has a right chest/subclavian catheter.  Past Medical History:  Diagnosis Date  . COPD (chronic obstructive pulmonary disease) ([place])   . Diabetes mellitus without complication ([place])   . Enlarged prostate   . ESRD on dialysis ([place])   . Heart murmur   . High cholesterol   . Hypertension     Patient Active Problem List   Diagnosis Date Noted  . Dialysis AV fistula malfunction ([place]) 04/17/2018  . Hyperkalemia 01/12/2018  . 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 DIALYSIS/PERMA CATHETER INSERTION N/A 01/12/2018   Procedure: DIALYSIS/PERMA CATHETER INSERTION;  Surgeon: Katha Cabal, MD;  Location: [facility];  Service: Cardiovascular;  Laterality: N/A;    Prior to Admission medications   Medication Sig Start Date End Date Taking? Authorizing Provider  amLODipine (NORVASC) 10 MG tablet Take 1 tablet (10 mg total) by mouth daily. 01/08/18   Loletha Grayer, MD  carvedilol (COREG) 12.5 MG tablet Take 12.5 mg by mouth 2 (two) times daily with a meal. 03/31/18   [provider]  citalopram (CELEXA) 20 MG tablet  Take 20 mg by mouth daily.    [provider]  hydrALAZINE (APRESOLINE) 25 MG tablet Take 1 tablet (25 mg total) by mouth every 8 (eight) hours. 01/08/18   Loletha Grayer, MD  linagliptin (TRADJENTA) 5 MG TABS tablet Take 1 tablet (5 mg total) by mouth daily. 01/09/18   Loletha Grayer, MD  lovastatin (MEVACOR) 40 MG tablet 40 mg nightly. 07/11/08   [provider]  metoprolol tartrate (LOPRESSOR) 50 MG tablet Take 1 tablet (50 mg total) by mouth 2 (two) times daily. 01/08/18   Loletha Grayer, MD  torsemide (DEMADEX) 20 MG tablet 2 tablets po daily 01/08/18   Loletha Grayer, MD  traZODone (DESYREL) 50 MG tablet Take 25 mg by mouth at bedtime as needed for sleep. 03/18/18   [provider]    No Known Allergies  History reviewed. No pertinent family history.  Social History Social History   Tobacco Use  . Smoking status: Former Smoker    Years: 20.00  . Smokeless tobacco: Never Used  Substance Use Topics  . Alcohol use: Never  . Drug use: Never    Review of Systems Constitutional: Found to have a fever to 103 in the emergency department. Cardiovascular: Negative for chest pain. Respiratory: Mild shortness of breath.  No significant cough. Gastrointestinal: Negative for abdominal pain, vomiting and diarrhea. Genitourinary: ESRD on HD last HD yesterday. Musculoskeletal: Negative for musculoskeletal complaints Neurological: Negative for headache All other ROS negative  ____________________________________________   PHYSICAL  EXAM:  VITAL SIGNS: ED Triage Vitals  Enc Vitals Group     BP 05/12/19 1800 (!) 170/75     Pulse Rate 05/12/19 1800 (!) 118     Resp 05/12/19 1800 (!) 22     Temp 05/12/19 1802 (!) 103 F (39.4 C)     Temp Source 05/12/19 1802 Oral     SpO2 05/12/19 1800 97 %     Weight 05/12/19 1802 168 lb (76.2 kg)     Height 05/12/19 1802 4\' 11"  (1.499 m)     Head Circumference --      Peak Flow --      Pain Score 05/12/19 1802 0      Pain Loc --      Pain Edu? --      Excl. in [place]? --     Constitutional: Alert and oriented. Well appearing and in no distress. Eyes: Normal exam ENT      Head: Normocephalic and atraumatic.      Mouth/Throat: Mucous membranes are moist. Cardiovascular: Regular rhythm rate around 120 bpm. Respiratory: Normal respiratory effort without tachypnea nor retractions. Breath sounds are clear.  No obvious wheeze rales or rhonchi.  Respiratory rate around 20. Gastrointestinal: Soft and nontender. No distention.   Musculoskeletal: Nontender with normal range of motion in all extremities. Neurologic:  Normal speech and language. No gross focal neurologic deficits Skin:  Skin is warm, dry.  No erythema or drainage around the subclavian catheter. Psychiatric: Mood and affect are normal.   ____________________________________________    EKG  EKG viewed and interpreted by myself shows sinus tachycardia 118 bpm with a slightly widened QRS, left axis deviation, largely normal intervals with nonspecific ST changes.  ____________________________________________    RADIOLOGY  No acute abnormality  ____________________________________________   INITIAL IMPRESSION / ASSESSMENT AND PLAN / ED COURSE  Pertinent labs & imaging results that were available during my care of the patient were reviewed by me and considered in my medical decision making (see chart for details).   Patient presents to the emergency department found to be febrile to 103, complaining of some shortness of breath.  Also states neck pain but states this has been ongoing by a month.  No signs of meningismus.  Patient's lab work has begun to resolve showing a white blood cell count of 21,000 and again concerning for sepsis.  We will check cultures, start the patient on broad-spectrum antibiotics.  We will obtain a chest x-ray and continue to closely monitor.  Patient will require admission to the hospital once his ED work-up is been  completed.  Patient does state itching around the subclavian catheter insertion site but no obvious signs of infection.  Patient has received both Covid vaccinations the last of which was approximately 3 weeks ago.  No obvious source for the patient's fever.  However given his itching around his catheter insertion site, a possible source could be the dialysis catheter or bacteremia.  We will continue the patient on IV antibiotics admit to the hospitalist service while awaiting culture results.  Daniel Thomas was evaluated in Emergency Department on 05/12/2019 for the symptoms described in the history of present illness. He was evaluated in the context of the global COVID-19 pandemic, which necessitated consideration that the patient might be at risk for infection with the SARS-CoV-2 virus that causes COVID-19. Institutional protocols and algorithms that pertain to the evaluation of patients at risk for COVID-19 are in a state of rapid change based on information  released by regulatory bodies including the CDC and federal and state organizations. These policies and algorithms were followed during the patient's care in the ED.  CRITICAL CARE Performed by: Harvest Dark   Total critical care time: 30 minutes  Critical care time was exclusive of separately billable procedures and treating other patients.  Critical care was necessary to treat or prevent imminent or life-threatening deterioration.  Critical care was time spent personally by me on the following activities: development of treatment plan with patient and/or surrogate as well as nursing, discussions with consultants, evaluation of patient's response to treatment, examination of patient, obtaining history from patient or surrogate, ordering and performing treatments and interventions, ordering and review of laboratory studies, ordering and review of radiographic studies, pulse oximetry and re-evaluation of patient's  condition.  ____________________________________________   FINAL CLINICAL IMPRESSION(S) / ED DIAGNOSES  Sepsis   Harvest Dark, MD 05/12/19 1945

## 2019-05-12 NOTE — ED Notes (Signed)
[facility] RN Ena Dawley is transporting patient to floor.

## 2019-05-12 NOTE — Progress Notes (Signed)
CODE SEPSIS - PHARMACY COMMUNICATION  **Broad Spectrum Antibiotics should be administered within 1 hour of Sepsis diagnosis**  Time Code Sepsis Called/Page Received: 5/9 @ 1811   Antibiotics Ordered:  Cefepime , Metronidazole, Vancomycin   Time of 1st antibiotic administration:   Cefepime @ gm on 5/9 @ 1828   Additional action taken by pharmacy:   If necessary, Name of Provider/Nurse Contacted:     Bruce Churilla D ,PharmD Clinical Pharmacist  05/12/2019  7:00 PM

## 2019-05-12 NOTE — ED Triage Notes (Signed)
Pt via EMS from home. Pt called EMS for SOB and chills for about a week. Pt also c/o neck pain at home. Pt took Tylenol at home approx an hour ago. Denies neck pain at this time. Denies SOB at this time. Pt A&Ox4 and NAD at this time.

## 2019-05-12 NOTE — Consult Note (Deleted)
CODE SEPSIS - PHARMACY COMMUNICATION  **Broad Spectrum Antibiotics should be administered within 1 hour of Sepsis diagnosis**  Time Code Sepsis Called/Page Received: 1810  Antibiotics Ordered: cefepime, metronidazole, vancomycin  Time of 1st antibiotic administration: cefepime given 5/9 @ 1828, metronidazole given 5/9 @ 1832  Additional action taken by pharmacy: NA  If necessary, Name of Provider/Nurse Contacted: NA    Gerald Dexter ,PharmD 05/12/2019  7:00 PM

## 2019-05-13 ENCOUNTER — Encounter
Admission: EM | Disposition: A | Discharge status: Home / Self Care | Payer: Self-pay | Source: Home / Self Care | Attending: Internal Medicine

## 2019-05-13 ENCOUNTER — Inpatient Hospital Stay: Payer: Medicare Other

## 2019-05-13 DIAGNOSIS — A419 Sepsis, unspecified organism: Secondary | ICD-10-CM

## 2019-05-13 DIAGNOSIS — R509 Fever, unspecified: Secondary | ICD-10-CM

## 2019-05-13 DIAGNOSIS — Z992 Dependence on renal dialysis: Secondary | ICD-10-CM

## 2019-05-13 DIAGNOSIS — N186 End stage renal disease: Secondary | ICD-10-CM

## 2019-05-13 HISTORY — PX: DIALYSIS/PERMA CATHETER REMOVAL: CATH118289

## 2019-05-13 LAB — BLOOD CULTURE ID PANEL (REFLEXED)

## 2019-05-13 LAB — COMPREHENSIVE METABOLIC PANEL
ALT: 20 U/L (ref 0–44)
AST: 20 U/L (ref 15–41)
Albumin: 3.5 g/dL (ref 3.5–5.0)
Alkaline Phosphatase: 99 U/L (ref 38–126)
Anion gap: 15 (ref 5–15)
BUN: 57 mg/dL — ABNORMAL HIGH (ref 8–23)
CO2: 25 mmol/L (ref 22–32)
Calcium: 9.2 mg/dL (ref 8.9–10.3)
Chloride: 93 mmol/L — ABNORMAL LOW (ref 98–111)
Creatinine, Ser: 8.12 mg/dL — ABNORMAL HIGH (ref 0.61–1.24)
GFR calc Af Amer: 7 mL/min — ABNORMAL LOW (ref >60)
GFR calc non Af Amer: 6 mL/min — ABNORMAL LOW (ref >60)
Glucose, Bld: 121 mg/dL — ABNORMAL HIGH (ref 70–99)
Potassium: 3.5 mmol/L (ref 3.5–5.1)
Sodium: 133 mmol/L — ABNORMAL LOW (ref 135–145)
Total Bilirubin: 0.7 mg/dL (ref 0.3–1.2)
Total Protein: 7 g/dL (ref 6.5–8.1)

## 2019-05-13 LAB — CBC
HCT: 30.6 % — ABNORMAL LOW (ref 39.0–52.0)
Hemoglobin: 10.7 g/dL — ABNORMAL LOW (ref 13.0–17.0)
MCH: 30.4 pg (ref 26.0–34.0)
MCHC: 35 g/dL (ref 30.0–36.0)
MCV: 86.9 fL (ref 80.0–100.0)
Platelets: 273 10*3/uL (ref 150–400)
RBC: 3.52 MIL/uL — ABNORMAL LOW (ref 4.22–5.81)
RDW: 14.7 % (ref 11.5–15.5)
WBC: 27.4 10*3/uL — ABNORMAL HIGH (ref 4.0–10.5)
nRBC: 0 % (ref 0.0–0.2)

## 2019-05-13 LAB — MRSA PCR SCREENING: MRSA by PCR: NEGATIVE

## 2019-05-13 SURGERY — DIALYSIS/PERMA CATHETER REMOVAL

## 2019-05-13 MED ORDER — HYDRALAZINE HCL 25 MG PO TABS
25.0000 mg | ORAL_TABLET | Freq: Three times a day (TID) | ORAL | Status: DC
  Administered 2019-05-13 – 2019-05-17 (×10): 25 mg via ORAL
  Filled 2019-05-13 (×12): qty 1

## 2019-05-13 MED ORDER — LINAGLIPTIN 5 MG PO TABS
5.0000 mg | ORAL_TABLET | Freq: Every day | ORAL | Status: DC
  Administered 2019-05-14 – 2019-05-17 (×3): 5 mg via ORAL
  Filled 2019-05-13 (×5): qty 1

## 2019-05-13 MED ORDER — CITALOPRAM HYDROBROMIDE 20 MG PO TABS
20.0000 mg | ORAL_TABLET | Freq: Every day | ORAL | Status: DC
  Administered 2019-05-13 – 2019-05-17 (×3): 20 mg via ORAL
  Filled 2019-05-13 (×3): qty 1

## 2019-05-13 MED ORDER — VANCOMYCIN HCL IN DEXTROSE 1-5 GM/200ML-% IV SOLN
1000.0000 mg | Freq: Once | INTRAVENOUS | Status: AC
  Administered 2019-05-13: 1000 mg via INTRAVENOUS
  Filled 2019-05-13 (×2): qty 200

## 2019-05-13 MED ORDER — PRAVASTATIN SODIUM 20 MG PO TABS
40.0000 mg | ORAL_TABLET | Freq: Every day | ORAL | Status: DC
  Administered 2019-05-14 – 2019-05-17 (×3): 40 mg via ORAL
  Filled 2019-05-13 (×3): qty 2

## 2019-05-13 MED ORDER — METRONIDAZOLE IN NACL 5-0.79 MG/ML-% IV SOLN
500.0000 mg | Freq: Three times a day (TID) | INTRAVENOUS | Status: DC
  Administered 2019-05-13: 500 mg via INTRAVENOUS
  Filled 2019-05-13 (×3): qty 100

## 2019-05-13 MED ORDER — CHLORHEXIDINE GLUCONATE CLOTH 2 % EX PADS: 6.0000 | MEDICATED_PAD | Freq: Every day | CUTANEOUS | Status: DC

## 2019-05-13 MED ORDER — FERRIC CITRATE 1 GM 210 MG(FE) PO TABS
210.0000 mg | ORAL_TABLET | Freq: Three times a day (TID) | ORAL | Status: DC
  Administered 2019-05-14 – 2019-05-17 (×7): 210 mg via ORAL
  Filled 2019-05-13 (×13): qty 1

## 2019-05-13 MED ORDER — SENNOSIDES-DOCUSATE SODIUM 8.6-50 MG PO TABS
2.0000 | ORAL_TABLET | Freq: Two times a day (BID) | ORAL | Status: DC
  Administered 2019-05-13 – 2019-05-17 (×7): 2 via ORAL
  Filled 2019-05-13 (×7): qty 2

## 2019-05-13 MED ORDER — CARVEDILOL 12.5 MG PO TABS
12.5000 mg | ORAL_TABLET | Freq: Two times a day (BID) | ORAL | Status: DC
  Administered 2019-05-13 – 2019-05-17 (×8): 12.5 mg via ORAL
  Filled 2019-05-13 (×9): qty 1

## 2019-05-13 MED ORDER — ASPIRIN-ACETAMINOPHEN-CAFFEINE 250-250-65 MG PO TABS
1.0000 | ORAL_TABLET | Freq: Three times a day (TID) | ORAL | Status: DC | PRN
  Administered 2019-05-13 – 2019-05-14 (×3): 1 via ORAL
  Filled 2019-05-13 (×4): qty 1

## 2019-05-13 MED ORDER — AMLODIPINE BESYLATE 10 MG PO TABS
10.0000 mg | ORAL_TABLET | Freq: Every day | ORAL | Status: DC
  Administered 2019-05-13 – 2019-05-16 (×3): 10 mg via ORAL
  Filled 2019-05-13 (×3): qty 1

## 2019-05-13 MED ORDER — METRONIDAZOLE IN NACL 5-0.79 MG/ML-% IV SOLN
500.0000 mg | Freq: Three times a day (TID) | INTRAVENOUS | Status: DC
  Filled 2019-05-13 (×2): qty 100

## 2019-05-13 MED ORDER — SODIUM CHLORIDE 0.9 % IV SOLN
INTRAVENOUS | Status: DC | PRN
  Administered 2019-05-13: 250 mL via INTRAVENOUS

## 2019-05-13 MED ORDER — SODIUM CHLORIDE 0.9 % IV SOLN
1.0000 g | INTRAVENOUS | Status: DC
  Administered 2019-05-13: 1 g via INTRAVENOUS
  Filled 2019-05-13 (×2): qty 1

## 2019-05-13 MED ORDER — VANCOMYCIN HCL IN DEXTROSE 1-5 GM/200ML-% IV SOLN
1000.0000 mg | INTRAVENOUS | Status: DC
  Filled 2019-05-13: qty 200

## 2019-05-13 MED ORDER — TRAZODONE HCL 50 MG PO TABS: 25.0000 mg | ORAL_TABLET | Freq: Every evening | ORAL | Status: DC | PRN

## 2019-05-13 SURGICAL SUPPLY — 2 items
FORCEPS HALSTEAD CVD 5IN STRL (INSTRUMENTS) ×1 IMPLANT
TRAY LACERAT/PLASTIC (MISCELLANEOUS) ×1 IMPLANT

## 2019-05-13 NOTE — Progress Notes (Signed)
PROGRESS NOTE    Daniel Thomas  BUY:[id number] DOB: 1955/02/19 DOA: 05/12/2019 PCP: Marguerita Merles, MD  Brief Narrative:  Daniel Thomas is a 64 y.o. male with medical history significant of end-stage renal disease on hemodialysis on Tuesdays Thursdays and Saturdays, hypertension, diabetes currently not on medications, hypertension, hyperlipidemia and BPH who came to the ER complaining of neck pain shortness of breath and fever.  Upon arrival in the emergency room, he had a temperature of 103, leukocytosis with a white cell count of 21.4.  Covid was negative.  Chest x-ray had no acute changes.  He has not made any urine.  Patient was diagnosed with sepsis, with potential line infection.  He is placed on vancomycin, cefepime and Flagyl.   Assessment & Plan:   Principal Problem:   Sepsis ([place]) Active Problems:   Malignant hypertension   ESRD (end stage renal disease) ([facility])   Leucocytosis  #1.  Sepsis. Etiology unclear.  Suspect line infection with permacath.  Blood culture is still pending.  Continue current antibiotic coverage.  2.  Neck pain. CT scan showed degenerative disc disease, no evidence of osteomyelitis.  Continue symptomatic treatment.  3.  End-stage renal disease.   I have notified nephrology.  4.  Benign prostate hypertrophy. Continue home medicine.  5.  Essential hypertension. Continue home medicine.   DVT prophylaxis: Heparin Code Status: Full Family Communication: Treatment plan updated to the patient and his brother in the room. Disposition Plan:  . Patient came from: Home            . Anticipated d/c place: Home . Barriers to d/c OR conditions which need to be met to effect a safe d/c:   Consultants:   Nephrology  Procedures: None Antimicrobials: Vancomycin Cefepime Flagyl Subjective: Patient does not feel feverish.  He has a good appetite, but he was nauseated this morning, he also has some constipation. He has not made any urine since last  night. Denies any short of breath or cough.  Objective: Vitals:   05/13/19 0411 05/13/19 0500 05/13/19 0828 05/13/19 0829  BP: (!) 157/68  (!) 172/78 (!) 167/80  Pulse: 82  85 84  Resp: 20  16   Temp: 98 F (36.7 C)  97.8 F (36.6 C)   TempSrc:      SpO2: 100%  99% 99%  Weight:  73.1 kg    Height:  4' 11"  (1.499 m)      Intake/Output Summary (Last 24 hours) at 05/13/2019 1126 Last data filed at 05/13/2019 1004 Gross per 24 hour  Intake 320 ml  Output --  Net 320 ml   Filed Weights   05/12/19 1802 05/13/19 0500  Weight: 76.2 kg 73.1 kg    Examination:  General exam: Appears calm and comfortable  Respiratory system: Clear to auscultation. Respiratory effort normal. Cardiovascular system: S1 & S2 heard, RRR. No JVD, murmurs, rubs, gallops or clicks. No pedal edema. Gastrointestinal system: Abdomen is nondistended, soft and nontender. No organomegaly or masses felt. Normal bowel sounds heard. Central nervous system: Alert and oriented. No focal neurological deficits. Extremities: Symmetric 5 x 5 power. Skin: No rashes, lesions or ulcers Psychiatry: Judgement and insight appear normal. Mood & affect appropriate.   Right chest permacath site no evidence of local infection.  Data Reviewed: I have personally reviewed following labs and imaging studies  CBC: Recent Labs  Lab 05/12/19 1809 05/13/19 0547  WBC 21.4* 27.4*  NEUTROABS 19.3*  --   HGB 11.7* 10.7*  HCT 35.1* 30.6*  MCV 89.1 86.9  PLT 316 073   Basic Metabolic Panel: Recent Labs  Lab 05/12/19 1809 05/13/19 0547  NA 133* 133*  K 3.6 3.5  CL 94* 93*  CO2 21* 25  GLUCOSE 120* 121*  BUN 52* 57*  CREATININE 7.23* 8.12*  CALCIUM 9.4 9.2   GFR: Estimated Creatinine Clearance: 7.6 mL/min (A) (by C-G formula based on SCr of 8.12 mg/dL (H)). Liver Function Tests: Recent Labs  Lab 05/12/19 1809 05/13/19 0547  AST 23 20  ALT 23 20  ALKPHOS 117 99  BILITOT 1.0 0.7  PROT 7.5 7.0  ALBUMIN 4.0 3.5    No results for input(s): LIPASE, AMYLASE in the last 168 hours. No results for input(s): AMMONIA in the last 168 hours. Coagulation Profile: Recent Labs  Lab 05/12/19 1809  INR 1.1   Cardiac Enzymes: No results for input(s): CKTOTAL, CKMB, CKMBINDEX, TROPONINI in the last 168 hours. BNP (last 3 results) No results for input(s): PROBNP in the last 8760 hours. HbA1C: No results for input(s): HGBA1C in the last 72 hours. CBG: No results for input(s): GLUCAP in the last 168 hours. Lipid Profile: No results for input(s): CHOL, HDL, LDLCALC, TRIG, CHOLHDL, LDLDIRECT in the last 72 hours. Thyroid Function Tests: No results for input(s): TSH, T4TOTAL, FREET4, T3FREE, THYROIDAB in the last 72 hours. Anemia Panel: No results for input(s): VITAMINB12, FOLATE, FERRITIN, TIBC, IRON, RETICCTPCT in the last 72 hours. Sepsis Labs: Recent Labs  Lab 05/12/19 1809  LATICACIDVEN 1.0    Recent Results (from the past 240 hour(s))  Culture, blood (Routine x 2)     Status: None (Preliminary result)   Collection Time: 05/12/19  6:09 PM   Specimen: BLOOD  Result Value Ref Range Status   Specimen Description BLOOD LEFT FA  Final   Special Requests   Final    BOTTLES DRAWN AEROBIC AND ANAEROBIC Blood Culture adequate volume   Culture   Final    NO GROWTH < 24 HOURS Performed at [facility], [address] [place], [place] [address]    Report Status PENDING  Incomplete  Culture, blood (Routine x 2)     Status: None (Preliminary result)   Collection Time: 05/12/19  6:09 PM   Specimen: BLOOD  Result Value Ref Range Status   Specimen Description BLOOD L HAND  Final   Special Requests   Final    BOTTLES DRAWN AEROBIC AND ANAEROBIC Blood Culture adequate volume   Culture  Setup Time   Final    Organism ID to follow GRAM POSITIVE COCCI ANAEROBIC BOTTLE ONLY CRITICAL RESULT CALLED TO, READ BACK BY AND VERIFIED WITH: Performed at [facility], [address]  [place], [place] [address]    Culture GRAM POSITIVE COCCI  Final   Report Status PENDING  Incomplete  Respiratory Panel by RT PCR (Flu A&B, Covid) - Nasopharyngeal Swab     Status: None   Collection Time: 05/12/19  6:10 PM   Specimen: Nasopharyngeal Swab  Result Value Ref Range Status   SARS Coronavirus 2 by RT PCR NEGATIVE NEGATIVE Final    Comment: (NOTE) SARS-CoV-2 target nucleic acids are NOT DETECTED. The SARS-CoV-2 RNA is generally detectable in upper respiratoy specimens during the acute phase of infection. The lowest concentration of SARS-CoV-2 viral copies this assay can detect is 131 copies/mL. A negative result does not preclude SARS-Cov-2 infection and should not be used as the sole basis for treatment or other patient management decisions. A  negative result may occur with  improper specimen collection/handling, submission of specimen other than nasopharyngeal swab, presence of viral mutation(s) within the areas targeted by this assay, and inadequate number of viral copies (<131 copies/mL). A negative result must be combined with clinical observations, patient history, and epidemiological information. The expected result is Negative. Fact Sheet for Patients:  PinkCheek.be Fact Sheet for Healthcare Providers:  GravelBags.it This test is not yet ap proved or cleared by the [place] FDA and  has been authorized for detection and/or diagnosis of SARS-CoV-2 by FDA under an Emergency Use Authorization (EUA). This EUA will remain  in effect (meaning this test can be used) for the duration of the COVID-19 declaration under Section 564(b)(1) of the Act, 21 U.S.C. section 360bbb-3(b)(1), unless the authorization is terminated or revoked sooner.    Influenza A by PCR NEGATIVE NEGATIVE Final   Influenza B by PCR NEGATIVE NEGATIVE Final    Comment: (NOTE) The Xpert Xpress SARS-CoV-2/FLU/RSV assay is intended as an aid in   the diagnosis of influenza from Nasopharyngeal swab specimens and  should not be used as a sole basis for treatment. Nasal washings and  aspirates are unacceptable for Xpert Xpress SARS-CoV-2/FLU/RSV  testing. Fact Sheet for Patients: PinkCheek.be Fact Sheet for Healthcare Providers: GravelBags.it This test is not yet approved or cleared by the [place] FDA and  has been authorized for detection and/or diagnosis of SARS-CoV-2 by  FDA under an Emergency Use Authorization (EUA). This EUA will remain  in effect (meaning this test can be used) for the duration of the  Covid-19 declaration under Section 564(b)(1) of the Act, 21  U.S.C. section 360bbb-3(b)(1), unless the authorization is  terminated or revoked. Performed at [facility], [address] [place], [place] [address]   MRSA PCR Screening     Status: None   Collection Time: 05/13/19 12:04 AM   Specimen: Nasopharyngeal  Result Value Ref Range Status   MRSA by PCR NEGATIVE NEGATIVE Final    Comment:        The GeneXpert MRSA Assay (FDA approved for NASAL specimens only), is one component of a comprehensive MRSA colonization surveillance program. It is not intended to diagnose MRSA infection nor to guide or monitor treatment for MRSA infections. Performed at [facility], [address] [place], [place] [address]          Radiology Studies: MR CERVICAL SPINE WO CONTRAST  Result Date: 05/13/2019 CLINICAL DATA:  Darrick Grinder initial evaluation for acute sepsis, concern for discitis. EXAM: MRI CERVICAL SPINE WITHOUT CONTRAST TECHNIQUE: Multiplanar, multisequence MR imaging of the cervical spine was performed. No intravenous contrast was administered. COMPARISON:  None available. FINDINGS: Alignment: Straightening of the normal cervical lordosis. No listhesis or subluxation. Vertebrae: Vertebral body height maintained without evidence for acute  or chronic fracture. Bone marrow signal intensity within normal limits. No discrete or worrisome osseous lesions. Discogenic reactive endplate changes present about the C3-4 interspace. No other abnormal marrow edema. No findings to suggest osteomyelitis discitis or septic arthritis. Cord: Signal intensity within the cervical spinal cord is normal. No epidural collections identified. Posterior Fossa, vertebral arteries, paraspinal tissues: Note made of a empty sella. Visualized brain and posterior fossa otherwise unremarkable. Sphenoid sinus disease partially visualized. Craniocervical junction normal. Paraspinous and prevertebral soft tissues within normal limits. Normal flow voids seen within the vertebral arteries bilaterally. Disc levels: C2-C3: Mild uncovertebral hypertrophy without significant disc bulge. No canal or foraminal stenosis. C3-C4: Chronic intervertebral disc space narrowing with diffuse  degenerative disc osteophyte. Flattening and effacement of the ventral thecal sac with resultant moderate spinal stenosis. Mild cord flattening without cord signal changes. Severe bilateral C4 foraminal stenosis. C4-C5: Mild disc bulge with uncovertebral hypertrophy. No significant spinal stenosis. Mild bilateral C5 foraminal narrowing. C5-C6: Mild disc bulge with uncovertebral spurring. No significant spinal stenosis. Mild left C6 foraminal stenosis. No significant right foraminal narrowing. C6-C7: Normal interspace. Mild facet degeneration on the right. No canal or foraminal stenosis. C7-T1:  Unremarkable. Visualized upper thoracic spine demonstrates no significant finding. IMPRESSION: 1. No evidence for osteomyelitis discitis or septic arthritis within the cervical spine. No other acute infection identified. 2. Degenerative disc osteophyte at C3-4 with resultant moderate canal with severe bilateral C4 foraminal stenosis. 3. Mild bilateral C5 and left C6 foraminal narrowing related to disc bulge and uncovertebral  disease. Electronically Signed   By: Jeannine Boga M.D.   On: 05/13/2019 03:11   DG Chest Portable 1 View  Result Date: 05/12/2019 CLINICAL DATA:  Sepsis EXAM: PORTABLE CHEST 1 VIEW COMPARISON:  04/17/2018 FINDINGS: There is a well-positioned tunneled dialysis catheter on the right. The heart size is stable. There are few hazy airspace opacities in the lung bases bilaterally favored to represent areas of atelectasis. There is no pneumothorax. No pleural effusion. There is mild vascular congestion without overt pulmonary edema. There is no acute osseous abnormality. IMPRESSION: No active disease. Electronically Signed   By: Constance Holster M.D.   On: 05/12/2019 18:54        Scheduled Meds: . Chlorhexidine Gluconate Cloth  6 each Topical Daily  . Chlorhexidine Gluconate Cloth  6 each Topical Q0600  . heparin  5,000 Units Subcutaneous Q8H  . magic mouthwash  10 mL Oral QID   Continuous Infusions: . ceFEPime (MAXIPIME) IV    . metronidazole 500 mg (05/13/19 0452)  . [START ON 05/14/2019] vancomycin       LOS: 1 day    Time spent: 25 minutes    Sharen Hones, MD [facility]   To contact the attending provider between 7A-7P or the covering provider during after hours 7P-7A, please log into the web site [email or web address] and access using universal [place] password for that web site. If you do not have the password, please call the hospital operator.  05/13/2019, 11:26 AM

## 2019-05-13 NOTE — Progress Notes (Signed)
Hemodialysis patient known at [facility], also known as [address], TTS 7:50am. Per clinic patient self transports. Please contact me with any dialysis placement concerns.  Elvera Bicker Dialysis Coordinator [phone]

## 2019-05-13 NOTE — Progress Notes (Signed)
[place] attempted visit per OR for AD education; per RN, pt. @ dialysis.  [place] will attempt follow-up later or make referral to evening chaplain.    05/13/19 1515  Clinical Encounter Type  Visited With Patient not available  Visit Type Initial (Advanced Directive Education)

## 2019-05-13 NOTE — Progress Notes (Signed)
05/13/2019 7:53 PM During shift change hand off, patient asked about his home blood pressure medicines.  He had not received them since admission.  I explained that usually the attending would order those meds as appropriate.  Patient's brother in the room began insisting I contact the doctor to get home meds ordered.  I agreed that I would certainly do that. Brother continued to angrily demand these medicines get ordered. Explained pre-hemodialysis protocol and that we were checking patient vital signs throughout the day.  Brother said "Get it ordered now."  I said that I would be contacting the doctor to take care of this when I was finished with bedside hand off.  After hand off was finished, I contacted doctor on call [place] to request patient's home meds be ordered and let night shift RN Crystal know he had been contacted on this matter.  Dola Argyle, RN

## 2019-05-13 NOTE — Progress Notes (Signed)
PHARMACY - PHYSICIAN COMMUNICATION CRITICAL VALUE ALERT - BLOOD CULTURE IDENTIFICATION (BCID)  [name] is an 64 y.o. male who presented to [facility] on 05/12/2019 with a chief complaint of neck pain shortness of breath and fever.  Assessment:  CoNS bacteremia,  suspect line infection with permacath  Name of physician (or Provider) Contacted: Elenora Gamma, MD  Current antibiotics: vancomycin, cefepime and metronidazole  Changes to prescribed antibiotics recommended:   Continue vancomycin, continue cefepime for 24 hours and stop metronidazole  Recommendations accepted by provider  Results for orders placed or performed during the hospital encounter of 05/12/19  Blood Culture ID Panel (Reflexed) (Collected: 05/12/2019  6:09 PM)  Result Value Ref Range   Enterococcus species NOT DETECTED NOT DETECTED   Listeria monocytogenes NOT DETECTED NOT DETECTED   Staphylococcus species DETECTED (A) NOT DETECTED   Staphylococcus aureus (BCID) NOT DETECTED NOT DETECTED   Methicillin resistance NOT DETECTED NOT DETECTED   Streptococcus species NOT DETECTED NOT DETECTED   Streptococcus agalactiae NOT DETECTED NOT DETECTED   Streptococcus pneumoniae NOT DETECTED NOT DETECTED   Streptococcus pyogenes NOT DETECTED NOT DETECTED   Acinetobacter baumannii NOT DETECTED NOT DETECTED   Enterobacteriaceae species NOT DETECTED NOT DETECTED   Enterobacter cloacae complex NOT DETECTED NOT DETECTED   Escherichia coli NOT DETECTED NOT DETECTED   Klebsiella oxytoca NOT DETECTED NOT DETECTED   Klebsiella pneumoniae NOT DETECTED NOT DETECTED   Proteus species NOT DETECTED NOT DETECTED   Serratia marcescens NOT DETECTED NOT DETECTED   Haemophilus influenzae NOT DETECTED NOT DETECTED   Neisseria meningitidis NOT DETECTED NOT DETECTED   Pseudomonas aeruginosa NOT DETECTED NOT DETECTED   Candida albicans NOT DETECTED NOT DETECTED   Candida glabrata NOT DETECTED NOT DETECTED   Candida krusei NOT DETECTED NOT  DETECTED   Candida parapsilosis NOT DETECTED NOT DETECTED   Candida tropicalis NOT DETECTED NOT DETECTED    Dallie Piles 05/13/2019  12:17 PM

## 2019-05-13 NOTE — Progress Notes (Signed)
[facility]  ROUNDING NOTE   Subjective:  Patient comes in with fever per report. Normally dialyzes on TTS schedule. Currently it is suspected that his dialysis catheter is a source of infection. Blood culture is showing positivity for Staphylococcus which is coagulase-negative.   Objective:  Vital signs in last 24 hours:  Temp:  [97.5 F (36.4 C)-103 F (39.4 C)] 99.8 F (37.7 C) (05/10 1200) Pulse Rate:  [81-118] 81 (05/10 1300) Resp:  [15-27] 19 (05/10 1300) BP: (115-203)/(55-96) 160/78 (05/10 1300) SpO2:  [96 %-100 %] 100 % (05/10 1200) Weight:  [73.1 kg-76.2 kg] 73.1 kg (05/10 0500)  Weight change:  Filed Weights   05/12/19 1802 05/13/19 0500  Weight: 76.2 kg 73.1 kg    Intake/Output: I/O last 3 completed shifts: In: 200 [IV Piggyback:200] Out: -    Intake/Output this shift:  Total I/O In: 120 [P.O.:120] Out: -   Physical Exam: General: No acute distress  Head: Normocephalic, atraumatic. Moist oral mucosal membranes  Eyes: Anicteric  Neck: Supple, trachea midline  Lungs:  Clear to auscultation, normal effort  Heart: S1S2 no rubs  Abdomen:  Soft, nontender, bowel sounds present  Extremities: Trace peripheral edema.  Neurologic: Awake, alert, following commands  Skin: No lesions  Access: Right IJ PermCath    Basic Metabolic Panel: Recent Labs  Lab 05/12/19 1809 05/13/19 0547  NA 133* 133*  K 3.6 3.5  CL 94* 93*  CO2 21* 25  GLUCOSE 120* 121*  BUN 52* 57*  CREATININE 7.23* 8.12*  CALCIUM 9.4 9.2    Liver Function Tests: Recent Labs  Lab 05/12/19 1809 05/13/19 0547  AST 23 20  ALT 23 20  ALKPHOS 117 99  BILITOT 1.0 0.7  PROT 7.5 7.0  ALBUMIN 4.0 3.5   No results for input(s): LIPASE, AMYLASE in the last 168 hours. No results for input(s): AMMONIA in the last 168 hours.  CBC: Recent Labs  Lab 05/12/19 1809 05/13/19 0547  WBC 21.4* 27.4*  NEUTROABS 19.3*  --   HGB 11.7* 10.7*  HCT 35.1* 30.6*  MCV 89.1 86.9  PLT  316 273    Cardiac Enzymes: No results for input(s): CKTOTAL, CKMB, CKMBINDEX, TROPONINI in the last 168 hours.  BNP: Invalid input(s): POCBNP  CBG: No results for input(s): GLUCAP in the last 168 hours.  Microbiology: Results for orders placed or performed during the hospital encounter of 05/12/19  Culture, blood (Routine x 2)     Status: None (Preliminary result)   Collection Time: 05/12/19  6:09 PM   Specimen: BLOOD  Result Value Ref Range Status   Specimen Description BLOOD LEFT FA  Final   Special Requests   Final    BOTTLES DRAWN AEROBIC AND ANAEROBIC Blood Culture adequate volume   Culture  Setup Time   Final    GRAM POSITIVE COCCI ANAEROBIC BOTTLE ONLY CRITICAL VALUE NOTED.  VALUE IS CONSISTENT WITH PREVIOUSLY REPORTED AND CALLED VALUE. Performed at [facility], [address] [place], [place] [address]    Culture [facility] POSITIVE COCCI  Final   Report Status PENDING  Incomplete  Culture, blood (Routine x 2)     Status: None (Preliminary result)   Collection Time: 05/12/19  6:09 PM   Specimen: BLOOD  Result Value Ref Range Status   Specimen Description BLOOD L HAND  Final   Special Requests   Final    BOTTLES DRAWN AEROBIC AND ANAEROBIC Blood Culture adequate volume   Culture  Setup Time   Final  Organism ID to follow GRAM POSITIVE COCCI ANAEROBIC BOTTLE ONLY CRITICAL RESULT CALLED TO, READ BACK BY AND VERIFIED WITH: CHRISTINE KATSOUDAS AT [id number] ON 05/13/19 SNG Performed at [facility], [address] [place], [place] [address]    Culture [facility] POSITIVE COCCI  Final   Report Status PENDING  Incomplete  Blood Culture ID Panel (Reflexed)     Status: Abnormal   Collection Time: 05/12/19  6:09 PM  Result Value Ref Range Status   Enterococcus species NOT DETECTED NOT DETECTED Final   Listeria monocytogenes NOT DETECTED NOT DETECTED Final   Staphylococcus species DETECTED (A) NOT DETECTED Final    Comment: Methicillin (oxacillin) susceptible  coagulase negative staphylococcus. Possible blood culture contaminant (unless isolated from more than one blood culture draw or clinical case suggests pathogenicity). No antibiotic treatment is indicated for blood  culture contaminants. CRITICAL RESULT CALLED TO, READ BACK BY AND VERIFIED WITH: CHRISTINE KATSOUDAS AT [id number] ON 05/13/19 SNG    Staphylococcus aureus (BCID) NOT DETECTED NOT DETECTED Final   Methicillin resistance NOT DETECTED NOT DETECTED Final   Streptococcus species NOT DETECTED NOT DETECTED Final   Streptococcus agalactiae NOT DETECTED NOT DETECTED Final   Streptococcus pneumoniae NOT DETECTED NOT DETECTED Final   Streptococcus pyogenes NOT DETECTED NOT DETECTED Final   Acinetobacter baumannii NOT DETECTED NOT DETECTED Final   Enterobacteriaceae species NOT DETECTED NOT DETECTED Final   Enterobacter cloacae complex NOT DETECTED NOT DETECTED Final   Escherichia coli NOT DETECTED NOT DETECTED Final   Klebsiella oxytoca NOT DETECTED NOT DETECTED Final   Klebsiella pneumoniae NOT DETECTED NOT DETECTED Final   Proteus species NOT DETECTED NOT DETECTED Final   Serratia marcescens NOT DETECTED NOT DETECTED Final   Haemophilus influenzae NOT DETECTED NOT DETECTED Final   Neisseria meningitidis NOT DETECTED NOT DETECTED Final   Pseudomonas aeruginosa NOT DETECTED NOT DETECTED Final   Candida albicans NOT DETECTED NOT DETECTED Final   Candida glabrata NOT DETECTED NOT DETECTED Final   Candida krusei NOT DETECTED NOT DETECTED Final   Candida parapsilosis NOT DETECTED NOT DETECTED Final   Candida tropicalis NOT DETECTED NOT DETECTED Final    Comment: Performed at [facility], [address] [place], [place] [address]  Respiratory Panel by RT PCR (Flu A&B, Covid) - Nasopharyngeal Swab     Status: None   Collection Time: 05/12/19  6:10 PM   Specimen: Nasopharyngeal Swab  Result Value Ref Range Status   SARS Coronavirus 2 by RT PCR NEGATIVE NEGATIVE Final    Comment:  (NOTE) SARS-CoV-2 target nucleic acids are NOT DETECTED. The SARS-CoV-2 RNA is generally detectable in upper respiratoy specimens during the acute phase of infection. The lowest concentration of SARS-CoV-2 viral copies this assay can detect is 131 copies/mL. A negative result does not preclude SARS-Cov-2 infection and should not be used as the sole basis for treatment or other patient management decisions. A negative result may occur with  improper specimen collection/handling, submission of specimen other than nasopharyngeal swab, presence of viral mutation(s) within the areas targeted by this assay, and inadequate number of viral copies (<131 copies/mL). A negative result must be combined with clinical observations, patient history, and epidemiological information. The expected result is Negative. Fact Sheet for Patients:  PinkCheek.be Fact Sheet for Healthcare Providers:  GravelBags.it This test is not yet ap proved or cleared by the [place] FDA and  has been authorized for detection and/or diagnosis of SARS-CoV-2 by FDA under an Emergency Use Authorization (EUA). This EUA will  remain  in effect (meaning this test can be used) for the duration of the COVID-19 declaration under Section 564(b)(1) of the Act, 21 U.S.C. section 360bbb-3(b)(1), unless the authorization is terminated or revoked sooner.    Influenza A by PCR NEGATIVE NEGATIVE Final   Influenza B by PCR NEGATIVE NEGATIVE Final    Comment: (NOTE) The Xpert Xpress SARS-CoV-2/FLU/RSV assay is intended as an aid in  the diagnosis of influenza from Nasopharyngeal swab specimens and  should not be used as a sole basis for treatment. Nasal washings and  aspirates are unacceptable for Xpert Xpress SARS-CoV-2/FLU/RSV  testing. Fact Sheet for Patients: PinkCheek.be Fact Sheet for Healthcare  Providers: GravelBags.it This test is not yet approved or cleared by the [place] FDA and  has been authorized for detection and/or diagnosis of SARS-CoV-2 by  FDA under an Emergency Use Authorization (EUA). This EUA will remain  in effect (meaning this test can be used) for the duration of the  Covid-19 declaration under Section 564(b)(1) of the Act, 21  U.S.C. section 360bbb-3(b)(1), unless the authorization is  terminated or revoked. Performed at [facility], [address] [place], [place] [address]   MRSA PCR Screening     Status: None   Collection Time: 05/13/19 12:04 AM   Specimen: Nasopharyngeal  Result Value Ref Range Status   MRSA by PCR NEGATIVE NEGATIVE Final    Comment:        The GeneXpert MRSA Assay (FDA approved for NASAL specimens only), is one component of a comprehensive MRSA colonization surveillance program. It is not intended to diagnose MRSA infection nor to guide or monitor treatment for MRSA infections. Performed at [facility], [address] [place], [place] [address]     Coagulation Studies: Recent Labs    05/12/19 1809  LABPROT 14.0  INR 1.1    Urinalysis: No results for input(s): COLORURINE, LABSPEC, PHURINE, GLUCOSEU, HGBUR, BILIRUBINUR, KETONESUR, PROTEINUR, UROBILINOGEN, NITRITE, LEUKOCYTESUR in the last 72 hours.  Invalid input(s): APPERANCEUR    Imaging: MR CERVICAL SPINE WO CONTRAST  Result Date: 05/13/2019 CLINICAL DATA:  Darrick Grinder initial evaluation for acute sepsis, concern for discitis. EXAM: MRI CERVICAL SPINE WITHOUT CONTRAST TECHNIQUE: Multiplanar, multisequence MR imaging of the cervical spine was performed. No intravenous contrast was administered. COMPARISON:  None available. FINDINGS: Alignment: Straightening of the normal cervical lordosis. No listhesis or subluxation. Vertebrae: Vertebral body height maintained without evidence for acute or chronic fracture. Bone  marrow signal intensity within normal limits. No discrete or worrisome osseous lesions. Discogenic reactive endplate changes present about the C3-4 interspace. No other abnormal marrow edema. No findings to suggest osteomyelitis discitis or septic arthritis. Cord: Signal intensity within the cervical spinal cord is normal. No epidural collections identified. Posterior Fossa, vertebral arteries, paraspinal tissues: Note made of a empty sella. Visualized brain and posterior fossa otherwise unremarkable. Sphenoid sinus disease partially visualized. Craniocervical junction normal. Paraspinous and prevertebral soft tissues within normal limits. Normal flow voids seen within the vertebral arteries bilaterally. Disc levels: C2-C3: Mild uncovertebral hypertrophy without significant disc bulge. No canal or foraminal stenosis. C3-C4: Chronic intervertebral disc space narrowing with diffuse degenerative disc osteophyte. Flattening and effacement of the ventral thecal sac with resultant moderate spinal stenosis. Mild cord flattening without cord signal changes. Severe bilateral C4 foraminal stenosis. C4-C5: Mild disc bulge with uncovertebral hypertrophy. No significant spinal stenosis. Mild bilateral C5 foraminal narrowing. C5-C6: Mild disc bulge with uncovertebral spurring. No significant spinal stenosis. Mild left C6 foraminal stenosis. No significant right  foraminal narrowing. C6-C7: Normal interspace. Mild facet degeneration on the right. No canal or foraminal stenosis. C7-T1:  Unremarkable. Visualized upper thoracic spine demonstrates no significant finding. IMPRESSION: 1. No evidence for osteomyelitis discitis or septic arthritis within the cervical spine. No other acute infection identified. 2. Degenerative disc osteophyte at C3-4 with resultant moderate canal with severe bilateral C4 foraminal stenosis. 3. Mild bilateral C5 and left C6 foraminal narrowing related to disc bulge and uncovertebral disease. Electronically  Signed   By: Jeannine Boga M.D.   On: 05/13/2019 03:11   DG Chest Portable 1 View  Result Date: 05/12/2019 CLINICAL DATA:  Sepsis EXAM: PORTABLE CHEST 1 VIEW COMPARISON:  04/17/2018 FINDINGS: There is a well-positioned tunneled dialysis catheter on the right. The heart size is stable. There are few hazy airspace opacities in the lung bases bilaterally favored to represent areas of atelectasis. There is no pneumothorax. No pleural effusion. There is mild vascular congestion without overt pulmonary edema. There is no acute osseous abnormality. IMPRESSION: No active disease. Electronically Signed   By: Constance Holster M.D.   On: 05/12/2019 18:54     Medications:   . ceFEPime (MAXIPIME) IV    . [START ON 05/14/2019] vancomycin     . Chlorhexidine Gluconate Cloth  6 each Topical Daily  . Chlorhexidine Gluconate Cloth  6 each Topical Q0600  . heparin  5,000 Units Subcutaneous Q8H  . magic mouthwash  10 mL Oral QID  . senna-docusate  2 tablet Oral BID   acetaminophen **OR** acetaminophen, calcium carbonate, camphor-menthol **AND** hydrOXYzine, feeding supplement (NEPRO CARB STEADY), ondansetron **OR** ondansetron (ZOFRAN) IV, sorbitol  Assessment/ Plan:  64 y.o. male with past medical history of COPD, diabetes mellitus type 2, BPH, hypertension, hyperlipidemia, ESRD on HD TTS, anemia of chronic kidney disease, secondary hyperparathyroidism who was admitted with fever and suspected sepsis.  [facility] [address]/TTS  1.  ESRD on HD TTS.  Patient normally dialyzes on TTS schedule.  However he has a suspected infected PermCath now as he came in with fever and does have coagulase negative Staph aureus growing.  Therefore we recommend PermCath removal and await further culture data.  In planning for this we will go ahead and dialyze the patient for short treatment today and then plan for catheter free period of 2 days as possible.  2.  Fever with suspected sepsis.  PermCath may be the  potential source of infection.  Has been given cefepime and vancomycin.  Consulted with vascular surgery to remove PermCath after dialysis treatment today.  Appreciate their assistance.  3.  Anemia of chronic kidney disease.  Hemoglobin 1.7.  Hold off on Epogen for now.  4.  Secondary hyperparathyroidism.  Check serum phosphorus today.   LOS: 1 Prabhav Faulkenberry 5/10/20211:30 PM

## 2019-05-13 NOTE — Consult Note (Signed)
[facility] Admission History & Physical  MRN : [id number]  Daniel Thomas is a 64 y.o. ([birth year]) male who presents with chief complaint of  Chief Complaint  Patient presents with  . Neck Pain  . Shortness of Breath  . Fever   History of Present Illness:  I am asked to evaluate the patient by his nephrologist.  Patient recently medical attention in our emergency department with a chief complaint of fever worsening shortness of breath.  Patient found to be febrile to 103 in the emergency department.  He was tachycardic mildly tachypneic.  Denies any nausea, vomiting or diarrhea.  Patient has a known history of end-stage renal disease currently maintained on hemodialysis (Tuesday, Thursday, Saturday).  He is currently maintained by a right IJ PermCath.  Denies any issues with the functioning of his PermCath at this time.  Patient with positive blood cultures for Staphylococcus which is coagulase-negative.   Vascular surgery was consulted by Dr. Holley Raring for PermCath removal.  Current Facility-Administered Medications  Medication Dose Route Frequency Provider Last Rate Last Admin  . acetaminophen (TYLENOL) tablet 650 mg  650 mg Oral Q6H PRN Elwyn Reach, MD   650 mg at 05/13/19 1509   Or  . acetaminophen (TYLENOL) suppository 650 mg  650 mg Rectal Q6H PRN Elwyn Reach, MD      . calcium carbonate (TUMS - dosed in mg elemental calcium) chewable tablet 500 mg of elemental calcium  500 mg of elemental calcium Oral Q6H PRN Elwyn Reach, MD      . camphor-menthol (SARNA) lotion 1 application  1 application Topical J6R PRN Elwyn Reach, MD       And  . hydrOXYzine (ATARAX/VISTARIL) tablet 25 mg  25 mg Oral Q8H PRN Jonelle Sidle, Mohammad L, MD      . ceFEPIme (MAXIPIME) 1 g in sodium chloride 0.9 % 100 mL IVPB  1 g Intravenous Q24H Sharen Hones, MD      . Chlorhexidine Gluconate Cloth 2 % PADS 6 each  6 each Topical Daily Sharen Hones, MD   Stopped at  05/13/19 1508  . Chlorhexidine Gluconate Cloth 2 % PADS 6 each  6 each Topical Q0600 Lateef, [place], MD      . feeding supplement (NEPRO CARB STEADY) liquid 237 mL  237 mL Oral TID PRN Elwyn Reach, MD      . heparin injection 5,000 Units  5,000 Units Subcutaneous Q8H Elwyn Reach, MD   Stopped at 05/13/19 1508  . magic mouthwash  10 mL Oral QID Sharion Settler, NP   Stopped at 05/13/19 1508  . ondansetron (ZOFRAN) tablet 4 mg  4 mg Oral Q6H PRN Elwyn Reach, MD       Or  . ondansetron (ZOFRAN) injection 4 mg  4 mg Intravenous Q6H PRN Elwyn Reach, MD   4 mg at 05/13/19 0903  . senna-docusate (Senokot-S) tablet 2 tablet  2 tablet Oral BID Sharen Hones, MD   Stopped at 05/13/19 1223  . sorbitol 70 % solution 30 mL  30 mL Oral PRN Elwyn Reach, MD      . Derrill Memo ON 05/14/2019] vancomycin (VANCOCIN) IVPB 1000 mg/200 mL premix  1,000 mg Intravenous Q T,Th,Sa-HD Jonelle Sidle, Mohammad L, MD      . vancomycin (VANCOCIN) IVPB 1000 mg/200 mL premix  1,000 mg Intravenous Once Dallie Piles, [facility]       Past Medical History:  Diagnosis Date  . COPD (chronic  obstructive pulmonary disease) ([place])   . Diabetes mellitus without complication ([place])   . Enlarged prostate   . ESRD on dialysis ([place])   . Heart murmur   . High cholesterol   . Hypertension    Past Surgical History:  Procedure Laterality Date  . DIALYSIS/PERMA CATHETER INSERTION N/A 01/12/2018   Procedure: DIALYSIS/PERMA CATHETER INSERTION;  Surgeon: Katha Cabal, MD;  Location: [facility];  Service: Cardiovascular;  Laterality: N/A;   Social History Social History   Tobacco Use  . Smoking status: Former Smoker    Years: 20.00  . Smokeless tobacco: Never Used  Substance Use Topics  . Alcohol use: Never  . Drug use: Never   Family History History reviewed. No pertinent family history.   No family history of bleeding or clotting disorders, autoimmune disease or porphyria  No Known Allergies  REVIEW  OF SYSTEMS (Negative unless checked)  Constitutional: [] Weight loss  [] Fever  [] Chills Cardiac: [] Chest pain   [] Chest pressure   [] Palpitations   [] Shortness of breath when laying flat   [] Shortness of breath at rest   [x] Shortness of breath with exertion. Vascular:  [] Pain in legs with walking   [] Pain in legs at rest   [] Pain in legs when laying flat   [] Claudication   [] Pain in feet when walking  [] Pain in feet at rest  [] Pain in feet when laying flat   [] History of DVT   [] Phlebitis   [] Swelling in legs   [] Varicose veins   [] Non-healing ulcers Pulmonary:   [] Uses home oxygen   [] Productive cough   [] Hemoptysis   [] Wheeze  [] COPD   [] Asthma Neurologic:  [] Dizziness  [] Blackouts   [] Seizures   [] History of stroke   [] History of TIA  [] Aphasia   [] Temporary blindness   [] Dysphagia   [] Weakness or numbness in arms   [] Weakness or numbness in legs Musculoskeletal:  [] Arthritis   [] Joint swelling   [] Joint pain   [] Low back pain Hematologic:  [] Easy bruising  [] Easy bleeding   [] Hypercoagulable state   [] Anemic  [] Hepatitis Gastrointestinal:  [] Blood in stool   [] Vomiting blood  [] Gastroesophageal reflux/heartburn   [] Difficulty swallowing. Genitourinary:  [x] Chronic kidney disease   [] Difficult urination  [] Frequent urination  [] Burning with urination   [] Blood in urine Skin:  [] Rashes   [] Ulcers   [] Wounds Psychological:  [] History of anxiety   []  History of major depression.  Physical Examination  Vitals:   05/13/19 1400 05/13/19 1415 05/13/19 1430 05/13/19 1450  BP: 137/72 (!) 146/76 (!) 148/80 (!) 156/81  Pulse: 88 87 88 90  Resp: 19 (!) 22 (!) 21 17  Temp:    98.7 F (37.1 C)  TempSrc:    Oral  SpO2:    100%  Weight:      Height:       Body mass index is 32.55 kg/m. Gen: WD/WN, NAD Head: [place]/AT, No temporalis wasting. Prominent temp pulse not noted. Ear/Nose/Throat: Hearing grossly intact, nares w/o erythema or drainage, oropharynx w/o Erythema/Exudate,  Eyes: Conjunctiva clear,  sclera non-icteric Neck: Trachea midline.  No JVD.  Pulmonary:  Good air movement, respirations not labored, no use of accessory muscles.  Cardiac: RRR, normal S1, S2. Vascular:  Vessel Right Left  Radial Palpable Palpable  Ulnar Not Palpable Not Palpable  Brachial Palpable Palpable  Carotid Palpable, without bruit Palpable, without bruit   Right Permcath:  Intact, clean and dry. No signs if infection.   Gastrointestinal: soft, non-tender/non-distended. No guarding/reflex.  Musculoskeletal:  M/S 5/5 throughout.  Extremities without ischemic changes.  No deformity or atrophy.  Neurologic: Sensation grossly intact in extremities.  Symmetrical.  Speech is fluent. Motor exam as listed above. Psychiatric: Judgment intact, Mood & affect appropriate for pt's clinical situation. Dermatologic: No rashes or ulcers noted.  No cellulitis or open wounds. Lymph : No Cervical, Axillary, or Inguinal lymphadenopathy.  CBC Lab Results  Component Value Date   WBC 27.4 (H) 05/13/2019   HGB 10.7 (L) 05/13/2019   HCT 30.6 (L) 05/13/2019   MCV 86.9 05/13/2019   PLT 273 05/13/2019   BMET    Component Value Date/Time   NA 133 (L) 05/13/2019 0547   K 3.5 05/13/2019 0547   CL 93 (L) 05/13/2019 0547   CO2 25 05/13/2019 0547   GLUCOSE 121 (H) 05/13/2019 0547   BUN 57 (H) 05/13/2019 0547   CREATININE 8.12 (H) 05/13/2019 0547   CALCIUM 9.2 05/13/2019 0547   GFRNONAA 6 (L) 05/13/2019 0547   GFRAA 7 (L) 05/13/2019 0547   Estimated Creatinine Clearance: 7.6 mL/min (A) (by C-G formula based on SCr of 8.12 mg/dL (H)).  COAG Lab Results  Component Value Date   INR 1.1 05/12/2019   Radiology MR CERVICAL SPINE WO CONTRAST  Result Date: 05/13/2019 CLINICAL DATA:  Darrick Grinder initial evaluation for acute sepsis, concern for discitis. EXAM: MRI CERVICAL SPINE WITHOUT CONTRAST TECHNIQUE: Multiplanar, multisequence MR imaging of the cervical spine was performed. No intravenous contrast was administered.  COMPARISON:  None available. FINDINGS: Alignment: Straightening of the normal cervical lordosis. No listhesis or subluxation. Vertebrae: Vertebral body height maintained without evidence for acute or chronic fracture. Bone marrow signal intensity within normal limits. No discrete or worrisome osseous lesions. Discogenic reactive endplate changes present about the C3-4 interspace. No other abnormal marrow edema. No findings to suggest osteomyelitis discitis or septic arthritis. Cord: Signal intensity within the cervical spinal cord is normal. No epidural collections identified. Posterior Fossa, vertebral arteries, paraspinal tissues: Note made of a empty sella. Visualized brain and posterior fossa otherwise unremarkable. Sphenoid sinus disease partially visualized. Craniocervical junction normal. Paraspinous and prevertebral soft tissues within normal limits. Normal flow voids seen within the vertebral arteries bilaterally. Disc levels: C2-C3: Mild uncovertebral hypertrophy without significant disc bulge. No canal or foraminal stenosis. C3-C4: Chronic intervertebral disc space narrowing with diffuse degenerative disc osteophyte. Flattening and effacement of the ventral thecal sac with resultant moderate spinal stenosis. Mild cord flattening without cord signal changes. Severe bilateral C4 foraminal stenosis. C4-C5: Mild disc bulge with uncovertebral hypertrophy. No significant spinal stenosis. Mild bilateral C5 foraminal narrowing. C5-C6: Mild disc bulge with uncovertebral spurring. No significant spinal stenosis. Mild left C6 foraminal stenosis. No significant right foraminal narrowing. C6-C7: Normal interspace. Mild facet degeneration on the right. No canal or foraminal stenosis. C7-T1:  Unremarkable. Visualized upper thoracic spine demonstrates no significant finding. IMPRESSION: 1. No evidence for osteomyelitis discitis or septic arthritis within the cervical spine. No other acute infection identified. 2.  Degenerative disc osteophyte at C3-4 with resultant moderate canal with severe bilateral C4 foraminal stenosis. 3. Mild bilateral C5 and left C6 foraminal narrowing related to disc bulge and uncovertebral disease. Electronically Signed   By: Jeannine Boga M.D.   On: 05/13/2019 03:11   DG Chest Portable 1 View  Result Date: 05/12/2019 CLINICAL DATA:  Sepsis EXAM: PORTABLE CHEST 1 VIEW COMPARISON:  04/17/2018 FINDINGS: There is a well-positioned tunneled dialysis catheter on the right. The heart size is stable. There are few hazy airspace opacities in the  lung bases bilaterally favored to represent areas of atelectasis. There is no pneumothorax. No pleural effusion. There is mild vascular congestion without overt pulmonary edema. There is no acute osseous abnormality. IMPRESSION: No active disease. Electronically Signed   By: Constance Holster M.D.   On: 05/12/2019 18:54   Assessment/Plan I am asked to evaluate the patient by his nephrologist.  Patient recently sought medical attention in our emergency department with a chief complaint of fever worsening shortness of breath admitted and found to have positive blood cultures / sepsis.  1.  Sepsis:   Patient with positive blood cultures.  Recommend removal of his right IJ PermCath as this may be a the source.  Procedure, risks and benefits explained to the patient.  All questions answered.  The patient wishes to move forward.  Recommend patient be catheter free for at least 24 to 48 hours or until afebrile.   2.  End-stage renal disease requiring hemodialysis:   Once patient has been made afebrile for 24 to 48 hours recommend placing a PermCath.  3.  Hypertension:  Patient will continue medical management; nephrology is following no changes in oral medications.  4. Diabetes mellitus:  Glucose will be monitored and oral medications been held this morning once the patient has undergone the patient's procedure po intake will be reinitiated and again  Accu-Cheks will be used to assess the blood glucose level and treat as needed. The patient will be restarted on the patient's usual hypoglycemic regime  Discussed with Dr. Mayme Genta, PA-C  05/13/2019 4:34 PM

## 2019-05-13 NOTE — Progress Notes (Signed)
Pharmacy Antibiotic Note  Daniel Thomas is a 64 y.o. male admitted on 05/12/2019 with sepsis.  Pharmacy has been consulted for vanc/cefepime dosing.  Plan: Patient received vanc 1g, cefepime 2g, flagyl 500 mg IV x 1 in ED  Patient has ESRD on HD TThSa  Will continue cefepime 1g IV q24h and vanc 1g IV qTThSa w/ HD. Will check pre-HD level 05/15 w/ am labs. Goal pre-HD level < 20 - 25 mcg/mL.  Will continue to monitor and adjust doses as necessary.   Height: 4\' 11"  (149.9 cm) Weight: 76.2 kg (168 lb) IBW/kg (Calculated) : 47.7  Temp (24hrs), Avg:99.8 F (37.7 C), Min:98 F (36.7 C), Max:103 F (39.4 C)  Recent Labs  Lab 05/12/19 1809  WBC 21.4*  CREATININE 7.23*  LATICACIDVEN 1.0    Estimated Creatinine Clearance: 8.7 mL/min (A) (by C-G formula based on SCr of 7.23 mg/dL (H)).    No Known Allergies   Thank you for allowing pharmacy to be a part of this patient's care.  Tobie Lords, PharmD, BCPS Clinical Pharmacist 05/13/2019 3:25 AM

## 2019-05-13 NOTE — Op Note (Signed)
Operative Note  Preoperative diagnosis:   1. ESRD with functional permanent access  Postoperative diagnosis:  1. ESRD with functional permanent access  Procedure:  Removal of Right Permcath  Physician Assistant: Hezzie Bump PA-C  Supervising Surgeon:  Leotis Pain, MD  Anesthesia:  Local  EBL:  Minimal  Indication for the Procedure:  The patient has a functional permanent dialysis access and no longer needs their permcath.  This can be removed.  Risks and benefits are discussed and informed consent is obtained.  Description of the Procedure:  The patient's right neck, chest and existing catheter were sterilely prepped and draped. The area around the catheter was anesthetized copiously with 1% lidocaine. The catheter was dissected out with curved hemostats until the cuff was freed from the surrounding fibrous sheath. The fiber sheath was transected, and the catheter was then removed in its entirety using gentle traction. Pressure was held and sterile dressings were placed. The patient tolerated the procedure well and was taken to the recovery room in stable condition.  [place]  05/13/2019, 4:43 PM  This note was created with Dragon Medical transcription system. Any errors in dictation are purely unintentional.

## 2019-05-13 NOTE — Progress Notes (Signed)
   05/13/19 1450  Hand-Off documentation  Handoff Given Given to shift RN/LPN  Report given to (Full Name) Hilbert Odor  Handoff Received Received from shift RN/LPN  Report received from (Full Name) Rodric Punch H  Vital Signs  Temp 98.7 F (37.1 C)  Temp Source Oral  Pulse Rate 90  Resp 17  BP (!) 156/81  BP Location Left Arm  BP Method Automatic  Patient Position (if appropriate) Lying  Oxygen Therapy  SpO2 100 %  O2 Device Room Air  Pain Assessment  Pain Scale 0-10  Pain Score 5  Pain Type Acute pain  Pain Location Head  Pain Radiating Towards neck  Pain Intervention(s) Medication (See eMAR)  During Hemodialysis Assessment  Blood Flow Rate (mL/min) 400 mL/min  Arterial Pressure (mmHg) -200 mmHg  Venous Pressure (mmHg) 150 mmHg  Transmembrane Pressure (mmHg) 40 mmHg  Ultrafiltration Rate (mL/min) 800 mL/min  Dialysate Flow Rate (mL/min) 800 ml/min  Conductivity: Machine  15.6  HD Safety Checks Performed Yes  KECN 53.2 KECN  Dialysis Fluid Bolus Normal Saline  Bolus Amount (mL) 250 mL  Intra-Hemodialysis Comments Tolerated well;Tx completed  Post-Hemodialysis Assessment  Rinseback Volume (mL) 250 mL  KECN 53.2 V  Dialyzer Clearance Lightly streaked  Duration of HD Treatment -hour(s) 2.5 hour(s)  Hemodialysis Intake (mL) 500 mL  UF Total -Machine (mL) 1662 mL  Net UF (mL) 1162 mL  Tolerated HD Treatment Yes  Post-Hemodialysis Comments tolerated well  Education / Care Plan  Dialysis Education Provided Yes  Documented Education in Care Plan Yes  Treatment completed tolerated well

## 2019-05-14 ENCOUNTER — Encounter: Payer: Self-pay | Admitting: Cardiology

## 2019-05-14 DIAGNOSIS — B957 Other staphylococcus as the cause of diseases classified elsewhere: Secondary | ICD-10-CM

## 2019-05-14 DIAGNOSIS — R7881 Bacteremia: Secondary | ICD-10-CM

## 2019-05-14 DIAGNOSIS — A411 Sepsis due to other specified staphylococcus: Secondary | ICD-10-CM

## 2019-05-14 LAB — BASIC METABOLIC PANEL
Anion gap: 14 (ref 5–15)
BUN: 40 mg/dL — ABNORMAL HIGH (ref 8–23)
CO2: 25 mmol/L (ref 22–32)
Calcium: 9 mg/dL (ref 8.9–10.3)
Chloride: 93 mmol/L — ABNORMAL LOW (ref 98–111)
Creatinine, Ser: 6.32 mg/dL — ABNORMAL HIGH (ref 0.61–1.24)
GFR calc Af Amer: 10 mL/min — ABNORMAL LOW (ref >60)
GFR calc non Af Amer: 9 mL/min — ABNORMAL LOW (ref >60)
Glucose, Bld: 109 mg/dL — ABNORMAL HIGH (ref 70–99)
Potassium: 3.6 mmol/L (ref 3.5–5.1)
Sodium: 132 mmol/L — ABNORMAL LOW (ref 135–145)

## 2019-05-14 LAB — CBC WITH DIFFERENTIAL/PLATELET
Abs Immature Granulocytes: 0.07 10*3/uL (ref 0.00–0.07)
Basophils Absolute: 0.1 10*3/uL (ref 0.0–0.1)
Basophils Relative: 0 %
Eosinophils Absolute: 0.2 10*3/uL (ref 0.0–0.5)
Eosinophils Relative: 1 %
HCT: 31.5 % — ABNORMAL LOW (ref 39.0–52.0)
Hemoglobin: 10.7 g/dL — ABNORMAL LOW (ref 13.0–17.0)
Immature Granulocytes: 1 %
Lymphocytes Relative: 10 %
Lymphs Abs: 1.6 10*3/uL (ref 0.7–4.0)
MCH: 30.1 pg (ref 26.0–34.0)
MCHC: 34 g/dL (ref 30.0–36.0)
MCV: 88.5 fL (ref 80.0–100.0)
Monocytes Absolute: 1.7 10*3/uL — ABNORMAL HIGH (ref 0.1–1.0)
Monocytes Relative: 11 %
Neutro Abs: 11.6 10*3/uL — ABNORMAL HIGH (ref 1.7–7.7)
Neutrophils Relative %: 77 %
Platelets: 263 10*3/uL (ref 150–400)
RBC: 3.56 MIL/uL — ABNORMAL LOW (ref 4.22–5.81)
RDW: 14.4 % (ref 11.5–15.5)
WBC: 15.1 10*3/uL — ABNORMAL HIGH (ref 4.0–10.5)
nRBC: 0 % (ref 0.0–0.2)

## 2019-05-14 LAB — HIV ANTIBODY (ROUTINE TESTING W REFLEX): HIV Screen 4th Generation wRfx: NONREACTIVE

## 2019-05-14 MED ORDER — CEFAZOLIN SODIUM-DEXTROSE 1-4 GM/50ML-% IV SOLN
1.0000 g | INTRAVENOUS | Status: DC
  Administered 2019-05-14 – 2019-05-15 (×2): 1 g via INTRAVENOUS
  Filled 2019-05-14 (×4): qty 50

## 2019-05-14 MED ORDER — POLYETHYLENE GLYCOL 3350 17 G PO PACK
17.0000 g | PACK | Freq: Once | ORAL | Status: AC
  Administered 2019-05-14: 17 g via ORAL
  Filled 2019-05-14: qty 1

## 2019-05-14 NOTE — Progress Notes (Signed)
Will plan tee tomorrow at 9:00

## 2019-05-14 NOTE — Consult Note (Signed)
NAME: Daniel Thomas  DOB: November 11, 1955  MRN: [id number]  Date/Time: 05/14/2019 12:29 PM  REQUESTING PROVIDER Zhang Subjective:  REASON FOR CONSULT: Bacteremia ? Daniel Thomas is a 64 y.o. male with a history of end-stage renal disease on dialysis through permacath, COPD, diabetes mellitus, hypertension presented from home on 05/12/2019 with shortness of breath and chills for a week.  He was also complaining of neck pain.  He has had pain in his neck for almost a month.  His fever started a week ago.  In the ED his temperature was 103.  BP 170/75, heart rate of 118 and respiratory rate of 22.  Labs revealed a WBC of 21.4 which increased to 27.4 the next day.  Hemoglobin 11.7, platelet 316 and glucose of 120.  MRI of the cervical spine revealed degenerative disc osteophytes at C3-C4 .  Blood cultures were sent and he was started on vancomycin and metronidazole and cefepime. Blood culture came back as coag negative staph 4 out of 4 bottle and he has been started on cefazolin.  Other antibiotics have been stopped and I am asked to see the patient for the same.  He has been on dialysis for the past year.  He has a right IJ permacath. He states he has been having itching around the site of the catheter. Past Medical History:  Diagnosis Date   COPD (chronic obstructive pulmonary disease) ([place])    Diabetes mellitus without complication ([place])    Enlarged prostate    ESRD on dialysis ([place])    Heart murmur    High cholesterol    Hypertension     Past Surgical History:  Procedure Laterality Date   DIALYSIS/PERMA CATHETER INSERTION N/A 01/12/2018   Procedure: DIALYSIS/PERMA CATHETER INSERTION;  Surgeon: Katha Cabal, MD;  Location: [facility];  Service: Cardiovascular;  Laterality: N/A;   DIALYSIS/PERMA CATHETER REMOVAL N/A 05/13/2019   Procedure: DIALYSIS/PERMA CATHETER REMOVAL;  Surgeon: Algernon Huxley, MD;  Location: [facility];  Service: Cardiovascular;  Laterality: N/A;     Social History   Socioeconomic History   Marital status: Single    Spouse name: Not on file   Number of children: Not on file   Years of education: Not on file   Highest education level: Not on file  Occupational History   Not on file  Tobacco Use   Smoking status: Former Smoker    Years: 20.00   Smokeless tobacco: Never Used  Substance and Sexual Activity   Alcohol use: Never   Drug use: Never   Sexual activity: Not Currently  Other Topics Concern   Not on file  Social History Narrative   Lives home alone, 3 aunts that live next door and brother 1 mile away. Helps commute to appointments.   Social Determinants of Health   Financial Resource Strain:    Difficulty of Paying Living Expenses:   Food Insecurity:    Worried About Charity fundraiser in the Last Year:    Arboriculturist in the Last Year:   Transportation Needs:    Film/video editor (Medical):    Lack of Transportation (Non-Medical):   Physical Activity:    Days of Exercise per Week:    Minutes of Exercise per Session:   Stress:    Feeling of Stress :   Social Connections:    Frequency of Communication with Friends and Family:    Frequency of Social Gatherings with Friends and Family:  Attends Religious Services:    Active Member of Clubs or Organizations:    Attends Music therapist:    Marital Status:   Intimate Partner Violence:    Fear of Current or Ex-Partner:    Emotionally Abused:    Physically Abused:    Sexually Abused:     No Known Allergies Family history Hypertension mother Diabetes mellitus his mother ? Current Facility-Administered Medications  Medication Dose Route Frequency Provider Last Rate Last Admin   0.9 %  sodium chloride infusion   Intravenous PRN Sharion Settler, NP 10 mL/hr at 05/13/19 2000 Rate Verify at 05/13/19 2000   acetaminophen (TYLENOL) tablet 650 mg  650 mg Oral Q6H PRN Sharion Settler, NP   650 mg at  05/13/19 1509   Or   acetaminophen (TYLENOL) suppository 650 mg  650 mg Rectal Q6H PRN Sharion Settler, NP       amLODipine (NORVASC) tablet 10 mg  10 mg Oral Daily Sharion Settler, NP   10 mg at 05/14/19 1034   aspirin-acetaminophen-caffeine ([place]) per tablet 1 tablet  1 tablet Oral Q8H PRN Sharion Settler, NP   1 tablet at 05/14/19 0602   calcium carbonate (TUMS - dosed in mg elemental calcium) chewable tablet 500 mg of elemental calcium  500 mg of elemental calcium Oral Q6H PRN Sharion Settler, NP       camphor-menthol [facility]) lotion 1 application  1 application Topical N3Z PRN Sharion Settler, NP       And   hydrOXYzine (ATARAX/VISTARIL) tablet 25 mg  25 mg Oral Q8H PRN Sharion Settler, NP       carvedilol (COREG) tablet 12.5 mg  12.5 mg Oral BID WC Sharion Settler, NP   12.5 mg at 05/14/19 1034   ceFAZolin (ANCEF) IVPB 1 g/50 mL premix  1 g Intravenous Q24H Sharen Hones, MD       Chlorhexidine Gluconate Cloth 2 % PADS 6 each  6 each Topical Daily Sharion Settler, NP   Stopped at 05/13/19 1508   Chlorhexidine Gluconate Cloth 2 % PADS 6 each  6 each Topical Q0600 Sharion Settler, NP       citalopram (CELEXA) tablet 20 mg  20 mg Oral Daily Sharion Settler, NP   20 mg at 05/14/19 1034   feeding supplement (NEPRO CARB STEADY) liquid 237 mL  237 mL Oral TID PRN Sharion Settler, NP       ferric citrate (AURYXIA) tablet 210 mg  210 mg Oral TID WC Sharion Settler, NP   210 mg at 05/14/19 1033   heparin injection 5,000 Units  5,000 Units Subcutaneous Q8H Sharion Settler, NP   5,000 Units at 05/14/19 0602   hydrALAZINE (APRESOLINE) tablet 25 mg  25 mg Oral Q8H Sharion Settler, NP   25 mg at 05/14/19 0602   linagliptin (TRADJENTA) tablet 5 mg  5 mg Oral Daily Sharion Settler, NP   5 mg at 05/14/19 1034   magic mouthwash  10 mL Oral QID Sharion Settler, NP   10 mL at 05/14/19 1033   ondansetron (ZOFRAN) tablet 4 mg  4 mg Oral Q6H PRN Sharion Settler, NP        Or   ondansetron [facility]) injection 4 mg  4 mg Intravenous Q6H PRN Sharion Settler, NP   4 mg at 05/13/19 0903   polyethylene glycol (MIRALAX / GLYCOLAX) packet 17 g  17 g Oral Once Sharen Hones, MD       pravastatin (PRAVACHOL) tablet 40 mg  40 mg  Oral q1800 Sharion Settler, NP       senna-docusate (Senokot-S) tablet 2 tablet  2 tablet Oral BID Sharion Settler, NP   2 tablet at 05/14/19 1034   sorbitol 70 % solution 30 mL  30 mL Oral PRN Sharion Settler, NP       traZODone (DESYREL) tablet 25 mg  25 mg Oral QHS PRN Sharion Settler, NP         Abtx:  Anti-infectives (From admission, onward)   Start     Dose/Rate Route Frequency Ordered Stop   05/14/19 1800  vancomycin (VANCOCIN) IVPB 1000 mg/200 mL premix  Status:  Discontinued     1,000 mg 200 mL/hr over 60 Minutes Intravenous Every T-Th-Sa (Hemodialysis) 05/13/19 0324 05/14/19 1032   05/14/19 1800  ceFAZolin (ANCEF) IVPB 1 g/50 mL premix     1 g 100 mL/hr over 30 Minutes Intravenous Every 24 hours 05/14/19 1032     05/13/19 1830  ceFEPIme (MAXIPIME) 1 g in sodium chloride 0.9 % 100 mL IVPB  Status:  Discontinued     1 g 200 mL/hr over 30 Minutes Intravenous Every 24 hours 05/13/19 0322 05/14/19 1032   05/13/19 1800  vancomycin (VANCOCIN) IVPB 1000 mg/200 mL premix     1,000 mg 200 mL/hr over 60 Minutes Intravenous  Once 05/13/19 1336 05/13/19 2211   05/13/19 0449  metroNIDAZOLE (FLAGYL) IVPB 500 mg  Status:  Discontinued     500 mg 100 mL/hr over 60 Minutes Intravenous Every 8 hours 05/13/19 0449 05/13/19 1216   05/13/19 0330  metroNIDAZOLE (FLAGYL) IVPB 500 mg  Status:  Discontinued     500 mg 100 mL/hr over 60 Minutes Intravenous Every 8 hours 05/13/19 0323 05/13/19 0449   05/12/19 1815  ceFEPIme (MAXIPIME) 2 g in sodium chloride 0.9 % 100 mL IVPB     2 g 200 mL/hr over 30 Minutes Intravenous  Once 05/12/19 1811 05/12/19 1924   05/12/19 1815  metroNIDAZOLE (FLAGYL) IVPB 500 mg     500 mg 100 mL/hr over 60 Minutes  Intravenous  Once 05/12/19 1811 05/12/19 2012   05/12/19 1815  vancomycin (VANCOCIN) IVPB 1000 mg/200 mL premix     1,000 mg 200 mL/hr over 60 Minutes Intravenous  Once 05/12/19 1811 05/12/19 2025      REVIEW OF SYSTEMS:  Const:  fever,  chills, negative weight loss Eyes: negative diplopia or visual changes, negative eye pain ENT: negative coryza, negative sore throat Resp: negative cough, hemoptysis, dyspnea Cards: negative for chest pain, palpitations, lower extremity edema GU: negative for frequency, dysuria and hematuria GI: Negative for abdominal pain, diarrhea, bleeding, constipation Skin: negative for rash and pruritus Heme: negative for easy bruising and gum/nose bleeding MS: Generalized weakness Neurolo:negative for headaches, dizziness, vertigo, memory problems  Psych: negative for feelings of anxiety, depression  Endocrine: negative for thyroid, diabetes Allergy/Immunology- negative for any medication or food allergies  Objective:  VITALS:  BP (!) 165/82 (BP Location: Left Arm)    Pulse 79    Temp 98.5 F (36.9 C) (Oral)    Resp 14    Ht 4\' 11"  (1.499 m)    Wt 73.1 kg    SpO2 100%    BMI 32.55 kg/m  PHYSICAL EXAM:  General: Alert, cooperative, no distress, appears stated age.  Head: Normocephalic, without obvious abnormality, atraumatic. Eyes: Conjunctivae clear, anicteric sclerae. Pupils are equal ENT Nares normal. No drainage or sinus tenderness. Lips, mucosa, and tongue normal. No Thrush Neck: Supple, symmetrical, no adenopathy, thyroid:  non tender no carotid bruit and no JVD. Back: No CVA tenderness. Lungs: Clear to auscultation bilaterally. No Wheezing or Rhonchi. No rales. Heart: Regular rate and rhythm, no murmur, rub or gallop. Chest wall in the right side the catheter has been removed.  The skin around the catheter site is      Abdomen: Soft, non-tender,not distended. Bowel sounds normal. No masses Extremities: atraumatic, no cyanosis. No edema. No  clubbing Skin: No rashes or lesions. Or bruising Lymph: Cervical, supraclavicular normal. Neurologic: Grossly non-focal Pertinent Labs Lab Results CBC    Component Value Date/Time   WBC 15.1 (H) 05/14/2019 0519   RBC 3.56 (L) 05/14/2019 0519   HGB 10.7 (L) 05/14/2019 0519   HCT 31.5 (L) 05/14/2019 0519   PLT 263 05/14/2019 0519   MCV 88.5 05/14/2019 0519   MCH 30.1 05/14/2019 0519   MCHC 34.0 05/14/2019 0519   RDW 14.4 05/14/2019 0519   LYMPHSABS 1.6 05/14/2019 0519   MONOABS 1.7 (H) 05/14/2019 0519   EOSABS 0.2 05/14/2019 0519   BASOSABS 0.1 05/14/2019 0519    CMP Latest Ref Rng & Units 05/14/2019 05/13/2019 05/12/2019  Glucose 70 - 99 mg/dL 109(H) 121(H) 120(H)  BUN 8 - 23 mg/dL 40(H) 57(H) 52(H)  Creatinine 0.61 - 1.24 mg/dL 6.32(H) 8.12(H) 7.23(H)  Sodium 135 - 145 mmol/L 132(L) 133(L) 133(L)  Potassium 3.5 - 5.1 mmol/L 3.6 3.5 3.6  Chloride 98 - 111 mmol/L 93(L) 93(L) 94(L)  CO2 22 - 32 mmol/L 25 25 21(L)  Calcium 8.9 - 10.3 mg/dL 9.0 9.2 9.4  Total Protein 6.5 - 8.1 g/dL - 7.0 7.5  Total Bilirubin 0.3 - 1.2 mg/dL - 0.7 1.0  Alkaline Phos 38 - 126 U/L - 99 117  AST 15 - 41 U/L - 20 23  ALT 0 - 44 U/L - 20 23      Microbiology: Recent Results (from the past 240 hour(s))  Culture, blood (Routine x 2)     Status: None (Preliminary result)   Collection Time: 05/12/19  6:09 PM   Specimen: BLOOD  Result Value Ref Range Status   Specimen Description BLOOD LEFT FA  Final   Special Requests   Final    BOTTLES DRAWN AEROBIC AND ANAEROBIC Blood Culture adequate volume   Culture  Setup Time   Final    GRAM POSITIVE COCCI IN BOTH AEROBIC AND ANAEROBIC BOTTLES CRITICAL VALUE NOTED.  VALUE IS CONSISTENT WITH PREVIOUSLY REPORTED AND CALLED VALUE. Performed at [facility], [address] [place], [place] [address]    Culture [facility] POSITIVE COCCI  Final   Report Status PENDING  Incomplete  Culture, blood (Routine x 2)     Status: None (Preliminary result)    Collection Time: 05/12/19  6:09 PM   Specimen: BLOOD  Result Value Ref Range Status   Specimen Description   Final    BLOOD L HAND Performed at [facility], [address], [place], [place] [address]    Special Requests   Final    BOTTLES DRAWN AEROBIC AND ANAEROBIC Blood Culture adequate volume Performed at [facility], [address], [place], [place] [address]    Culture  Setup Time   Final    GRAM POSITIVE COCCI IN BOTH AEROBIC AND ANAEROBIC BOTTLES CRITICAL RESULT CALLED TO, READ BACK BY AND VERIFIED WITH: CHRISTINE KATSOUDAS AT 1751 ON 05/13/19 SNG Performed at [facility], [place] [address] [place], [place] [address]    Culture GRAM POSITIVE COCCI  Final   Report Status PENDING  Incomplete  Blood Culture ID Panel (Reflexed)     Status: Abnormal   Collection Time: 05/12/19  6:09 PM  Result Value Ref Range Status   Enterococcus species NOT DETECTED NOT DETECTED Final   Listeria monocytogenes NOT DETECTED NOT DETECTED Final   Staphylococcus species DETECTED (A) NOT DETECTED Final    Comment: Methicillin (oxacillin) susceptible coagulase negative staphylococcus. Possible blood culture contaminant (unless isolated from more than one blood culture draw or clinical case suggests pathogenicity). No antibiotic treatment is indicated for blood  culture contaminants. CRITICAL RESULT CALLED TO, READ BACK BY AND VERIFIED WITH: CHRISTINE KATSOUDAS AT 7829 ON 05/13/19 SNG    Staphylococcus aureus (BCID) NOT DETECTED NOT DETECTED Final   Methicillin resistance NOT DETECTED NOT DETECTED Final   Streptococcus species NOT DETECTED NOT DETECTED Final   Streptococcus agalactiae NOT DETECTED NOT DETECTED Final   Streptococcus pneumoniae NOT DETECTED NOT DETECTED Final   Streptococcus pyogenes NOT DETECTED NOT DETECTED Final   Acinetobacter baumannii NOT DETECTED NOT DETECTED Final   Enterobacteriaceae species NOT DETECTED NOT DETECTED Final   Enterobacter cloacae  complex NOT DETECTED NOT DETECTED Final   Escherichia coli NOT DETECTED NOT DETECTED Final   Klebsiella oxytoca NOT DETECTED NOT DETECTED Final   Klebsiella pneumoniae NOT DETECTED NOT DETECTED Final   Proteus species NOT DETECTED NOT DETECTED Final   Serratia marcescens NOT DETECTED NOT DETECTED Final   Haemophilus influenzae NOT DETECTED NOT DETECTED Final   Neisseria meningitidis NOT DETECTED NOT DETECTED Final   Pseudomonas aeruginosa NOT DETECTED NOT DETECTED Final   Candida albicans NOT DETECTED NOT DETECTED Final   Candida glabrata NOT DETECTED NOT DETECTED Final   Candida krusei NOT DETECTED NOT DETECTED Final   Candida parapsilosis NOT DETECTED NOT DETECTED Final   Candida tropicalis NOT DETECTED NOT DETECTED Final    Comment: Performed at [facility], [address] [place], [place] [address]  Respiratory Panel by RT PCR (Flu A&B, Covid) - Nasopharyngeal Swab     Status: None   Collection Time: 05/12/19  6:10 PM   Specimen: Nasopharyngeal Swab  Result Value Ref Range Status   SARS Coronavirus 2 by RT PCR NEGATIVE NEGATIVE Final    Comment: (NOTE) SARS-CoV-2 target nucleic acids are NOT DETECTED. The SARS-CoV-2 RNA is generally detectable in upper respiratoy specimens during the acute phase of infection. The lowest concentration of SARS-CoV-2 viral copies this assay can detect is 131 copies/mL. A negative result does not preclude SARS-Cov-2 infection and should not be used as the sole basis for treatment or other patient management decisions. A negative result may occur with  improper specimen collection/handling, submission of specimen other than nasopharyngeal swab, presence of viral mutation(s) within the areas targeted by this assay, and inadequate number of viral copies (<131 copies/mL). A negative result must be combined with clinical observations, patient history, and epidemiological information. The expected result is Negative. Fact Sheet for Patients:    PinkCheek.be Fact Sheet for Healthcare Providers:  GravelBags.it This test is not yet ap proved or cleared by the [place] FDA and  has been authorized for detection and/or diagnosis of SARS-CoV-2 by FDA under an Emergency Use Authorization (EUA). This EUA will remain  in effect (meaning this test can be used) for the duration of the COVID-19 declaration under Section 564(b)(1) of the Act, 21 U.S.C. section 360bbb-3(b)(1), unless the authorization is terminated or revoked sooner.    Influenza A by PCR NEGATIVE NEGATIVE Final  Influenza B by PCR NEGATIVE NEGATIVE Final    Comment: (NOTE) The Xpert Xpress SARS-CoV-2/FLU/RSV assay is intended as an aid in  the diagnosis of influenza from Nasopharyngeal swab specimens and  should not be used as a sole basis for treatment. Nasal washings and  aspirates are unacceptable for Xpert Xpress SARS-CoV-2/FLU/RSV  testing. Fact Sheet for Patients: PinkCheek.be Fact Sheet for Healthcare Providers: GravelBags.it This test is not yet approved or cleared by the [place] FDA and  has been authorized for detection and/or diagnosis of SARS-CoV-2 by  FDA under an Emergency Use Authorization (EUA). This EUA will remain  in effect (meaning this test can be used) for the duration of the  Covid-19 declaration under Section 564(b)(1) of the Act, 21  U.S.C. section 360bbb-3(b)(1), unless the authorization is  terminated or revoked. Performed at [facility], [address] [place], [place] [address]   MRSA PCR Screening     Status: None   Collection Time: 05/13/19 12:04 AM   Specimen: Nasopharyngeal  Result Value Ref Range Status   MRSA by PCR NEGATIVE NEGATIVE Final    Comment:        The GeneXpert MRSA Assay (FDA approved for NASAL specimens only), is one component of a comprehensive MRSA colonization surveillance  program. It is not intended to diagnose MRSA infection nor to guide or monitor treatment for MRSA infections. Performed at [facility], [address] [place], [place] [address]     IMAGING RESULTS: Cervical spine x-ray No evidence for osteomyelitis discitis or septic arthritis within the cervical spine. No other acute infection identified. 2. Degenerative disc osteophyte at C3-4 with resultant moderate canal with severe bilateral C4 foraminal stenosis. 3. Mild bilateral C5 and left C6 foraminal narrowing related to disc bulge and uncovertebral disease. I have personally reviewed the films ? Impression/Recommendation ? ?64 year old male with history of end-stage renal disease on dialysis through permacath is admitted with fever chills and neck pain.  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  Source is the permacath which has been removed. Patient needs repeat blood culture to make sure the bacteria has been eradicated TEE is needed as staph lugdunensis behaves like staph aureus and is patient at risk for endocarditis. ?This is methicillin susceptible and hence patient is currently on cefazolin.  Duration of the antibiotics will depend on TEE. If patient is not willing to undergo TEE then he will need 6 weeks of IV antibiotics.  If TEE is negative then he will need 2- 4 weeks of antibiotics.  We will have to see how soon the repeat blood culture is negative   Contact dermatitis at the site of the permacath.  This is very likely due to the dressing he had before.  Diabetes mellitus management as per primary team  ___________________________________________________ Discussed with patient, requesting provider Note:  This document was prepared using Dragon voice recognition software and may include unintentional dictation errors.

## 2019-05-14 NOTE — Progress Notes (Signed)
[facility]  ROUNDING NOTE   Subjective:  Patient states that he is feeling much better today. Appreciate vascular surgery assistance as the did remove PermCath. Patient did undergo short dialysis treatment yesterday. We do plan to keep him catheter free for at least 2 days.   Objective:  Vital signs in last 24 hours:  Temp:  [97.7 F (36.5 C)-98.7 F (37.1 C)] 98.5 F (36.9 C) (05/11 1146) Pulse Rate:  [79-90] 79 (05/11 1146) Resp:  [14-22] 14 (05/11 1146) BP: (137-189)/(71-86) 165/82 (05/11 1146) SpO2:  [100 %] 100 % (05/11 1146)  Weight change:  Filed Weights   05/12/19 1802 05/13/19 0500  Weight: 76.2 kg 73.1 kg    Intake/Output: I/O last 3 completed shifts: In: 789.5 [P.O.:480; I.V.:9.5; IV Piggyback:300] Out: 6720 [Urine:200; Other:1162]   Intake/Output this shift:  Total I/O In: 120 [P.O.:120] Out: -   Physical Exam: General: No acute distress  Head: Normocephalic, atraumatic. Moist oral mucosal membranes  Eyes: Anicteric  Neck: Supple, trachea midline  Lungs:  Clear to auscultation, normal effort  Heart: S1S2 no rubs  Abdomen:  Soft, nontender, bowel sounds present  Extremities: Trace peripheral edema.  Neurologic: Awake, alert, following commands  Skin: No lesions  Access: Right IJ PermCath    Basic Metabolic Panel: Recent Labs  Lab 05/12/19 1809 05/13/19 0547 05/14/19 0519  NA 133* 133* 132*  K 3.6 3.5 3.6  CL 94* 93* 93*  CO2 21* 25 25  GLUCOSE 120* 121* 109*  BUN 52* 57* 40*  CREATININE 7.23* 8.12* 6.32*  CALCIUM 9.4 9.2 9.0    Liver Function Tests: Recent Labs  Lab 05/12/19 1809 05/13/19 0547  AST 23 20  ALT 23 20  ALKPHOS 117 99  BILITOT 1.0 0.7  PROT 7.5 7.0  ALBUMIN 4.0 3.5   No results for input(s): LIPASE, AMYLASE in the last 168 hours. No results for input(s): AMMONIA in the last 168 hours.  CBC: Recent Labs  Lab 05/12/19 1809 05/13/19 0547 05/14/19 0519  WBC 21.4* 27.4* 15.1*  NEUTROABS 19.3*   --  11.6*  HGB 11.7* 10.7* 10.7*  HCT 35.1* 30.6* 31.5*  MCV 89.1 86.9 88.5  PLT 316 273 263    Cardiac Enzymes: No results for input(s): CKTOTAL, CKMB, CKMBINDEX, TROPONINI in the last 168 hours.  BNP: Invalid input(s): POCBNP  CBG: No results for input(s): GLUCAP in the last 168 hours.  Microbiology: Results for orders placed or performed during the hospital encounter of 05/12/19  Culture, blood (Routine x 2)     Status: None (Preliminary result)   Collection Time: 05/12/19  6:09 PM   Specimen: BLOOD  Result Value Ref Range Status   Specimen Description BLOOD LEFT FA  Final   Special Requests   Final    BOTTLES DRAWN AEROBIC AND ANAEROBIC Blood Culture adequate volume   Culture  Setup Time   Final    GRAM POSITIVE COCCI IN BOTH AEROBIC AND ANAEROBIC BOTTLES CRITICAL VALUE NOTED.  VALUE IS CONSISTENT WITH PREVIOUSLY REPORTED AND CALLED VALUE. Performed at [facility], [address] [place], [place] [address]    Culture [facility] POSITIVE COCCI  Final   Report Status PENDING  Incomplete  Culture, blood (Routine x 2)     Status: None (Preliminary result)   Collection Time: 05/12/19  6:09 PM   Specimen: BLOOD  Result Value Ref Range Status   Specimen Description   Final    BLOOD L HAND Performed at [facility], [address] [place], [place] [address]    Special Requests   Final    BOTTLES DRAWN AEROBIC AND ANAEROBIC Blood Culture adequate volume Performed at [facility], [address] [place], [place] [address]    Culture  Setup Time   Final    GRAM POSITIVE COCCI IN BOTH AEROBIC AND ANAEROBIC BOTTLES CRITICAL RESULT CALLED TO, READ BACK BY AND VERIFIED WITH: CHRISTINE KATSOUDAS AT 4580 ON 05/13/19 SNG Performed at [facility], [address] [place], [place] [address]    Culture GRAM POSITIVE COCCI  Final   Report Status PENDING  Incomplete  Blood Culture ID Panel (Reflexed)     Status: Abnormal   Collection Time: 05/12/19   6:09 PM  Result Value Ref Range Status   Enterococcus species NOT DETECTED NOT DETECTED Final   Listeria monocytogenes NOT DETECTED NOT DETECTED Final   Staphylococcus species DETECTED (A) NOT DETECTED Final    Comment: Methicillin (oxacillin) susceptible coagulase negative staphylococcus. Possible blood culture contaminant (unless isolated from more than one blood culture draw or clinical case suggests pathogenicity). No antibiotic treatment is indicated for blood  culture contaminants. CRITICAL RESULT CALLED TO, READ BACK BY AND VERIFIED WITH: CHRISTINE KATSOUDAS AT 8250 ON 05/13/19 SNG    Staphylococcus aureus (BCID) NOT DETECTED NOT DETECTED Final   Methicillin resistance NOT DETECTED NOT DETECTED Final   Streptococcus species NOT DETECTED NOT DETECTED Final   Streptococcus agalactiae NOT DETECTED NOT DETECTED Final   Streptococcus pneumoniae NOT DETECTED NOT DETECTED Final   Streptococcus pyogenes NOT DETECTED NOT DETECTED Final   Acinetobacter baumannii NOT DETECTED NOT DETECTED Final   Enterobacteriaceae species NOT DETECTED NOT DETECTED Final   Enterobacter cloacae complex NOT DETECTED NOT DETECTED Final   Escherichia coli NOT DETECTED NOT DETECTED Final   Klebsiella oxytoca NOT DETECTED NOT DETECTED Final   Klebsiella pneumoniae NOT DETECTED NOT DETECTED Final   Proteus species NOT DETECTED NOT DETECTED Final   Serratia marcescens NOT DETECTED NOT DETECTED Final   Haemophilus influenzae NOT DETECTED NOT DETECTED Final   Neisseria meningitidis NOT DETECTED NOT DETECTED Final   Pseudomonas aeruginosa NOT DETECTED NOT DETECTED Final   Candida albicans NOT DETECTED NOT DETECTED Final   Candida glabrata NOT DETECTED NOT DETECTED Final   Candida krusei NOT DETECTED NOT DETECTED Final   Candida parapsilosis NOT DETECTED NOT DETECTED Final   Candida tropicalis NOT DETECTED NOT DETECTED Final    Comment: Performed at [facility], [address] [place], [place] [address]   Respiratory Panel by RT PCR (Flu A&B, Covid) - Nasopharyngeal Swab     Status: None   Collection Time: 05/12/19  6:10 PM   Specimen: Nasopharyngeal Swab  Result Value Ref Range Status   SARS Coronavirus 2 by RT PCR NEGATIVE NEGATIVE Final    Comment: (NOTE) SARS-CoV-2 target nucleic acids are NOT DETECTED. The SARS-CoV-2 RNA is generally detectable in upper respiratoy specimens during the acute phase of infection. The lowest concentration of SARS-CoV-2 viral copies this assay can detect is 131 copies/mL. A negative result does not preclude SARS-Cov-2 infection and should not be used as the sole basis for treatment or other patient management decisions. A negative result may occur with  improper specimen collection/handling, submission of specimen other than nasopharyngeal swab, presence of viral mutation(s) within the areas targeted by this assay, and inadequate number of viral copies (<131 copies/mL). A negative result must be combined with clinical observations, patient history, and epidemiological information. The expected result is  Negative. Fact Sheet for Patients:  PinkCheek.be Fact Sheet for Healthcare Providers:  GravelBags.it This test is not yet ap proved or cleared by the [place] FDA and  has been authorized for detection and/or diagnosis of SARS-CoV-2 by FDA under an Emergency Use Authorization (EUA). This EUA will remain  in effect (meaning this test can be used) for the duration of the COVID-19 declaration under Section 564(b)(1) of the Act, 21 U.S.C. section 360bbb-3(b)(1), unless the authorization is terminated or revoked sooner.    Influenza A by PCR NEGATIVE NEGATIVE Final   Influenza B by PCR NEGATIVE NEGATIVE Final    Comment: (NOTE) The Xpert Xpress SARS-CoV-2/FLU/RSV assay is intended as an aid in  the diagnosis of influenza from Nasopharyngeal swab specimens and  should not be used as a sole  basis for treatment. Nasal washings and  aspirates are unacceptable for Xpert Xpress SARS-CoV-2/FLU/RSV  testing. Fact Sheet for Patients: PinkCheek.be Fact Sheet for Healthcare Providers: GravelBags.it This test is not yet approved or cleared by the [place] FDA and  has been authorized for detection and/or diagnosis of SARS-CoV-2 by  FDA under an Emergency Use Authorization (EUA). This EUA will remain  in effect (meaning this test can be used) for the duration of the  Covid-19 declaration under Section 564(b)(1) of the Act, 21  U.S.C. section 360bbb-3(b)(1), unless the authorization is  terminated or revoked. Performed at [facility], [address] [place], [place] [address]   MRSA PCR Screening     Status: None   Collection Time: 05/13/19 12:04 AM   Specimen: Nasopharyngeal  Result Value Ref Range Status   MRSA by PCR NEGATIVE NEGATIVE Final    Comment:        The GeneXpert MRSA Assay (FDA approved for NASAL specimens only), is one component of a comprehensive MRSA colonization surveillance program. It is not intended to diagnose MRSA infection nor to guide or monitor treatment for MRSA infections. Performed at [facility], [address] [place], [place] [address]     Coagulation Studies: Recent Labs    05/12/19 1809  LABPROT 14.0  INR 1.1    Urinalysis: No results for input(s): COLORURINE, LABSPEC, PHURINE, GLUCOSEU, HGBUR, BILIRUBINUR, KETONESUR, PROTEINUR, UROBILINOGEN, NITRITE, LEUKOCYTESUR in the last 72 hours.  Invalid input(s): APPERANCEUR    Imaging: MR CERVICAL SPINE WO CONTRAST  Result Date: 05/13/2019 CLINICAL DATA:  Darrick Grinder initial evaluation for acute sepsis, concern for discitis. EXAM: MRI CERVICAL SPINE WITHOUT CONTRAST TECHNIQUE: Multiplanar, multisequence MR imaging of the cervical spine was performed. No intravenous contrast was administered. COMPARISON:   None available. FINDINGS: Alignment: Straightening of the normal cervical lordosis. No listhesis or subluxation. Vertebrae: Vertebral body height maintained without evidence for acute or chronic fracture. Bone marrow signal intensity within normal limits. No discrete or worrisome osseous lesions. Discogenic reactive endplate changes present about the C3-4 interspace. No other abnormal marrow edema. No findings to suggest osteomyelitis discitis or septic arthritis. Cord: Signal intensity within the cervical spinal cord is normal. No epidural collections identified. Posterior Fossa, vertebral arteries, paraspinal tissues: Note made of a empty sella. Visualized brain and posterior fossa otherwise unremarkable. Sphenoid sinus disease partially visualized. Craniocervical junction normal. Paraspinous and prevertebral soft tissues within normal limits. Normal flow voids seen within the vertebral arteries bilaterally. Disc levels: C2-C3: Mild uncovertebral hypertrophy without significant disc bulge. No canal or foraminal stenosis. C3-C4: Chronic intervertebral disc space narrowing with diffuse degenerative disc osteophyte. Flattening and effacement of the ventral thecal sac with resultant  moderate spinal stenosis. Mild cord flattening without cord signal changes. Severe bilateral C4 foraminal stenosis. C4-C5: Mild disc bulge with uncovertebral hypertrophy. No significant spinal stenosis. Mild bilateral C5 foraminal narrowing. C5-C6: Mild disc bulge with uncovertebral spurring. No significant spinal stenosis. Mild left C6 foraminal stenosis. No significant right foraminal narrowing. C6-C7: Normal interspace. Mild facet degeneration on the right. No canal or foraminal stenosis. C7-T1:  Unremarkable. Visualized upper thoracic spine demonstrates no significant finding. IMPRESSION: 1. No evidence for osteomyelitis discitis or septic arthritis within the cervical spine. No other acute infection identified. 2. Degenerative disc  osteophyte at C3-4 with resultant moderate canal with severe bilateral C4 foraminal stenosis. 3. Mild bilateral C5 and left C6 foraminal narrowing related to disc bulge and uncovertebral disease. Electronically Signed   By: Jeannine Boga M.D.   On: 05/13/2019 03:11   PERIPHERAL VASCULAR CATHETERIZATION  Result Date: 05/13/2019 See op note  DG Chest Portable 1 View  Result Date: 05/12/2019 CLINICAL DATA:  Sepsis EXAM: PORTABLE CHEST 1 VIEW COMPARISON:  04/17/2018 FINDINGS: There is a well-positioned tunneled dialysis catheter on the right. The heart size is stable. There are few hazy airspace opacities in the lung bases bilaterally favored to represent areas of atelectasis. There is no pneumothorax. No pleural effusion. There is mild vascular congestion without overt pulmonary edema. There is no acute osseous abnormality. IMPRESSION: No active disease. Electronically Signed   By: Constance Holster M.D.   On: 05/12/2019 18:54     Medications:   . sodium chloride 10 mL/hr at 05/13/19 2000  .  ceFAZolin (ANCEF) IV     . amLODipine  10 mg Oral Daily  . carvedilol  12.5 mg Oral BID WC  . Chlorhexidine Gluconate Cloth  6 each Topical Daily  . Chlorhexidine Gluconate Cloth  6 each Topical Q0600  . citalopram  20 mg Oral Daily  . ferric citrate  210 mg Oral TID WC  . heparin  5,000 Units Subcutaneous Q8H  . hydrALAZINE  25 mg Oral Q8H  . linagliptin  5 mg Oral Daily  . magic mouthwash  10 mL Oral QID  . polyethylene glycol  17 g Oral Once  . pravastatin  40 mg Oral q1800  . senna-docusate  2 tablet Oral BID   sodium chloride, acetaminophen **OR** acetaminophen, aspirin-acetaminophen-caffeine, calcium carbonate, camphor-menthol **AND** hydrOXYzine, feeding supplement (NEPRO CARB STEADY), ondansetron **OR** ondansetron (ZOFRAN) IV, sorbitol, traZODone  Assessment/ Plan:  64 y.o. male with past medical history of COPD, diabetes mellitus type 2, BPH, hypertension, hyperlipidemia, ESRD on  HD TTS, anemia of chronic kidney disease, secondary hyperparathyroidism who was admitted with fever and suspected sepsis.  [facility] [address]/TTS  1.  ESRD on HD TTS.  Patient underwent hemodialysis yesterday as we had planned to remove his PermCath given suspected line sepsis.  We will attempt to keep him catheter free for the next 2 days.  No immediate need for dialysis at the moment.  Watch for hyperkalemia closely.  2.  Fever with suspected sepsis.  PermCath was the suspected source.  Patient feeling much better since the catheter has been removed.  We will consult with infectious disease for further evaluation management.  3.  Anemia of chronic kidney disease.  Hold off on Epogen for now.  4.  Secondary hyperparathyroidism.  Continue to periodically monitor bone mineral metabolism parameters.   LOS: 2 Jilliane Kazanjian 5/11/202112:49 PM

## 2019-05-14 NOTE — Progress Notes (Addendum)
PROGRESS NOTE    Daniel Thomas  ZOX:[id number] DOB: [birth year] DOA: 05/12/2019 PCP: Marguerita Merles, MD    Brief Narrative:  Daniel Fofana Tottenis a 64 y.o.malewith medical history significant ofend-stage renal disease on hemodialysis on Tuesdays Thursdays and Saturdays, hypertension, diabetes currently not on medications, hypertension, hyperlipidemia and BPH who came to the ER complaining of neck pain shortness of breath and fever.  Upon arrival in the emergency room, he had a temperature of 103, leukocytosis with a white cell count of 21.4.  Covid was negative.  Chest x-ray had no acute changes.  He has not made any urine.  Patient was diagnosed with sepsis, with potential line infection.  He is placed on vancomycin, cefepime and Flagyl.  5/11.  Blood cultures are positive for coag negative staph coccus, likely due to line infection.  Permacath removed.  Repeat blood cultures today.  Discussed with pharmacy, change antibiotic to cefazolin.    Assessment & Plan:   Principal Problem:   Sepsis ([place]) Active Problems:   Malignant hypertension   ESRD (end stage renal disease) ([facility])   Leucocytosis  #1.  Coag negative staph coccus septicemia. Secondary to line infection.  Permacath removed.  Repeat blood cultures.  Change antibiotic to cefazolin, probably will treat patient with additional 2 weeks of cefazolin from today's date if repeat cultures negative.  ID consult requested. Called cardiology for TEE.   #2.  Neck pain. CT scan of the neck showed degenerative disc disease, no evidence of osteomyelitis.  Most likely due to osteoarthritis.  Symptomatic treatment.  3.  End-stage renal disease. Followed by nephrology, may be able to place another permacath tomorrow if the repeat culture results come back negative in 24 hours.  4.  Benign prostate hypertrophy. Continue home medicines.  5.  Essential hypertension.   Continue home medicine.  6.  Constipation.   On Senna, still no bowel  movement, add MiraLAX 1 time.  DVT prophylaxis: Heparin Code Status: Full Family Communication: Treatment plan updated to the patient and his brother in the room. Disposition Plan:   Patient came from: Home                                                                                                                           Anticipated d/c place: Home  Barriers to d/c OR conditions which need to be met to effect a safe d/c:   Consultants:   Nephrology  Procedures: None Antimicrobials: We will change to cefazolin   Subjective: Patient had some headache yesterday, feels better today. No fever or chills. No abdominal pain or nausea vomiting.  Feel constipated.  Objective: Vitals:   05/13/19 1430 05/13/19 1450 05/13/19 1954 05/14/19 0440  BP: (!) 148/80 (!) 156/81 (!) 189/80 (!) 162/77  Pulse: 88 90 86 84  Resp: (!) 21 17 20 16   Temp:  98.7 F (37.1 C) 97.9 F (36.6 C) 97.7 F (36.5 C)  TempSrc:  Oral Oral Oral  SpO2:  100% 100% 100%  Weight:      Height:        Intake/Output Summary (Last 24 hours) at 05/14/2019 1023 Last data filed at 05/14/2019 0900 Gross per 24 hour  Intake 589.5 ml  Output 1362 ml  Net -772.5 ml   Filed Weights   05/17/19 1802 05/13/19 0500  Weight: 76.2 kg 73.1 kg    Examination:  General exam: Appears calm and comfortable  Respiratory system: Clear to auscultation. Respiratory effort normal. Cardiovascular system: S1 & S2 heard, RRR. No JVD, murmurs, rubs, gallops or clicks. No pedal edema. Gastrointestinal system: Abdomen is nondistended, soft and nontender. No organomegaly or masses felt. Normal bowel sounds heard. Central nervous system: Alert and oriented. No focal neurological deficits. Extremities: Symmetric Skin: No rashes, lesions or ulcers Psychiatry: Judgement and insight appear normal. Mood & affect appropriate.     Data Reviewed: I have personally reviewed following labs and imaging studies  CBC: Recent Labs    Lab May 17, 2019 1809 05/13/19 0547 05/14/19 0519  WBC 21.4* 27.4* 15.1*  NEUTROABS 19.3*  --  11.6*  HGB 11.7* 10.7* 10.7*  HCT 35.1* 30.6* 31.5*  MCV 89.1 86.9 88.5  PLT 316 273 382   Basic Metabolic Panel: Recent Labs  Lab 05-17-19 1809 05/13/19 0547 05/14/19 0519  NA 133* 133* 132*  K 3.6 3.5 3.6  CL 94* 93* 93*  CO2 21* 25 25  GLUCOSE 120* 121* 109*  BUN 52* 57* 40*  CREATININE 7.23* 8.12* 6.32*  CALCIUM 9.4 9.2 9.0   GFR: Estimated Creatinine Clearance: 9.8 mL/min (A) (by C-G formula based on SCr of 6.32 mg/dL (H)). Liver Function Tests: Recent Labs  Lab 05-17-19 1809 05/13/19 0547  AST 23 20  ALT 23 20  ALKPHOS 117 99  BILITOT 1.0 0.7  PROT 7.5 7.0  ALBUMIN 4.0 3.5   No results for input(s): LIPASE, AMYLASE in the last 168 hours. No results for input(s): AMMONIA in the last 168 hours. Coagulation Profile: Recent Labs  Lab May 17, 2019 1809  INR 1.1   Cardiac Enzymes: No results for input(s): CKTOTAL, CKMB, CKMBINDEX, TROPONINI in the last 168 hours. BNP (last 3 results) No results for input(s): PROBNP in the last 8760 hours. HbA1C: No results for input(s): HGBA1C in the last 72 hours. CBG: No results for input(s): GLUCAP in the last 168 hours. Lipid Profile: No results for input(s): CHOL, HDL, LDLCALC, TRIG, CHOLHDL, LDLDIRECT in the last 72 hours. Thyroid Function Tests: No results for input(s): TSH, T4TOTAL, FREET4, T3FREE, THYROIDAB in the last 72 hours. Anemia Panel: No results for input(s): VITAMINB12, FOLATE, FERRITIN, TIBC, IRON, RETICCTPCT in the last 72 hours. Sepsis Labs: Recent Labs  Lab 05-17-2019 1809  LATICACIDVEN 1.0    Recent Results (from the past 240 hour(s))  Culture, blood (Routine x 2)     Status: None (Preliminary result)   Collection Time: 05-17-19  6:09 PM   Specimen: BLOOD  Result Value Ref Range Status   Specimen Description BLOOD LEFT FA  Final   Special Requests   Final    BOTTLES DRAWN AEROBIC AND ANAEROBIC Blood  Culture adequate volume   Culture  Setup Time   Final    GRAM POSITIVE COCCI IN BOTH AEROBIC AND ANAEROBIC BOTTLES CRITICAL VALUE NOTED.  VALUE IS CONSISTENT WITH PREVIOUSLY REPORTED AND CALLED VALUE. Performed at [facility], [address], [place], [place] [address]    Culture [facility] POSITIVE COCCI  Final   Report Status PENDING  Incomplete  Culture,  blood (Routine x 2)     Status: None (Preliminary result)   Collection Time: 05/12/19  6:09 PM   Specimen: BLOOD  Result Value Ref Range Status   Specimen Description   Final    BLOOD L HAND Performed at [facility], [address], [place], [place] [address]    Special Requests   Final    BOTTLES DRAWN AEROBIC AND ANAEROBIC Blood Culture adequate volume Performed at [facility], [address] [place], [place] [address]    Culture  Setup Time   Final    GRAM POSITIVE COCCI IN BOTH AEROBIC AND ANAEROBIC BOTTLES CRITICAL RESULT CALLED TO, READ BACK BY AND VERIFIED WITH: CHRISTINE KATSOUDAS AT 6546 ON 05/13/19 SNG Performed at [facility], [place] [address] [place], [place] [address]    Culture GRAM POSITIVE COCCI  Final   Report Status PENDING  Incomplete  Blood Culture ID Panel (Reflexed)     Status: Abnormal   Collection Time: 05/12/19  6:09 PM  Result Value Ref Range Status   Enterococcus species NOT DETECTED NOT DETECTED Final   Listeria monocytogenes NOT DETECTED NOT DETECTED Final   Staphylococcus species DETECTED (A) NOT DETECTED Final    Comment: Methicillin (oxacillin) susceptible coagulase negative staphylococcus. Possible blood culture contaminant (unless isolated from more than one blood culture draw or clinical case suggests pathogenicity). No antibiotic treatment is indicated for blood  culture contaminants. CRITICAL RESULT CALLED TO, READ BACK BY AND VERIFIED WITH: CHRISTINE KATSOUDAS AT 6568 ON 05/13/19 SNG    Staphylococcus aureus (BCID) NOT DETECTED NOT DETECTED Final    Methicillin resistance NOT DETECTED NOT DETECTED Final   Streptococcus species NOT DETECTED NOT DETECTED Final   Streptococcus agalactiae NOT DETECTED NOT DETECTED Final   Streptococcus pneumoniae NOT DETECTED NOT DETECTED Final   Streptococcus pyogenes NOT DETECTED NOT DETECTED Final   Acinetobacter baumannii NOT DETECTED NOT DETECTED Final   Enterobacteriaceae species NOT DETECTED NOT DETECTED Final   Enterobacter cloacae complex NOT DETECTED NOT DETECTED Final   Escherichia coli NOT DETECTED NOT DETECTED Final   Klebsiella oxytoca NOT DETECTED NOT DETECTED Final   Klebsiella pneumoniae NOT DETECTED NOT DETECTED Final   Proteus species NOT DETECTED NOT DETECTED Final   Serratia marcescens NOT DETECTED NOT DETECTED Final   Haemophilus influenzae NOT DETECTED NOT DETECTED Final   Neisseria meningitidis NOT DETECTED NOT DETECTED Final   Pseudomonas aeruginosa NOT DETECTED NOT DETECTED Final   Candida albicans NOT DETECTED NOT DETECTED Final   Candida glabrata NOT DETECTED NOT DETECTED Final   Candida krusei NOT DETECTED NOT DETECTED Final   Candida parapsilosis NOT DETECTED NOT DETECTED Final   Candida tropicalis NOT DETECTED NOT DETECTED Final    Comment: Performed at [facility], [address] [place], [place] [address]  Respiratory Panel by RT PCR (Flu A&B, Covid) - Nasopharyngeal Swab     Status: None   Collection Time: 05/12/19  6:10 PM   Specimen: Nasopharyngeal Swab  Result Value Ref Range Status   SARS Coronavirus 2 by RT PCR NEGATIVE NEGATIVE Final    Comment: (NOTE) SARS-CoV-2 target nucleic acids are NOT DETECTED. The SARS-CoV-2 RNA is generally detectable in upper respiratoy specimens during the acute phase of infection. The lowest concentration of SARS-CoV-2 viral copies this assay can detect is 131 copies/mL. A negative result does not preclude SARS-Cov-2 infection and should not be used as the sole basis for treatment or other patient management decisions.  A negative result may  occur with  improper specimen collection/handling, submission of specimen other than nasopharyngeal swab, presence of viral mutation(s) within the areas targeted by this assay, and inadequate number of viral copies (<131 copies/mL). A negative result must be combined with clinical observations, patient history, and epidemiological information. The expected result is Negative. Fact Sheet for Patients:  PinkCheek.be Fact Sheet for Healthcare Providers:  GravelBags.it This test is not yet ap proved or cleared by the [place] FDA and  has been authorized for detection and/or diagnosis of SARS-CoV-2 by FDA under an Emergency Use Authorization (EUA). This EUA will remain  in effect (meaning this test can be used) for the duration of the COVID-19 declaration under Section 564(b)(1) of the Act, 21 U.S.C. section 360bbb-3(b)(1), unless the authorization is terminated or revoked sooner.    Influenza A by PCR NEGATIVE NEGATIVE Final   Influenza B by PCR NEGATIVE NEGATIVE Final    Comment: (NOTE) The Xpert Xpress SARS-CoV-2/FLU/RSV assay is intended as an aid in  the diagnosis of influenza from Nasopharyngeal swab specimens and  should not be used as a sole basis for treatment. Nasal washings and  aspirates are unacceptable for Xpert Xpress SARS-CoV-2/FLU/RSV  testing. Fact Sheet for Patients: PinkCheek.be Fact Sheet for Healthcare Providers: GravelBags.it This test is not yet approved or cleared by the [place] FDA and  has been authorized for detection and/or diagnosis of SARS-CoV-2 by  FDA under an Emergency Use Authorization (EUA). This EUA will remain  in effect (meaning this test can be used) for the duration of the  Covid-19 declaration under Section 564(b)(1) of the Act, 21  U.S.C. section 360bbb-3(b)(1), unless the authorization is    terminated or revoked. Performed at [facility], [address] [place], [place] [address]   MRSA PCR Screening     Status: None   Collection Time: 05/13/19 12:04 AM   Specimen: Nasopharyngeal  Result Value Ref Range Status   MRSA by PCR NEGATIVE NEGATIVE Final    Comment:        The GeneXpert MRSA Assay (FDA approved for NASAL specimens only), is one component of a comprehensive MRSA colonization surveillance program. It is not intended to diagnose MRSA infection nor to guide or monitor treatment for MRSA infections. Performed at [facility], [address] [place] [address] [place], [place] [address]          Radiology Studies: MR CERVICAL SPINE WO CONTRAST  Result Date: 05/13/2019 CLINICAL DATA:  Darrick Grinder initial evaluation for acute sepsis, concern for discitis. EXAM: MRI CERVICAL SPINE WITHOUT CONTRAST TECHNIQUE: Multiplanar, multisequence MR imaging of the cervical spine was performed. No intravenous contrast was administered. COMPARISON:  None available. FINDINGS: Alignment: Straightening of the normal cervical lordosis. No listhesis or subluxation. Vertebrae: Vertebral body height maintained without evidence for acute or chronic fracture. Bone marrow signal intensity within normal limits. No discrete or worrisome osseous lesions. Discogenic reactive endplate changes present about the C3-4 interspace. No other abnormal marrow edema. No findings to suggest osteomyelitis discitis or septic arthritis. Cord: Signal intensity within the cervical spinal cord is normal. No epidural collections identified. Posterior Fossa, vertebral arteries, paraspinal tissues: Note made of a empty sella. Visualized brain and posterior fossa otherwise unremarkable. Sphenoid sinus disease partially visualized. Craniocervical junction normal. Paraspinous and prevertebral soft tissues within normal limits. Normal flow voids seen within the vertebral arteries bilaterally. Disc levels: C2-C3: Mild  uncovertebral hypertrophy without significant disc bulge. No canal or foraminal stenosis. C3-C4: Chronic intervertebral disc space narrowing with diffuse degenerative disc  osteophyte. Flattening and effacement of the ventral thecal sac with resultant moderate spinal stenosis. Mild cord flattening without cord signal changes. Severe bilateral C4 foraminal stenosis. C4-C5: Mild disc bulge with uncovertebral hypertrophy. No significant spinal stenosis. Mild bilateral C5 foraminal narrowing. C5-C6: Mild disc bulge with uncovertebral spurring. No significant spinal stenosis. Mild left C6 foraminal stenosis. No significant right foraminal narrowing. C6-C7: Normal interspace. Mild facet degeneration on the right. No canal or foraminal stenosis. C7-T1:  Unremarkable. Visualized upper thoracic spine demonstrates no significant finding. IMPRESSION: 1. No evidence for osteomyelitis discitis or septic arthritis within the cervical spine. No other acute infection identified. 2. Degenerative disc osteophyte at C3-4 with resultant moderate canal with severe bilateral C4 foraminal stenosis. 3. Mild bilateral C5 and left C6 foraminal narrowing related to disc bulge and uncovertebral disease. Electronically Signed   By: Jeannine Boga M.D.   On: 05/13/2019 03:11   PERIPHERAL VASCULAR CATHETERIZATION  Result Date: 05/13/2019 See op note  DG Chest Portable 1 View  Result Date: 05/12/2019 CLINICAL DATA:  Sepsis EXAM: PORTABLE CHEST 1 VIEW COMPARISON:  04/17/2018 FINDINGS: There is a well-positioned tunneled dialysis catheter on the right. The heart size is stable. There are few hazy airspace opacities in the lung bases bilaterally favored to represent areas of atelectasis. There is no pneumothorax. No pleural effusion. There is mild vascular congestion without overt pulmonary edema. There is no acute osseous abnormality. IMPRESSION: No active disease. Electronically Signed   By: Constance Holster M.D.   On: 05/12/2019  18:54        Scheduled Meds: . amLODipine  10 mg Oral Daily  . carvedilol  12.5 mg Oral BID WC  . Chlorhexidine Gluconate Cloth  6 each Topical Daily  . Chlorhexidine Gluconate Cloth  6 each Topical Q0600  . citalopram  20 mg Oral Daily  . ferric citrate  210 mg Oral TID WC  . heparin  5,000 Units Subcutaneous Q8H  . hydrALAZINE  25 mg Oral Q8H  . linagliptin  5 mg Oral Daily  . magic mouthwash  10 mL Oral QID  . pravastatin  40 mg Oral q1800  . senna-docusate  2 tablet Oral BID   Continuous Infusions: . sodium chloride 10 mL/hr at 05/13/19 2000  . ceFEPime (MAXIPIME) IV Stopped (05/13/19 1904)  . vancomycin       LOS: 2 days    Time spent: 25 minutes    Sharen Hones, MD [facility]   To contact the attending provider between 7A-7P or the covering provider during after hours 7P-7A, please log into the web site [email or web address] and access using universal [place] password for that web site. If you do not have the password, please call the hospital operator.  05/14/2019, 10:23 AM

## 2019-05-14 NOTE — Progress Notes (Signed)
   05/14/19 1000  Clinical Encounter Type  Visited With Patient  Visit Type Initial  Referral From Nurse  Consult/Referral To Chaplain  Chaplain stopped in to discuss AD. Daniel Thomas said that he doesn't want AD. Chaplain will mark complete.

## 2019-05-15 ENCOUNTER — Inpatient Hospital Stay: Admit: 2019-05-15 | Payer: Medicare Other

## 2019-05-15 ENCOUNTER — Encounter
Admission: EM | Disposition: A | Discharge status: Home / Self Care | Payer: Self-pay | Source: Home / Self Care | Attending: Internal Medicine

## 2019-05-15 ENCOUNTER — Inpatient Hospital Stay
Admit: 2019-05-15 | Discharge: 2019-05-15 | Disposition: A | Discharge status: Home / Self Care | Payer: Medicare Other | Attending: Cardiology | Admitting: Cardiology

## 2019-05-15 DIAGNOSIS — N400 Enlarged prostate without lower urinary tract symptoms: Secondary | ICD-10-CM

## 2019-05-15 HISTORY — PX: TEE WITHOUT CARDIOVERSION: SHX5443

## 2019-05-15 LAB — BASIC METABOLIC PANEL
Anion gap: 15 (ref 5–15)
BUN: 57 mg/dL — ABNORMAL HIGH (ref 8–23)
CO2: 24 mmol/L (ref 22–32)
Calcium: 8.9 mg/dL (ref 8.9–10.3)
Chloride: 90 mmol/L — ABNORMAL LOW (ref 98–111)
Creatinine, Ser: 7.81 mg/dL — ABNORMAL HIGH (ref 0.61–1.24)
GFR calc Af Amer: 8 mL/min — ABNORMAL LOW (ref >60)
GFR calc non Af Amer: 7 mL/min — ABNORMAL LOW (ref >60)
Glucose, Bld: 104 mg/dL — ABNORMAL HIGH (ref 70–99)
Potassium: 3.7 mmol/L (ref 3.5–5.1)
Sodium: 129 mmol/L — ABNORMAL LOW (ref 135–145)

## 2019-05-15 LAB — CULTURE, BLOOD (ROUTINE X 2)
Special Requests: ADEQUATE
Special Requests: ADEQUATE

## 2019-05-15 LAB — CBC WITH DIFFERENTIAL/PLATELET
Abs Immature Granulocytes: 0.04 10*3/uL (ref 0.00–0.07)
Basophils Absolute: 0.1 10*3/uL (ref 0.0–0.1)
Basophils Relative: 1 %
Eosinophils Absolute: 0.3 10*3/uL (ref 0.0–0.5)
Eosinophils Relative: 2 %
HCT: 29.9 % — ABNORMAL LOW (ref 39.0–52.0)
Hemoglobin: 10.4 g/dL — ABNORMAL LOW (ref 13.0–17.0)
Immature Granulocytes: 0 %
Lymphocytes Relative: 17 %
Lymphs Abs: 1.9 10*3/uL (ref 0.7–4.0)
MCH: 30 pg (ref 26.0–34.0)
MCHC: 34.8 g/dL (ref 30.0–36.0)
MCV: 86.2 fL (ref 80.0–100.0)
Monocytes Absolute: 1.4 10*3/uL — ABNORMAL HIGH (ref 0.1–1.0)
Monocytes Relative: 13 %
Neutro Abs: 7.3 10*3/uL (ref 1.7–7.7)
Neutrophils Relative %: 67 %
Platelets: 210 10*3/uL (ref 150–400)
RBC: 3.47 MIL/uL — ABNORMAL LOW (ref 4.22–5.81)
RDW: 14 % (ref 11.5–15.5)
WBC: 10.9 10*3/uL — ABNORMAL HIGH (ref 4.0–10.5)
nRBC: 0 % (ref 0.0–0.2)

## 2019-05-15 SURGERY — ECHOCARDIOGRAM, TRANSESOPHAGEAL
Anesthesia: Moderate Sedation

## 2019-05-15 MED ORDER — MIDAZOLAM HCL 2 MG/2ML IJ SOLN
INTRAMUSCULAR | Status: AC | PRN
  Administered 2019-05-15: 2 mg via INTRAVENOUS
  Administered 2019-05-15: 1 mg via INTRAVENOUS

## 2019-05-15 MED ORDER — FENTANYL CITRATE (PF) 100 MCG/2ML IJ SOLN
INTRAMUSCULAR | Status: AC
  Filled 2019-05-15: qty 2

## 2019-05-15 MED ORDER — BUTAMBEN-TETRACAINE-BENZOCAINE 2-2-14 % EX AERO
INHALATION_SPRAY | CUTANEOUS | Status: AC
  Filled 2019-05-15: qty 5

## 2019-05-15 MED ORDER — LIDOCAINE VISCOUS HCL 2 % MT SOLN
OROMUCOSAL | Status: AC
  Filled 2019-05-15: qty 15

## 2019-05-15 MED ORDER — SODIUM CHLORIDE 0.9 % IV SOLN: INTRAVENOUS | Status: DC

## 2019-05-15 MED ORDER — SODIUM CHLORIDE FLUSH 0.9 % IV SOLN
INTRAVENOUS | Status: AC
  Filled 2019-05-15: qty 10

## 2019-05-15 MED ORDER — FENTANYL CITRATE (PF) 100 MCG/2ML IJ SOLN
INTRAMUSCULAR | Status: AC | PRN
  Administered 2019-05-15: 50 ug via INTRAVENOUS
  Administered 2019-05-15: 25 ug via INTRAVENOUS

## 2019-05-15 MED ORDER — MIDAZOLAM HCL 5 MG/5ML IJ SOLN
INTRAMUSCULAR | Status: AC
  Filled 2019-05-15: qty 5

## 2019-05-15 NOTE — Progress Notes (Signed)
PROGRESS NOTE    Daniel Thomas  RKY:[id number] DOB: [birth year] DOA: 05/12/2019 PCP: Daniel Merles, MD    Brief Narrative:  Daniel Thomas Daniel Thomas a 64 y.o.malewith medical history significant ofend-stage renal disease on hemodialysis on Tuesdays Thursdays and Saturdays, hypertension, diabetes currently not on medications, hypertension, hyperlipidemia and BPH who came to the ER complaining of neck pain shortness of breath and fever.  Upon arrival in the emergency room, he had a temperature of 103, leukocytosis with a white cell count of 21.4. Covid was negative. Chest x-ray had no acute changes. He has not made any urine. Patient was diagnosed with sepsis, with potential line infection. He is placed on vancomycin, cefepime and Flagyl.    Consultants:   Cardiology, nephrology, ID  Procedures: TEE   Antimicrobials:   Cefazolin   Subjective: No complaints.  Brother at bedside  Objective: Vitals:   05/15/19 1000 05/15/19 1005 05/15/19 1036 05/15/19 1204  BP: 101/67 (!) 107/92 (!) 156/80 (!) 152/76  Pulse: 60 60 67 63  Resp: 11 12 15 14   Temp:   (!) 97.4 F (36.3 C) (!) 97.4 F (36.3 C)  TempSrc:   Oral Oral  SpO2: 94% 95% 100% 97%  Weight:      Height:        Intake/Output Summary (Last 24 hours) at 05/15/2019 1329 Last data filed at 05/14/2019 1748 Gross per 24 hour  Intake 120 ml  Output --  Net 120 ml   Filed Weights   05/12/19 1802 05/13/19 0500 05/15/19 0459  Weight: 76.2 kg 73.1 kg 74.3 kg    Examination:  General exam: Appears calm and comfortable, NAD Respiratory system: Clear to auscultation. Respiratory effort normal. Cardiovascular system: S1 & S2 heard, RRR. No JVD, murmurs, rubs, gallops or clicks.  Gastrointestinal system: Abdomen is nondistended, soft and nontender.  Normal bowel sounds heard. Central nervous system: Alert and oriented.  Extremities: No edema Skin: Warm dry Psychiatry:Mood & affect appropriate current setting.     Data  Reviewed: I have personally reviewed following labs and imaging studies  CBC: Recent Labs  Lab 05/12/19 1809 05/13/19 0547 05/14/19 0519 05/15/19 0552  WBC 21.4* 27.4* 15.1* 10.9*  NEUTROABS 19.3*  --  11.6* 7.3  HGB 11.7* 10.7* 10.7* 10.4*  HCT 35.1* 30.6* 31.5* 29.9*  MCV 89.1 86.9 88.5 86.2  PLT 316 273 263 315   Basic Metabolic Panel: Recent Labs  Lab 05/12/19 1809 05/13/19 0547 05/14/19 0519 05/15/19 0552  NA 133* 133* 132* 129*  K 3.6 3.5 3.6 3.7  CL 94* 93* 93* 90*  CO2 21* 25 25 24   GLUCOSE 120* 121* 109* 104*  BUN 52* 57* 40* 57*  CREATININE 7.23* 8.12* 6.32* 7.81*  CALCIUM 9.4 9.2 9.0 8.9   GFR: Estimated Creatinine Clearance: 8 mL/min (A) (by C-G formula based on SCr of 7.81 mg/dL (H)). Liver Function Tests: Recent Labs  Lab 05/12/19 1809 05/13/19 0547  AST 23 20  ALT 23 20  ALKPHOS 117 99  BILITOT 1.0 0.7  PROT 7.5 7.0  ALBUMIN 4.0 3.5   No results for input(s): LIPASE, AMYLASE in the last 168 hours. No results for input(s): AMMONIA in the last 168 hours. Coagulation Profile: Recent Labs  Lab 05/12/19 1809  INR 1.1   Cardiac Enzymes: No results for input(s): CKTOTAL, CKMB, CKMBINDEX, TROPONINI in the last 168 hours. BNP (last 3 results) No results for input(s): PROBNP in the last 8760 hours. HbA1C: No results for input(s): HGBA1C in the last  72 hours. CBG: No results for input(s): GLUCAP in the last 168 hours. Lipid Profile: No results for input(s): CHOL, HDL, LDLCALC, TRIG, CHOLHDL, LDLDIRECT in the last 72 hours. Thyroid Function Tests: No results for input(s): TSH, T4TOTAL, FREET4, T3FREE, THYROIDAB in the last 72 hours. Anemia Panel: No results for input(s): VITAMINB12, FOLATE, FERRITIN, TIBC, IRON, RETICCTPCT in the last 72 hours. Sepsis Labs: Recent Labs  Lab 05/12/19 1809  LATICACIDVEN 1.0    Recent Results (from the past 240 hour(s))  Culture, blood (Routine x 2)     Status: Abnormal   Collection Time: 05/12/19  6:09 PM     Specimen: BLOOD  Result Value Ref Range Status   Specimen Description   Final    BLOOD LEFT FA Performed at [facility], [address] [place], [place] [address]    Special Requests   Final    BOTTLES DRAWN AEROBIC AND ANAEROBIC Blood Culture adequate volume Performed at [facility], [address] [place], [place] [address]    Culture  Setup Time   Final    GRAM POSITIVE COCCI IN BOTH AEROBIC AND ANAEROBIC BOTTLES CRITICAL VALUE NOTED.  VALUE IS CONSISTENT WITH PREVIOUSLY REPORTED AND CALLED VALUE. Performed at [facility], [address] [place], [place] [address]    Culture (A)  Final    STAPHYLOCOCCUS LUGDUNENSIS SUSCEPTIBILITIES PERFORMED ON PREVIOUS CULTURE WITHIN THE LAST 5 DAYS. Performed at [facility], [place] [address] [place], [place] [address]    Report Status 05/15/2019 FINAL  Final  Culture, blood (Routine x 2)     Status: Abnormal   Collection Time: 05/12/19  6:09 PM   Specimen: BLOOD  Result Value Ref Range Status   Specimen Description   Final    BLOOD L HAND Performed at [facility], [address], [place], [place] [address]    Special Requests   Final    BOTTLES DRAWN AEROBIC AND ANAEROBIC Blood Culture adequate volume Performed at [facility], [address], [place], [place] [address]    Culture  Setup Time   Final    GRAM POSITIVE COCCI IN BOTH AEROBIC AND ANAEROBIC BOTTLES CRITICAL RESULT CALLED TO, READ BACK BY AND VERIFIED WITH: CHRISTINE KATSOUDAS AT 7096 ON 05/13/19 SNG Performed at [facility], [place] [address], [place], [place] [address]    Culture STAPHYLOCOCCUS LUGDUNENSIS (A)  Final   Report Status 05/15/2019 FINAL  Final   Organism ID, Bacteria STAPHYLOCOCCUS LUGDUNENSIS  Final      Susceptibility   Staphylococcus lugdunensis - MIC*    CIPROFLOXACIN <=0.5 SENSITIVE Sensitive     ERYTHROMYCIN <=0.25 SENSITIVE Sensitive     GENTAMICIN <=0.5 SENSITIVE Sensitive      OXACILLIN 2 SENSITIVE Sensitive     TETRACYCLINE <=1 SENSITIVE Sensitive     VANCOMYCIN <=0.5 SENSITIVE Sensitive     TRIMETH/SULFA <=10 SENSITIVE Sensitive     CLINDAMYCIN <=0.25 SENSITIVE Sensitive     RIFAMPIN <=0.5 SENSITIVE Sensitive     Inducible Clindamycin NEGATIVE Sensitive     * STAPHYLOCOCCUS LUGDUNENSIS  Blood Culture ID Panel (Reflexed)     Status: Abnormal   Collection Time: 05/12/19  6:09 PM  Result Value Ref Range Status   Enterococcus species NOT DETECTED NOT DETECTED Final   Listeria monocytogenes NOT DETECTED NOT DETECTED Final   Staphylococcus species DETECTED (A) NOT DETECTED Final    Comment: Methicillin (oxacillin) susceptible coagulase negative staphylococcus. Possible blood culture contaminant (unless isolated from more  than one blood culture draw or clinical case suggests pathogenicity). No antibiotic treatment is indicated for blood  culture contaminants. CRITICAL RESULT CALLED TO, READ BACK BY AND VERIFIED WITH: CHRISTINE KATSOUDAS AT 4970 ON 05/13/19 SNG    Staphylococcus aureus (BCID) NOT DETECTED NOT DETECTED Final   Methicillin resistance NOT DETECTED NOT DETECTED Final   Streptococcus species NOT DETECTED NOT DETECTED Final   Streptococcus agalactiae NOT DETECTED NOT DETECTED Final   Streptococcus pneumoniae NOT DETECTED NOT DETECTED Final   Streptococcus pyogenes NOT DETECTED NOT DETECTED Final   Acinetobacter baumannii NOT DETECTED NOT DETECTED Final   Enterobacteriaceae species NOT DETECTED NOT DETECTED Final   Enterobacter cloacae complex NOT DETECTED NOT DETECTED Final   Escherichia coli NOT DETECTED NOT DETECTED Final   Klebsiella oxytoca NOT DETECTED NOT DETECTED Final   Klebsiella pneumoniae NOT DETECTED NOT DETECTED Final   Proteus species NOT DETECTED NOT DETECTED Final   Serratia marcescens NOT DETECTED NOT DETECTED Final   Haemophilus influenzae NOT DETECTED NOT DETECTED Final   Neisseria meningitidis NOT DETECTED NOT DETECTED Final    Pseudomonas aeruginosa NOT DETECTED NOT DETECTED Final   Candida albicans NOT DETECTED NOT DETECTED Final   Candida glabrata NOT DETECTED NOT DETECTED Final   Candida krusei NOT DETECTED NOT DETECTED Final   Candida parapsilosis NOT DETECTED NOT DETECTED Final   Candida tropicalis NOT DETECTED NOT DETECTED Final    Comment: Performed at [facility], [address] [place], [place] [address]  Respiratory Panel by RT PCR (Flu A&B, Covid) - Nasopharyngeal Swab     Status: None   Collection Time: 05/12/19  6:10 PM   Specimen: Nasopharyngeal Swab  Result Value Ref Range Status   SARS Coronavirus 2 by RT PCR NEGATIVE NEGATIVE Final    Comment: (NOTE) SARS-CoV-2 target nucleic acids are NOT DETECTED. The SARS-CoV-2 RNA is generally detectable in upper respiratoy specimens during the acute phase of infection. The lowest concentration of SARS-CoV-2 viral copies this assay can detect is 131 copies/mL. A negative result does not preclude SARS-Cov-2 infection and should not be used as the sole basis for treatment or other patient management decisions. A negative result may occur with  improper specimen collection/handling, submission of specimen other than nasopharyngeal swab, presence of viral mutation(s) within the areas targeted by this assay, and inadequate number of viral copies (<131 copies/mL). A negative result must be combined with clinical observations, patient history, and epidemiological information. The expected result is Negative. Fact Sheet for Patients:  PinkCheek.be Fact Sheet for Healthcare Providers:  GravelBags.it This test is not yet ap proved or cleared by the [place] FDA and  has been authorized for detection and/or diagnosis of SARS-CoV-2 by FDA under an Emergency Use Authorization (EUA). This EUA will remain  in effect (meaning this test can be used) for the duration of the COVID-19 declaration  under Section 564(b)(1) of the Act, 21 U.S.C. section 360bbb-3(b)(1), unless the authorization is terminated or revoked sooner.    Influenza A by PCR NEGATIVE NEGATIVE Final   Influenza B by PCR NEGATIVE NEGATIVE Final    Comment: (NOTE) The Xpert Xpress SARS-CoV-2/FLU/RSV assay is intended as an aid in  the diagnosis of influenza from Nasopharyngeal swab specimens and  should not be used as a sole basis for treatment. Nasal washings and  aspirates are unacceptable for Xpert Xpress SARS-CoV-2/FLU/RSV  testing. Fact Sheet for Patients: PinkCheek.be Fact Sheet for Healthcare Providers: GravelBags.it This test is not yet approved or cleared by the [place]  States FDA and  has been authorized for detection and/or diagnosis of SARS-CoV-2 by  FDA under an Emergency Use Authorization (EUA). This EUA will remain  in effect (meaning this test can be used) for the duration of the  Covid-19 declaration under Section 564(b)(1) of the Act, 21  U.S.C. section 360bbb-3(b)(1), unless the authorization is  terminated or revoked. Performed at [facility], [address] [place], [place] [address]   MRSA PCR Screening     Status: None   Collection Time: 05/13/19 12:04 AM   Specimen: Nasopharyngeal  Result Value Ref Range Status   MRSA by PCR NEGATIVE NEGATIVE Final    Comment:        The GeneXpert MRSA Assay (FDA approved for NASAL specimens only), is one component of a comprehensive MRSA colonization surveillance program. It is not intended to diagnose MRSA infection nor to guide or monitor treatment for MRSA infections. Performed at [facility], [address] [place], [place] [address]   CULTURE, BLOOD (ROUTINE X 2) w Reflex to ID Panel     Status: None (Preliminary result)   Collection Time: 05/14/19 10:36 AM   Specimen: BLOOD RIGHT ARM  Result Value Ref Range Status   Specimen Description BLOOD RIGHT ARM   Final   Special Requests   Final    BOTTLES DRAWN AEROBIC AND ANAEROBIC Blood Culture adequate volume   Culture   Final    NO GROWTH < 24 HOURS Performed at [facility], [address] [place], [place] [address]    Report Status PENDING  Incomplete  CULTURE, BLOOD (ROUTINE X 2) w Reflex to ID Panel     Status: None (Preliminary result)   Collection Time: 05/14/19 10:43 AM   Specimen: BLOOD LEFT ARM  Result Value Ref Range Status   Specimen Description BLOOD LEFT ARM  Final   Special Requests   Final    BOTTLES DRAWN AEROBIC AND ANAEROBIC Blood Culture adequate volume   Culture   Final    NO GROWTH < 24 HOURS Performed at [facility], [address], [place], [place] [address]    Report Status PENDING  Incomplete         Radiology Studies: PERIPHERAL VASCULAR CATHETERIZATION  Result Date: 05/13/2019 See op note  ECHO TEE  Result Date: 05/15/2019    TRANSESOPHOGEAL ECHO REPORT   Patient Name:   NICKOLAI RINKS Date of Exam: 05/15/2019 Medical Rec #:  [id number]      Height:       59.0 in Accession #:    [id number]     Weight:       163.8 lb Date of Birth:  [birth year]      BSA:          1.694 m Patient Age:    15 years       BP:           204/87 mmHg Patient Gender: M              HR:           85 bpm. Exam Location:  [facility] Procedure: Transesophageal Echo, Color Doppler and Saline Contrast Bubble Study Indications:     R78.81 Bacteremia  History:         Patient has prior history of Echocardiogram examinations. COPD                  and ESRD; Risk Factors:Hypertension, HCL and Diabetes.  Sonographer:  Charmayne Sheer RDCS (AE) Referring Phys:  [id number] Teodoro Spray Diagnosing Phys: Bartholome Bill MD PROCEDURE: TEE procedure time was 18 minutes. The transesophogeal probe was passed without difficulty through the esophogus of the patient. Imaged were obtained with the patient in a left lateral decubitus position. Local oropharyngeal anesthetic was provided with Benzocaine  spray and viscous lidocaine. Sedation performed by performing physician. Image quality was excellent. The patient's vital signs; including heart rate, blood pressure, and oxygen saturation; remained stable throughout the procedure. The patient developed no complications during the procedure. Chordae thickening. No obvious vegetations. IMPRESSIONS  1. Left ventricular ejection fraction, by estimation, is 55 to 60%. The left ventricle has normal function. The left ventricle has no regional wall motion abnormalities.  2. Right ventricular systolic function is normal. The right ventricular size is normal.  3. No left atrial/left atrial appendage thrombus was detected.  4. The mitral valve is grossly normal. Trivial mitral valve regurgitation.  5. The aortic valve is tricuspid. Aortic valve regurgitation is trivial. No aortic stenosis is present. FINDINGS  Left Ventricle: Left ventricular ejection fraction, by estimation, is 55 to 60%. The left ventricle has normal function. The left ventricle has no regional wall motion abnormalities. The left ventricular internal cavity size was normal in size. There is  no left ventricular hypertrophy. Right Ventricle: The right ventricular size is normal. No increase in right ventricular wall thickness. Right ventricular systolic function is normal. Left Atrium: Left atrial size was normal in size. No left atrial/left atrial appendage thrombus was detected. Right Atrium: Right atrial size was normal in size. Pericardium: There is no evidence of pericardial effusion. Mitral Valve: The mitral valve is grossly normal. Normal mobility of the mitral valve leaflets. Trivial mitral valve regurgitation. Tricuspid Valve: The tricuspid valve is grossly normal. Tricuspid valve regurgitation is trivial. Aortic Valve: The aortic valve is tricuspid. Aortic valve regurgitation is trivial. No aortic stenosis is present. Pulmonic Valve: The pulmonic valve was not well visualized. Pulmonic valve  regurgitation is trivial. Aorta: The aortic root is normal in size and structure. IAS/Shunts: No atrial level shunt detected by color flow Doppler. Agitated saline contrast was given intravenously to evaluate for intracardiac shunting. Bartholome Bill MD Electronically signed by Bartholome Bill MD Signature Date/Time: 05/15/2019/9:51:13 AM    Final         Scheduled Meds: . amLODipine  10 mg Oral Daily  . butamben-tetracaine-benzocaine      . carvedilol  12.5 mg Oral BID WC  . citalopram  20 mg Oral Daily  . fentaNYL      . ferric citrate  210 mg Oral TID WC  . heparin  5,000 Units Subcutaneous Q8H  . hydrALAZINE  25 mg Oral Q8H  . lidocaine      . linagliptin  5 mg Oral Daily  . magic mouthwash  10 mL Oral QID  . midazolam      . pravastatin  40 mg Oral q1800  . senna-docusate  2 tablet Oral BID  . sodium chloride flush       Continuous Infusions: . sodium chloride 10 mL/hr at 05/15/19 0833  .  ceFAZolin (ANCEF) IV 1 g (05/14/19 1817)    Assessment & Plan:   Principal Problem:   Sepsis ([place]) Active Problems:   Malignant hypertension   ESRD (end stage renal disease) ([place])   Leucocytosis   Septicemia due to coagulase-negative staphylococcal infection ([place])   #1.  Coag negative staph coccus septicemia. Secondary to line infection.  Permacath  removed.  Repeat blood cultures.  Change antibiotic to cefazolin, probably will treat patient with additional 2 weeks of cefazolin from today's date if repeat cultures negative.  ID following.rec.cefazolin until 06/08/19, weekly cbc, bmp TEE today, neg. For endocarditis  #2.  Neck pain. CT scan of the neck showed degenerative disc disease, no evidence of osteomyelitis.  Most likely due to osteoarthritis.  Symptomatic treatment.  3.  End-stage renal disease. Followed by nephrology, may be able to place another permacath tomorrow if the repeat culture results come back negative  Vascular took permacath out .  4.  Benign prostate  hypertrophy. Continue home medicines.  5.  Essential hypertension.   Continue home medicine.  6.  Constipation.   On Senna, still no bowel movement, add MiraLAX 1 time.    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

## 2019-05-15 NOTE — Procedures (Signed)
   TRANSESOPHAGEAL ECHOCARDIOGRAM   NAME:  Daniel Thomas   MRN: [id number] DOB:  October 22, 1955   ADMIT DATE: 05/12/2019  INDICATIONS:   PROCEDURE:   Informed consent was obtained prior to the procedure. The risks, benefits and alternatives for the procedure were discussed and the patient comprehended these risks.  Risks include, but are not limited to, cough, sore throat, vomiting, nausea, somnolence, esophageal and stomach trauma or perforation, bleeding, low blood pressure, aspiration, pneumonia, infection, trauma to the teeth and death.    After a procedural time-out, the patient was given 3 mg versed and 75 mcg fentanyl to achieve moderate sedation for 18 min.  The oropharynx was anesthetized 3 cc of topical 1% viscous lidocaine.  The transesophageal probe was inserted in the esophagus and stomach without difficulty and multiple views were obtained. I was present for the entire procedure.    COMPLICATIONS:    There were no immediate complications.  FINDINGS:  LEFT VENTRICLE: EF = 55%. No regional wall motion abnormalities.  RIGHT VENTRICLE: Normal size and function.   LEFT ATRIUM: Normal with no thrombus   LEFT ATRIAL APPENDAGE: No thrombus.   RIGHT ATRIUM:Normal. No thrombus  AORTIC VALVE:  Trileaflet. No vegetations noted. Trivial AI  MITRAL VALVE:    Normal. Leaflets mildly thickened. Calcified chordae. No obvious vegetations  TRICUSPID VALVE: Normal. No vegetations. Trivial to mild tr  PULMONIC VALVE: Grossly normal.  INTERATRIAL SEPTUM: No PFO or ASD. Agitated saline contrast was used.   PERICARDIUM: No effusion  DESCENDING AORTA: Normal   CONCLUSION: No obvious vegetations. Thickened chordae.

## 2019-05-15 NOTE — Care Management Important Message (Signed)
Important Message  Patient Details  Name: Daniel Thomas MRN: [id number] Date of Birth: 10/02/1955   Medicare Important Message Given:  Yes     [name] 05/15/2019, 11:12 AM

## 2019-05-15 NOTE — Progress Notes (Signed)
Date of Admission:  05/12/2019       Subjective: Doing better Had TEE this morning. feeling hungry now . No fever or sob   Medications:  . amLODipine  10 mg Oral Daily  . butamben-tetracaine-benzocaine      . carvedilol  12.5 mg Oral BID WC  . citalopram  20 mg Oral Daily  . fentaNYL      . ferric citrate  210 mg Oral TID WC  . heparin  5,000 Units Subcutaneous Q8H  . hydrALAZINE  25 mg Oral Q8H  . lidocaine      . linagliptin  5 mg Oral Daily  . magic mouthwash  10 mL Oral QID  . midazolam      . pravastatin  40 mg Oral q1800  . senna-docusate  2 tablet Oral BID  . sodium chloride flush        Objective: Vital signs in last 24 hours: Temp:  [97.4 F (36.3 C)-98.5 F (36.9 C)] 97.4 F (36.3 C) (05/12 1036) Pulse Rate:  [60-88] 67 (05/12 1036) Resp:  [11-19] 15 (05/12 1036) BP: (101-204)/(65-105) 156/80 (05/12 1036) SpO2:  [94 %-100 %] 100 % (05/12 1036) Weight:  [74.3 kg] 74.3 kg (05/12 0459)  PHYSICAL EXAM:  General: Alert, cooperative, no distress, appears stated age.  Neck: Supple, symmetrical, no adenopathy, thyroid: non tender no carotid bruit and no JVD. Back: No CVA tenderness. Lungs: Clear to auscultation bilaterally. No Wheezing or Rhonchi. No rales. Heart: Regular rate and rhythm, no murmur, rub or gallop. Abdomen: Soft, non-tender,not distended. Bowel sounds normal. No masses Extremities: atraumatic, no cyanosis. No edema. No clubbing Skin: chest wall around the old permacath site has hyperpigmentation, scaling and superficial erosions    Lymph: Cervical, supraclavicular normal. Neurologic: Grossly non-focal  Lab Results Recent Labs    05/14/19 0519 05/15/19 0552  WBC 15.1* 10.9*  HGB 10.7* 10.4*  HCT 31.5* 29.9*  NA 132* 129*  K 3.6 3.7  CL 93* 90*  CO2 25 24  BUN 40* 57*  CREATININE 6.32* 7.81*   Liver Panel Recent Labs    05/12/19 1809 05/13/19 0547  PROT 7.5 7.0  ALBUMIN 4.0 3.5  AST 23 20  ALT 23 20  ALKPHOS 117 99    BILITOT 1.0 0.7  Microbiology: 05/12/19 BC- staph lugdunensis 05/14/19 - BC NG  Studies/Results: PERIPHERAL VASCULAR CATHETERIZATION  Result Date: 05/13/2019 See op note  ECHO TEE  Result Date: 05/15/2019    TRANSESOPHOGEAL ECHO REPORT   Patient Name:   Daniel Thomas Date of Exam: 05/15/2019 Medical Rec #:  [id number]      Height:       59.0 in Accession #:    [id number]     Weight:       163.8 lb Date of Birth:  August 28, 1955      BSA:          1.694 m Patient Age:    64 years       BP:           204/87 mmHg Patient Gender: M              HR:           85 bpm. Exam Location:  [facility] Procedure: Transesophageal Echo, Color Doppler and Saline Contrast Bubble Study Indications:     R78.81 Bacteremia  History:         Patient has prior history of Echocardiogram examinations. COPD  and ESRD; Risk Factors:Hypertension, HCL and Diabetes.  Sonographer:     Charmayne Sheer RDCS (AE) Referring Phys:  [place] Diagnosing Phys: Bartholome Bill MD PROCEDURE: TEE procedure time was 18 minutes. The transesophogeal probe was passed without difficulty through the esophogus of the patient. Imaged were obtained with the patient in a left lateral decubitus position. Local oropharyngeal anesthetic was provided with Benzocaine spray and viscous lidocaine. Sedation performed by performing physician. Image quality was excellent. The patient's vital signs; including heart rate, blood pressure, and oxygen saturation; remained stable throughout the procedure. The patient developed no complications during the procedure. Chordae thickening. No obvious vegetations. IMPRESSIONS  1. Left ventricular ejection fraction, by estimation, is 55 to 60%. The left ventricle has normal function. The left ventricle has no regional wall motion abnormalities.  2. Right ventricular systolic function is normal. The right ventricular size is normal.  3. No left atrial/left atrial appendage thrombus was detected.  4. The mitral valve is  grossly normal. Trivial mitral valve regurgitation.  5. The aortic valve is tricuspid. Aortic valve regurgitation is trivial. No aortic stenosis is present. FINDINGS  Left Ventricle: Left ventricular ejection fraction, by estimation, is 55 to 60%. The left ventricle has normal function. The left ventricle has no regional wall motion abnormalities. The left ventricular internal cavity size was normal in size. There is  no left ventricular hypertrophy. Right Ventricle: The right ventricular size is normal. No increase in right ventricular wall thickness. Right ventricular systolic function is normal. Left Atrium: Left atrial size was normal in size. No left atrial/left atrial appendage thrombus was detected. Right Atrium: Right atrial size was normal in size. Pericardium: There is no evidence of pericardial effusion. Mitral Valve: The mitral valve is grossly normal. Normal mobility of the mitral valve leaflets. Trivial mitral valve regurgitation. Tricuspid Valve: The tricuspid valve is grossly normal. Tricuspid valve regurgitation is trivial. Aortic Valve: The aortic valve is tricuspid. Aortic valve regurgitation is trivial. No aortic stenosis is present. Pulmonic Valve: The pulmonic valve was not well visualized. Pulmonic valve regurgitation is trivial. Aorta: The aortic root is normal in size and structure. IAS/Shunts: No atrial level shunt detected by color flow Doppler. Agitated saline contrast was given intravenously to evaluate for intracardiac shunting. Bartholome Bill MD Electronically signed by Bartholome Bill MD Signature Date/Time: 05/15/2019/9:51:13 AM    Final      Assessment/Plan: ?64 year old male with history of end-stage renal disease on dialysis through permacath is admitted with fever chills and neck pain.  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  Source is the permacath which has been removed. Repeat blood culture from 05/14/19 neg so far MRI cervical spine shows No evidence for  osteomyelitis discitis or septic arthritis within the cervical spine ? TEE is negative for endocarditis. Will treat patient with IV cefazolin until 06/08/19 which can be administered  during dialysis New permacath can be placed if repeat culture from 5/11 remains negative tomorrow   Contact dermatitis at the site of the permacath.  This is very likely due to the dressing he had before.  Diabetes mellitus management as per primary team  Discussed the management with the care team

## 2019-05-15 NOTE — Consult Note (Signed)
Cardiology Consultation Note    Patient ID: [name], MRN: [id number], DOB/AGE: 1955-05-31 64 y.o. Admit date: 05/12/2019   Date of Consult: 05/15/2019 Primary Physician: Daniel Merles, MD Primary Cardiologist:    Chief Complaint: weakness Reason for Consultation: bacteremia for tee Requesting MD: Dr. Roosevelt Thomas  HPI: Daniel Thomas is a 64 y.o. male with history of copd, esrd on hd, hyperlipidemia and htn with history of mild cardiomyopathy with ef of 40% by echo in 2017 with lvh andmild mr and tr, who was admitted with bacteremia. Temp hd catheter removed and placed on abx. Asked to proceeed with tee to evaluate for evidence ov vegetations.    Past Medical History:  Diagnosis Date  . COPD (chronic obstructive pulmonary disease) ([place])   . Diabetes mellitus without complication ([place])   . Enlarged prostate   . ESRD on dialysis ([place])   . Heart murmur   . High cholesterol   . Hypertension       Surgical History:  Past Surgical History:  Procedure Laterality Date  . DIALYSIS/PERMA CATHETER INSERTION N/A 01/12/2018   Procedure: DIALYSIS/PERMA CATHETER INSERTION;  Surgeon: Daniel Cabal, MD;  Location: [facility];  Service: Cardiovascular;  Laterality: N/A;  . DIALYSIS/PERMA CATHETER REMOVAL N/A 05/13/2019   Procedure: DIALYSIS/PERMA CATHETER REMOVAL;  Surgeon: Daniel Huxley, MD;  Location: [place] [facility];  Service: Cardiovascular;  Laterality: N/A;     Home Meds: Prior to Admission medications   Medication Sig Start Date End Date Taking? Authorizing Provider  amLODipine (NORVASC) 10 MG tablet Take 1 tablet (10 mg total) by mouth daily. 01/08/18  Yes Daniel Grayer, MD  atorvastatin (LIPITOR) 40 MG tablet  04/29/19  Yes [provider]  carvedilol (COREG) 12.5 MG tablet Take 12.5 mg by mouth 2 (two) times daily with a meal. 03/31/18  Yes [provider]  citalopram (CELEXA) 20 MG tablet Take 20 mg by mouth daily.   Yes [provider]   ergocalciferol (DRISDOL) 1.25 MG (50000 UT) capsule Take by mouth. Administered at analysis 02/02/18  Yes [provider]  feeding supplement, [place], ([place]) [facility] 04/29/19  Yes [provider]  ferric citrate (AURYXIA) 1 GM 210 MG(Fe) tablet Take by mouth. 04/29/19  Yes [provider]  hydrALAZINE (APRESOLINE) 50 MG tablet  04/29/19  Yes [provider]  linagliptin (TRADJENTA) 5 MG TABS tablet Take 1 tablet (5 mg total) by mouth daily. 01/09/18  Yes Daniel Grayer, MD  Methoxy PEG-Epoetin Beta (MIRCERA IJ) Mircera 01/08/19 03/03/20 Yes [provider]  metoprolol tartrate (LOPRESSOR) 50 MG tablet Take 1 tablet (50 mg total) by mouth 2 (two) times daily. 01/08/18  Yes Daniel Grayer, MD  torsemide (DEMADEX) 20 MG tablet 2 tablets po daily 01/08/18  Yes Wieting, Richard, MD  traZODone (DESYREL) 50 MG tablet Take 25 mg by mouth at bedtime as needed for sleep. 03/18/18  Yes [provider]    Inpatient Medications:  . amLODipine  10 mg Oral Daily  . carvedilol  12.5 mg Oral BID WC  . citalopram  20 mg Oral Daily  . ferric citrate  210 mg Oral TID WC  . heparin  5,000 Units Subcutaneous Q8H  . hydrALAZINE  25 mg Oral Q8H  . linagliptin  5 mg Oral Daily  . magic mouthwash  10 mL Oral QID  . pravastatin  40 mg Oral q1800  . senna-docusate  2 tablet Oral BID   .  sodium chloride 10 mL/hr at 05/13/19 2000  .  ceFAZolin (ANCEF) IV 1 g (05/14/19 1817)    Allergies: No Known Allergies  Social History   Socioeconomic History  . Marital status: Single    Spouse name: Not on file  . Number of children: Not on file  . Years of education: Not on file  . Highest education level: Not on file  Occupational History  . Not on file  Tobacco Use  . Smoking status: Former Smoker    Years: 20.00  . Smokeless tobacco: Never Used  Substance and Sexual Activity  . Alcohol use: Never  . Drug use: Never  . Sexual activity:  Not Currently  Other Topics Concern  . Not on file  Social History Narrative   Lives home alone, 3 aunts that live next door and brother 1 mile away. Helps commute to appointments.   Social Determinants of Health   Financial Resource Strain:   . Difficulty of Paying Living Expenses:   Food Insecurity:   . Worried About Charity fundraiser in the Last Year:   . Arboriculturist in the Last Year:   Transportation Needs:   . Film/video editor (Medical):   Marland Kitchen Lack of Transportation (Non-Medical):   Physical Activity:   . Days of Exercise per Week:   . Minutes of Exercise per Session:   Stress:   . Feeling of Stress :   Social Connections:   . Frequency of Communication with Friends and Family:   . Frequency of Social Gatherings with Friends and Family:   . Attends Religious Services:   . Active Member of Clubs or Organizations:   . Attends Archivist Meetings:   Marland Kitchen Marital Status:   Intimate Partner Violence:   . Fear of Current or Ex-Partner:   . Emotionally Abused:   Marland Kitchen Physically Abused:   . Sexually Abused:      History reviewed. No pertinent family history.   Review of Systems: A 12-system review of systems was performed and is negative except as noted in the HPI.  Labs: No results for input(s): CKTOTAL, CKMB, TROPONINI in the last 72 hours. Lab Results  Component Value Date   WBC 15.1 (H) 05/14/2019   HGB 10.7 (L) 05/14/2019   HCT 31.5 (L) 05/14/2019   MCV 88.5 05/14/2019   PLT 263 05/14/2019    Recent Labs  Lab 05/13/19 0547 05/13/19 0547 05/14/19 0519  NA 133*   < > 132*  K 3.5   < > 3.6  CL 93*   < > 93*  CO2 25   < > 25  BUN 57*   < > 40*  CREATININE 8.12*   < > 6.32*  CALCIUM 9.2   < > 9.0  PROT 7.0  --   --   BILITOT 0.7  --   --   ALKPHOS 99  --   --   ALT 20  --   --   AST 20  --   --   GLUCOSE 121*   < > 109*   < > = values in this interval not displayed.   Lab Results  Component Value Date   CHOL 210 (H) 01/07/2018   HDL  50 01/07/2018   LDLCALC 139 (H) 01/07/2018   TRIG 107 01/07/2018   No results found for: DDIMER  Radiology/Studies:  MR CERVICAL SPINE WO CONTRAST  Result Date: 05/13/2019 CLINICAL DATA:  Daniel Thomas initial evaluation for acute sepsis, concern  for discitis. EXAM: MRI CERVICAL SPINE WITHOUT CONTRAST TECHNIQUE: Multiplanar, multisequence MR imaging of the cervical spine was performed. No intravenous contrast was administered. COMPARISON:  None available. FINDINGS: Alignment: Straightening of the normal cervical lordosis. No listhesis or subluxation. Vertebrae: Vertebral body height maintained without evidence for acute or chronic fracture. Bone marrow signal intensity within normal limits. No discrete or worrisome osseous lesions. Discogenic reactive endplate changes present about the C3-4 interspace. No other abnormal marrow edema. No findings to suggest osteomyelitis discitis or septic arthritis. Cord: Signal intensity within the cervical spinal cord is normal. No epidural collections identified. Posterior Fossa, vertebral arteries, paraspinal tissues: Note made of a empty sella. Visualized brain and posterior fossa otherwise unremarkable. Sphenoid sinus disease partially visualized. Craniocervical junction normal. Paraspinous and prevertebral soft tissues within normal limits. Normal flow voids seen within the vertebral arteries bilaterally. Disc levels: C2-C3: Mild uncovertebral hypertrophy without significant disc bulge. No canal or foraminal stenosis. C3-C4: Chronic intervertebral disc space narrowing with diffuse degenerative disc osteophyte. Flattening and effacement of the ventral thecal sac with resultant moderate spinal stenosis. Mild cord flattening without cord signal changes. Severe bilateral C4 foraminal stenosis. C4-C5: Mild disc bulge with uncovertebral hypertrophy. No significant spinal stenosis. Mild bilateral C5 foraminal narrowing. C5-C6: Mild disc bulge with uncovertebral spurring. No  significant spinal stenosis. Mild left C6 foraminal stenosis. No significant right foraminal narrowing. C6-C7: Normal interspace. Mild facet degeneration on the right. No canal or foraminal stenosis. C7-T1:  Unremarkable. Visualized upper thoracic spine demonstrates no significant finding. IMPRESSION: 1. No evidence for osteomyelitis discitis or septic arthritis within the cervical spine. No other acute infection identified. 2. Degenerative disc osteophyte at C3-4 with resultant moderate canal with severe bilateral C4 foraminal stenosis. 3. Mild bilateral C5 and left C6 foraminal narrowing related to disc bulge and uncovertebral disease. Electronically Signed   By: Jeannine Boga M.D.   On: 05/13/2019 03:11   PERIPHERAL VASCULAR CATHETERIZATION  Result Date: 05/13/2019 See op note  DG Chest Portable 1 View  Result Date: 05/12/2019 CLINICAL DATA:  Sepsis EXAM: PORTABLE CHEST 1 VIEW COMPARISON:  04/17/2018 FINDINGS: There is a well-positioned tunneled dialysis catheter on the right. The heart size is stable. There are few hazy airspace opacities in the lung bases bilaterally favored to represent areas of atelectasis. There is no pneumothorax. No pleural effusion. There is mild vascular congestion without overt pulmonary edema. There is no acute osseous abnormality. IMPRESSION: No active disease. Electronically Signed   By: Constance Holster M.D.   On: 05/12/2019 18:54    Wt Readings from Last 3 Encounters:  05/15/19 74.3 kg  08/29/18 75.3 kg  04/17/18 68.2 kg    EKG: sinus tachycardia  Physical Exam:  Blood pressure (!) 144/73, pulse 81, temperature 98.1 F (36.7 C), temperature source Oral, resp. rate 19, height 4\' 11"  (1.499 m), weight 74.3 kg, SpO2 99 %. Body mass index is 33.08 kg/m. General: Well developed, well nourished, in no acute distress. Head: Normocephalic, atraumatic, sclera non-icteric, no xanthomas, nares are without discharge.  Neck: Negative for carotid bruits. JVD not  elevated. Lungs: Clear bilaterally to auscultation without wheezes, rales, or rhonchi. Breathing is unlabored. Heart: RRR with S1 S2. No murmurs, rubs, or gallops appreciated. Abdomen: Soft, non-tender, non-distended with normoactive bowel sounds. No hepatomegaly. No rebound/guarding. No obvious abdominal masses. Msk:  Strength and tone appear normal for age. Extremities: No clubbing or cyanosis. No edema.  Distal pedal pulses are 2+ and equal bilaterally. Neuro: Alert and oriented X 3. No facial  asymmetry. No focal deficit. Moves all extremities spontaneously. Psych:  Responds to questions appropriately with a normal affect.     Assessment and Plan  Pt with esrd on hd with bacteremia. HD catheter removed and placed on abx. Transthoracic  Echo in 2020 showed lvh and mild mr. TTE not completed this admission as of yet. Discussed risk and benefits of tee with patient and his brother. Pt and his brother are not sure they wish to proceed. Will proceed with tee if patient consents.   Signed, Teodoro Spray MD 05/15/2019, 6:10 AM Pager: [phone]

## 2019-05-15 NOTE — Progress Notes (Signed)
*  PRELIMINARY RESULTS* Echocardiogram Echocardiogram Transesophageal has been performed.  Daniel Thomas 05/15/2019, 9:26 AM

## 2019-05-15 NOTE — Progress Notes (Signed)
[facility]  ROUNDING NOTE   Subjective:  Patient had TEE today. This was negative for endocarditis. Repeat cultures thus far negative but still less than 24 hours.   Objective:  Vital signs in last 24 hours:  Temp:  [97.4 F (36.3 C)-98.1 F (36.7 C)] 97.4 F (36.3 C) (05/12 1204) Pulse Rate:  [60-88] 63 (05/12 1204) Resp:  [11-18] 14 (05/12 1204) BP: (101-204)/(65-105) 152/76 (05/12 1204) SpO2:  [94 %-100 %] 97 % (05/12 1204) Weight:  [74.3 kg] 74.3 kg (05/12 0459)  Weight change:  Filed Weights   05/12/19 1802 05/13/19 0500 05/15/19 0459  Weight: 76.2 kg 73.1 kg 74.3 kg    Intake/Output: I/O last 3 completed shifts: In: 949.2 [P.O.:720; I.V.:129.2; IV Piggyback:100] Out: 550 [Urine:550]   Intake/Output this shift:  No intake/output data recorded.  Physical Exam: General: No acute distress  Head: Normocephalic, atraumatic. Moist oral mucosal membranes  Eyes: Anicteric  Neck: Supple, trachea midline  Lungs:  Clear to auscultation, normal effort  Heart: S1S2 no rubs  Abdomen:  Soft, nontender, bowel sounds present  Extremities: Trace peripheral edema.  Neurologic: Resting comfortably  Skin: No lesions  Access: None    Basic Metabolic Panel: Recent Labs  Lab 05/12/19 1809 05/12/19 1809 05/13/19 0547 05/14/19 0519 05/15/19 0552  NA 133*  --  133* 132* 129*  K 3.6  --  3.5 3.6 3.7  CL 94*  --  93* 93* 90*  CO2 21*  --  25 25 24   GLUCOSE 120*  --  121* 109* 104*  BUN 52*  --  57* 40* 57*  CREATININE 7.23*  --  8.12* 6.32* 7.81*  CALCIUM 9.4   < > 9.2 9.0 8.9   < > = values in this interval not displayed.    Liver Function Tests: Recent Labs  Lab 05/12/19 1809 05/13/19 0547  AST 23 20  ALT 23 20  ALKPHOS 117 99  BILITOT 1.0 0.7  PROT 7.5 7.0  ALBUMIN 4.0 3.5   No results for input(s): LIPASE, AMYLASE in the last 168 hours. No results for input(s): AMMONIA in the last 168 hours.  CBC: Recent Labs  Lab 05/12/19 1809  05/13/19 0547 05/14/19 0519 05/15/19 0552  WBC 21.4* 27.4* 15.1* 10.9*  NEUTROABS 19.3*  --  11.6* 7.3  HGB 11.7* 10.7* 10.7* 10.4*  HCT 35.1* 30.6* 31.5* 29.9*  MCV 89.1 86.9 88.5 86.2  PLT 316 273 263 210    Cardiac Enzymes: No results for input(s): CKTOTAL, CKMB, CKMBINDEX, TROPONINI in the last 168 hours.  BNP: Invalid input(s): POCBNP  CBG: No results for input(s): GLUCAP in the last 168 hours.  Microbiology: Results for orders placed or performed during the hospital encounter of 05/12/19  Culture, blood (Routine x 2)     Status: Abnormal   Collection Time: 05/12/19  6:09 PM   Specimen: BLOOD  Result Value Ref Range Status   Specimen Description   Final    BLOOD LEFT FA Performed at [facility], [address], [place], [place] [address]    Special Requests   Final    BOTTLES DRAWN AEROBIC AND ANAEROBIC Blood Culture adequate volume Performed at [facility], [address] [place], [place] [address]    Culture  Setup Time   Final    GRAM POSITIVE COCCI IN BOTH AEROBIC AND ANAEROBIC BOTTLES CRITICAL VALUE NOTED.  VALUE IS CONSISTENT WITH PREVIOUSLY REPORTED AND CALLED VALUE. Performed at [facility], [address] [place],  [place] [address]    Culture (A)  Final    STAPHYLOCOCCUS LUGDUNENSIS SUSCEPTIBILITIES PERFORMED ON PREVIOUS CULTURE WITHIN THE LAST 5 DAYS. Performed at [facility], [place] [address], [place], [place] [address]    Report Status 05/15/2019 FINAL  Final  Culture, blood (Routine x 2)     Status: Abnormal   Collection Time: 05/12/19  6:09 PM   Specimen: BLOOD  Result Value Ref Range Status   Specimen Description   Final    BLOOD L HAND Performed at [facility], [address] [place], [place] [address]    Special Requests   Final    BOTTLES DRAWN AEROBIC AND ANAEROBIC Blood Culture adequate volume Performed at [facility], [address] [place], [place] [address]    Culture   Setup Time   Final    GRAM POSITIVE COCCI IN BOTH AEROBIC AND ANAEROBIC BOTTLES CRITICAL RESULT CALLED TO, READ BACK BY AND VERIFIED WITH: CHRISTINE KATSOUDAS AT 3419 ON 05/13/19 SNG Performed at [facility], [place] [address], [place], [place] [address]    Culture STAPHYLOCOCCUS LUGDUNENSIS (A)  Final   Report Status 05/15/2019 FINAL  Final   Organism ID, Bacteria STAPHYLOCOCCUS LUGDUNENSIS  Final      Susceptibility   Staphylococcus lugdunensis - MIC*    CIPROFLOXACIN <=0.5 SENSITIVE Sensitive     ERYTHROMYCIN <=0.25 SENSITIVE Sensitive     GENTAMICIN <=0.5 SENSITIVE Sensitive     OXACILLIN 2 SENSITIVE Sensitive     TETRACYCLINE <=1 SENSITIVE Sensitive     VANCOMYCIN <=0.5 SENSITIVE Sensitive     TRIMETH/SULFA <=10 SENSITIVE Sensitive     CLINDAMYCIN <=0.25 SENSITIVE Sensitive     RIFAMPIN <=0.5 SENSITIVE Sensitive     Inducible Clindamycin NEGATIVE Sensitive     * STAPHYLOCOCCUS LUGDUNENSIS  Blood Culture ID Panel (Reflexed)     Status: Abnormal   Collection Time: 05/12/19  6:09 PM  Result Value Ref Range Status   Enterococcus species NOT DETECTED NOT DETECTED Final   Listeria monocytogenes NOT DETECTED NOT DETECTED Final   Staphylococcus species DETECTED (A) NOT DETECTED Final    Comment: Methicillin (oxacillin) susceptible coagulase negative staphylococcus. Possible blood culture contaminant (unless isolated from more than one blood culture draw or clinical case suggests pathogenicity). No antibiotic treatment is indicated for blood  culture contaminants. CRITICAL RESULT CALLED TO, READ BACK BY AND VERIFIED WITH: CHRISTINE KATSOUDAS AT 4097 ON 05/13/19 SNG    Staphylococcus aureus (BCID) NOT DETECTED NOT DETECTED Final   Methicillin resistance NOT DETECTED NOT DETECTED Final   Streptococcus species NOT DETECTED NOT DETECTED Final   Streptococcus agalactiae NOT DETECTED NOT DETECTED Final   Streptococcus pneumoniae NOT DETECTED NOT DETECTED Final   Streptococcus pyogenes  NOT DETECTED NOT DETECTED Final   Acinetobacter baumannii NOT DETECTED NOT DETECTED Final   Enterobacteriaceae species NOT DETECTED NOT DETECTED Final   Enterobacter cloacae complex NOT DETECTED NOT DETECTED Final   Escherichia coli NOT DETECTED NOT DETECTED Final   Klebsiella oxytoca NOT DETECTED NOT DETECTED Final   Klebsiella pneumoniae NOT DETECTED NOT DETECTED Final   Proteus species NOT DETECTED NOT DETECTED Final   Serratia marcescens NOT DETECTED NOT DETECTED Final   Haemophilus influenzae NOT DETECTED NOT DETECTED Final   Neisseria meningitidis NOT DETECTED NOT DETECTED Final   Pseudomonas aeruginosa NOT DETECTED NOT DETECTED Final   Candida albicans NOT DETECTED NOT DETECTED Final   Candida glabrata NOT DETECTED NOT DETECTED Final   Candida krusei NOT DETECTED NOT  DETECTED Final   Candida parapsilosis NOT DETECTED NOT DETECTED Final   Candida tropicalis NOT DETECTED NOT DETECTED Final    Comment: Performed at [facility], [address] [place], [place] [address]  Respiratory Panel by RT PCR (Flu A&B, Covid) - Nasopharyngeal Swab     Status: None   Collection Time: 05/12/19  6:10 PM   Specimen: Nasopharyngeal Swab  Result Value Ref Range Status   SARS Coronavirus 2 by RT PCR NEGATIVE NEGATIVE Final    Comment: (NOTE) SARS-CoV-2 target nucleic acids are NOT DETECTED. The SARS-CoV-2 RNA is generally detectable in upper respiratoy specimens during the acute phase of infection. The lowest concentration of SARS-CoV-2 viral copies this assay can detect is 131 copies/mL. A negative result does not preclude SARS-Cov-2 infection and should not be used as the sole basis for treatment or other patient management decisions. A negative result may occur with  improper specimen collection/handling, submission of specimen other than nasopharyngeal swab, presence of viral mutation(s) within the areas targeted by this assay, and inadequate number of viral copies (<131  copies/mL). A negative result must be combined with clinical observations, patient history, and epidemiological information. The expected result is Negative. Fact Sheet for Patients:  PinkCheek.be Fact Sheet for Healthcare Providers:  GravelBags.it This test is not yet ap proved or cleared by the [place] FDA and  has been authorized for detection and/or diagnosis of SARS-CoV-2 by FDA under an Emergency Use Authorization (EUA). This EUA will remain  in effect (meaning this test can be used) for the duration of the COVID-19 declaration under Section 564(b)(1) of the Act, 21 U.S.C. section 360bbb-3(b)(1), unless the authorization is terminated or revoked sooner.    Influenza A by PCR NEGATIVE NEGATIVE Final   Influenza B by PCR NEGATIVE NEGATIVE Final    Comment: (NOTE) The Xpert Xpress SARS-CoV-2/FLU/RSV assay is intended as an aid in  the diagnosis of influenza from Nasopharyngeal swab specimens and  should not be used as a sole basis for treatment. Nasal washings and  aspirates are unacceptable for Xpert Xpress SARS-CoV-2/FLU/RSV  testing. Fact Sheet for Patients: PinkCheek.be Fact Sheet for Healthcare Providers: GravelBags.it This test is not yet approved or cleared by the [place] FDA and  has been authorized for detection and/or diagnosis of SARS-CoV-2 by  FDA under an Emergency Use Authorization (EUA). This EUA will remain  in effect (meaning this test can be used) for the duration of the  Covid-19 declaration under Section 564(b)(1) of the Act, 21  U.S.C. section 360bbb-3(b)(1), unless the authorization is  terminated or revoked. Performed at [facility], [address] [place], [place] [address]   MRSA PCR Screening     Status: None   Collection Time: 05/13/19 12:04 AM   Specimen: Nasopharyngeal  Result Value Ref Range Status   MRSA by  PCR NEGATIVE NEGATIVE Final    Comment:        The GeneXpert MRSA Assay (FDA approved for NASAL specimens only), is one component of a comprehensive MRSA colonization surveillance program. It is not intended to diagnose MRSA infection nor to guide or monitor treatment for MRSA infections. Performed at [facility], [address] [place], [place] [address]   CULTURE, BLOOD (ROUTINE X 2) w Reflex to ID Panel     Status: None (Preliminary result)   Collection Time: 05/14/19 10:36 AM   Specimen: BLOOD RIGHT ARM  Result Value Ref Range Status   Specimen Description BLOOD RIGHT ARM  Final  Special Requests   Final    BOTTLES DRAWN AEROBIC AND ANAEROBIC Blood Culture adequate volume   Culture   Final    NO GROWTH < 24 HOURS Performed at [facility], [address] [place], [place] [address]    Report Status PENDING  Incomplete  CULTURE, BLOOD (ROUTINE X 2) w Reflex to ID Panel     Status: None (Preliminary result)   Collection Time: 05/14/19 10:43 AM   Specimen: BLOOD LEFT ARM  Result Value Ref Range Status   Specimen Description BLOOD LEFT ARM  Final   Special Requests   Final    BOTTLES DRAWN AEROBIC AND ANAEROBIC Blood Culture adequate volume   Culture   Final    NO GROWTH < 24 HOURS Performed at [facility], [address] [place],  [address]    Report Status PENDING  Incomplete    Coagulation Studies: No results for input(s): LABPROT, INR in the last 72 hours.  Urinalysis: No results for input(s): COLORURINE, LABSPEC, PHURINE, GLUCOSEU, HGBUR, BILIRUBINUR, KETONESUR, PROTEINUR, UROBILINOGEN, NITRITE, LEUKOCYTESUR in the last 72 hours.  Invalid input(s): APPERANCEUR    Imaging: ECHO TEE  Result Date: 05/15/2019    TRANSESOPHOGEAL ECHO REPORT   Patient Name:   Daniel Thomas Date of Exam: 05/15/2019 Medical Rec #:  [id number]      Height:       59.0 in Accession #:    [id number]     Weight:       163.8 lb Date of Birth:   1955/12/02      BSA:          1.694 m Patient Age:    50 years       BP:           204/87 mmHg Patient Gender: M              HR:           85 bpm. Exam Location:  [facility] Procedure: Transesophageal Echo, Color Doppler and Saline Contrast Bubble Study Indications:     R78.81 Bacteremia  History:         Patient has prior history of Echocardiogram examinations. COPD                  and ESRD; Risk Factors:Hypertension, HCL and Diabetes.  Sonographer:     Charmayne Sheer RDCS (AE) Referring Phys:  [place] Diagnosing Phys: Bartholome Bill MD PROCEDURE: TEE procedure time was 18 minutes. The transesophogeal probe was passed without difficulty through the esophogus of the patient. Imaged were obtained with the patient in a left lateral decubitus position. Local oropharyngeal anesthetic was provided with Benzocaine spray and viscous lidocaine. Sedation performed by performing physician. Image quality was excellent. The patient's vital signs; including heart rate, blood pressure, and oxygen saturation; remained stable throughout the procedure. The patient developed no complications during the procedure. Chordae thickening. No obvious vegetations. IMPRESSIONS  1. Left ventricular ejection fraction, by estimation, is 55 to 60%. The left ventricle has normal function. The left ventricle has no regional wall motion abnormalities.  2. Right ventricular systolic function is normal. The right ventricular size is normal.  3. No left atrial/left atrial appendage thrombus was detected.  4. The mitral valve is grossly normal. Trivial mitral valve regurgitation.  5. The aortic valve is tricuspid. Aortic valve regurgitation is trivial. No aortic stenosis is present. FINDINGS  Left Ventricle: Left ventricular ejection fraction, by estimation, is 55 to 60%.  The left ventricle has normal function. The left ventricle has no regional wall motion abnormalities. The left ventricular internal cavity size was normal in size. There is  no  left ventricular hypertrophy. Right Ventricle: The right ventricular size is normal. No increase in right ventricular wall thickness. Right ventricular systolic function is normal. Left Atrium: Left atrial size was normal in size. No left atrial/left atrial appendage thrombus was detected. Right Atrium: Right atrial size was normal in size. Pericardium: There is no evidence of pericardial effusion. Mitral Valve: The mitral valve is grossly normal. Normal mobility of the mitral valve leaflets. Trivial mitral valve regurgitation. Tricuspid Valve: The tricuspid valve is grossly normal. Tricuspid valve regurgitation is trivial. Aortic Valve: The aortic valve is tricuspid. Aortic valve regurgitation is trivial. No aortic stenosis is present. Pulmonic Valve: The pulmonic valve was not well visualized. Pulmonic valve regurgitation is trivial. Aorta: The aortic root is normal in size and structure. IAS/Shunts: No atrial level shunt detected by color flow Doppler. Agitated saline contrast was given intravenously to evaluate for intracardiac shunting. Bartholome Bill MD Electronically signed by Bartholome Bill MD Signature Date/Time: 05/15/2019/9:51:13 AM    Final      Medications:   . sodium chloride 10 mL/hr at 05/15/19 7341  .  ceFAZolin (ANCEF) IV Stopped (05/15/19 1826)   . amLODipine  10 mg Oral Daily  . carvedilol  12.5 mg Oral BID WC  . citalopram  20 mg Oral Daily  . ferric citrate  210 mg Oral TID WC  . heparin  5,000 Units Subcutaneous Q8H  . hydrALAZINE  25 mg Oral Q8H  . linagliptin  5 mg Oral Daily  . magic mouthwash  10 mL Oral QID  . pravastatin  40 mg Oral q1800  . senna-docusate  2 tablet Oral BID   sodium chloride, acetaminophen **OR** acetaminophen, aspirin-acetaminophen-caffeine, calcium carbonate, camphor-menthol **AND** hydrOXYzine, feeding supplement (NEPRO CARB STEADY), ondansetron **OR** ondansetron (ZOFRAN) IV, sorbitol, traZODone  Assessment/ Plan:  64 y.o. male with past medical  history of COPD, diabetes mellitus type 2, BPH, hypertension, hyperlipidemia, ESRD on HD TTS, anemia of chronic kidney disease, secondary hyperparathyroidism who was admitted with fever and suspected sepsis.  [facility] [address]/TTS  1.  ESRD on HD TTS.  Patient remains catheter free.  Consider placing PermCath on Friday.  Repeat cultures from yesterday negative but still less than 24 hours.  2.  Fever with suspected sepsis.  PermCath removed.  TEE negative.  Continue cefazolin.  Consider placing PermCath on Friday.  3.  Anemia of chronic kidney disease.  Consider restarting Epogen once back on dialysis.  4.  Secondary hyperparathyroidism.  Check serum phosphorus with next dialysis treatment.  Otherwise maintain the patient on Auryxia 1 tablet p.o. 3 times daily with meals.   LOS: 3 Kauan Kloosterman 5/12/20218:58 PM

## 2019-05-16 ENCOUNTER — Other Ambulatory Visit (INDEPENDENT_AMBULATORY_CARE_PROVIDER_SITE_OTHER): Payer: Self-pay | Admitting: Vascular Surgery

## 2019-05-16 ENCOUNTER — Encounter
Admission: EM | Disposition: A | Discharge status: Home / Self Care | Payer: Self-pay | Source: Home / Self Care | Attending: Internal Medicine

## 2019-05-16 DIAGNOSIS — E871 Hypo-osmolality and hyponatremia: Secondary | ICD-10-CM

## 2019-05-16 DIAGNOSIS — N185 Chronic kidney disease, stage 5: Secondary | ICD-10-CM

## 2019-05-16 HISTORY — PX: DIALYSIS/PERMA CATHETER INSERTION: CATH118288

## 2019-05-16 LAB — GLUCOSE, CAPILLARY
Glucose-Capillary: 107 mg/dL — ABNORMAL HIGH (ref 70–99)
Glucose-Capillary: 119 mg/dL — ABNORMAL HIGH (ref 70–99)
Glucose-Capillary: 118 mg/dL — ABNORMAL HIGH (ref 70–99)

## 2019-05-16 SURGERY — DIALYSIS/PERMA CATHETER INSERTION
Anesthesia: Choice

## 2019-05-16 MED ORDER — FENTANYL CITRATE (PF) 100 MCG/2ML IJ SOLN
INTRAMUSCULAR | Status: DC | PRN
  Administered 2019-05-16 (×2): 50 ug via INTRAVENOUS

## 2019-05-16 MED ORDER — HEPARIN SODIUM (PORCINE) 10000 UNIT/ML IJ SOLN
INTRAMUSCULAR | Status: AC
  Filled 2019-05-16: qty 1

## 2019-05-16 MED ORDER — FENTANYL CITRATE (PF) 100 MCG/2ML IJ SOLN
INTRAMUSCULAR | Status: AC
  Filled 2019-05-16: qty 2

## 2019-05-16 MED ORDER — SODIUM CHLORIDE 0.9 % IV SOLN: 100.0000 mL | INTRAVENOUS | Status: DC | PRN

## 2019-05-16 MED ORDER — MIDAZOLAM HCL 2 MG/2ML IJ SOLN
INTRAMUSCULAR | Status: AC
  Filled 2019-05-16: qty 2

## 2019-05-16 MED ORDER — HYDROMORPHONE HCL 1 MG/ML IJ SOLN: 1.0000 mg | Freq: Once | INTRAMUSCULAR | Status: DC | PRN

## 2019-05-16 MED ORDER — CHLORHEXIDINE GLUCONATE CLOTH 2 % EX PADS: 6.0000 | MEDICATED_PAD | Freq: Every day | CUTANEOUS | Status: DC

## 2019-05-16 MED ORDER — LIDOCAINE-PRILOCAINE 2.5-2.5 % EX CREA
1.0000 "application " | TOPICAL_CREAM | CUTANEOUS | Status: DC | PRN
  Filled 2019-05-16: qty 5

## 2019-05-16 MED ORDER — METHYLPREDNISOLONE SODIUM SUCC 125 MG IJ SOLR: 125.0000 mg | Freq: Once | INTRAMUSCULAR | Status: DC | PRN

## 2019-05-16 MED ORDER — MIDAZOLAM HCL 2 MG/2ML IJ SOLN
INTRAMUSCULAR | Status: DC | PRN
  Administered 2019-05-16: 2 mg via INTRAVENOUS

## 2019-05-16 MED ORDER — CEFAZOLIN SODIUM-DEXTROSE 1-4 GM/50ML-% IV SOLN
1.0000 g | Freq: Once | INTRAVENOUS | Status: AC
  Administered 2019-05-16: 1 g via INTRAVENOUS

## 2019-05-16 MED ORDER — LIDOCAINE HCL (PF) 1 % IJ SOLN: 5.0000 mL | INTRAMUSCULAR | Status: DC | PRN

## 2019-05-16 MED ORDER — ONDANSETRON HCL 4 MG/2ML IJ SOLN: 4.0000 mg | Freq: Four times a day (QID) | INTRAMUSCULAR | Status: DC | PRN

## 2019-05-16 MED ORDER — SODIUM CHLORIDE 0.9 % IV SOLN: INTRAVENOUS | Status: DC

## 2019-05-16 MED ORDER — MIDAZOLAM HCL 2 MG/ML PO SYRP: 8.0000 mg | ORAL_SOLUTION | Freq: Once | ORAL | Status: DC | PRN

## 2019-05-16 MED ORDER — DIPHENHYDRAMINE HCL 50 MG/ML IJ SOLN: 50.0000 mg | Freq: Once | INTRAMUSCULAR | Status: DC | PRN

## 2019-05-16 MED ORDER — FAMOTIDINE 20 MG PO TABS: 40.0000 mg | ORAL_TABLET | Freq: Once | ORAL | Status: DC | PRN

## 2019-05-16 MED ORDER — HEPARIN SODIUM (PORCINE) 1000 UNIT/ML DIALYSIS: 1000.0000 [IU] | INTRAMUSCULAR | Status: DC | PRN

## 2019-05-16 MED ORDER — PENTAFLUOROPROP-TETRAFLUOROETH EX AERO
1.0000 "application " | INHALATION_SPRAY | CUTANEOUS | Status: DC | PRN
  Filled 2019-05-16: qty 30

## 2019-05-16 MED ORDER — ALTEPLASE 2 MG IJ SOLR: 2.0000 mg | Freq: Once | INTRAMUSCULAR | Status: DC | PRN

## 2019-05-16 SURGICAL SUPPLY — 9 items
BIOPATCH RED 1 DISK 7.0 (GAUZE/BANDAGES/DRESSINGS) ×1 IMPLANT
BIOPATCH RED 1IN DISK 7.0MM (GAUZE/BANDAGES/DRESSINGS) ×1
CATH PALINDROME RT-P 15FX23CM (CATHETERS) ×2 IMPLANT
DERMABOND ADVANCED (GAUZE/BANDAGES/DRESSINGS) ×2
DERMABOND ADVANCED .7 DNX12 (GAUZE/BANDAGES/DRESSINGS) IMPLANT
PACK ANGIOGRAPHY (CUSTOM PROCEDURE TRAY) ×2 IMPLANT
SUT MNCRL AB 4-0 PS2 18 (SUTURE) ×2 IMPLANT
SUT PROLENE 0 CT 1 30 (SUTURE) ×2 IMPLANT
WIRE J 3MM .035X145CM (WIRE) ×2 IMPLANT

## 2019-05-16 NOTE — Progress Notes (Signed)
Daniel Thomas  MRN: [id number]  DOB/AGE: 1955/01/24 64 y.o.  Primary Care Physician:Miles, Connye Burkitt, MD  Admit date: 05/12/2019  Chief Complaint:  Chief Complaint  Patient presents with  . Neck Pain  . Shortness of Breath  . Fever    S-Pt presented on  05/12/2019 with  Chief Complaint  Patient presents with  . Neck Pain  . Shortness of Breath  . Fever  .    Pt today feels better.  Patient offers no complaint of shortness of breath, chest pain.   Medications . amLODipine  10 mg Oral Daily  . carvedilol  12.5 mg Oral BID WC  . citalopram  20 mg Oral Daily  . ferric citrate  210 mg Oral TID WC  . heparin  5,000 Units Subcutaneous Q8H  . hydrALAZINE  25 mg Oral Q8H  . linagliptin  5 mg Oral Daily  . magic mouthwash  10 mL Oral QID  . pravastatin  40 mg Oral q1800  . senna-docusate  2 tablet Oral BID         GYI:RSWNI from the symptoms mentioned above,there are no other symptoms referable to all systems reviewed.  Physical Exam: Vital signs in last 24 hours: Temp:  [97.4 F (36.3 C)-98 F (36.7 C)] 97.4 F (36.3 C) (05/13 1208) Pulse Rate:  [69-76] 74 (05/13 1208) Resp:  [16-20] 16 (05/13 1208) BP: (137-196)/(50-86) 137/73 (05/13 1208) SpO2:  [98 %-100 %] 100 % (05/13 1208) Weight:  [75.1 kg] 75.1 kg (05/13 0500) Weight change: 0.8 kg Last BM Date: 05/16/19  Intake/Output from previous day: 05/12 0701 - 05/13 0700 In: 469.2 [P.O.:240; I.V.:129.2; IV Piggyback:100] Out: 750 [Urine:750] Total I/O In: -  Out: 200 [Urine:200]   Physical Exam: General- pt is awake,alert, oriented to time place and person Resp- No acute REsp distress, CTA B/L NO Rhonchi CVS- S1S2 regular in rate and rhythm GIT- BS+, soft, NT, ND EXT- NO LE Edema, Cyanosis   Lab Results: CBC Recent Labs    05/14/19 0519 05/15/19 0552  WBC 15.1* 10.9*  HGB 10.7* 10.4*  HCT 31.5* 29.9*  PLT 263 210    BMET Recent Labs    05/14/19 0519 05/15/19 0552  NA 132* 129*  K 3.6 3.7   CL 93* 90*  CO2 25 24  GLUCOSE 109* 104*  BUN 40* 57*  CREATININE 6.32* 7.81*  CALCIUM 9.0 8.9    MICRO Recent Results (from the past 240 hour(s))  Culture, blood (Routine x 2)     Status: Abnormal   Collection Time: 05/12/19  6:09 PM   Specimen: BLOOD  Result Value Ref Range Status   Specimen Description   Final    BLOOD LEFT FA Performed at [facility], [address], [place], [place] [address]    Special Requests   Final    BOTTLES DRAWN AEROBIC AND ANAEROBIC Blood Culture adequate volume Performed at [facility], [address], [place], [place] [address]    Culture  Setup Time   Final    GRAM POSITIVE COCCI IN BOTH AEROBIC AND ANAEROBIC BOTTLES CRITICAL VALUE NOTED.  VALUE IS CONSISTENT WITH PREVIOUSLY REPORTED AND CALLED VALUE. Performed at [facility], [address] [place], [place] [address]    Culture (A)  Final    STAPHYLOCOCCUS LUGDUNENSIS SUSCEPTIBILITIES PERFORMED ON PREVIOUS CULTURE WITHIN THE LAST 5 DAYS. Performed at [facility], [place] [address] [place], [place] [address]    Report Status 05/15/2019 FINAL  Final  Culture, blood (Routine x 2)     Status: Abnormal   Collection Time: 05/12/19  6:09 PM   Specimen: BLOOD  Result Value Ref Range Status   Specimen Description   Final    BLOOD L HAND Performed at [facility], [address] [place], [place] [address]    Special Requests   Final    BOTTLES DRAWN AEROBIC AND ANAEROBIC Blood Culture adequate volume Performed at [facility], [address] [place], [place] [address]    Culture  Setup Time   Final    GRAM POSITIVE COCCI IN BOTH AEROBIC AND ANAEROBIC BOTTLES CRITICAL RESULT CALLED TO, READ BACK BY AND VERIFIED WITH: CHRISTINE KATSOUDAS AT 8295 ON 05/13/19 SNG Performed at [facility], [place] [address], [place], [place] [address]    Culture STAPHYLOCOCCUS LUGDUNENSIS (A)  Final   Report Status 05/15/2019 FINAL  Final    Organism ID, Bacteria STAPHYLOCOCCUS LUGDUNENSIS  Final      Susceptibility   Staphylococcus lugdunensis - MIC*    CIPROFLOXACIN <=0.5 SENSITIVE Sensitive     ERYTHROMYCIN <=0.25 SENSITIVE Sensitive     GENTAMICIN <=0.5 SENSITIVE Sensitive     OXACILLIN 2 SENSITIVE Sensitive     TETRACYCLINE <=1 SENSITIVE Sensitive     VANCOMYCIN <=0.5 SENSITIVE Sensitive     TRIMETH/SULFA <=10 SENSITIVE Sensitive     CLINDAMYCIN <=0.25 SENSITIVE Sensitive     RIFAMPIN <=0.5 SENSITIVE Sensitive     Inducible Clindamycin NEGATIVE Sensitive     * STAPHYLOCOCCUS LUGDUNENSIS  Blood Culture ID Panel (Reflexed)     Status: Abnormal   Collection Time: 05/12/19  6:09 PM  Result Value Ref Range Status   Enterococcus species NOT DETECTED NOT DETECTED Final   Listeria monocytogenes NOT DETECTED NOT DETECTED Final   Staphylococcus species DETECTED (A) NOT DETECTED Final    Comment: Methicillin (oxacillin) susceptible coagulase negative staphylococcus. Possible blood culture contaminant (unless isolated from more than one blood culture draw or clinical case suggests pathogenicity). No antibiotic treatment is indicated for blood  culture contaminants. CRITICAL RESULT CALLED TO, READ BACK BY AND VERIFIED WITH: CHRISTINE KATSOUDAS AT 8657 ON 05/13/19 SNG    Staphylococcus aureus (BCID) NOT DETECTED NOT DETECTED Final   Methicillin resistance NOT DETECTED NOT DETECTED Final   Streptococcus species NOT DETECTED NOT DETECTED Final   Streptococcus agalactiae NOT DETECTED NOT DETECTED Final   Streptococcus pneumoniae NOT DETECTED NOT DETECTED Final   Streptococcus pyogenes NOT DETECTED NOT DETECTED Final   Acinetobacter baumannii NOT DETECTED NOT DETECTED Final   Enterobacteriaceae species NOT DETECTED NOT DETECTED Final   Enterobacter cloacae complex NOT DETECTED NOT DETECTED Final   Escherichia coli NOT DETECTED NOT DETECTED Final   Klebsiella oxytoca NOT DETECTED NOT DETECTED Final   Klebsiella pneumoniae NOT  DETECTED NOT DETECTED Final   Proteus species NOT DETECTED NOT DETECTED Final   Serratia marcescens NOT DETECTED NOT DETECTED Final   Haemophilus influenzae NOT DETECTED NOT DETECTED Final   Neisseria meningitidis NOT DETECTED NOT DETECTED Final   Pseudomonas aeruginosa NOT DETECTED NOT DETECTED Final   Candida albicans NOT DETECTED NOT DETECTED Final   Candida glabrata NOT DETECTED NOT DETECTED Final   Candida krusei NOT DETECTED NOT DETECTED Final   Candida parapsilosis NOT DETECTED NOT DETECTED Final   Candida tropicalis NOT DETECTED NOT DETECTED Final    Comment: Performed at [facility], [address] [place], [place] [address]  Respiratory Panel by RT PCR (Flu A&B, Covid) - Nasopharyngeal  Swab     Status: None   Collection Time: 05/12/19  6:10 PM   Specimen: Nasopharyngeal Swab  Result Value Ref Range Status   SARS Coronavirus 2 by RT PCR NEGATIVE NEGATIVE Final    Comment: (NOTE) SARS-CoV-2 target nucleic acids are NOT DETECTED. The SARS-CoV-2 RNA is generally detectable in upper respiratoy specimens during the acute phase of infection. The lowest concentration of SARS-CoV-2 viral copies this assay can detect is 131 copies/mL. A negative result does not preclude SARS-Cov-2 infection and should not be used as the sole basis for treatment or other patient management decisions. A negative result may occur with  improper specimen collection/handling, submission of specimen other than nasopharyngeal swab, presence of viral mutation(s) within the areas targeted by this assay, and inadequate number of viral copies (<131 copies/mL). A negative result must be combined with clinical observations, patient history, and epidemiological information. The expected result is Negative. Fact Sheet for Patients:  PinkCheek.be Fact Sheet for Healthcare Providers:  GravelBags.it This test is not yet ap proved or cleared by the  [place] FDA and  has been authorized for detection and/or diagnosis of SARS-CoV-2 by FDA under an Emergency Use Authorization (EUA). This EUA will remain  in effect (meaning this test can be used) for the duration of the COVID-19 declaration under Section 564(b)(1) of the Act, 21 U.S.C. section 360bbb-3(b)(1), unless the authorization is terminated or revoked sooner.    Influenza A by PCR NEGATIVE NEGATIVE Final   Influenza B by PCR NEGATIVE NEGATIVE Final    Comment: (NOTE) The Xpert Xpress SARS-CoV-2/FLU/RSV assay is intended as an aid in  the diagnosis of influenza from Nasopharyngeal swab specimens and  should not be used as a sole basis for treatment. Nasal washings and  aspirates are unacceptable for Xpert Xpress SARS-CoV-2/FLU/RSV  testing. Fact Sheet for Patients: PinkCheek.be Fact Sheet for Healthcare Providers: GravelBags.it This test is not yet approved or cleared by the [place] FDA and  has been authorized for detection and/or diagnosis of SARS-CoV-2 by  FDA under an Emergency Use Authorization (EUA). This EUA will remain  in effect (meaning this test can be used) for the duration of the  Covid-19 declaration under Section 564(b)(1) of the Act, 21  U.S.C. section 360bbb-3(b)(1), unless the authorization is  terminated or revoked. Performed at [facility], [address] [place], [place] [address]   MRSA PCR Screening     Status: None   Collection Time: 05/13/19 12:04 AM   Specimen: Nasopharyngeal  Result Value Ref Range Status   MRSA by PCR NEGATIVE NEGATIVE Final    Comment:        The GeneXpert MRSA Assay (FDA approved for NASAL specimens only), is one component of a comprehensive MRSA colonization surveillance program. It is not intended to diagnose MRSA infection nor to guide or monitor treatment for MRSA infections. Performed at [facility], [address]  [place], [place] [address]   CULTURE, BLOOD (ROUTINE X 2) w Reflex to ID Panel     Status: None (Preliminary result)   Collection Time: 05/14/19 10:36 AM   Specimen: BLOOD RIGHT ARM  Result Value Ref Range Status   Specimen Description BLOOD RIGHT ARM  Final   Special Requests   Final    BOTTLES DRAWN AEROBIC AND ANAEROBIC Blood Culture adequate volume   Culture   Final    NO GROWTH 2 DAYS Performed at [facility], [address] [place], [place] [address]  Report Status PENDING  Incomplete  CULTURE, BLOOD (ROUTINE X 2) w Reflex to ID Panel     Status: None (Preliminary result)   Collection Time: 05/14/19 10:43 AM   Specimen: BLOOD LEFT ARM  Result Value Ref Range Status   Specimen Description BLOOD LEFT ARM  Final   Special Requests   Final    BOTTLES DRAWN AEROBIC AND ANAEROBIC Blood Culture adequate volume   Culture   Final    NO GROWTH 2 DAYS Performed at [facility], [place], [place] [address]    Report Status PENDING  Incomplete      Lab Results  Component Value Date   CALCIUM 8.9 05/15/2019   PHOS 3.6 01/16/2018               Impression:  Patient is a 64 year old male with a past medical history of COPD, diabetes mellitus type 2, hypertension, hyperlipidemia, BPH, ESRD patient is a hemodialysis on Tuesday Thursday Saturday schedule, anemia of chronic disease, second hyperparathyroidism who was admitted with a chief complaint of fever and suspected sepsis.   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 Patient is on Tuesday Thursday Saturday schedule as an outpatient Patient is a [facility] nephrology patient who goes to dialysis at [facility] [employer] location. No acute need for renal replacement therapy today  2)HTN Blood pressure is at goal  3)Anemia of chronic disease  HGb at goal (9--11)   4) secondary hyperparathyroidism -CKD Mineral-Bone Disorder   Secondary Hyperparathyroidism  present  Phosphorus at  goal.   5) fever with suspected sepsis Possible source was thought to be tunnel catheter Patient tunnel catheter was removed Patient is currently catheter free. Patient current cultures are negative from 05/14/2019 Patient WBC count have come down from 27.4 to now 10.9 Plan is to place permacath tomorrow   6) electrolytes   sodium Hyponatremia secondary to ESRD-inability to get rid of free water Will discuss with the patient to decrease his p.o. water intake   potassium Normokalemic    7)Acid base Co2 at goal     Plan:   No acute need for renal placement therapy today No acute need for placing catheter today Patient will most likely need/get catheter in the morning and will dialyze patient after that    [facility] 05/16/2019, 1:38 PM

## 2019-05-16 NOTE — Progress Notes (Signed)
PROGRESS NOTE    Daniel Thomas  LGX:[id number] DOB: 07-05-1955 DOA: 05/12/2019 PCP: Daniel Thomas, Daniel Thomas    Brief Narrative:  Daniel Kutzer Tottenis a 64 y.o.malewith medical history significant ofend-stage renal disease on hemodialysis on Tuesdays Thursdays and Saturdays, hypertension, diabetes currently not on medications, hypertension, hyperlipidemia and BPH who came to the ER complaining of neck pain shortness of breath and fever.  Upon arrival in the emergency room, he had a temperature of 103, leukocytosis with a white cell count of 21.4. Covid was negative. Chest x-ray had no acute changes. He has not made any urine. Patient was diagnosed with sepsis, with potential line infection. He is placed on vancomycin, cefepime and Flagyl.    Consultants:   Cardiology, nephrology, ID  Procedures: TEE   Antimicrobials:   Cefazolin   Subjective: No complaints.    Objective: Vitals:   05/15/19 1204 05/15/19 2107 05/16/19 0500 05/16/19 0503  BP: (!) 152/76 138/67  (!) 196/86  Pulse: 63 69  76  Resp: 14 20  20   Temp: (!) 97.4 F (36.3 C) 98 F (36.7 C)  97.8 F (36.6 C)  TempSrc: Oral Oral  Oral  SpO2: 97% 99%  98%  Weight:   75.1 kg   Height:        Intake/Output Summary (Last 24 hours) at 05/16/2019 0804 Last data filed at 05/16/2019 8144 Gross per 24 hour  Intake 469.17 ml  Output 750 ml  Net -280.83 ml   Filed Weights   05/13/19 0500 05/15/19 0459 05/16/19 0500  Weight: 73.1 kg 74.3 kg 75.1 kg    Examination:  General exam: Appears calm and comfortable, NAD, lying in bed Respiratory system: Clear to auscultation. Respiratory effort normal.  No wheezing Cardiovascular system: S1 & S2 heard, RRR. No JVD, murmurs, rubs, gallops or clicks.  Gastrointestinal system: Abdomen is nondistended, soft and nontender.  Normal bowel sounds heard. Central nervous system: Alert and oriented.  Grossly intact Extremities: No edema Skin: Warm dry Psychiatry:Mood & affect  appropriate current setting.     Data Reviewed: I have personally reviewed following labs and imaging studies  CBC: Recent Labs  Lab 05/12/19 1809 05/13/19 0547 05/14/19 0519 05/15/19 0552  WBC 21.4* 27.4* 15.1* 10.9*  NEUTROABS 19.3*  --  11.6* 7.3  HGB 11.7* 10.7* 10.7* 10.4*  HCT 35.1* 30.6* 31.5* 29.9*  MCV 89.1 86.9 88.5 86.2  PLT 316 273 263 818   Basic Metabolic Panel: Recent Labs  Lab 05/12/19 1809 05/13/19 0547 05/14/19 0519 05/15/19 0552  NA 133* 133* 132* 129*  K 3.6 3.5 3.6 3.7  CL 94* 93* 93* 90*  CO2 21* 25 25 24   GLUCOSE 120* 121* 109* 104*  BUN 52* 57* 40* 57*  CREATININE 7.23* 8.12* 6.32* 7.81*  CALCIUM 9.4 9.2 9.0 8.9   GFR: Estimated Creatinine Clearance: 8 mL/min (A) (by C-G formula based on SCr of 7.81 mg/dL (H)). Liver Function Tests: Recent Labs  Lab 05/12/19 1809 05/13/19 0547  AST 23 20  ALT 23 20  ALKPHOS 117 99  BILITOT 1.0 0.7  PROT 7.5 7.0  ALBUMIN 4.0 3.5   No results for input(s): LIPASE, AMYLASE in the last 168 hours. No results for input(s): AMMONIA in the last 168 hours. Coagulation Profile: Recent Labs  Lab 05/12/19 1809  INR 1.1   Cardiac Enzymes: No results for input(s): CKTOTAL, CKMB, CKMBINDEX, TROPONINI in the last 168 hours. BNP (last 3 results) No results for input(s): PROBNP in the last 8760 hours.  HbA1C: No results for input(s): HGBA1C in the last 72 hours. CBG: No results for input(s): GLUCAP in the last 168 hours. Lipid Profile: No results for input(s): CHOL, HDL, LDLCALC, TRIG, CHOLHDL, LDLDIRECT in the last 72 hours. Thyroid Function Tests: No results for input(s): TSH, T4TOTAL, FREET4, T3FREE, THYROIDAB in the last 72 hours. Anemia Panel: No results for input(s): VITAMINB12, FOLATE, FERRITIN, TIBC, IRON, RETICCTPCT in the last 72 hours. Sepsis Labs: Recent Labs  Lab 05/12/19 1809  LATICACIDVEN 1.0    Recent Results (from the past 240 hour(s))  Culture, blood (Routine x 2)     Status:  Abnormal   Collection Time: 05/12/19  6:09 PM   Specimen: BLOOD  Result Value Ref Range Status   Specimen Description   Final    BLOOD LEFT FA Performed at [facility], [address], [place], [place] [address]    Special Requests   Final    BOTTLES DRAWN AEROBIC AND ANAEROBIC Blood Culture adequate volume Performed at [facility], [address] [place], [place] [address]    Culture  Setup Time   Final    GRAM POSITIVE COCCI IN BOTH AEROBIC AND ANAEROBIC BOTTLES CRITICAL VALUE NOTED.  VALUE IS CONSISTENT WITH PREVIOUSLY REPORTED AND CALLED VALUE. Performed at [facility], [address] [place], [place] [address]    Culture (A)  Final    STAPHYLOCOCCUS LUGDUNENSIS SUSCEPTIBILITIES PERFORMED ON PREVIOUS CULTURE WITHIN THE LAST 5 DAYS. Performed at [facility], [place] [address] [place], [place] [address]    Report Status 05/15/2019 FINAL  Final  Culture, blood (Routine x 2)     Status: Abnormal   Collection Time: 05/12/19  6:09 PM   Specimen: BLOOD  Result Value Ref Range Status   Specimen Description   Final    BLOOD L HAND Performed at [facility], [address] [place], [place] [address]    Special Requests   Final    BOTTLES DRAWN AEROBIC AND ANAEROBIC Blood Culture adequate volume Performed at [facility], [address] [place], [place] [address]    Culture  Setup Time   Final    GRAM POSITIVE COCCI IN BOTH AEROBIC AND ANAEROBIC BOTTLES CRITICAL RESULT CALLED TO, READ BACK BY AND VERIFIED WITH: CHRISTINE KATSOUDAS AT 1497 ON 05/13/19 SNG Performed at [facility], [place] [address], [place],  [address]    Culture STAPHYLOCOCCUS LUGDUNENSIS (A)  Final   Report Status 05/15/2019 FINAL  Final   Organism ID, Bacteria STAPHYLOCOCCUS LUGDUNENSIS  Final      Susceptibility   Staphylococcus lugdunensis - MIC*    CIPROFLOXACIN <=0.5 SENSITIVE Sensitive     ERYTHROMYCIN <=0.25 SENSITIVE Sensitive       GENTAMICIN <=0.5 SENSITIVE Sensitive     OXACILLIN 2 SENSITIVE Sensitive     TETRACYCLINE <=1 SENSITIVE Sensitive     VANCOMYCIN <=0.5 SENSITIVE Sensitive     TRIMETH/SULFA <=10 SENSITIVE Sensitive     CLINDAMYCIN <=0.25 SENSITIVE Sensitive     RIFAMPIN <=0.5 SENSITIVE Sensitive     Inducible Clindamycin NEGATIVE Sensitive     * STAPHYLOCOCCUS LUGDUNENSIS  Blood Culture ID Panel (Reflexed)     Status: Abnormal   Collection Time: 05/12/19  6:09 PM  Result Value Ref Range Status   Enterococcus species NOT DETECTED NOT DETECTED Final   Listeria monocytogenes NOT DETECTED NOT DETECTED Final   Staphylococcus species DETECTED (A) NOT DETECTED Final    Comment: Methicillin (oxacillin) susceptible coagulase negative  staphylococcus. Possible blood culture contaminant (unless isolated from more than one blood culture draw or clinical case suggests pathogenicity). No antibiotic treatment is indicated for blood  culture contaminants. CRITICAL RESULT CALLED TO, READ BACK BY AND VERIFIED WITH: CHRISTINE KATSOUDAS AT 0347 ON 05/13/19 SNG    Staphylococcus aureus (BCID) NOT DETECTED NOT DETECTED Final   Methicillin resistance NOT DETECTED NOT DETECTED Final   Streptococcus species NOT DETECTED NOT DETECTED Final   Streptococcus agalactiae NOT DETECTED NOT DETECTED Final   Streptococcus pneumoniae NOT DETECTED NOT DETECTED Final   Streptococcus pyogenes NOT DETECTED NOT DETECTED Final   Acinetobacter baumannii NOT DETECTED NOT DETECTED Final   Enterobacteriaceae species NOT DETECTED NOT DETECTED Final   Enterobacter cloacae complex NOT DETECTED NOT DETECTED Final   Escherichia coli NOT DETECTED NOT DETECTED Final   Klebsiella oxytoca NOT DETECTED NOT DETECTED Final   Klebsiella pneumoniae NOT DETECTED NOT DETECTED Final   Proteus species NOT DETECTED NOT DETECTED Final   Serratia marcescens NOT DETECTED NOT DETECTED Final   Haemophilus influenzae NOT DETECTED NOT DETECTED Final   Neisseria  meningitidis NOT DETECTED NOT DETECTED Final   Pseudomonas aeruginosa NOT DETECTED NOT DETECTED Final   Candida albicans NOT DETECTED NOT DETECTED Final   Candida glabrata NOT DETECTED NOT DETECTED Final   Candida krusei NOT DETECTED NOT DETECTED Final   Candida parapsilosis NOT DETECTED NOT DETECTED Final   Candida tropicalis NOT DETECTED NOT DETECTED Final    Comment: Performed at [facility], [address] [place], [place] [address]  Respiratory Panel by RT PCR (Flu A&B, Covid) - Nasopharyngeal Swab     Status: None   Collection Time: 05/12/19  6:10 PM   Specimen: Nasopharyngeal Swab  Result Value Ref Range Status   SARS Coronavirus 2 by RT PCR NEGATIVE NEGATIVE Final    Comment: (NOTE) SARS-CoV-2 target nucleic acids are NOT DETECTED. The SARS-CoV-2 RNA is generally detectable in upper respiratoy specimens during the acute phase of infection. The lowest concentration of SARS-CoV-2 viral copies this assay can detect is 131 copies/mL. A negative result does not preclude SARS-Cov-2 infection and should not be used as the sole basis for treatment or other patient management decisions. A negative result may occur with  improper specimen collection/handling, submission of specimen other than nasopharyngeal swab, presence of viral mutation(s) within the areas targeted by this assay, and inadequate number of viral copies (<131 copies/mL). A negative result must be combined with clinical observations, patient history, and epidemiological information. The expected result is Negative. Fact Sheet for Patients:  PinkCheek.be Fact Sheet for Healthcare Providers:  GravelBags.it This test is not yet ap proved or cleared by the [place] FDA and  has been authorized for detection and/or diagnosis of SARS-CoV-2 by FDA under an Emergency Use Authorization (EUA). This EUA will remain  in effect (meaning this test can be used)  for the duration of the COVID-19 declaration under Section 564(b)(1) of the Act, 21 U.S.C. section 360bbb-3(b)(1), unless the authorization is terminated or revoked sooner.    Influenza A by PCR NEGATIVE NEGATIVE Final   Influenza B by PCR NEGATIVE NEGATIVE Final    Comment: (NOTE) The Xpert Xpress SARS-CoV-2/FLU/RSV assay is intended as an aid in  the diagnosis of influenza from Nasopharyngeal swab specimens and  should not be used as a sole basis for treatment. Nasal washings and  aspirates are unacceptable for Xpert Xpress SARS-CoV-2/FLU/RSV  testing. Fact Sheet for Patients: PinkCheek.be Fact Sheet for Healthcare Providers: GravelBags.it This test  is not yet approved or cleared by the [place] and  has been authorized for detection and/or diagnosis of SARS-CoV-2 by  FDA under an Emergency Use Authorization (EUA). This EUA will remain  in effect (meaning this test can be used) for the duration of the  Covid-19 declaration under Section 564(b)(1) of the Act, 21  U.S.C. section 360bbb-3(b)(1), unless the authorization is  terminated or revoked. Performed at [facility], [address] [place], [place] [address]   MRSA PCR Screening     Status: None   Collection Time: 05/13/19 12:04 AM   Specimen: Nasopharyngeal  Result Value Ref Range Status   MRSA by PCR NEGATIVE NEGATIVE Final    Comment:        The GeneXpert MRSA Assay (FDA approved for NASAL specimens only), is one component of a comprehensive MRSA colonization surveillance program. It is not intended to diagnose MRSA infection nor to guide or monitor treatment for MRSA infections. Performed at [facility], [address] [place], [place] [address]   CULTURE, BLOOD (ROUTINE X 2) w Reflex to ID Panel     Status: None (Preliminary result)   Collection Time: 05/14/19 10:36 AM   Specimen: BLOOD RIGHT ARM  Result Value Ref Range  Status   Specimen Description BLOOD RIGHT ARM  Final   Special Requests   Final    BOTTLES DRAWN AEROBIC AND ANAEROBIC Blood Culture adequate volume   Culture   Final    NO GROWTH 2 DAYS Performed at [facility], [address], [place], [place] [address]    Report Status PENDING  Incomplete  CULTURE, BLOOD (ROUTINE X 2) w Reflex to ID Panel     Status: None (Preliminary result)   Collection Time: 05/14/19 10:43 AM   Specimen: BLOOD LEFT ARM  Result Value Ref Range Status   Specimen Description BLOOD LEFT ARM  Final   Special Requests   Final    BOTTLES DRAWN AEROBIC AND ANAEROBIC Blood Culture adequate volume   Culture   Final    NO GROWTH 2 DAYS Performed at [facility], [address] [place], [place] [address]    Report Status PENDING  Incomplete         Radiology Studies: ECHO TEE  Result Date: 05/15/2019    TRANSESOPHOGEAL ECHO REPORT   Patient Name:   Daniel Thomas Date of Exam: 05/15/2019 Medical Rec #:  [id number]      Height:       59.0 in Accession #:    [id number]     Weight:       163.8 lb Date of Birth:  [birth year]      BSA:          1.694 m Patient Age:    16 years       BP:           204/87 mmHg Patient Gender: M              HR:           85 bpm. Exam Location:  [facility] Procedure: Transesophageal Echo, Color Doppler and [place] Contrast Bubble Study Indications:     R78.81 Bacteremia  History:         Patient has prior history of Echocardiogram examinations. COPD                  and ESRD; Risk Factors:Hypertension, HCL and Diabetes.  Sonographer:     Charmayne Sheer  RDCS (AE) Referring Phys:  [id number] Daniel Thomas Diagnosing Phys: Daniel Bill Daniel Thomas PROCEDURE: TEE procedure time was 18 minutes. The transesophogeal probe was passed without difficulty through the esophogus of the patient. Imaged were obtained with the patient in a left lateral decubitus position. Local oropharyngeal anesthetic was provided with Benzocaine spray and viscous lidocaine. Sedation  performed by performing physician. Image quality was excellent. The patient's vital signs; including heart rate, blood pressure, and oxygen saturation; remained stable throughout the procedure. The patient developed no complications during the procedure. Chordae thickening. No obvious vegetations. IMPRESSIONS  1. Left ventricular ejection fraction, by estimation, is 55 to 60%. The left ventricle has normal function. The left ventricle has no regional wall motion abnormalities.  2. Right ventricular systolic function is normal. The right ventricular size is normal.  3. No left atrial/left atrial appendage thrombus was detected.  4. The mitral valve is grossly normal. Trivial mitral valve regurgitation.  5. The aortic valve is tricuspid. Aortic valve regurgitation is trivial. No aortic stenosis is present. FINDINGS  Left Ventricle: Left ventricular ejection fraction, by estimation, is 55 to 60%. The left ventricle has normal function. The left ventricle has no regional wall motion abnormalities. The left ventricular internal cavity size was normal in size. There is  no left ventricular hypertrophy. Right Ventricle: The right ventricular size is normal. No increase in right ventricular wall thickness. Right ventricular systolic function is normal. Left Atrium: Left atrial size was normal in size. No left atrial/left atrial appendage thrombus was detected. Right Atrium: Right atrial size was normal in size. Pericardium: There is no evidence of pericardial effusion. Mitral Valve: The mitral valve is grossly normal. Normal mobility of the mitral valve leaflets. Trivial mitral valve regurgitation. Tricuspid Valve: The tricuspid valve is grossly normal. Tricuspid valve regurgitation is trivial. Aortic Valve: The aortic valve is tricuspid. Aortic valve regurgitation is trivial. No aortic stenosis is present. Pulmonic Valve: The pulmonic valve was not well visualized. Pulmonic valve regurgitation is trivial. Aorta: The aortic  root is normal in size and structure. IAS/Shunts: No atrial level shunt detected by color flow Doppler. Agitated saline contrast was given intravenously to evaluate for intracardiac shunting. Daniel Bill Daniel Thomas Electronically signed by Daniel Bill Daniel Thomas Signature Date/Time: 05/15/2019/9:51:13 AM    Final         Scheduled Meds: . amLODipine  10 mg Oral Daily  . carvedilol  12.5 mg Oral BID WC  . citalopram  20 mg Oral Daily  . ferric citrate  210 mg Oral TID WC  . heparin  5,000 Units Subcutaneous Q8H  . hydrALAZINE  25 mg Oral Q8H  . linagliptin  5 mg Oral Daily  . magic mouthwash  10 mL Oral QID  . pravastatin  40 mg Oral q1800  . senna-docusate  2 tablet Oral BID   Continuous Infusions: . sodium chloride 10 mL/hr at 05/15/19 0833  .  ceFAZolin (ANCEF) IV Stopped (05/15/19 1826)    Assessment & Plan:   Principal Problem:   Sepsis ([place]) Active Problems:   Malignant hypertension   ESRD (end stage renal disease) ([place])   Leucocytosis   Septicemia due to coagulase-negative staphylococcal infection ([place])   #1.  Staph lugdunensis bacteremia/septicemia. Secondary to line infection.  Permacath removed.  Repeat blood cultures pending so far negative.    ID following.recommend Cefazolin 2 grams IV on Tuesday, 2 grams IV on Thursday and 3 grams IV on Saturday during dialysis Until 06/08/19 weekly cbc, bmp TEE  neg. For  endocarditis  #2.  Neck pain. CT scan of the neck showed degenerative disc disease, no evidence of osteomyelitis.  Most likely due to osteoarthritis.  Symptomatic treatment.  3.  End-stage renal disease. Followed by nephrology, may be able to place another permacath today since repeat blood cultures so far negative, n.p.o. at this time Vascular following  4.  Benign prostate hypertrophy. Continue home medicines.  5.  Essential hypertension.   Continue home medicine.  6.  Constipation.   Was given bowel regimen  7. Anemia of chronic kidney disease.  Consider  restarting Epogen once back on dialysis  8.Hyponatremia.- likely vol. Overload. Asx. Will monitor level after HD.  DVT prophylaxis: heparin Code Status: full  Family Communication: brother at bedside updated Disposition Plan: back to home life Barrier: Permacath needs to be placed today, start HD, possible dc in 1-2 days      LOS: 4 days   Time spent: 45 min with >50% on coc    Daniel Hanlon, Daniel Thomas [facility] Pager [phone]  If 7PM-7AM, please contact night-coverage [email or web address] Password [email or web address] 05/16/2019, 8:04 AM Patient ID: ZYAD BOOMER, male   DOB: 05-30-1955, 64 y.o.   MRN: [id number]

## 2019-05-16 NOTE — H&P (Signed)
[facility] History & Physical Update  The patient was interviewed and re-examined.  The patient's previous History and Physical has been reviewed and is unchanged.  There is no change in the plan of care. We plan to proceed with the scheduled procedure.  Leotis Pain, MD  05/16/2019, 5:29 PM

## 2019-05-16 NOTE — Progress Notes (Signed)
PHARMACY CONSULT NOTE FOR:  OUTPATIENT  PARENTERAL ANTIBIOTIC THERAPY (OPAT)  Indication: S. lugdunensis bacteremia   Regimen: cefazolin 2gm on Tue and Thur and 3gm on Sat at end of hemodialysis End date: 06/08/2019  IV antibiotic discharge orders are pended. To discharging provider:  please sign these orders via discharge navigator,  Select New Orders & click on the button choice - Manage This Unsigned Work.     Thank you for allowing pharmacy to be a part of this patient's care.  Doreene Eland, PharmD, BCPS.   Work Cell: [phone] 05/16/2019 11:42 AM

## 2019-05-16 NOTE — Treatment Plan (Signed)
Diagnosis: Staph lugdunensis bacteremia Baseline Creatinine ESRD on dialysis   No Known Allergies  OPAT Orders Cefazolin 2 grams IV on Tuesday, 2 grams IV on Thursday and 3 grams IV on Saturday during dialysis Until 06/08/19   Labs weekly while on IV antibiotics: _X_ CBC with differential  _X_ CMP   _ Fax weekly labs to Dr.Danyal Adorno [phone]  Clinic Follow Up Appt: 4 weeks   Call [phone] to make appt with Dr.Sadiyah Kangas

## 2019-05-16 NOTE — Op Note (Signed)
OPERATIVE NOTE    PRE-OPERATIVE DIAGNOSIS: 1. ESRD   POST-OPERATIVE DIAGNOSIS: same as above  PROCEDURE: 1. Ultrasound guidance for vascular access to the left internal jugular vein 2. Fluoroscopic guidance for placement of catheter 3. Placement of a 23 cm tip to cuff tunneled hemodialysis catheter via the left internal jugular vein  SURGEON: Leotis Pain, MD  ANESTHESIA:  Local with Moderate conscious sedation for approximately 20 minutes using 2 mg of Versed and 100 mcg of Fentanyl  ESTIMATED BLOOD LOSS: 3 cc  FLUORO TIME: less than one minute  CONTRAST: none  FINDING(S): 1.  Patent left internal jugular vein  SPECIMEN(S):  None  INDICATIONS:   Daniel Thomas is a 64 y.o. male who presents with renal failure and a recent infection requiring permcath removal.  The patient needs long term dialysis access for their ESRD, and a Permcath is necessary.  Risks and benefits are discussed and informed consent is obtained.    DESCRIPTION: After obtaining full informed written consent, the patient was brought back to the vascular suited. The patient's left neck and chest were sterilely prepped and draped in a sterile surgical field was created. Moderate conscious sedation was administered during a face to face encounter with the patient throughout the procedure with my supervision of the RN administering medicines and monitoring the patient's vital signs, pulse oximetry, telemetry and mental status throughout from the start of the procedure until the patient was taken to the recovery room.  The left internal jugular vein was visualized with ultrasound and found to be patent. It was then accessed under direct ultrasound guidance and a permanent image was recorded. A wire was placed. After skin nick and dilatation, the peel-away sheath was placed over the wire. I then turned my attention to an area under the clavicle. Approximately 1-2 fingerbreadths below the clavicle a small counterincision was  created and tunneled from the subclavicular incision to the access site. Using fluoroscopic guidance, a 23 centimeter tip to cuff tunneled hemodialysis catheter was selected, and tunneled from the subclavicular incision to the access site. It was then placed through the peel-away sheath and the peel-away sheath was removed. Using fluoroscopic guidance the catheter tips were parked in the right atrium. The appropriate distal connectors were placed. It withdrew blood well and flushed easily with heparinized saline and a concentrated heparin solution was then placed. It was secured to the chest wall with 2 Prolene sutures. The access incision was closed single 4-0 Monocryl. A 4-0 Monocryl pursestring suture was placed around the exit site. Sterile dressings were placed. The patient tolerated the procedure well and was taken to the recovery room in stable condition.  COMPLICATIONS: None  CONDITION: Stable  Leotis Pain  05/16/2019, 7:44 PM   This note was created with Dragon Medical transcription system. Any errors in dictation are purely unintentional.

## 2019-05-17 ENCOUNTER — Encounter: Payer: Self-pay | Admitting: Cardiology

## 2019-05-17 DIAGNOSIS — A049 Bacterial intestinal infection, unspecified: Secondary | ICD-10-CM | POA: Insufficient documentation

## 2019-05-17 DIAGNOSIS — A499 Bacterial infection, unspecified: Secondary | ICD-10-CM | POA: Insufficient documentation

## 2019-05-17 LAB — BASIC METABOLIC PANEL WITH GFR
Anion gap: 12 (ref 5–15)
BUN: 83 mg/dL — ABNORMAL HIGH (ref 8–23)
CO2: 26 mmol/L (ref 22–32)
Calcium: 9.1 mg/dL (ref 8.9–10.3)
Chloride: 96 mmol/L — ABNORMAL LOW (ref 98–111)
Creatinine, Ser: 9.72 mg/dL — ABNORMAL HIGH (ref 0.61–1.24)
GFR calc Af Amer: 6 mL/min — ABNORMAL LOW
GFR calc non Af Amer: 5 mL/min — ABNORMAL LOW
Glucose, Bld: 146 mg/dL — ABNORMAL HIGH (ref 70–99)
Potassium: 3.8 mmol/L (ref 3.5–5.1)
Sodium: 134 mmol/L — ABNORMAL LOW (ref 135–145)

## 2019-05-17 LAB — PHOSPHORUS
Phosphorus: 6.5 mg/dL — ABNORMAL HIGH (ref 2.5–4.6)
Phosphorus: 2.9 mg/dL (ref 2.5–4.6)

## 2019-05-17 LAB — HEPATITIS B CORE ANTIBODY, TOTAL: Hep B Core Total Ab: NONREACTIVE

## 2019-05-17 LAB — HEPATITIS B SURFACE ANTIGEN: Hepatitis B Surface Ag: NONREACTIVE

## 2019-05-17 MED ORDER — HYDRALAZINE HCL 25 MG PO TABS: 25.0000 mg | ORAL_TABLET | Freq: Three times a day (TID) | ORAL | 0 refills | Status: AC

## 2019-05-17 MED ORDER — CEFAZOLIN SODIUM-DEXTROSE 1-4 GM/50ML-% IV SOLN: 1.0000 g | INTRAVENOUS | Status: DC

## 2019-05-17 MED ORDER — CEFAZOLIN IV (FOR PTA / DISCHARGE USE ONLY)
2.0000 g | INTRAVENOUS | 0 refills | Status: AC
Start: 2019-05-17 — End: 2019-06-09

## 2019-05-17 MED ORDER — CALCIUM CARBONATE ANTACID 500 MG PO CHEW: 500.0000 mg | CHEWABLE_TABLET | Freq: Four times a day (QID) | ORAL | 0 refills | Status: DC | PRN

## 2019-05-17 MED ORDER — LOVASTATIN 40 MG PO TABS: 40.0000 mg | ORAL_TABLET | Freq: Every day | ORAL | Status: DC

## 2019-05-17 NOTE — Progress Notes (Signed)
Pre HD     05/17/19 1205  Vital Signs  Temp 97.8 F (36.6 C)  Temp Source Oral  Pulse Rate 70  Pulse Rate Source Dinamap  Resp 16  BP (!) 167/77  BP Location Left Arm  BP Method Automatic  Patient Position (if appropriate) Lying  Time-Out for Hemodialysis  What Procedure? HD   Pt Identifiers(min of two) First/Last Name;Pt's DOB(use if MRN/Acct# not available;MRN/Account#  Correct Site? Yes  Correct Side? Yes  Correct Procedure? Yes  Consents Verified? Yes  Rad Studies Available? N/A  Safety Precautions Reviewed? Yes  Machine [employer] Number 6  Station Number [id number]  UF/Alarm Test Passed  Conductivity: Meter 14.2  Conductivity: Machine  14  pH 7  Reverse Osmosis WRO3  Normal Saline Lot Number [id number]  Dialyzer Lot Number [id number]  Disposable Set Lot Number [id number]  Dialysate Acid Bath Lot Number [id number]  Air Detector Armed and Audible Yes  Blood Lines Intact and Secured Yes  Pre Treatment Patient Checks  Vascular access used during treatment Catheter  HD catheter dressing before treatment WDL  Patient is receiving dialysis in a chair  (no)  Hepatitis B Surface Antigen Results Pending  Date Hepatitis B Surface Antigen Drawn 05/17/19  Isolation Initiated  (no)  Hepatitis B Surface Antibody  (unk)  Date Hepatitis B Surface Antibody Drawn 05/17/19  Hemodialysis Consent Verified Yes  Hemodialysis Standing Orders Initiated Yes  ECG (Telemetry) Monitor On Yes  Prime Ordered Normal Saline  Length of  DialysisTreatment -hour(s) 3.5 Hour(s)  Dialysis Treatment Comments  (Na 140)  Dialyzer Elisio 17H NR  Dialysate 2K;2.5 Ca  Dialysate Flow Ordered 800  Blood Flow Rate Ordered 400 mL/min  Ultrafiltration Goal 2.5 Liters  Dialysis Blood Pressure Support Ordered Normal Saline

## 2019-05-17 NOTE — Care Management Important Message (Signed)
Important Message  Patient Details  Name: Daniel Thomas MRN: [id number] Date of Birth: [birth year]   Medicare Important Message Given:  Yes     [name] 05/17/2019, 1:35 PM

## 2019-05-17 NOTE — Progress Notes (Signed)
HD Complete    05/17/19 1600  Vital Signs  Temp 97.8 F (36.6 C)  Temp Source Oral  Pulse Rate 75  Pulse Rate Source Dinamap  Resp 17  BP (!) 214/84  BP Location Left Arm  BP Method Automatic  Patient Position (if appropriate) Lying  Oxygen Therapy  SpO2 99 %  O2 Device Room Air  During Hemodialysis Assessment  KECN 72.9 KECN  Dialysis Fluid Bolus Normal Saline  Bolus Amount (mL) 250 mL  Intra-Hemodialysis Comments Tx completed  Post-Hemodialysis Assessment  Rinseback Volume (mL) 250 mL  KECN 72.9 V  Dialyzer Clearance Lightly streaked  Duration of HD Treatment -hour(s) 3.5 hour(s)  Hemodialysis Intake (mL) 500 mL  UF Total -Machine (mL) 3009 mL  Net UF (mL) 2509 mL  Tolerated HD Treatment Yes  AVG/AVF Arterial Site Held (minutes)  (n/a)  AVG/AVF Venous Site Held (minutes)  (n/a)

## 2019-05-17 NOTE — Evaluation (Signed)
Physical Therapy Evaluation Patient Details Name: RUMEAL CULLIPHER MRN: [id number] DOB: [birth year] Today's Date: 05/17/2019   History of Present Illness  presented ot ER secondary to neck pain, SOB, fever; admitted for management sepsis related to R IJ perm-cath (removed 5/10), replaced with L perm-cath (5/13).  Clinical Impression  Upon evaluation, patient alert and oriented; follows commands and demonstrates good effort with mobility tasks.  Bilat UE/LE strength and ROM grossly symmetrical and WFL; no focal weakness appreciated.  Able to complete sit/stand, basic transfers and gait (400') without assist device, mod indep.  Demonstrates reciprocal stepping pattern, good trunk rotation and arm swing; mildly antalgic towards L, but no buckling, LOB or safety concern appreciated.  Cadence and overall gait speed grossly WFL (10' walk time, 8 seconds); appropriate for household and limited community distances. Appears to be at baseline level of functional ability; no acute PT needs identified at this time.  Will complete initial order; please re-consult should needs change.    Follow Up Recommendations No PT follow up    Equipment Recommendations       Recommendations for Other Services       Precautions / Restrictions Precautions Precautions: None Restrictions Weight Bearing Restrictions: No      Mobility  Bed Mobility               General bed mobility comments: seated in recliner beginning/end of treatment session  Transfers Overall transfer level: Independent Equipment used: None                Ambulation/Gait Ambulation/Gait assistance: Independent Gait Distance (Feet): 400 Feet Assistive device: None   Gait velocity: 10' walk time, 8 seconds   General Gait Details: reciprocal stepping pattern, good trunk rotation and arm swing; mildly antalgic towards L, but no buckling, LOB or safety concern appreciated  Stairs            Wheelchair Mobility     Modified Rankin (Stroke Patients Only)       Balance Overall balance assessment: Modified Independent                                           Pertinent Vitals/Pain Pain Assessment: No/denies pain    Home Living Family/patient expects to be discharged to:: Private residence Living Arrangements: Alone Available Help at Discharge: Family;Available PRN/intermittently Type of Home: House Home Access: Level entry     Home Layout: One level Home Equipment: None      Prior Function Level of Independence: Independent               Hand Dominance   Dominant Hand: Right    Extremity/Trunk Assessment   Upper Extremity Assessment Upper Extremity Assessment: Overall WFL for tasks assessed(grossly at least 4+/5 throughout; no focal weakness)    Lower Extremity Assessment Lower Extremity Assessment: Overall WFL for tasks assessed(grossly at least 4+/5 throughout; no focal weakness)       Communication   Communication: No difficulties  Cognition Arousal/Alertness: Awake/alert Behavior During Therapy: WFL for tasks assessed/performed Overall Cognitive Status: Within Functional Limits for tasks assessed                                        General Comments      Exercises     Assessment/Plan  PT Assessment Patent does not need any further PT services  PT Problem List         PT Treatment Interventions      PT Goals (Current goals can be found in the Care Plan section)  Acute Rehab PT Goals Patient Stated Goal: to return home PT Goal Formulation: All assessment and education complete, DC therapy Time For Goal Achievement: 05/17/19 Potential to Achieve Goals: Good    Frequency     Barriers to discharge        Co-evaluation               AM-PAC PT "6 Clicks" Mobility  Outcome Measure Help needed turning from your back to your side while in a flat bed without using bedrails?: None Help needed moving  from lying on your back to sitting on the side of a flat bed without using bedrails?: None Help needed moving to and from a bed to a chair (including a wheelchair)?: None Help needed standing up from a chair using your arms (e.g., wheelchair or bedside chair)?: None Help needed to walk in hospital room?: None Help needed climbing 3-5 steps with a railing? : None 6 Click Score: 24    End of Session Equipment Utilized During Treatment: Gait belt Activity Tolerance: Patient tolerated treatment well Patient left: in chair;with call bell/phone within reach(fall risk score 7, no alarm required) Nurse Communication: Mobility status      Time: 0370-4888 PT Time Calculation (min) (ACUTE ONLY): 9 min   Charges:   PT Evaluation $PT Eval Low Complexity: 1 Low         Avina Eberle H. Owens Shark, PT, DPT, NCS 05/17/19, 9:49 AM [phone]

## 2019-05-17 NOTE — Progress Notes (Signed)
[facility]  ROUNDING NOTE   Subjective:  Left IJ PermCath placed now. Patient in good spirits. Due for dialysis today.   Objective:  Vital signs in last 24 hours:  Temp:  [97.4 F (36.3 C)-98.4 F (36.9 C)] 97.8 F (36.6 C) (05/14 1205) Pulse Rate:  [69-89] 73 (05/14 1500) Resp:  [14-20] 14 (05/14 1215) BP: (147-222)/(72-102) 205/81 (05/14 1500) SpO2:  [97 %-100 %] 100 % (05/14 1500) Weight:  [72.6 kg-74.8 kg] 74.8 kg (05/14 0411)  Weight change: -2.525 kg Filed Weights   05/16/19 0500 05/16/19 1700 05/17/19 0411  Weight: 75.1 kg 72.6 kg 74.8 kg    Intake/Output: I/O last 3 completed shifts: In: -  Out: 400 [Urine:400]   Intake/Output this shift:  No intake/output data recorded.  Physical Exam: General: No acute distress  Head: Normocephalic, atraumatic. Moist oral mucosal membranes  Eyes: Anicteric  Neck: Supple, trachea midline  Lungs:  Clear to auscultation, normal effort  Heart: S1S2 no rubs  Abdomen:  Soft, nontender, bowel sounds present  Extremities: Trace peripheral edema.  Neurologic: Resting comfortably  Skin: No lesions  Access: Left IJ PermCath    Basic Metabolic Panel: Recent Labs  Lab 05/12/19 1809 05/12/19 1809 05/13/19 0547 05/13/19 0547 05/14/19 0519 05/15/19 0552 05/17/19 0336 05/17/19 1206  NA 133*  --  133*  --  132* 129* 134*  --   K 3.6  --  3.5  --  3.6 3.7 3.8  --   CL 94*  --  93*  --  93* 90* 96*  --   CO2 21*  --  25  --  25 24 26   --   GLUCOSE 120*  --  121*  --  109* 104* 146*  --   BUN 52*  --  57*  --  40* 57* 83*  --   CREATININE 7.23*  --  8.12*  --  6.32* 7.81* 9.72*  --   CALCIUM 9.4   < > 9.2   < > 9.0 8.9 9.1  --   PHOS  --   --   --   --   --   --  6.5* 2.9   < > = values in this interval not displayed.    Liver Function Tests: Recent Labs  Lab 05/12/19 1809 05/13/19 0547  AST 23 20  ALT 23 20  ALKPHOS 117 99  BILITOT 1.0 0.7  PROT 7.5 7.0  ALBUMIN 4.0 3.5   No results for  input(s): LIPASE, AMYLASE in the last 168 hours. No results for input(s): AMMONIA in the last 168 hours.  CBC: Recent Labs  Lab 05/12/19 1809 05/13/19 0547 05/14/19 0519 05/15/19 0552  WBC 21.4* 27.4* 15.1* 10.9*  NEUTROABS 19.3*  --  11.6* 7.3  HGB 11.7* 10.7* 10.7* 10.4*  HCT 35.1* 30.6* 31.5* 29.9*  MCV 89.1 86.9 88.5 86.2  PLT 316 273 263 210    Cardiac Enzymes: No results for input(s): CKTOTAL, CKMB, CKMBINDEX, TROPONINI in the last 168 hours.  BNP: Invalid input(s): POCBNP  CBG: Recent Labs  Lab 05/16/19 1705 05/16/19 2001 05/16/19 2045  GLUCAP 107* 119* 62*    Microbiology: Results for orders placed or performed during the hospital encounter of 05/12/19  Culture, blood (Routine x 2)     Status: Abnormal   Collection Time: 05/12/19  6:09 PM   Specimen: BLOOD  Result Value Ref Range Status   Specimen Description   Final    BLOOD LEFT FA Performed at [facility], [address], [place], [place] [address]    Special Requests   Final    BOTTLES DRAWN AEROBIC AND ANAEROBIC Blood Culture adequate volume Performed at [facility], [address] [place], [place] [address]    Culture  Setup Time   Final    GRAM POSITIVE COCCI IN BOTH AEROBIC AND ANAEROBIC BOTTLES CRITICAL VALUE NOTED.  VALUE IS CONSISTENT WITH PREVIOUSLY REPORTED AND CALLED VALUE. Performed at [facility], [address] [place], [place] [address]    Culture (A)  Final    STAPHYLOCOCCUS LUGDUNENSIS SUSCEPTIBILITIES PERFORMED ON PREVIOUS CULTURE WITHIN THE LAST 5 DAYS. Performed at [facility], [place] [address] [place], [place] [address]    Report Status 05/15/2019 FINAL  Final  Culture, blood (Routine x 2)     Status: Abnormal   Collection Time: 05/12/19  6:09 PM   Specimen: BLOOD  Result Value Ref Range Status   Specimen Description   Final    BLOOD L HAND Performed at [facility], [address] [place], [place] [address]     Special Requests   Final    BOTTLES DRAWN AEROBIC AND ANAEROBIC Blood Culture adequate volume Performed at [facility], [address], [place], [place] [address]    Culture  Setup Time   Final    GRAM POSITIVE COCCI IN BOTH AEROBIC AND ANAEROBIC BOTTLES CRITICAL RESULT CALLED TO, READ BACK BY AND VERIFIED WITH: CHRISTINE KATSOUDAS AT 7408 ON 05/13/19 SNG Performed at [facility], [place] [address] [place], [place] [address]    Culture STAPHYLOCOCCUS LUGDUNENSIS (A)  Final   Report Status 05/15/2019 FINAL  Final   Organism ID, Bacteria STAPHYLOCOCCUS LUGDUNENSIS  Final      Susceptibility   Staphylococcus lugdunensis - MIC*    CIPROFLOXACIN <=0.5 SENSITIVE Sensitive     ERYTHROMYCIN <=0.25 SENSITIVE Sensitive     GENTAMICIN <=0.5 SENSITIVE Sensitive     OXACILLIN 2 SENSITIVE Sensitive     TETRACYCLINE <=1 SENSITIVE Sensitive     VANCOMYCIN <=0.5 SENSITIVE Sensitive     TRIMETH/SULFA <=10 SENSITIVE Sensitive     CLINDAMYCIN <=0.25 SENSITIVE Sensitive     RIFAMPIN <=0.5 SENSITIVE Sensitive     Inducible Clindamycin NEGATIVE Sensitive     * STAPHYLOCOCCUS LUGDUNENSIS  Blood Culture ID Panel (Reflexed)     Status: Abnormal   Collection Time: 05/12/19  6:09 PM  Result Value Ref Range Status   Enterococcus species NOT DETECTED NOT DETECTED Final   Listeria monocytogenes NOT DETECTED NOT DETECTED Final   Staphylococcus species DETECTED (A) NOT DETECTED Final    Comment: Methicillin (oxacillin) susceptible coagulase negative staphylococcus. Possible blood culture contaminant (unless isolated from more than one blood culture draw or clinical case suggests pathogenicity). No antibiotic treatment is indicated for blood  culture contaminants. CRITICAL RESULT CALLED TO, READ BACK BY AND VERIFIED WITH: CHRISTINE KATSOUDAS AT 8563 ON 05/13/19 SNG    Staphylococcus aureus (BCID) NOT DETECTED NOT DETECTED Final   Methicillin resistance NOT DETECTED NOT DETECTED Final    Streptococcus species NOT DETECTED NOT DETECTED Final   Streptococcus agalactiae NOT DETECTED NOT DETECTED Final   Streptococcus pneumoniae NOT DETECTED NOT DETECTED Final   Streptococcus pyogenes NOT DETECTED NOT DETECTED Final   Acinetobacter baumannii NOT DETECTED NOT DETECTED Final   Enterobacteriaceae species NOT DETECTED NOT DETECTED Final   Enterobacter cloacae complex NOT DETECTED NOT DETECTED Final   Escherichia coli NOT DETECTED NOT DETECTED Final  Klebsiella oxytoca NOT DETECTED NOT DETECTED Final   Klebsiella pneumoniae NOT DETECTED NOT DETECTED Final   Proteus species NOT DETECTED NOT DETECTED Final   Serratia marcescens NOT DETECTED NOT DETECTED Final   Haemophilus influenzae NOT DETECTED NOT DETECTED Final   Neisseria meningitidis NOT DETECTED NOT DETECTED Final   Pseudomonas aeruginosa NOT DETECTED NOT DETECTED Final   Candida albicans NOT DETECTED NOT DETECTED Final   Candida glabrata NOT DETECTED NOT DETECTED Final   Candida krusei NOT DETECTED NOT DETECTED Final   Candida parapsilosis NOT DETECTED NOT DETECTED Final   Candida tropicalis NOT DETECTED NOT DETECTED Final    Comment: Performed at [facility], [address] [place], [place] [address]  Respiratory Panel by RT PCR (Flu A&B, Covid) - Nasopharyngeal Swab     Status: None   Collection Time: 05/12/19  6:10 PM   Specimen: Nasopharyngeal Swab  Result Value Ref Range Status   SARS Coronavirus 2 by RT PCR NEGATIVE NEGATIVE Final    Comment: (NOTE) SARS-CoV-2 target nucleic acids are NOT DETECTED. The SARS-CoV-2 RNA is generally detectable in upper respiratoy specimens during the acute phase of infection. The lowest concentration of SARS-CoV-2 viral copies this assay can detect is 131 copies/mL. A negative result does not preclude SARS-Cov-2 infection and should not be used as the sole basis for treatment or other patient management decisions. A negative result may occur with  improper specimen  collection/handling, submission of specimen other than nasopharyngeal swab, presence of viral mutation(s) within the areas targeted by this assay, and inadequate number of viral copies (<131 copies/mL). A negative result must be combined with clinical observations, patient history, and epidemiological information. The expected result is Negative. Fact Sheet for Patients:  PinkCheek.be Fact Sheet for Healthcare Providers:  GravelBags.it This test is not yet ap proved or cleared by the [place] FDA and  has been authorized for detection and/or diagnosis of SARS-CoV-2 by FDA under an Emergency Use Authorization (EUA). This EUA will remain  in effect (meaning this test can be used) for the duration of the COVID-19 declaration under Section 564(b)(1) of the Act, 21 U.S.C. section 360bbb-3(b)(1), unless the authorization is terminated or revoked sooner.    Influenza A by PCR NEGATIVE NEGATIVE Final   Influenza B by PCR NEGATIVE NEGATIVE Final    Comment: (NOTE) The Xpert Xpress SARS-CoV-2/FLU/RSV assay is intended as an aid in  the diagnosis of influenza from Nasopharyngeal swab specimens and  should not be used as a sole basis for treatment. Nasal washings and  aspirates are unacceptable for Xpert Xpress SARS-CoV-2/FLU/RSV  testing. Fact Sheet for Patients: PinkCheek.be Fact Sheet for Healthcare Providers: GravelBags.it This test is not yet approved or cleared by the [place] FDA and  has been authorized for detection and/or diagnosis of SARS-CoV-2 by  FDA under an Emergency Use Authorization (EUA). This EUA will remain  in effect (meaning this test can be used) for the duration of the  Covid-19 declaration under Section 564(b)(1) of the Act, 21  U.S.C. section 360bbb-3(b)(1), unless the authorization is  terminated or revoked. Performed at [facility],  [address] [place], [place] [address]   MRSA PCR Screening     Status: None   Collection Time: 05/13/19 12:04 AM   Specimen: Nasopharyngeal  Result Value Ref Range Status   MRSA by PCR NEGATIVE NEGATIVE Final    Comment:        The GeneXpert MRSA Assay (FDA approved for NASAL specimens only), is  one component of a comprehensive MRSA colonization surveillance program. It is not intended to diagnose MRSA infection nor to guide or monitor treatment for MRSA infections. Performed at [facility], [address] [place], [place] [address]   CULTURE, BLOOD (ROUTINE X 2) w Reflex to ID Panel     Status: None (Preliminary result)   Collection Time: 05/14/19 10:36 AM   Specimen: BLOOD RIGHT ARM  Result Value Ref Range Status   Specimen Description BLOOD RIGHT ARM  Final   Special Requests   Final    BOTTLES DRAWN AEROBIC AND ANAEROBIC Blood Culture adequate volume   Culture   Final    NO GROWTH 3 DAYS Performed at [facility], [address], [place], [place] [address]    Report Status PENDING  Incomplete  CULTURE, BLOOD (ROUTINE X 2) w Reflex to ID Panel     Status: None (Preliminary result)   Collection Time: 05/14/19 10:43 AM   Specimen: BLOOD LEFT ARM  Result Value Ref Range Status   Specimen Description BLOOD LEFT ARM  Final   Special Requests   Final    BOTTLES DRAWN AEROBIC AND ANAEROBIC Blood Culture adequate volume   Culture   Final    NO GROWTH 3 DAYS Performed at [facility], [address], [place], [place] [address]    Report Status PENDING  Incomplete    Coagulation Studies: No results for input(s): LABPROT, INR in the last 72 hours.  Urinalysis: No results for input(s): COLORURINE, LABSPEC, PHURINE, GLUCOSEU, HGBUR, BILIRUBINUR, KETONESUR, PROTEINUR, UROBILINOGEN, NITRITE, LEUKOCYTESUR in the last 72 hours.  Invalid input(s): APPERANCEUR    Imaging: PERIPHERAL VASCULAR CATHETERIZATION  Result Date: 05/16/2019 See  op note    Medications:   . sodium chloride    . sodium chloride    . sodium chloride 10 mL/hr at 05/15/19 0833  .  ceFAZolin (ANCEF) IV Stopped (05/15/19 1826)   . amLODipine  10 mg Oral Daily  . carvedilol  12.5 mg Oral BID WC  . Chlorhexidine Gluconate Cloth  6 each Topical Daily  . citalopram  20 mg Oral Daily  . ferric citrate  210 mg Oral TID WC  . heparin  5,000 Units Subcutaneous Q8H  . hydrALAZINE  25 mg Oral Q8H  . linagliptin  5 mg Oral Daily  . magic mouthwash  10 mL Oral QID  . pravastatin  40 mg Oral q1800  . senna-docusate  2 tablet Oral BID   sodium chloride, sodium chloride, sodium chloride, acetaminophen **OR** acetaminophen, alteplase, aspirin-acetaminophen-caffeine, calcium carbonate, camphor-menthol **AND** hydrOXYzine, feeding supplement (NEPRO CARB STEADY), heparin, HYDROmorphone (DILAUDID) injection, lidocaine (PF), lidocaine-prilocaine, ondansetron **OR** ondansetron (ZOFRAN) IV, ondansetron (ZOFRAN) IV, pentafluoroprop-tetrafluoroeth, sorbitol, traZODone  Assessment/ Plan:  64 y.o. male with past medical history of COPD, diabetes mellitus type 2, BPH, hypertension, hyperlipidemia, ESRD on HD TTS, anemia of chronic kidney disease, secondary hyperparathyroidism who was admitted with fever and suspected sepsis.  [facility] [address]/TTS  1.  ESRD on HD TTS.  Patient underwent PermCath placement yesterday.  Appreciate vascular surgery assistance.  We are planning for dialysis treatment today.  2.  Fever/sepsis with MSSA.  PermCath removed.  TEE negative.  Maintain the patient on cefazolin as per infectious disease.  3.  Anemia of chronic kidney disease.  Patient to resume Mircera as an outpatient.  4.  Secondary hyperparathyroidism.  Phosphorus expectedly high as he has been off dialysis for a number of days.  Phosphorus currently 6.5.  Should improve  with ongoing dialysis treatments.  Maintain the patient on Auryxia otherwise.   LOS: 5 Eryca Bolte 5/14/20213:01 PM

## 2019-05-17 NOTE — Progress Notes (Signed)
Post Hd    05/17/19 1600  Vital Signs  Temp 97.8 F (36.6 C)  Temp Source Oral  Pulse Rate 75  Pulse Rate Source Dinamap  Resp 17  BP (!) 214/84  BP Location Left Arm  BP Method Automatic  Patient Position (if appropriate) Lying  Oxygen Therapy  SpO2 99 %  O2 Device Room Air  During Hemodialysis Assessment  KECN 72.9 KECN  Dialysis Fluid Bolus Normal Saline  Bolus Amount (mL) 250 mL  Intra-Hemodialysis Comments Tx completed  Post-Hemodialysis Assessment  Rinseback Volume (mL) 250 mL  KECN 72.9 V  Dialyzer Clearance Lightly streaked  Duration of HD Treatment -hour(s) 3.5 hour(s)  Hemodialysis Intake (mL) 500 mL  UF Total -Machine (mL) 3009 mL  Net UF (mL) 2509 mL  Tolerated HD Treatment Yes  AVG/AVF Arterial Site Held (minutes)  (n/a)  AVG/AVF Venous Site Held (minutes)  (n/a)

## 2019-05-17 NOTE — Discharge Summary (Signed)
Daniel Thomas BEE:[id number] DOB: [birth year] DOA: 05/12/2019  PCP: Marguerita Merles, MD  Admit date: 05/12/2019 Discharge date: 05/17/2019  Admitted From: home Disposition:  home  Recommendations for Outpatient Follow-up:  1. Follow up with PCP in 1 week 2. Please obtain BMP/CBC in one week 3. Follow-up with your primary nephrologist 4. Follow-up with hemodialysis on Tuesday Thursday Saturday 5. Should be receiving cefazolin 2 g IV on Tuesday, 2 g IV on Thursday, and 3 g IV on Saturday during dialysis until 06/08/2019 6. Needs weekly CBC with differential and a CMP while on IV antibiotics, please fax weekly labs to [facility] [phone] 7. Follow-up with infectious disease Dr.Ravishankar in 4 weeks  Home Health: Yes   Discharge Condition:Stable CODE STATUS: Full Diet recommendation: Heart Healthy Neysa Hotter diet  Brief/Interim Summary: Daniel Thomas is a 64 y.o. male with medical history significant of end-stage renal disease on hemodialysis on Tuesdays Thursdays and Saturdays, hypertension, diabetes currently not on medications, hypertension, hyperlipidemia and BPH who came to the ER complaining of neck pain shortness of breath and fever.  started having these fevers and chills and then the neck pain.   He had a tunneled catheter has been in place for a year.  Observation of the catheter site showed several skin rashes but no obvious purulent discharge. Mar[place] KitchenCOVID-19 screen is negative.  Patient was found with staph Lugdunensis bacteremia likely from line infection.    #1. Staph lugdunensis bacteremia/septicemia. Secondary to line infection. Permacath removed. Repeat blood cultures  so far negative. He is now status post new permacath placement on 05/16/2019 ID following.recommend Cefazolin 2 grams IV on Tuesday, 2 grams IV on Thursday and 3 grams IV on Saturday during dialysis Until 06/08/19 weekly cbc, bmp to be fax to Dr. Hosie Poisson office TEE  neg. For endocarditis  #2. Neck  pain. CT scan of the neck showed degenerative disc disease, no evidence of osteomyelitis. Most likely due to osteoarthritis. Symptomatic treatment.  3. End-stage renal disease. Followed by nephrology All permacath removed after bacteremia.  Since repeat blood cultures were negative he had a new permacath placed on 5/13 by Dr. Lucky Cowboy HD today   4. Benign prostate hypertrophy. Continue home medicines.  5. Essential hypertension.  Continue home medicine.  6. Constipation.  Was given bowel regimen  7. Anemia of chronic kidney disease. Consider restarting Epogen once back on dialysis  8.Hyponatremia.- likely2/2  vol. Overload. Now improved.   Discharge Diagnoses:  Principal Problem:   Sepsis ([place]) Active Problems:   Malignant hypertension   ESRD (end stage renal disease) ([place])   Leucocytosis   Septicemia due to coagulase-negative staphylococcal infection [facility])    Discharge Instructions  Discharge Instructions    Call MD for:  temperature >100.4   Complete by: As directed    Diet - low sodium heart healthy   Complete by: As directed    Discharge instructions   Complete by: As directed    Follow up with your primary neprologist. You will receive antibiotics during hemodialysis, make sure they give it to you.   Home infusion instructions   Complete by: As directed    Instructions: Flushing of vascular access device: 0.9% NaCl pre/post medication administration and prn patency; Heparin 100 u/ml, 34ml for implanted ports and Heparin 10u/ml, 59ml for all other central venous catheters.   Increase activity slowly   Complete by: As directed      Allergies as of 05/17/2019   No Known Allergies     Medication List  STOP taking these medications   metoprolol tartrate 50 MG tablet Commonly known as: LOPRESSOR   torsemide 20 MG tablet Commonly known as: DEMADEX     TAKE these medications   amLODipine 10 MG tablet Commonly known as: NORVASC Take 1 tablet (10  mg total) by mouth daily.   atorvastatin 40 MG tablet Commonly known as: LIPITOR   Auryxia 1 GM 210 MG(Fe) tablet Generic drug: ferric citrate Take by mouth.   calcium carbonate 500 MG chewable tablet Commonly known as: TUMS - dosed in mg elemental calcium Chew 2.5 tablets (500 mg of elemental calcium total) by mouth every 6 (six) hours as needed for indigestion.   carvedilol 12.5 MG tablet Commonly known as: COREG Take 12.5 mg by mouth 2 (two) times daily with a meal.   ceFAZolin  IVPB Commonly known as: ANCEF Inject 2-3 g into the vein Every Tuesday,Thursday,and Saturday with dialysis for 23 days. Give cefazolin 2gm on Tue and Thur and 3gm on Saturdays at end of hemodialysis Indication: S. lugdunensis bacteremia Last Day of Therapy: 06/08/2019 Labs - Once weekly:  CBC/D and CMP   ceFAZolin 1-4 GM/50ML-% Soln Commonly known as: ANCEF Inject 50 mLs (1 g total) into the vein daily.   citalopram 20 MG tablet Commonly known as: CELEXA Take 20 mg by mouth daily.   Drisdol 1.25 MG (50000 UT) capsule Generic drug: ergocalciferol Take by mouth. Administered at analysis   feeding supplement (GLUCERNA SHAKE) [facility]   hydrALAZINE 25 MG tablet Commonly known as: APRESOLINE Take 1 tablet (25 mg total) by mouth every 8 (eight) hours. What changed: Another medication with the same name was removed. Continue taking this medication, and follow the directions you see here.   linagliptin 5 MG Tabs tablet Commonly known as: TRADJENTA Take 1 tablet (5 mg total) by mouth daily.   lovastatin 40 MG tablet Commonly known as: MEVACOR Take 1 tablet (40 mg total) by mouth at bedtime. What changed: See the new instructions.   MIRCERA IJ Mircera   traZODone 50 MG tablet Commonly known as: DESYREL Take 25 mg by mouth at bedtime as needed for sleep.            Home Infusion Instuctions  (From admission, onward)         Start     Ordered   05/17/19 0000  Home infusion  instructions    Question:  Instructions  Answer:  Flushing of vascular access device: 0.9% NaCl pre/post medication administration and prn patency; Heparin 100 u/ml, 46ml for implanted ports and Heparin 10u/ml, 62ml for all other central venous catheters.   05/17/19 1314         Follow-up Information    Tsosie Billing, MD Follow up in 4 week(s).   Specialty: Infectious Diseases Contact information: [place] [address] [phone]        Marguerita Merles, MD Follow up in 1 week(s).   Specialty: Family Medicine Contact information: [place] [address] [phone]          No Known Allergies  Consultations:  Vascular, nephrology, ID   Procedures/Studies: MR CERVICAL SPINE WO CONTRAST  Result Date: 05/13/2019 CLINICAL DATA:  Darrick Grinder initial evaluation for acute sepsis, concern for discitis. EXAM: MRI CERVICAL SPINE WITHOUT CONTRAST TECHNIQUE: Multiplanar, multisequence MR imaging of the cervical spine was performed. No intravenous contrast was administered. COMPARISON:  None available. FINDINGS: Alignment: Straightening of the normal cervical lordosis. No listhesis or subluxation. Vertebrae:  Vertebral body height maintained without evidence for acute or chronic fracture. Bone marrow signal intensity within normal limits. No discrete or worrisome osseous lesions. Discogenic reactive endplate changes present about the C3-4 interspace. No other abnormal marrow edema. No findings to suggest osteomyelitis discitis or septic arthritis. Cord: Signal intensity within the cervical spinal cord is normal. No epidural collections identified. Posterior Fossa, vertebral arteries, paraspinal tissues: Note made of a empty sella. Visualized brain and posterior fossa otherwise unremarkable. Sphenoid sinus disease partially visualized. Craniocervical junction normal. Paraspinous and prevertebral soft tissues within normal limits. Normal flow voids seen  within the vertebral arteries bilaterally. Disc levels: C2-C3: Mild uncovertebral hypertrophy without significant disc bulge. No canal or foraminal stenosis. C3-C4: Chronic intervertebral disc space narrowing with diffuse degenerative disc osteophyte. Flattening and effacement of the ventral thecal sac with resultant moderate spinal stenosis. Mild cord flattening without cord signal changes. Severe bilateral C4 foraminal stenosis. C4-C5: Mild disc bulge with uncovertebral hypertrophy. No significant spinal stenosis. Mild bilateral C5 foraminal narrowing. C5-C6: Mild disc bulge with uncovertebral spurring. No significant spinal stenosis. Mild left C6 foraminal stenosis. No significant right foraminal narrowing. C6-C7: Normal interspace. Mild facet degeneration on the right. No canal or foraminal stenosis. C7-T1:  Unremarkable. Visualized upper thoracic spine demonstrates no significant finding. IMPRESSION: 1. No evidence for osteomyelitis discitis or septic arthritis within the cervical spine. No other acute infection identified. 2. Degenerative disc osteophyte at C3-4 with resultant moderate canal with severe bilateral C4 foraminal stenosis. 3. Mild bilateral C5 and left C6 foraminal narrowing related to disc bulge and uncovertebral disease. Electronically Signed   By: Jeannine Boga M.D.   On: 05/13/2019 03:11   PERIPHERAL VASCULAR CATHETERIZATION  Result Date: 05/16/2019 See op note  PERIPHERAL VASCULAR CATHETERIZATION  Result Date: 05/13/2019 See op note  DG Chest Portable 1 View  Result Date: 05/12/2019 CLINICAL DATA:  Sepsis EXAM: PORTABLE CHEST 1 VIEW COMPARISON:  04/17/2018 FINDINGS: There is a well-positioned tunneled dialysis catheter on the right. The heart size is stable. There are few hazy airspace opacities in the lung bases bilaterally favored to represent areas of atelectasis. There is no pneumothorax. No pleural effusion. There is mild vascular congestion without overt pulmonary  edema. There is no acute osseous abnormality. IMPRESSION: No active disease. Electronically Signed   By: Constance Holster M.D.   On: 05/12/2019 18:54   ECHO TEE  Result Date: 05/15/2019    TRANSESOPHOGEAL ECHO REPORT   Patient Name:   Daniel Thomas Date of Exam: 05/15/2019 Medical Rec #:  [id number]      Height:       59.0 in Accession #:    [id number]     Weight:       163.8 lb Date of Birth:  1955-12-24      BSA:          1.694 m Patient Age:    25 years       BP:           204/87 mmHg Patient Gender: M              HR:           85 bpm. Exam Location:  [facility] Procedure: Transesophageal Echo, Color Doppler and Saline Contrast Bubble Study Indications:     R78.81 Bacteremia  History:         Patient has prior history of Echocardiogram examinations. COPD  and ESRD; Risk Factors:Hypertension, HCL and Diabetes.  Sonographer:     Charmayne Sheer RDCS (AE) Referring Phys:  [place] Diagnosing Phys: Bartholome Bill MD PROCEDURE: TEE procedure time was 18 minutes. The transesophogeal probe was passed without difficulty through the esophogus of the patient. Imaged were obtained with the patient in a left lateral decubitus position. Local oropharyngeal anesthetic was provided with Benzocaine spray and viscous lidocaine. Sedation performed by performing physician. Image quality was excellent. The patient's vital signs; including heart rate, blood pressure, and oxygen saturation; remained stable throughout the procedure. The patient developed no complications during the procedure. Chordae thickening. No obvious vegetations. IMPRESSIONS  1. Left ventricular ejection fraction, by estimation, is 55 to 60%. The left ventricle has normal function. The left ventricle has no regional wall motion abnormalities.  2. Right ventricular systolic function is normal. The right ventricular size is normal.  3. No left atrial/left atrial appendage thrombus was detected.  4. The mitral valve is grossly normal. Trivial  mitral valve regurgitation.  5. The aortic valve is tricuspid. Aortic valve regurgitation is trivial. No aortic stenosis is present. FINDINGS  Left Ventricle: Left ventricular ejection fraction, by estimation, is 55 to 60%. The left ventricle has normal function. The left ventricle has no regional wall motion abnormalities. The left ventricular internal cavity size was normal in size. There is  no left ventricular hypertrophy. Right Ventricle: The right ventricular size is normal. No increase in right ventricular wall thickness. Right ventricular systolic function is normal. Left Atrium: Left atrial size was normal in size. No left atrial/left atrial appendage thrombus was detected. Right Atrium: Right atrial size was normal in size. Pericardium: There is no evidence of pericardial effusion. Mitral Valve: The mitral valve is grossly normal. Normal mobility of the mitral valve leaflets. Trivial mitral valve regurgitation. Tricuspid Valve: The tricuspid valve is grossly normal. Tricuspid valve regurgitation is trivial. Aortic Valve: The aortic valve is tricuspid. Aortic valve regurgitation is trivial. No aortic stenosis is present. Pulmonic Valve: The pulmonic valve was not well visualized. Pulmonic valve regurgitation is trivial. Aorta: The aortic root is normal in size and structure. IAS/Shunts: No atrial level shunt detected by color flow Doppler. Agitated saline contrast was given intravenously to evaluate for intracardiac shunting. Bartholome Bill MD Electronically signed by Bartholome Bill MD Signature Date/Time: 05/15/2019/9:51:13 AM    Final        Subjective: Feels better, no complaints  Discharge Exam: Vitals:   05/16/19 2355 05/17/19 0516  BP: (!) 157/72 (!) 170/85  Pulse: 78 78  Resp: 18 20  Temp: (!) 97.4 F (36.3 C) 97.7 F (36.5 C)  SpO2: 97% 100%   Vitals:   05/16/19 2033 05/16/19 2355 05/17/19 0411 05/17/19 0516  BP: (!) 175/79 (!) 157/72  (!) 170/85  Pulse: 76 78  78  Resp: 18 18  20    Temp: 98.1 F (36.7 C) (!) 97.4 F (36.3 C)  97.7 F (36.5 C)  TempSrc: Oral Oral  Oral  SpO2: 99% 97%  100%  Weight:   74.8 kg   Height:        General: Pt is alert, awake, not in acute distress Cardiovascular: RRR, S1/S2 +, no rubs, no gallops Respiratory: CTA bilaterally, no wheezing, no rhonchi Abdominal: Soft, NT, ND, bowel sounds + Extremities: no edema, no cyanosis    The results of significant diagnostics from this hospitalization (including imaging, microbiology, ancillary and laboratory) are listed below for reference.     Microbiology: Recent Results (  from the past 240 hour(s))  Culture, blood (Routine x 2)     Status: Abnormal   Collection Time: 05/12/19  6:09 PM   Specimen: BLOOD  Result Value Ref Range Status   Specimen Description   Final    BLOOD LEFT FA Performed at [facility], [address], [place], [place] [address]    Special Requests   Final    BOTTLES DRAWN AEROBIC AND ANAEROBIC Blood Culture adequate volume Performed at [facility], [address], [place], [place] [address]    Culture  Setup Time   Final    GRAM POSITIVE COCCI IN BOTH AEROBIC AND ANAEROBIC BOTTLES CRITICAL VALUE NOTED.  VALUE IS CONSISTENT WITH PREVIOUSLY REPORTED AND CALLED VALUE. Performed at [facility], [address] [place], [place] [address]    Culture (A)  Final    STAPHYLOCOCCUS LUGDUNENSIS SUSCEPTIBILITIES PERFORMED ON PREVIOUS CULTURE WITHIN THE LAST 5 DAYS. Performed at [facility], [place] [address], [place], [place] [address]    Report Status 05/15/2019 FINAL  Final  Culture, blood (Routine x 2)     Status: Abnormal   Collection Time: 05/12/19  6:09 PM   Specimen: BLOOD  Result Value Ref Range Status   Specimen Description   Final    BLOOD L HAND Performed at [facility], [address] [place], [place] [address]    Special Requests   Final    BOTTLES DRAWN AEROBIC AND ANAEROBIC Blood Culture adequate  volume Performed at [facility], [address] [place], [place] [address]    Culture  Setup Time   Final    GRAM POSITIVE COCCI IN BOTH AEROBIC AND ANAEROBIC BOTTLES CRITICAL RESULT CALLED TO, READ BACK BY AND VERIFIED WITH: CHRISTINE KATSOUDAS AT 3734 ON 05/13/19 SNG Performed at [facility], [place] [address] [place],  [address]    Culture STAPHYLOCOCCUS LUGDUNENSIS (A)  Final   Report Status 05/15/2019 FINAL  Final   Organism ID, Bacteria STAPHYLOCOCCUS LUGDUNENSIS  Final      Susceptibility   Staphylococcus lugdunensis - MIC*    CIPROFLOXACIN <=0.5 SENSITIVE Sensitive     ERYTHROMYCIN <=0.25 SENSITIVE Sensitive     GENTAMICIN <=0.5 SENSITIVE Sensitive     OXACILLIN 2 SENSITIVE Sensitive     TETRACYCLINE <=1 SENSITIVE Sensitive     VANCOMYCIN <=0.5 SENSITIVE Sensitive     TRIMETH/SULFA <=10 SENSITIVE Sensitive     CLINDAMYCIN <=0.25 SENSITIVE Sensitive     RIFAMPIN <=0.5 SENSITIVE Sensitive     Inducible Clindamycin NEGATIVE Sensitive     * STAPHYLOCOCCUS LUGDUNENSIS  Blood Culture ID Panel (Reflexed)     Status: Abnormal   Collection Time: 05/12/19  6:09 PM  Result Value Ref Range Status   Enterococcus species NOT DETECTED NOT DETECTED Final   Listeria monocytogenes NOT DETECTED NOT DETECTED Final   Staphylococcus species DETECTED (A) NOT DETECTED Final    Comment: Methicillin (oxacillin) susceptible coagulase negative staphylococcus. Possible blood culture contaminant (unless isolated from more than one blood culture draw or clinical case suggests pathogenicity). No antibiotic treatment is indicated for blood  culture contaminants. CRITICAL RESULT CALLED TO, READ BACK BY AND VERIFIED WITH: CHRISTINE KATSOUDAS AT 1157 ON 05/13/19 SNG    Staphylococcus aureus (BCID) NOT DETECTED NOT DETECTED Final   Methicillin resistance NOT DETECTED NOT DETECTED Final   Streptococcus species NOT DETECTED NOT DETECTED Final   Streptococcus agalactiae NOT DETECTED NOT  DETECTED Final   Streptococcus pneumoniae NOT DETECTED  NOT DETECTED Final   Streptococcus pyogenes NOT DETECTED NOT DETECTED Final   Acinetobacter baumannii NOT DETECTED NOT DETECTED Final   Enterobacteriaceae species NOT DETECTED NOT DETECTED Final   Enterobacter cloacae complex NOT DETECTED NOT DETECTED Final   Escherichia coli NOT DETECTED NOT DETECTED Final   Klebsiella oxytoca NOT DETECTED NOT DETECTED Final   Klebsiella pneumoniae NOT DETECTED NOT DETECTED Final   Proteus species NOT DETECTED NOT DETECTED Final   Serratia marcescens NOT DETECTED NOT DETECTED Final   Haemophilus influenzae NOT DETECTED NOT DETECTED Final   Neisseria meningitidis NOT DETECTED NOT DETECTED Final   Pseudomonas aeruginosa NOT DETECTED NOT DETECTED Final   Candida albicans NOT DETECTED NOT DETECTED Final   Candida glabrata NOT DETECTED NOT DETECTED Final   Candida krusei NOT DETECTED NOT DETECTED Final   Candida parapsilosis NOT DETECTED NOT DETECTED Final   Candida tropicalis NOT DETECTED NOT DETECTED Final    Comment: Performed at [facility], [address] [place], [place] [address]  Respiratory Panel by RT PCR (Flu A&B, Covid) - Nasopharyngeal Swab     Status: None   Collection Time: 05/12/19  6:10 PM   Specimen: Nasopharyngeal Swab  Result Value Ref Range Status   SARS Coronavirus 2 by RT PCR NEGATIVE NEGATIVE Final    Comment: (NOTE) SARS-CoV-2 target nucleic acids are NOT DETECTED. The SARS-CoV-2 RNA is generally detectable in upper respiratoy specimens during the acute phase of infection. The lowest concentration of SARS-CoV-2 viral copies this assay can detect is 131 copies/mL. A negative result does not preclude SARS-Cov-2 infection and should not be used as the sole basis for treatment or other patient management decisions. A negative result may occur with  improper specimen collection/handling, submission of specimen other than nasopharyngeal swab, presence of viral  mutation(s) within the areas targeted by this assay, and inadequate number of viral copies (<131 copies/mL). A negative result must be combined with clinical observations, patient history, and epidemiological information. The expected result is Negative. Fact Sheet for Patients:  PinkCheek.be Fact Sheet for Healthcare Providers:  GravelBags.it This test is not yet ap proved or cleared by the [place] FDA and  has been authorized for detection and/or diagnosis of SARS-CoV-2 by FDA under an Emergency Use Authorization (EUA). This EUA will remain  in effect (meaning this test can be used) for the duration of the COVID-19 declaration under Section 564(b)(1) of the Act, 21 U.S.C. section 360bbb-3(b)(1), unless the authorization is terminated or revoked sooner.    Influenza A by PCR NEGATIVE NEGATIVE Final   Influenza B by PCR NEGATIVE NEGATIVE Final    Comment: (NOTE) The Xpert Xpress SARS-CoV-2/FLU/RSV assay is intended as an aid in  the diagnosis of influenza from Nasopharyngeal swab specimens and  should not be used as a sole basis for treatment. Nasal washings and  aspirates are unacceptable for Xpert Xpress SARS-CoV-2/FLU/RSV  testing. Fact Sheet for Patients: PinkCheek.be Fact Sheet for Healthcare Providers: GravelBags.it This test is not yet approved or cleared by the [place] FDA and  has been authorized for detection and/or diagnosis of SARS-CoV-2 by  FDA under an Emergency Use Authorization (EUA). This EUA will remain  in effect (meaning this test can be used) for the duration of the  Covid-19 declaration under Section 564(b)(1) of the Act, 21  U.S.C. section 360bbb-3(b)(1), unless the authorization is  terminated or revoked. Performed at [facility], [address] [place], [place] [address]   MRSA PCR Screening  Status: None    Collection Time: 05/13/19 12:04 AM   Specimen: Nasopharyngeal  Result Value Ref Range Status   MRSA by PCR NEGATIVE NEGATIVE Final    Comment:        The GeneXpert MRSA Assay (FDA approved for NASAL specimens only), is one component of a comprehensive MRSA colonization surveillance program. It is not intended to diagnose MRSA infection nor to guide or monitor treatment for MRSA infections. Performed at [facility], [address] [place], [place] [address]   CULTURE, BLOOD (ROUTINE X 2) w Reflex to ID Panel     Status: None (Preliminary result)   Collection Time: 05/14/19 10:36 AM   Specimen: BLOOD RIGHT ARM  Result Value Ref Range Status   Specimen Description BLOOD RIGHT ARM  Final   Special Requests   Final    BOTTLES DRAWN AEROBIC AND ANAEROBIC Blood Culture adequate volume   Culture   Final    NO GROWTH 3 DAYS Performed at [facility], [address] [place], [place] [address]    Report Status PENDING  Incomplete  CULTURE, BLOOD (ROUTINE X 2) w Reflex to ID Panel     Status: None (Preliminary result)   Collection Time: 05/14/19 10:43 AM   Specimen: BLOOD LEFT ARM  Result Value Ref Range Status   Specimen Description BLOOD LEFT ARM  Final   Special Requests   Final    BOTTLES DRAWN AEROBIC AND ANAEROBIC Blood Culture adequate volume   Culture   Final    NO GROWTH 3 DAYS Performed at [facility], [address], [place], [place] [address]    Report Status PENDING  Incomplete     Labs: BNP (last 3 results) No results for input(s): BNP in the last 8760 hours. Basic Metabolic Panel: Recent Labs  Lab 05/12/19 1809 05/13/19 0547 05/14/19 0519 05/15/19 0552 05/17/19 0336  NA 133* 133* 132* 129* 134*  K 3.6 3.5 3.6 3.7 3.8  CL 94* 93* 93* 90* 96*  CO2 21* 25 25 24 26   GLUCOSE 120* 121* 109* 104* 146*  BUN 52* 57* 40* 57* 83*  CREATININE 7.23* 8.12* 6.32* 7.81* 9.72*  CALCIUM 9.4 9.2 9.0 8.9 9.1  PHOS  --   --   --   --  6.5*    Liver Function Tests: Recent Labs  Lab 05/12/19 1809 05/13/19 0547  AST 23 20  ALT 23 20  ALKPHOS 117 99  BILITOT 1.0 0.7  PROT 7.5 7.0  ALBUMIN 4.0 3.5   No results for input(s): LIPASE, AMYLASE in the last 168 hours. No results for input(s): AMMONIA in the last 168 hours. CBC: Recent Labs  Lab 05/12/19 1809 05/13/19 0547 05/14/19 0519 05/15/19 0552  WBC 21.4* 27.4* 15.1* 10.9*  NEUTROABS 19.3*  --  11.6* 7.3  HGB 11.7* 10.7* 10.7* 10.4*  HCT 35.1* 30.6* 31.5* 29.9*  MCV 89.1 86.9 88.5 86.2  PLT 316 273 263 210   Cardiac Enzymes: No results for input(s): CKTOTAL, CKMB, CKMBINDEX, TROPONINI in the last 168 hours. BNP: Invalid input(s): POCBNP CBG: Recent Labs  Lab 05/16/19 1705 05/16/19 2001 05/16/19 2045  GLUCAP 107* 119* 118*   D-Dimer No results for input(s): DDIMER in the last 72 hours. Hgb A1c No results for input(s): HGBA1C in the last 72 hours. Lipid Profile No results for input(s): CHOL, HDL, LDLCALC, TRIG, CHOLHDL, LDLDIRECT in the last 72 hours. Thyroid function studies No results for input(s): TSH, T4TOTAL, T3FREE, THYROIDAB in the last 72  hours.  Invalid input(s): FREET3 Anemia work up No results for input(s): VITAMINB12, FOLATE, FERRITIN, TIBC, IRON, RETICCTPCT in the last 72 hours. Urinalysis    Component Value Date/Time   COLORURINE COLORLESS (A) 01/07/2018 1230   APPEARANCEUR CLEAR (A) 01/07/2018 1230   LABSPEC 1.006 01/07/2018 1230   PHURINE 5.0 01/07/2018 1230   GLUCOSEU 50 (A) 01/07/2018 1230   HGBUR SMALL (A) 01/07/2018 1230   BILIRUBINUR NEGATIVE 01/07/2018 1230   KETONESUR NEGATIVE 01/07/2018 1230   PROTEINUR 100 (A) 01/07/2018 1230   NITRITE NEGATIVE 01/07/2018 1230   LEUKOCYTESUR NEGATIVE 01/07/2018 1230   Sepsis Labs Invalid input(s): PROCALCITONIN,  WBC,  LACTICIDVEN Microbiology Recent Results (from the past 240 hour(s))  Culture, blood (Routine x 2)     Status: Abnormal   Collection Time: 05/12/19  6:09 PM    Specimen: BLOOD  Result Value Ref Range Status   Specimen Description   Final    BLOOD LEFT FA Performed at [facility], [address], [place], [place] [address]    Special Requests   Final    BOTTLES DRAWN AEROBIC AND ANAEROBIC Blood Culture adequate volume Performed at [facility], [address] [place], [place] [address]    Culture  Setup Time   Final    GRAM POSITIVE COCCI IN BOTH AEROBIC AND ANAEROBIC BOTTLES CRITICAL VALUE NOTED.  VALUE IS CONSISTENT WITH PREVIOUSLY REPORTED AND CALLED VALUE. Performed at [facility], [address] [place], [place] [address]    Culture (A)  Final    STAPHYLOCOCCUS LUGDUNENSIS SUSCEPTIBILITIES PERFORMED ON PREVIOUS CULTURE WITHIN THE LAST 5 DAYS. Performed at [facility], [place] [address], [place], [place] [address]    Report Status 05/15/2019 FINAL  Final  Culture, blood (Routine x 2)     Status: Abnormal   Collection Time: 05/12/19  6:09 PM   Specimen: BLOOD  Result Value Ref Range Status   Specimen Description   Final    BLOOD L HAND Performed at [facility], [address] [place], [place] [address]    Special Requests   Final    BOTTLES DRAWN AEROBIC AND ANAEROBIC Blood Culture adequate volume Performed at [facility], [address], [place], [place] [address]    Culture  Setup Time   Final    GRAM POSITIVE COCCI IN BOTH AEROBIC AND ANAEROBIC BOTTLES CRITICAL RESULT CALLED TO, READ BACK BY AND VERIFIED WITH: CHRISTINE KATSOUDAS AT 1941 ON 05/13/19 SNG Performed at [facility], [place] [address] [place], [place] [address]    Culture STAPHYLOCOCCUS LUGDUNENSIS (A)  Final   Report Status 05/15/2019 FINAL  Final   Organism ID, Bacteria STAPHYLOCOCCUS LUGDUNENSIS  Final      Susceptibility   Staphylococcus lugdunensis - MIC*    CIPROFLOXACIN <=0.5 SENSITIVE Sensitive     ERYTHROMYCIN <=0.25 SENSITIVE Sensitive     GENTAMICIN <=0.5 SENSITIVE Sensitive      OXACILLIN 2 SENSITIVE Sensitive     TETRACYCLINE <=1 SENSITIVE Sensitive     VANCOMYCIN <=0.5 SENSITIVE Sensitive     TRIMETH/SULFA <=10 SENSITIVE Sensitive     CLINDAMYCIN <=0.25 SENSITIVE Sensitive     RIFAMPIN <=0.5 SENSITIVE Sensitive     Inducible Clindamycin NEGATIVE Sensitive     * STAPHYLOCOCCUS LUGDUNENSIS  Blood Culture ID Panel (Reflexed)     Status: Abnormal   Collection Time: 05/12/19  6:09 PM  Result Value Ref Range Status   Enterococcus species NOT DETECTED NOT DETECTED Final  Listeria monocytogenes NOT DETECTED NOT DETECTED Final   Staphylococcus species DETECTED (A) NOT DETECTED Final    Comment: Methicillin (oxacillin) susceptible coagulase negative staphylococcus. Possible blood culture contaminant (unless isolated from more than one blood culture draw or clinical case suggests pathogenicity). No antibiotic treatment is indicated for blood  culture contaminants. CRITICAL RESULT CALLED TO, READ BACK BY AND VERIFIED WITH: CHRISTINE KATSOUDAS AT 3976 ON 05/13/19 SNG    Staphylococcus aureus (BCID) NOT DETECTED NOT DETECTED Final   Methicillin resistance NOT DETECTED NOT DETECTED Final   Streptococcus species NOT DETECTED NOT DETECTED Final   Streptococcus agalactiae NOT DETECTED NOT DETECTED Final   Streptococcus pneumoniae NOT DETECTED NOT DETECTED Final   Streptococcus pyogenes NOT DETECTED NOT DETECTED Final   Acinetobacter baumannii NOT DETECTED NOT DETECTED Final   Enterobacteriaceae species NOT DETECTED NOT DETECTED Final   Enterobacter cloacae complex NOT DETECTED NOT DETECTED Final   Escherichia coli NOT DETECTED NOT DETECTED Final   Klebsiella oxytoca NOT DETECTED NOT DETECTED Final   Klebsiella pneumoniae NOT DETECTED NOT DETECTED Final   Proteus species NOT DETECTED NOT DETECTED Final   Serratia marcescens NOT DETECTED NOT DETECTED Final   Haemophilus influenzae NOT DETECTED NOT DETECTED Final   Neisseria meningitidis NOT DETECTED NOT DETECTED Final    Pseudomonas aeruginosa NOT DETECTED NOT DETECTED Final   Candida albicans NOT DETECTED NOT DETECTED Final   Candida glabrata NOT DETECTED NOT DETECTED Final   Candida krusei NOT DETECTED NOT DETECTED Final   Candida parapsilosis NOT DETECTED NOT DETECTED Final   Candida tropicalis NOT DETECTED NOT DETECTED Final    Comment: Performed at [facility], [address] [place], [place] [address]  Respiratory Panel by RT PCR (Flu A&B, Covid) - Nasopharyngeal Swab     Status: None   Collection Time: 05/12/19  6:10 PM   Specimen: Nasopharyngeal Swab  Result Value Ref Range Status   SARS Coronavirus 2 by RT PCR NEGATIVE NEGATIVE Final    Comment: (NOTE) SARS-CoV-2 target nucleic acids are NOT DETECTED. The SARS-CoV-2 RNA is generally detectable in upper respiratoy specimens during the acute phase of infection. The lowest concentration of SARS-CoV-2 viral copies this assay can detect is 131 copies/mL. A negative result does not preclude SARS-Cov-2 infection and should not be used as the sole basis for treatment or other patient management decisions. A negative result may occur with  improper specimen collection/handling, submission of specimen other than nasopharyngeal swab, presence of viral mutation(s) within the areas targeted by this assay, and inadequate number of viral copies (<131 copies/mL). A negative result must be combined with clinical observations, patient history, and epidemiological information. The expected result is Negative. Fact Sheet for Patients:  PinkCheek.be Fact Sheet for Healthcare Providers:  GravelBags.it This test is not yet ap proved or cleared by the [place] FDA and  has been authorized for detection and/or diagnosis of SARS-CoV-2 by FDA under an Emergency Use Authorization (EUA). This EUA will remain  in effect (meaning this test can be used) for the duration of the COVID-19 declaration  under Section 564(b)(1) of the Act, 21 U.S.C. section 360bbb-3(b)(1), unless the authorization is terminated or revoked sooner.    Influenza A by PCR NEGATIVE NEGATIVE Final   Influenza B by PCR NEGATIVE NEGATIVE Final    Comment: (NOTE) The Xpert Xpress SARS-CoV-2/FLU/RSV assay is intended as an aid in  the diagnosis of influenza from Nasopharyngeal swab specimens and  should not be used as a sole basis for treatment. Nasal  washings and  aspirates are unacceptable for Xpert Xpress SARS-CoV-2/FLU/RSV  testing. Fact Sheet for Patients: PinkCheek.be Fact Sheet for Healthcare Providers: GravelBags.it This test is not yet approved or cleared by the [place] FDA and  has been authorized for detection and/or diagnosis of SARS-CoV-2 by  FDA under an Emergency Use Authorization (EUA). This EUA will remain  in effect (meaning this test can be used) for the duration of the  Covid-19 declaration under Section 564(b)(1) of the Act, 21  U.S.C. section 360bbb-3(b)(1), unless the authorization is  terminated or revoked. Performed at [facility], [address] [place], [place] [address]   MRSA PCR Screening     Status: None   Collection Time: 05/13/19 12:04 AM   Specimen: Nasopharyngeal  Result Value Ref Range Status   MRSA by PCR NEGATIVE NEGATIVE Final    Comment:        The GeneXpert MRSA Assay (FDA approved for NASAL specimens only), is one component of a comprehensive MRSA colonization surveillance program. It is not intended to diagnose MRSA infection nor to guide or monitor treatment for MRSA infections. Performed at [facility], [address] [place], [place] [address]   CULTURE, BLOOD (ROUTINE X 2) w Reflex to ID Panel     Status: None (Preliminary result)   Collection Time: 05/14/19 10:36 AM   Specimen: BLOOD RIGHT ARM  Result Value Ref Range Status   Specimen Description BLOOD RIGHT ARM   Final   Special Requests   Final    BOTTLES DRAWN AEROBIC AND ANAEROBIC Blood Culture adequate volume   Culture   Final    NO GROWTH 3 DAYS Performed at [facility], [address], [place], [place] [address]    Report Status PENDING  Incomplete  CULTURE, BLOOD (ROUTINE X 2) w Reflex to ID Panel     Status: None (Preliminary result)   Collection Time: 05/14/19 10:43 AM   Specimen: BLOOD LEFT ARM  Result Value Ref Range Status   Specimen Description BLOOD LEFT ARM  Final   Special Requests   Final    BOTTLES DRAWN AEROBIC AND ANAEROBIC Blood Culture adequate volume   Culture   Final    NO GROWTH 3 DAYS Performed at [facility], [address], [place], [place] [address]    Report Status PENDING  Incomplete     Time coordinating discharge: Over 30 minutes  SIGNED:   Nolberto Hanlon, MD  [facility] 05/17/2019, 1:14 PM Pager   If 7PM-7AM, please contact night-coverage [email or web address] Password [email or web address]

## 2019-05-17 NOTE — Progress Notes (Signed)
HD Initiated    05/17/19 1204  Vital Signs  Temp 97.8 F (36.6 C)  Temp Source Oral  Pulse Rate 78  Pulse Rate Source Dinamap  Resp 15  BP (!) 190/83  BP Location Left Arm  BP Method Automatic  Patient Position (if appropriate) Lying  Oxygen Therapy  SpO2 100 %  O2 Device Room Air  Pain Assessment  Pain Scale 0-10  Pain Score 0  During Hemodialysis Assessment  Blood Flow Rate (mL/min) 400 mL/min  Arterial Pressure (mmHg) -120 mmHg  Venous Pressure (mmHg) 60 mmHg  Transmembrane Pressure (mmHg) 30 mmHg  Ultrafiltration Rate (mL/min) 860 mL/min  Dialysate Flow Rate (mL/min) 800 ml/min  Conductivity: Machine  14  HD Safety Checks Performed Yes  Intra-Hemodialysis Comments Tx initiated

## 2019-05-18 LAB — PARATHYROID HORMONE, INTACT (NO CA): PTH: 138 pg/mL — ABNORMAL HIGH (ref 15–65)

## 2019-05-18 LAB — HEPATITIS B SURFACE ANTIBODY, QUANTITATIVE: Hep B S AB Quant (Post): >1000 m[IU]/mL (ref >9.9)

## 2019-05-19 LAB — CULTURE, BLOOD (ROUTINE X 2)
Culture: NO GROWTH
Culture: NO GROWTH
Special Requests: ADEQUATE
Special Requests: ADEQUATE

## 2019-06-03 ENCOUNTER — Telehealth: Payer: Self-pay

## 2019-06-03 ENCOUNTER — Other Ambulatory Visit: Payer: Self-pay

## 2019-06-03 NOTE — Telephone Encounter (Signed)
Reminded of appt and patient declined stating he has dialysis and can not take time to come to appt or do phone appt.

## 2019-06-04 ENCOUNTER — Inpatient Hospital Stay: Payer: Medicare Other | Admitting: Infectious Diseases

## 2019-06-11 ENCOUNTER — Telehealth: Payer: Self-pay | Admitting: Urology

## 2019-06-11 NOTE — Telephone Encounter (Signed)
LM FOR PT'S BROTHER TO CALL BACK TO SCHEDULE AN APP WITH DR. BRANDON  SEE MESSAGE BELOW. ANYONE CAN SCHEDULE THE APP Daniel Thomas

## 2019-06-11 NOTE — Telephone Encounter (Signed)
-----   Message from Hollice Espy, MD sent at 06/10/2019  4:14 PM EDT ----- Regarding: RE: MRI He needs to come in for an appointment at this point to discuss why he hasn't gotten it done.  And to repeat a PSA.  Please schedule.    Hollice Espy, MD ----- Message ----- From: Benard Halsted Sent: 06/10/2019   2:04 PM EDT To: Hollice Espy, MD Subject: MRI                                            Patient's brother called today asking about his MRI that you ordered last year. The patient cancelled his MRI 4 times and the ordered expired. Do you still want him to have it done? If so he will need a new order. Not really sure if I get it approved again if he will go? Please advise   Thanks, Sharyn Lull

## 2019-06-12 ENCOUNTER — Other Ambulatory Visit: Payer: Self-pay

## 2019-06-12 DIAGNOSIS — C61 Malignant neoplasm of prostate: Secondary | ICD-10-CM

## 2019-06-13 ENCOUNTER — Inpatient Hospital Stay: Payer: Medicare Other | Admitting: Infectious Diseases

## 2019-06-13 ENCOUNTER — Other Ambulatory Visit: Payer: BC Managed Care – PPO

## 2019-06-18 ENCOUNTER — Ambulatory Visit: Payer: Medicare Other | Attending: Infectious Diseases | Admitting: Infectious Diseases

## 2019-06-18 ENCOUNTER — Other Ambulatory Visit: Payer: Self-pay

## 2019-06-18 ENCOUNTER — Inpatient Hospital Stay: Payer: Medicare Other | Admitting: Infectious Diseases

## 2019-06-18 ENCOUNTER — Encounter: Payer: Self-pay | Admitting: Infectious Diseases

## 2019-06-18 VITALS — BP 214/95 | HR 80 | Temp 97.9°F | Resp 16 | Ht 59.0 in | Wt 159.0 lb

## 2019-06-18 DIAGNOSIS — J449 Chronic obstructive pulmonary disease, unspecified: Secondary | ICD-10-CM | POA: Diagnosis not present

## 2019-06-18 DIAGNOSIS — E1122 Type 2 diabetes mellitus with diabetic chronic kidney disease: Secondary | ICD-10-CM | POA: Diagnosis not present

## 2019-06-18 DIAGNOSIS — Z7984 Long term (current) use of oral hypoglycemic drugs: Secondary | ICD-10-CM | POA: Insufficient documentation

## 2019-06-18 DIAGNOSIS — Z992 Dependence on renal dialysis: Secondary | ICD-10-CM | POA: Diagnosis not present

## 2019-06-18 DIAGNOSIS — B957 Other staphylococcus as the cause of diseases classified elsewhere: Secondary | ICD-10-CM | POA: Insufficient documentation

## 2019-06-18 DIAGNOSIS — Z79899 Other long term (current) drug therapy: Secondary | ICD-10-CM | POA: Diagnosis not present

## 2019-06-18 DIAGNOSIS — N186 End stage renal disease: Secondary | ICD-10-CM | POA: Diagnosis not present

## 2019-06-18 DIAGNOSIS — N400 Enlarged prostate without lower urinary tract symptoms: Secondary | ICD-10-CM | POA: Insufficient documentation

## 2019-06-18 DIAGNOSIS — R7881 Bacteremia: Secondary | ICD-10-CM | POA: Diagnosis not present

## 2019-06-18 DIAGNOSIS — L258 Unspecified contact dermatitis due to other agents: Secondary | ICD-10-CM | POA: Diagnosis not present

## 2019-06-18 DIAGNOSIS — Z87891 Personal history of nicotine dependence: Secondary | ICD-10-CM | POA: Diagnosis not present

## 2019-06-18 DIAGNOSIS — E7800 Pure hypercholesterolemia, unspecified: Secondary | ICD-10-CM | POA: Insufficient documentation

## 2019-06-18 DIAGNOSIS — I120 Hypertensive chronic kidney disease with stage 5 chronic kidney disease or end stage renal disease: Secondary | ICD-10-CM | POA: Insufficient documentation

## 2019-06-18 NOTE — Patient Instructions (Addendum)
Are here for follow of recent staph lugdunesis bacteremia- you completed IV antibiotics  Will do labs during your next dilaysis center BP today was 140/100- follow with PCP

## 2019-06-18 NOTE — Progress Notes (Signed)
NAME: Daniel Thomas  DOB: 12/08/1955  MRN: [id number]  Date/Time: 06/18/2019 12:25 PM  Subjective:   ?follow up visit  Pt was recently in [facility] 5/9-5/14 for fever and had staph lugdunensis bacteremia on 5/9 related to permacath which was removed. Also found to have skin reaction from dressing applied to the permacathRepeat blood culture from 5/11 was negative,TEE was neg , MRI cervical spine was neg Was discharged on cefazolin for 4 weeks which was given during dialysis and he completed on 06/08/19 He is here for follow up and doing fine. His brother is with him. No specific complaint Says he had some injections given to his recently Had dialysis today  Past Medical History:  Diagnosis Date  . COPD (chronic obstructive pulmonary disease) ([place])   . Diabetes mellitus without complication ([place])   . Enlarged prostate   . ESRD on dialysis ([place])   . Heart murmur   . High cholesterol   . Hypertension     Past Surgical History:  Procedure Laterality Date  . DIALYSIS/PERMA CATHETER INSERTION N/A 01/12/2018   Procedure: DIALYSIS/PERMA CATHETER INSERTION;  Surgeon: Katha Cabal, MD;  Location: [place] [facility];  Service: Cardiovascular;  Laterality: N/A;  . DIALYSIS/PERMA CATHETER INSERTION N/A 05/16/2019   Procedure: DIALYSIS/PERMA CATHETER INSERTION;  Surgeon: Algernon Huxley, MD;  Location: [place] [facility];  Service: Cardiovascular;  Laterality: N/A;  . DIALYSIS/PERMA CATHETER REMOVAL N/A 05/13/2019   Procedure: DIALYSIS/PERMA CATHETER REMOVAL;  Surgeon: Algernon Huxley, MD;  Location: [place] [facility];  Service: Cardiovascular;  Laterality: N/A;  . TEE WITHOUT CARDIOVERSION N/A 05/15/2019   Procedure: TRANSESOPHAGEAL ECHOCARDIOGRAM (TEE);  Surgeon: Teodoro Spray, MD;  Location: [facility];  Service: Cardiovascular;  Laterality: N/A;    Social History   Socioeconomic History  . Marital status: Single    Spouse name: Not on file  . Number of children: Not on file  . Years of  education: Not on file  . Highest education level: Not on file  Occupational History  . Not on file  Tobacco Use  . Smoking status: Former Smoker    Years: 20.00  . Smokeless tobacco: Never Used  Vaping Use  . Vaping Use: Never used  Substance and Sexual Activity  . Alcohol use: Never  . Drug use: Never  . Sexual activity: Not Currently  Other Topics Concern  . Not on file  Social History Narrative   Lives home alone, 3 aunts that live next door and brother 1 mile away. Helps commute to appointments.   Social Determinants of Health   Financial Resource Strain:   . Difficulty of Paying Living Expenses:   Food Insecurity:   . Worried About Charity fundraiser in the Last Year:   . Arboriculturist in the Last Year:   Transportation Needs:   . Film/video editor (Medical):   Marland Kitchen Lack of Transportation (Non-Medical):   Physical Activity:   . Days of Exercise per Week:   . Minutes of Exercise per Session:   Stress:   . Feeling of Stress :   Social Connections:   . Frequency of Communication with Friends and Family:   . Frequency of Social Gatherings with Friends and Family:   . Attends Religious Services:   . Active Member of Clubs or Organizations:   . Attends Archivist Meetings:   Marland Kitchen Marital Status:   Intimate Partner Violence:   . Fear of Current or Ex-Partner:   .  Emotionally Abused:   Marland Kitchen Physically Abused:   . Sexually Abused:     No family history on file. No Known Allergies  ? Current Outpatient Medications  Medication Sig Dispense Refill  . amLODipine (NORVASC) 10 MG tablet Take 1 tablet (10 mg total) by mouth daily. 30 tablet 0  . atorvastatin (LIPITOR) 40 MG tablet     . calcium carbonate (TUMS - DOSED IN MG ELEMENTAL CALCIUM) 500 MG chewable tablet Chew 2.5 tablets (500 mg of elemental calcium total) by mouth every 6 (six) hours as needed for indigestion. 30 tablet 0  . carvedilol (COREG) 12.5 MG tablet Take 12.5 mg by mouth 2 (two) times daily  with a meal.    . ceFAZolin (ANCEF) 1-4 GM/50ML-% SOLN Inject 50 mLs (1 g total) into the vein daily. 1500 mL   . citalopram (CELEXA) 20 MG tablet Take 20 mg by mouth daily.    . ergocalciferol (DRISDOL) 1.25 MG (50000 UT) capsule Take by mouth. Administered at analysis    . feeding supplement, GLUCERNA SHAKE, ([place]) [facility]    . ferric citrate (AURYXIA) 1 GM 210 MG(Fe) tablet Take by mouth.    . hydrALAZINE (APRESOLINE) 25 MG tablet Take 1 tablet (25 mg total) by mouth every 8 (eight) hours. 90 tablet 0  . linagliptin (TRADJENTA) 5 MG TABS tablet Take 1 tablet (5 mg total) by mouth daily. 30 tablet 0  . lovastatin (MEVACOR) 40 MG tablet Take 1 tablet (40 mg total) by mouth at bedtime.    . Methoxy PEG-Epoetin Beta (MIRCERA IJ) Mircera    . traZODone (DESYREL) 50 MG tablet Take 25 mg by mouth at bedtime as needed for sleep.     No current facility-administered medications for this visit.     Abtx:  Anti-infectives (From admission, onward)   None      REVIEW OF SYSTEMS:  Const: negative fever, negative chills, negative weight loss Eyes: negative diplopia or visual changes, negative eye pain ENT: negative coryza, negative sore throat Resp: negative cough, hemoptysis, dyspnea Cards: negative for chest pain, palpitations, lower extremity edema GU: negative for frequency, dysuria and hematuria GI: Negative for abdominal pain, diarrhea, bleeding, constipation Skin: pruritus over chestwall Heme: negative for easy bruising and gum/nose bleeding MS: generalized  weakness Neurolo:negative for headaches, dizziness, vertigo, memory problems  Psych: negative for feelings of anxiety, depression  Endocrine: diabetes Allergy/Immunology- negative for any medication or food allergies ? Objective:  VITALS:  BP (!) 214/95   Pulse 80   Temp 97.9 F (36.6 C)   Resp 16   Ht 4\' 11"  (1.499 m)   Wt 159 lb (72.1 kg)   SpO2 98%   BMI 32.11 kg/m   Repeat BP both arms  140/100  PHYSICAL EXAM:  General: Alert, cooperative, no distress, appears stated age.  Head: Normocephalic, without obvious abnormality, atraumatic. Eyes: injected conjuctiva Lungs: Clear to auscultation bilaterally. No Wheezing or Rhonchi. No rales. Heart:s1s2 Chest wall- left side perm cath    While admitted   Abdomen: Soft, non-tender,not distended. Bowel sounds normal. No masses Neurologic: Grossly non-focal   ? Impression/Recommendation ? ?Staph lugdunensis bacteremia:. Sourcewas the permacath which has been removed. Repeat blood culture from 05/14/19 neg  MRI cervical spine  No evidence for osteomyelitis discitis or septic arthritis within the cervical spine ?TEE was  negative for endocarditis. He completed 4 weeks of  IV cefazolin until 06/08/19 which was administered  during dialysis Asked dialysis center to get a blood culture and  cbc/CMP on 06/20/19   Contact dermatitis at the site of the previous permacath. This wasvery likely due to the dressing he had before.much improved  Diabetes mellitus on meds  Discussed the management with patient and his brother  ? ___________________________________________________ Discussed with patient, requesting provider Note:  This document was prepared using Dragon voice recognition software and may include unintentional dictation errors.

## 2019-06-18 NOTE — Progress Notes (Signed)
NAME: Daniel Thomas  DOB: 06/15/1955  MRN: [id number]  Date/Time: 06/18/2019 12:25 PM ? Past Medical History:  Diagnosis Date  . COPD (chronic obstructive pulmonary disease) ([place])   . Diabetes mellitus without complication ([place])   . Enlarged prostate   . ESRD on dialysis ([place])   . Heart murmur   . High cholesterol   . Hypertension     Past Surgical History:  Procedure Laterality Date  . DIALYSIS/PERMA CATHETER INSERTION N/A 01/12/2018   Procedure: DIALYSIS/PERMA CATHETER INSERTION;  Surgeon: Katha Cabal, MD;  Location: [facility];  Service: Cardiovascular;  Laterality: N/A;  . DIALYSIS/PERMA CATHETER INSERTION N/A 05/16/2019   Procedure: DIALYSIS/PERMA CATHETER INSERTION;  Surgeon: Algernon Huxley, MD;  Location: [facility];  Service: Cardiovascular;  Laterality: N/A;  . DIALYSIS/PERMA CATHETER REMOVAL N/A 05/13/2019   Procedure: DIALYSIS/PERMA CATHETER REMOVAL;  Surgeon: Algernon Huxley, MD;  Location: [facility];  Service: Cardiovascular;  Laterality: N/A;  . TEE WITHOUT CARDIOVERSION N/A 05/15/2019   Procedure: TRANSESOPHAGEAL ECHOCARDIOGRAM (TEE);  Surgeon: Teodoro Spray, MD;  Location: [facility];  Service: Cardiovascular;  Laterality: N/A;    Social History   Socioeconomic History  . Marital status: Single    Spouse name: Not on file  . Number of children: Not on file  . Years of education: Not on file  . Highest education level: Not on file  Occupational History  . Not on file  Tobacco Use  . Smoking status: Former Smoker    Years: 20.00  . Smokeless tobacco: Never Used  Vaping Use  . Vaping Use: Never used  Substance and Sexual Activity  . Alcohol use: Never  . Drug use: Never  . Sexual activity: Not Currently  Other Topics Concern  . Not on file  Social History Narrative   Lives home alone, 3 aunts that live next door and brother 1 mile away. Helps commute to appointments.   Social Determinants of Health   Financial Resource  Strain:   . Difficulty of Paying Living Expenses:   Food Insecurity:   . Worried About Charity fundraiser in the Last Year:   . Arboriculturist in the Last Year:   Transportation Needs:   . Film/video editor (Medical):   Marland Kitchen Lack of Transportation (Non-Medical):   Physical Activity:   . Days of Exercise per Week:   . Minutes of Exercise per Session:   Stress:   . Feeling of Stress :   Social Connections:   . Frequency of Communication with Friends and Family:   . Frequency of Social Gatherings with Friends and Family:   . Attends Religious Services:   . Active Member of Clubs or Organizations:   . Attends Archivist Meetings:   Marland Kitchen Marital Status:   Intimate Partner Violence:   . Fear of Current or Ex-Partner:   . Emotionally Abused:   Marland Kitchen Physically Abused:   . Sexually Abused:     No family history on file. No Known Allergies  ? Current Outpatient Medications  Medication Sig Dispense Refill  . amLODipine (NORVASC) 10 MG tablet Take 1 tablet (10 mg total) by mouth daily. 30 tablet 0  . atorvastatin (LIPITOR) 40 MG tablet     . calcium carbonate (TUMS - DOSED IN MG ELEMENTAL CALCIUM) 500 MG chewable tablet Chew 2.5 tablets (500 mg of elemental calcium total) by mouth every 6 (six) hours as needed for indigestion. 30 tablet 0  .  carvedilol (COREG) 12.5 MG tablet Take 12.5 mg by mouth 2 (two) times daily with a meal.    . ceFAZolin (ANCEF) 1-4 GM/50ML-% SOLN Inject 50 mLs (1 g total) into the vein daily. 1500 mL   . citalopram (CELEXA) 20 MG tablet Take 20 mg by mouth daily.    . ergocalciferol (DRISDOL) 1.25 MG (50000 UT) capsule Take by mouth. Administered at analysis    . feeding supplement, GLUCERNA SHAKE, ([place]) [facility]    . ferric citrate (AURYXIA) 1 GM 210 MG(Fe) tablet Take by mouth.    . hydrALAZINE (APRESOLINE) 25 MG tablet Take 1 tablet (25 mg total) by mouth every 8 (eight) hours. 90 tablet 0  . linagliptin (TRADJENTA) 5 MG TABS tablet  Take 1 tablet (5 mg total) by mouth daily. 30 tablet 0  . lovastatin (MEVACOR) 40 MG tablet Take 1 tablet (40 mg total) by mouth at bedtime.    . Methoxy PEG-Epoetin Beta (MIRCERA IJ) Mircera    . traZODone (DESYREL) 50 MG tablet Take 25 mg by mouth at bedtime as needed for sleep.     No current facility-administered medications for this visit.     Abtx:  Anti-infectives (From admission, onward)   None      REVIEW OF SYSTEMS:  Const: negative fever, negative chills, negative weight loss Eyes: negative diplopia or visual changes, negative eye pain ENT: negative coryza, negative sore throat Resp: negative cough, hemoptysis, dyspnea Cards: negative for chest pain, palpitations, lower extremity edema GU: negative for frequency, dysuria and hematuria GI: Negative for abdominal pain, diarrhea, bleeding, constipation Skin: negative for rash and pruritus Heme: negative for easy bruising and gum/nose bleeding MS: negative for myalgias, arthralgias, back pain and muscle weakness Neurolo:negative for headaches, dizziness, vertigo, memory problems  Psych: negative for feelings of anxiety, depression  Endocrine: negative for thyroid, diabetes Allergy/Immunology- negative for any medication or food allergies ? Pertinent Positives include : Objective:  VITALS:  BP (!) 214/95   Pulse 80   Temp 97.9 F (36.6 C)   Resp 16   Ht 4\' 11"  (1.499 m)   Wt 159 lb (72.1 kg)   SpO2 98%   BMI 32.11 kg/m  PHYSICAL EXAM:  General: Alert, cooperative, no distress, appears stated age.  Head: Normocephalic, without obvious abnormality, atraumatic. Eyes: Conjunctivae clear, anicteric sclerae. Pupils are equal ENT Nares normal. No drainage or sinus tenderness. Lips, mucosa, and tongue normal. No Thrush Neck: Supple, symmetrical, no adenopathy, thyroid: non tender no carotid bruit and no JVD. Back: No CVA tenderness. Lungs: Clear to auscultation bilaterally. No Wheezing or Rhonchi. No rales. Heart:  Regular rate and rhythm, no murmur, rub or gallop. Abdomen: Soft, non-tender,not distended. Bowel sounds normal. No masses Extremities: atraumatic, no cyanosis. No edema. No clubbing Skin:        Lymph: Cervical, supraclavicular normal. Neurologic: Grossly non-focal   Impression/Recommendation ? ?Staph lugdunensis bacteremia:due to permacath which was removed Has been treated with 4 weeks of IV cefazolin Repeat blood culture from 05/14/19 neg  MRI cervical spine showed No evidence for osteomyelitis discitis or septic arthritis within the cervical spine ?TEE was negative for endocarditis.  Asked dialysis nurse to send blood culture, CBC. CMP on 6/17  Contact dermatitis at the site of the previous  permacath dressing . much improved Diabetes mellitus  Discussed the management with him and his brother ? ___________________________________________________ Discussed with patient, requesting provider Note:  This document was prepared using Dragon voice recognition software and may include unintentional  dictation errors.

## 2019-06-24 ENCOUNTER — Other Ambulatory Visit: Payer: Medicare Other

## 2019-06-24 ENCOUNTER — Other Ambulatory Visit: Payer: Self-pay

## 2019-06-24 DIAGNOSIS — C61 Malignant neoplasm of prostate: Secondary | ICD-10-CM

## 2019-06-25 LAB — PSA: Prostate Specific Ag, Serum: 16.7 ng/mL — ABNORMAL HIGH (ref 0.0–4.0)

## 2019-06-25 NOTE — Progress Notes (Signed)
06/26/19 2:28 PM   Daniel Thomas [birth year] [id number]  Referring provider: Marguerita Merles, [place] [address] [place],  [place] [address] Chief Complaint  Patient presents with  . Prostate Cancer    HPI: Daniel Thomas is a 64 y.o. M with multiple medical problems including end-stage renal disease on hemodialysis who presents today for surveillance of low risk prostate cancer. He is accompanied w/ his son.   He was first diagnosed with this in 02/2018 at which time his PSA had jumped up to 20 from 10 the previous year.  Is also noted to have multiple nodules on rectal exam.  Prostate biopsy revealed Gleason 3+3 in 6 of 12 cores, primarily on the left up to 53% of the tissue.  Most recent PSA elevated to 16.7 as of 06/24/19.   Last visit was in 08/2018 for which he was due for an prostate MRI however he called and cancelled MRI 4 times. He states of issues w/ transportation to obtain MRI.   He was admitted to the hospital recently for Staph bacteremia (line infection)  PSA trend: Component     Latest Ref Rng & Units 09/22/2017 08/27/2018 06/24/2019  Prostate Specific Ag, Serum     0.0 - 4.0 ng/mL 10.0 (H) 13.4 (H) 16.7 (H)    PMH: Past Medical History:  Diagnosis Date  . COPD (chronic obstructive pulmonary disease) ([place])   . Diabetes mellitus without complication ([place])   . Enlarged prostate   . ESRD on dialysis ([place])   . Heart murmur   . High cholesterol   . Hypertension     Surgical History: Past Surgical History:  Procedure Laterality Date  . DIALYSIS/PERMA CATHETER INSERTION N/A 01/12/2018   Procedure: DIALYSIS/PERMA CATHETER INSERTION;  Surgeon: Katha Cabal, MD;  Location: [facility];  Service: Cardiovascular;  Laterality: N/A;  . DIALYSIS/PERMA CATHETER INSERTION N/A 05/16/2019   Procedure: DIALYSIS/PERMA CATHETER INSERTION;  Surgeon: Algernon Huxley, MD;  Location: [facility];  Service: Cardiovascular;  Laterality: N/A;  . DIALYSIS/PERMA  CATHETER REMOVAL N/A 05/13/2019   Procedure: DIALYSIS/PERMA CATHETER REMOVAL;  Surgeon: Algernon Huxley, MD;  Location: [facility];  Service: Cardiovascular;  Laterality: N/A;  . TEE WITHOUT CARDIOVERSION N/A 05/15/2019   Procedure: TRANSESOPHAGEAL ECHOCARDIOGRAM (TEE);  Surgeon: Teodoro Spray, MD;  Location: [facility];  Service: Cardiovascular;  Laterality: N/A;    Home Medications:  Allergies as of 06/26/2019   No Known Allergies     Medication List       Accurate as of June 26, 2019  2:28 PM. If you have any questions, ask your nurse or doctor.        amLODipine 10 MG tablet Commonly known as: NORVASC Take 1 tablet (10 mg total) by mouth daily.   atorvastatin 40 MG tablet Commonly known as: LIPITOR   Auryxia 1 GM 210 MG(Fe) tablet Generic drug: ferric citrate Take by mouth.   calcium carbonate 500 MG chewable tablet Commonly known as: TUMS - dosed in mg elemental calcium Chew 2.5 tablets (500 mg of elemental calcium total) by mouth every 6 (six) hours as needed for indigestion.   carvedilol 12.5 MG tablet Commonly known as: COREG Take 12.5 mg by mouth 2 (two) times daily with a meal.   ceFAZolin 1-4 GM/50ML-% Soln Commonly known as: ANCEF Inject 50 mLs (1 g total) into the vein daily.   citalopram 20 MG tablet Commonly known as: CELEXA Take 20 mg by mouth  daily.   Drisdol 1.25 MG (50000 UT) capsule Generic drug: ergocalciferol Take by mouth. Administered at analysis   feeding supplement (GLUCERNA SHAKE) [facility]   hydrALAZINE 25 MG tablet Commonly known as: APRESOLINE Take 1 tablet (25 mg total) by mouth every 8 (eight) hours.   linagliptin 5 MG Tabs tablet Commonly known as: TRADJENTA Take 1 tablet (5 mg total) by mouth daily.   lovastatin 40 MG tablet Commonly known as: MEVACOR Take 1 tablet (40 mg total) by mouth at bedtime.   MIRCERA IJ Mircera   traZODone 50 MG tablet Commonly known as: DESYREL Take 25 mg by mouth at bedtime  as needed for sleep.       Allergies: No Known Allergies  Family History: No family history on file.  Social History:  reports that he has quit smoking. He quit after 20.00 years of use. He has never used smokeless tobacco. He reports that he does not drink alcohol and does not use drugs.   Physical Exam: BP (!) 198/91   Pulse 89   Ht 5\' 1"  (1.549 m)   Wt 160 lb (72.6 kg)   BMI 30.23 kg/m   Constitutional:  Alert and oriented, No acute distress.  Accompanied by son today. HEENT: [place] AT, moist mucus membranes.  Trachea midline, no masses. Cardiovascular: No clubbing, cyanosis, or edema. Respiratory: Normal respiratory effort, no increased work of breathing. Skin: No rashes, bruises or suspicious lesions. Neurologic: Grossly intact, no focal deficits, moving all 4 extremities. Psychiatric: Normal mood and affect.  Laboratory Data:  Lab Results  Component Value Date   CREATININE 9.72 (H) 05/17/2019   Assessment & Plan:    1. Prostate cancer  History of Gleason 3+3 prostate cancer, technically intermediate risk based on abnormal rectal exam and PSA  Given his multiple medical comorbidities, would prefer surveillance however in the setting of rising PSA, agree that additional imaging should be considered versus repeat biopsy  I recommended a prostate MRI to ensure that there is no significant aggressive or extracapsular disease which may change our approach  Unable to give contrast due to end-stage renal disease  We will obtain MRI without contrast and have the patient return to discuss -previous barrier was getting to [place].  That was because he had [employer] and was out of network.  He now has Medicare and thus can have his MRI here.  He was scheduled today before leaving the office.  He is agreeable this plan    [facility] [address], [place], [place] [address] [phone]  I, Lucas Mallow, am acting  as a scribe for Dr. Hollice Espy,  I have reviewed the above documentation for accuracy and completeness, and I agree with the above.   Hollice Espy, MD  I spent 30 total minutes on the day of the encounter including pre-visit review of the medical record, face-to-face time with the patient, and post visit ordering of labs/imaging/tests.

## 2019-06-26 ENCOUNTER — Other Ambulatory Visit: Payer: Self-pay

## 2019-06-26 ENCOUNTER — Ambulatory Visit (INDEPENDENT_AMBULATORY_CARE_PROVIDER_SITE_OTHER): Payer: Medicare Other | Admitting: Urology

## 2019-06-26 ENCOUNTER — Encounter (INDEPENDENT_AMBULATORY_CARE_PROVIDER_SITE_OTHER): Payer: Self-pay

## 2019-06-26 ENCOUNTER — Other Ambulatory Visit: Payer: Self-pay | Admitting: *Deleted

## 2019-06-26 VITALS — BP 198/91 | HR 89 | Ht 61.0 in | Wt 160.0 lb

## 2019-06-26 DIAGNOSIS — C61 Malignant neoplasm of prostate: Secondary | ICD-10-CM

## 2019-06-29 ENCOUNTER — Other Ambulatory Visit: Payer: Self-pay

## 2019-06-29 ENCOUNTER — Emergency Department
Admission: EM | Admit: 2019-06-29 | Discharge: 2019-06-29 | Disposition: A | Discharge status: Home / Self Care | Payer: Medicare Other | Attending: Emergency Medicine | Admitting: Emergency Medicine

## 2019-06-29 ENCOUNTER — Emergency Department: Payer: Medicare Other

## 2019-06-29 DIAGNOSIS — E119 Type 2 diabetes mellitus without complications: Secondary | ICD-10-CM | POA: Insufficient documentation

## 2019-06-29 DIAGNOSIS — Z87891 Personal history of nicotine dependence: Secondary | ICD-10-CM | POA: Insufficient documentation

## 2019-06-29 DIAGNOSIS — R0602 Shortness of breath: Secondary | ICD-10-CM

## 2019-06-29 DIAGNOSIS — I10 Essential (primary) hypertension: Secondary | ICD-10-CM | POA: Diagnosis not present

## 2019-06-29 DIAGNOSIS — Z79899 Other long term (current) drug therapy: Secondary | ICD-10-CM | POA: Insufficient documentation

## 2019-06-29 DIAGNOSIS — J441 Chronic obstructive pulmonary disease with (acute) exacerbation: Secondary | ICD-10-CM | POA: Insufficient documentation

## 2019-06-29 LAB — COMPREHENSIVE METABOLIC PANEL
ALT: 27 U/L (ref 0–44)
AST: 35 U/L (ref 15–41)
Albumin: 4.2 g/dL (ref 3.5–5.0)
Alkaline Phosphatase: 106 U/L (ref 38–126)
Anion gap: 12 (ref 5–15)
BUN: 34 mg/dL — ABNORMAL HIGH (ref 8–23)
CO2: 26 mmol/L (ref 22–32)
Calcium: 9.4 mg/dL (ref 8.9–10.3)
Chloride: 99 mmol/L (ref 98–111)
Creatinine, Ser: 6.35 mg/dL — ABNORMAL HIGH (ref 0.61–1.24)
GFR calc Af Amer: 10 mL/min — ABNORMAL LOW (ref >60)
GFR calc non Af Amer: 9 mL/min — ABNORMAL LOW (ref >60)
Glucose, Bld: 120 mg/dL — ABNORMAL HIGH (ref 70–99)
Potassium: 3.4 mmol/L — ABNORMAL LOW (ref 3.5–5.1)
Sodium: 137 mmol/L (ref 135–145)
Total Bilirubin: 0.8 mg/dL (ref 0.3–1.2)
Total Protein: 8.7 g/dL — ABNORMAL HIGH (ref 6.5–8.1)

## 2019-06-29 LAB — TROPONIN I (HIGH SENSITIVITY)
Troponin I (High Sensitivity): 37 ng/L — ABNORMAL HIGH (ref <18)
Troponin I (High Sensitivity): 32 ng/L — ABNORMAL HIGH (ref <18)

## 2019-06-29 LAB — CBC
HCT: 34.3 % — ABNORMAL LOW (ref 39.0–52.0)
Hemoglobin: 11.3 g/dL — ABNORMAL LOW (ref 13.0–17.0)
MCH: 29.7 pg (ref 26.0–34.0)
MCHC: 32.9 g/dL (ref 30.0–36.0)
MCV: 90 fL (ref 80.0–100.0)
Platelets: 385 10*3/uL (ref 150–400)
RBC: 3.81 MIL/uL — ABNORMAL LOW (ref 4.22–5.81)
RDW: 15.4 % (ref 11.5–15.5)
WBC: 15 10*3/uL — ABNORMAL HIGH (ref 4.0–10.5)
nRBC: 0 % (ref 0.0–0.2)

## 2019-06-29 MED ORDER — NITROGLYCERIN 0.4 MG SL SUBL
SUBLINGUAL_TABLET | SUBLINGUAL | Status: AC
  Filled 2019-06-29: qty 3

## 2019-06-29 MED ORDER — METHYLPREDNISOLONE SODIUM SUCC 125 MG IJ SOLR
125.0000 mg | Freq: Once | INTRAMUSCULAR | Status: AC
  Administered 2019-06-29: 125 mg via INTRAVENOUS
  Filled 2019-06-29: qty 2

## 2019-06-29 MED ORDER — ALBUTEROL SULFATE HFA 108 (90 BASE) MCG/ACT IN AERS
2.0000 | INHALATION_SPRAY | Freq: Four times a day (QID) | RESPIRATORY_TRACT | 1 refills | Status: DC | PRN
Start: 2019-06-29 — End: 2019-11-10

## 2019-06-29 MED ORDER — PREDNISONE 20 MG PO TABS: 60.0000 mg | ORAL_TABLET | Freq: Every day | ORAL | 0 refills | Status: AC

## 2019-06-29 MED ORDER — NITROGLYCERIN 0.4 MG SL SUBL
0.4000 mg | SUBLINGUAL_TABLET | SUBLINGUAL | Status: DC | PRN
  Administered 2019-06-29: 0.4 mg via SUBLINGUAL

## 2019-06-29 MED ORDER — HYDRALAZINE HCL 50 MG PO TABS
25.0000 mg | ORAL_TABLET | Freq: Once | ORAL | Status: AC
  Administered 2019-06-29: 25 mg via ORAL
  Filled 2019-06-29: qty 1

## 2019-06-29 NOTE — ED Notes (Signed)
Pt verbalized understanding of discharge instructions. Pt discharged in cab for transport to [facility] for dialysis. Pt in NAD at this time.

## 2019-06-29 NOTE — Discharge Instructions (Addendum)
Please proceed directly to Dialysis from the emergency department, as arranged.

## 2019-06-29 NOTE — ED Notes (Signed)
Called dialysis to let them know hes in the ED

## 2019-06-29 NOTE — ED Notes (Signed)
Patient ambulated with pulse oximetry monitor per MD request. Patient ambulated with a steady gait, no distress noted. Patient's respirations even and unlabored. Patient's oxygen saturation level prior to ambulation ranged 91-92% on RA; During ambulation the patient's oxygen saturation level was 90-91% on RA.

## 2019-06-29 NOTE — ED Provider Notes (Signed)
Procedures     ----------------------------------------- 9:36 AM on 06/29/2019 -----------------------------------------  Dialysis is able to take the patient this morning for his routine session.  He will be discharged from the ED to proceed directly to his dialysis center.    Carrie Mew, MD 06/29/19 [phone]

## 2019-06-29 NOTE — ED Triage Notes (Signed)
Patient c/o SOB. Patient dialysis patient, Tues/Thurs/Sat. Patient with wheezing right lung at EMS arrival. Patient given duonebs during transport, reports increased comfort of breathing. Patient hypertensive for EMS, BP 220/110

## 2019-06-29 NOTE — ED Provider Notes (Signed)
[facility] Emergency Department Provider Note  ____________________________________________  Time seen: Approximately 3:45 AM  I have reviewed the triage vital signs and the nursing notes.   HISTORY  Chief Complaint Shortness of Breath   HPI Daniel Thomas is a 64 y.o. male with h/o ESRD on HD (TTS), COPD, diabetes, hypertension hyperlipidemia who presents for evaluation of shortness of breath.  Patient reports being in his usual state of health when he went to bed last night.  Woke up this morning with wheezing and shortness of breath.   Reports having chest tightness as well.  When EMS arrived patient was wheezing, sats were 90% on RA, and he was given 2 DuoNeb's.  Upon arrival to the ED patient reports that his shortness of breath has resolved and still has the chest tightness.  He feels back to baseline.  He denies history of PE or DVT, recent travel immobilization, leg pain or swelling, hemoptysis, or exogenous hormones.  He has not missed dialysis.  His next treatment is this morning.  No fever or chills, no cough or congestion, no loss of taste or smell.  Patient has been fully vaccinated for Covid.  Past Medical History:  Diagnosis Date  . COPD (chronic obstructive pulmonary disease) ([place])   . Diabetes mellitus without complication ([place])   . Enlarged prostate   . ESRD on dialysis ([place])   . Heart murmur   . High cholesterol   . Hypertension     Patient Active Problem List   Diagnosis Date Noted  . Bacterial infection, unspecified 05/17/2019  . Bacterial intestinal infection, unspecified 05/17/2019  . Septicemia due to coagulase-negative staphylococcal infection ([place]) 05/14/2019  . Sepsis ([place]) 05/12/2019  . ESRD (end stage renal disease) ([place]) 05/12/2019  . Leucocytosis 05/12/2019  . Dialysis AV fistula malfunction ([place]) 04/17/2018  . Anemia, unspecified 01/26/2018  . Benign prostatic hyperplasia without lower urinary tract symptoms 01/16/2018    . Chronic obstructive pulmonary disease, unspecified ([place]) 01/16/2018  . Hyperkalemia 01/12/2018  . 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 DIALYSIS/PERMA CATHETER INSERTION N/A 01/12/2018   Procedure: DIALYSIS/PERMA CATHETER INSERTION;  Surgeon: Katha Cabal, MD;  Location: [place] [facility];  Service: Cardiovascular;  Laterality: N/A;  . DIALYSIS/PERMA CATHETER INSERTION N/A 05/16/2019   Procedure: DIALYSIS/PERMA CATHETER INSERTION;  Surgeon: Algernon Huxley, MD;  Location: [place] [facility];  Service: Cardiovascular;  Laterality: N/A;  . DIALYSIS/PERMA CATHETER REMOVAL N/A 05/13/2019   Procedure: DIALYSIS/PERMA CATHETER REMOVAL;  Surgeon: Algernon Huxley, MD;  Location: [place] [facility];  Service: Cardiovascular;  Laterality: N/A;  . TEE WITHOUT CARDIOVERSION N/A 05/15/2019   Procedure: TRANSESOPHAGEAL ECHOCARDIOGRAM (TEE);  Surgeon: Teodoro Spray, MD;  Location: [facility];  Service: Cardiovascular;  Laterality: N/A;    Prior to Admission medications   Medication Sig Start Date End Date Taking? Authorizing Provider  albuterol (VENTOLIN HFA) 108 (90 Base) MCG/ACT inhaler Inhale 2 puffs into the lungs every 6 (six) hours as needed for wheezing or shortness of breath. 06/29/19   Alfred Levins, [place], MD  amLODipine (NORVASC) 10 MG tablet Take 1 tablet (10 mg total) by mouth daily. 01/08/18   Loletha Grayer, MD  atorvastatin (LIPITOR) 40 MG tablet  04/29/19   [provider]  calcium carbonate (TUMS - DOSED IN MG ELEMENTAL CALCIUM) 500 MG chewable tablet Chew 2.5 tablets (500 mg of elemental calcium total) by mouth every 6 (six) hours as needed for indigestion.  05/17/19   Nolberto Hanlon, MD  carvedilol (COREG) 12.5 MG tablet Take 12.5 mg by mouth 2 (two) times daily with a meal. 03/31/18   [provider]  ceFAZolin (ANCEF) 1-4 GM/50ML-% SOLN Inject 50 mLs (1 g total) into the vein daily. 05/17/19   Nolberto Hanlon, MD   citalopram (CELEXA) 20 MG tablet Take 20 mg by mouth daily.    [provider]  ergocalciferol (DRISDOL) 1.25 MG (50000 UT) capsule Take by mouth. Administered at analysis 02/02/18   [provider]  feeding supplement, [place], ([place]) [place] [facility] 04/29/19   [provider]  ferric citrate (AURYXIA) 1 GM 210 MG(Fe) tablet Take by mouth. 04/29/19   [provider]  hydrALAZINE (APRESOLINE) 25 MG tablet Take 1 tablet (25 mg total) by mouth every 8 (eight) hours. 05/17/19   Nolberto Hanlon, MD  linagliptin (TRADJENTA) 5 MG TABS tablet Take 1 tablet (5 mg total) by mouth daily. 01/09/18   Loletha Grayer, MD  lovastatin (MEVACOR) 40 MG tablet Take 1 tablet (40 mg total) by mouth at bedtime. 05/17/19   Nolberto Hanlon, MD  Methoxy PEG-Epoetin Beta (MIRCERA IJ) Mircera 01/08/19 03/03/20  [provider]  predniSONE (DELTASONE) 20 MG tablet Take 3 tablets (60 mg total) by mouth daily for 4 days. 06/29/19 07/03/19  Rudene Re, MD  traZODone (DESYREL) 50 MG tablet Take 25 mg by mouth at bedtime as needed for sleep. 03/18/18   [provider]    Allergies Patient has no known allergies.  No family history on file.  Social History Social History   Tobacco Use  . Smoking status: Former Smoker    Years: 20.00  . Smokeless tobacco: Never Used  Vaping Use  . Vaping Use: Never used  Substance Use Topics  . Alcohol use: Never  . Drug use: Never    Review of Systems  Constitutional: Negative for fever. Eyes: Negative for visual changes. ENT: Negative for sore throat. Neck: No neck pain  Cardiovascular: Negative for chest pain. + chest tightness Respiratory: + shortness of breath, wheezing Gastrointestinal: Negative for abdominal pain, vomiting or diarrhea. Genitourinary: Negative for dysuria. Musculoskeletal: Negative for back pain. Skin: Negative for rash. Neurological: Negative for headaches, weakness or numbness. Psych:  No SI or HI  ____________________________________________   PHYSICAL EXAM:  VITAL SIGNS: ED Triage Vitals  Enc Vitals Group     BP 06/29/19 0345 (!) 227/107     Pulse Rate 06/29/19 0341 (!) 106     Resp 06/29/19 0341 (!) 22     Temp 06/29/19 0341 98.2 F (36.8 C)     Temp src --      SpO2 06/29/19 0341 99 %     Weight 06/29/19 0341 160 lb 0.9 oz (72.6 kg)     Height 06/29/19 0341 5\' 1"  (1.549 m)     Head Circumference --      Peak Flow --      Pain Score 06/29/19 0341 0     Pain Loc --      Pain Edu? --      Excl. in [place]? --     Constitutional: Alert and oriented. Well appearing and in no apparent distress. HEENT:      Head: Normocephalic and atraumatic.         Eyes: Conjunctivae are normal. Sclera is non-icteric.       Mouth/Throat: Mucous membranes are moist.       Neck: Supple with no signs of meningismus.  Cardiovascular: Tachycardic with regular rhythm.  Respiratory: Normal respiratory effort. Lungs are clear to auscultation bilaterally. No wheezes, crackles, or rhonchi.  Gastrointestinal: Soft, non tender. Musculoskeletal: No edema, cyanosis, or erythema of extremities. Neurologic: Normal speech and language. Face is symmetric. Moving all extremities. No gross focal neurologic deficits are appreciated. Skin: Skin is warm, dry and intact. No rash noted. Psychiatric: Mood and affect are normal. Speech and behavior are normal.  ____________________________________________   LABS (all labs ordered are listed, but only abnormal results are displayed)  Labs Reviewed  CBC - Abnormal; Notable for the following components:      Result Value   WBC 15.0 (*)    RBC 3.81 (*)    Hemoglobin 11.3 (*)    HCT 34.3 (*)    All other components within normal limits  COMPREHENSIVE METABOLIC PANEL - Abnormal; Notable for the following components:   Potassium 3.4 (*)    Glucose, Bld 120 (*)    BUN 34 (*)    Creatinine, Ser 6.35 (*)    Total Protein 8.7 (*)    GFR calc non Af  Amer 9 (*)    GFR calc Af Amer 10 (*)    All other components within normal limits  TROPONIN I (HIGH SENSITIVITY) - Abnormal; Notable for the following components:   Troponin I (High Sensitivity) 37 (*)    All other components within normal limits  TROPONIN I (HIGH SENSITIVITY) - Abnormal; Notable for the following components:   Troponin I (High Sensitivity) 32 (*)    All other components within normal limits   ____________________________________________  EKG  ED ECG REPORT I, Rudene Re, the attending physician, personally viewed and interpreted this ECG.  Sinus tachycardia, rate of 106, normal intervals, anterior Q waves, ST depressions on lateral leads with no ST elevation.  No significant changes when compared to prior. ____________________________________________  RADIOLOGY  I have personally reviewed the images performed during this visit and I agree with the Radiologist's read.   Interpretation by Radiologist:  DG Chest Port 1 View  Result Date: 06/29/2019 CLINICAL DATA:  Shortness of breath.  Dialysis patient. EXAM: PORTABLE CHEST 1 VIEW COMPARISON:  May 12, 2019 FINDINGS: Stable mild cardiomegaly. Hila and mediastinum are normal. A left-sided dialysis catheter is now identified with the distal tip in the central SVC. No pneumothorax. There is a rounded density in the lateral right lung base not seen on previous imaging. The density is denser than adjacent bone, likely a calcified nodule or fluid is a shadows. No overt edema. Mild bibasilar atelectasis. IMPRESSION: 1. Support apparatus as above. 2. Mild cardiomegaly. There may be mild pulmonary venous congestion but there is no overt edema. 3. No other acute abnormalities. Electronically Signed   By: Dorise Bullion III M.D   On: 06/29/2019 04:12     ____________________________________________   PROCEDURES  Procedure(s) performed:yes .1-3 Lead EKG Interpretation Performed by: Rudene Re, MD Authorized  by: Rudene Re, MD     Interpretation: non-specific     ECG rate assessment: tachycardic     Rhythm: sinus tachycardia     Ectopy: none     Critical Care performed:  None ____________________________________________   INITIAL IMPRESSION / ASSESSMENT AND PLAN / ED COURSE   64 y.o. male with h/o ESRD on HD (TTS), COPD, diabetes, hypertension hyperlipidemia who presents for evaluation of shortness of breath and wheezing.  Patient received 2 breathing treatments in route and reports full resolution of his symptoms upon arrival to the emergency room.  He is severely hypertensive with a BP of 227/107.  Denies any chest pain or shortness of breath.  Has no oxygen requirement.  He is moving air with no wheezing or crackles.  Looks euvolemic.  EKG showing sinus tachycardia with no significant changes when compared to prior.  Chest x-ray with mild cardiomegaly but no overt pulmonary edema, confirmed by radiology.  Labs show leukocytosis which seems to be chronic for patient.  He denies any fevers at home or cough.  Initial troponin of 37.  Second is pending.  Most likely baseline for patient in the setting of ESRD.  No indication for emergent dialysis.  Potassium is 3.4.  Presentation most likely consistent with COPD or bronchitis.  Patient was given a dose of his home hydralazine for elevated blood pressure.  Will give steroids for COPD exacerbation.  We will continue to monitor closely on telemetry.  Old medical records reviewed.  _________________________ 6:56 AM on 06/29/2019 -----------------------------------------  Patient remains with no recurrence IVs shortness of breath.  No hypoxia at rest and with ambulation.  Repeat troponin is stable.  We have contacted his dialysis center for a later appointment.  Unable to reach them.  Patient is unable to get there on time for his appointment at 7:30 AM.  We will keep trying before discharge. Patient be discharged with a prescription for albuterol  and prednisone for a COPD exacerbation. Plan to have patient's dialysis appointment moved for later today or stay here for dialysis before discharge. Care transferred to Dr. Joni Fears    _____________________________________________ Please note:  Patient was evaluated in Emergency Department today for the symptoms described in the history of present illness. Patient was evaluated in the context of the global COVID-19 pandemic, which necessitated consideration that the patient might be at risk for infection with the SARS-CoV-2 virus that causes COVID-19. Institutional protocols and algorithms that pertain to the evaluation of patients at risk for COVID-19 are in a state of rapid change based on information released by regulatory bodies including the CDC and federal and state organizations. These policies and algorithms were followed during the patient's care in the ED.  Some ED evaluations and interventions may be delayed as a result of limited staffing during the pandemic.   [place] Controlled Substance Database was reviewed by me. ____________________________________________   FINAL CLINICAL IMPRESSION(S) / ED DIAGNOSES   Final diagnoses:  COPD exacerbation ([place])      NEW MEDICATIONS STARTED DURING THIS VISIT:  ED Discharge Orders         Ordered    albuterol (VENTOLIN HFA) 108 (90 Base) MCG/ACT inhaler  Every 6 hours PRN     Discontinue  Reprint     06/29/19 0657    predniSONE (DELTASONE) 20 MG tablet  Daily     Discontinue  Reprint     06/29/19 0657           Note:  This document was prepared using Dragon voice recognition software and may include unintentional dictation errors.    Alfred Levins, [place], MD 06/29/19 [phone]

## 2019-07-09 ENCOUNTER — Other Ambulatory Visit
Admission: RE | Admit: 2019-07-09 | Discharge: 2019-07-09 | Disposition: A | Discharge status: Home / Self Care | Payer: Medicare Other | Source: Ambulatory Visit | Attending: Vascular Surgery | Admitting: Vascular Surgery

## 2019-07-09 ENCOUNTER — Telehealth (INDEPENDENT_AMBULATORY_CARE_PROVIDER_SITE_OTHER): Payer: Self-pay

## 2019-07-09 ENCOUNTER — Other Ambulatory Visit (INDEPENDENT_AMBULATORY_CARE_PROVIDER_SITE_OTHER): Payer: Self-pay | Admitting: Nurse Practitioner

## 2019-07-09 ENCOUNTER — Ambulatory Visit: Admission: RE | Admit: 2019-07-09 | Payer: Medicare Other | Source: Home / Self Care | Admitting: Vascular Surgery

## 2019-07-09 SURGERY — DIALYSIS/PERMA CATHETER INSERTION
Anesthesia: Moderate Sedation

## 2019-07-09 NOTE — Telephone Encounter (Signed)
As was discussed previously [facility] needs to send over an order for the patient,  once the order is received the patient will be worked up.

## 2019-07-09 NOTE — Telephone Encounter (Signed)
Misty Called and wanted to know if the patient could be seen for a cath exchange they tried to cath flow but it didn't work he hasn't dialysis since Saturday an hans not ate this morning.

## 2019-07-09 NOTE — Pre-Procedure Instructions (Signed)
Attempted to make contact with patient via phone (his preferred number which is his aunts house and she said he doesn't  live there anymore). She did state that he was at dialysis.  Contacted Dr. Hal Morales office and spoke with Mickel Baas. Told her that he did not show today for covid testing. She said he was to have come this morning.

## 2019-07-09 NOTE — Telephone Encounter (Signed)
I called the patient at [facility] and through the nurse the patient was given the information to have his covid test today at the [facility] ASAP and arrive to the [facility] at 1:45 pm for a permcath exchange with Dr. Delana Meyer. Pre-procedure instructions were discussed and patient agreed.

## 2019-07-09 NOTE — Telephone Encounter (Signed)
I have called [name] and made Daniel Thomas aware she said that she is sending the order  right over.

## 2019-07-12 ENCOUNTER — Ambulatory Visit: Admission: RE | Admit: 2019-07-12 | Payer: Medicare Other | Source: Ambulatory Visit

## 2019-07-17 ENCOUNTER — Telehealth: Payer: Self-pay | Admitting: Urology

## 2019-07-29 ENCOUNTER — Ambulatory Visit: Admission: RE | Admit: 2019-07-29 | Payer: Medicare Other | Source: Ambulatory Visit

## 2019-07-31 ENCOUNTER — Ambulatory Visit: Payer: Medicare Other | Admitting: Urology

## 2019-08-01 ENCOUNTER — Telehealth: Payer: Self-pay | Admitting: Urology

## 2019-08-01 NOTE — Telephone Encounter (Signed)
Unfortunately this patient no showed again for his prostate MRI.  Sharyn Lull, can you follow-up with this patient and his brother?  Hollice Espy, MD

## 2019-08-21 NOTE — Telephone Encounter (Signed)
I spoke with his son and he said that he doesn't want to do it because he is terrified of the enema. He said he will call and get it rescheduled and try and talk him into going, but if he didn't have to to that he would do it.

## 2019-08-23 NOTE — Telephone Encounter (Signed)
Just spoke with MRI and they are ok with him just doing a laxative the night before. Spoke with his son and he is calling to schedule today.

## 2019-08-23 NOTE — Telephone Encounter (Signed)
They told him he had to do it or they could not do the MRI? I can call and ask about it.

## 2019-08-23 NOTE — Telephone Encounter (Signed)
Just have him try without doing it.  Depending on the amount of stool in his vault, it may or may not affect the quality of the study.    Hollice Espy, MD

## 2019-10-15 ENCOUNTER — Other Ambulatory Visit: Payer: Self-pay

## 2019-10-15 ENCOUNTER — Telehealth (INDEPENDENT_AMBULATORY_CARE_PROVIDER_SITE_OTHER): Payer: Self-pay

## 2019-10-15 ENCOUNTER — Other Ambulatory Visit
Admission: RE | Admit: 2019-10-15 | Discharge: 2019-10-15 | Disposition: A | Discharge status: Home / Self Care | Payer: Medicare Other | Source: Ambulatory Visit | Attending: Vascular Surgery | Admitting: Vascular Surgery

## 2019-10-15 DIAGNOSIS — Z20822 Contact with and (suspected) exposure to covid-19: Secondary | ICD-10-CM | POA: Diagnosis not present

## 2019-10-15 DIAGNOSIS — Z01812 Encounter for preprocedural laboratory examination: Secondary | ICD-10-CM | POA: Insufficient documentation

## 2019-10-15 NOTE — Telephone Encounter (Signed)
I attempted to contact the patient and was unable to leave a message due to the voicemail being full. I then reached out to [facility] at [facility] and through her the patient is scheduled for a permcath exchange with Dr. Lucky Cowboy on 10/17/19 with a 7:30 am arrival time to the MM. Covid testing on 10/15/19 between 8-1 pm at the [place]. Pre-procedure instructions will be faxed to [facility] at [facility].

## 2019-10-16 ENCOUNTER — Other Ambulatory Visit (INDEPENDENT_AMBULATORY_CARE_PROVIDER_SITE_OTHER): Payer: Self-pay | Admitting: Nurse Practitioner

## 2019-10-16 LAB — SARS CORONAVIRUS 2 (TAT 6-24 HRS): SARS Coronavirus 2: NEGATIVE

## 2019-10-17 ENCOUNTER — Encounter: Payer: Self-pay | Admitting: Vascular Surgery

## 2019-10-17 ENCOUNTER — Encounter
Admission: RE | Disposition: A | Discharge status: Home / Self Care | Payer: Self-pay | Source: Home / Self Care | Attending: Vascular Surgery

## 2019-10-17 ENCOUNTER — Other Ambulatory Visit: Payer: Self-pay

## 2019-10-17 ENCOUNTER — Ambulatory Visit
Admission: RE | Admit: 2019-10-17 | Discharge: 2019-10-17 | Disposition: A | Discharge status: Home / Self Care | Payer: Medicare Other | Attending: Vascular Surgery | Admitting: Vascular Surgery

## 2019-10-17 DIAGNOSIS — N185 Chronic kidney disease, stage 5: Secondary | ICD-10-CM

## 2019-10-17 DIAGNOSIS — I120 Hypertensive chronic kidney disease with stage 5 chronic kidney disease or end stage renal disease: Secondary | ICD-10-CM | POA: Diagnosis not present

## 2019-10-17 DIAGNOSIS — N186 End stage renal disease: Secondary | ICD-10-CM | POA: Diagnosis not present

## 2019-10-17 DIAGNOSIS — Z4901 Encounter for fitting and adjustment of extracorporeal dialysis catheter: Secondary | ICD-10-CM | POA: Insufficient documentation

## 2019-10-17 HISTORY — PX: DIALYSIS/PERMA CATHETER INSERTION: CATH118288

## 2019-10-17 LAB — GLUCOSE, CAPILLARY: Glucose-Capillary: 117 mg/dL — ABNORMAL HIGH (ref 70–99)

## 2019-10-17 LAB — POTASSIUM (ARMC VASCULAR LAB ONLY): Potassium (ARMC vascular lab): 6.1 — ABNORMAL HIGH (ref 3.5–5.1)

## 2019-10-17 SURGERY — DIALYSIS/PERMA CATHETER INSERTION
Anesthesia: Moderate Sedation

## 2019-10-17 MED ORDER — METHYLPREDNISOLONE SODIUM SUCC 125 MG IJ SOLR: 125.0000 mg | Freq: Once | INTRAMUSCULAR | Status: DC | PRN

## 2019-10-17 MED ORDER — FENTANYL CITRATE (PF) 100 MCG/2ML IJ SOLN
INTRAMUSCULAR | Status: DC | PRN
Start: 2019-10-17 — End: 2019-10-17
  Administered 2019-10-17: 50 ug
  Administered 2019-10-17: 25 ug
  Administered 2019-10-17: 50 ug via INTRAVENOUS

## 2019-10-17 MED ORDER — MIDAZOLAM HCL 2 MG/ML PO SYRP: 8.0000 mg | ORAL_SOLUTION | Freq: Once | ORAL | Status: DC | PRN

## 2019-10-17 MED ORDER — HYDROMORPHONE HCL 1 MG/ML IJ SOLN: 1.0000 mg | Freq: Once | INTRAMUSCULAR | Status: DC | PRN

## 2019-10-17 MED ORDER — FAMOTIDINE 20 MG PO TABS: 40.0000 mg | ORAL_TABLET | Freq: Once | ORAL | Status: DC | PRN

## 2019-10-17 MED ORDER — CEFAZOLIN SODIUM-DEXTROSE 1-4 GM/50ML-% IV SOLN: 1.0000 g | Freq: Once | INTRAVENOUS | Status: AC

## 2019-10-17 MED ORDER — MIDAZOLAM HCL 5 MG/5ML IJ SOLN
INTRAMUSCULAR | Status: AC
  Filled 2019-10-17: qty 5

## 2019-10-17 MED ORDER — MIDAZOLAM HCL 2 MG/2ML IJ SOLN
INTRAMUSCULAR | Status: DC | PRN
  Administered 2019-10-17: 2 mg
  Administered 2019-10-17: 1 mg
  Administered 2019-10-17: 2 mg via INTRAVENOUS

## 2019-10-17 MED ORDER — CEFAZOLIN SODIUM-DEXTROSE 1-4 GM/50ML-% IV SOLN
INTRAVENOUS | Status: AC
  Administered 2019-10-17: 1 g via INTRAVENOUS
  Filled 2019-10-17: qty 50

## 2019-10-17 MED ORDER — SODIUM CHLORIDE 0.9 % IV SOLN: INTRAVENOUS | Status: DC

## 2019-10-17 MED ORDER — DIPHENHYDRAMINE HCL 50 MG/ML IJ SOLN: 50.0000 mg | Freq: Once | INTRAMUSCULAR | Status: DC | PRN

## 2019-10-17 MED ORDER — ONDANSETRON HCL 4 MG/2ML IJ SOLN: 4.0000 mg | Freq: Four times a day (QID) | INTRAMUSCULAR | Status: DC | PRN

## 2019-10-17 MED ORDER — FENTANYL CITRATE (PF) 100 MCG/2ML IJ SOLN
INTRAMUSCULAR | Status: AC
  Filled 2019-10-17: qty 2

## 2019-10-17 SURGICAL SUPPLY — 7 items
CATH PALINDROME-P 23CM W/VT (CATHETERS) ×2 IMPLANT
DERMABOND ADVANCED (GAUZE/BANDAGES/DRESSINGS) ×1
DERMABOND ADVANCED .7 DNX12 (GAUZE/BANDAGES/DRESSINGS) ×1 IMPLANT
GUIDEWIRE SUPER STIFF .035X180 (WIRE) ×2 IMPLANT
PACK ANGIOGRAPHY (CUSTOM PROCEDURE TRAY) ×2 IMPLANT
SUT MNCRL AB 4-0 PS2 18 (SUTURE) ×2 IMPLANT
SUT PROLENE 0 CT 1 30 (SUTURE) ×2 IMPLANT

## 2019-10-17 NOTE — Op Note (Signed)
OPERATIVE NOTE    PRE-OPERATIVE DIAGNOSIS: 1. ESRD 2. Non-functional permcath  POST-OPERATIVE DIAGNOSIS: same as above  PROCEDURE: 1. Fluoroscopic guidance for placement of catheter 2. Placement of a 23 cm tip to cuff tunneled hemodialysis catheter via the left internal jugular vein and removal of previous catheter  SURGEON: Leotis Pain, MD  ANESTHESIA:  Local with moderate conscious sedation for 18 minutes using 3 mg of Versed and 75 mcg of Fentanyl  ESTIMATED BLOOD LOSS: 3 cc  FINDING(S): none  SPECIMEN(S):  None  INDICATIONS:   Patient is a 64 y.o.male who presents with non-functional dialysis catheter and ESRD.  The patient needs long term dialysis access for their ESRD, and a Permcath is necessary.  Risks and benefits are discussed and informed consent is obtained.    DESCRIPTION: After obtaining full informed written consent, the patient was brought back to the vascular suite. The patient received moderate conscious sedation during a face-to-face encounter with me present throughout the entire procedure and supervising the RN monitoring the vital signs, pulse oximetry, telemetry, and mental status throughout the entire procedure. The patient's existing catheter, left neck and chest were sterilely prepped and draped in a sterile surgical field was created.  The existing catheter was dissected free from the fibrous sheath securing the cuff with hemostats and blunt dissection.  A wire was placed. The existing catheter was then removed and the wire used to keep venous access. I selected a 23 cm tip to cuff tunneled dialysis catheter.  Using fluoroscopic guidance the catheter tips were parked in the right atrium. The appropriate distal connectors were placed. It withdrew blood well and flushed easily with heparinized saline and a concentrated heparin solution was then placed. It was secured to the chest wall with 2 Prolene sutures. A 4-0 Monocryl pursestring suture was placed around the  exit site. Sterile dressings were placed. The patient tolerated the procedure well and was taken to the recovery room in stable condition.  COMPLICATIONS: None  CONDITION: Stable  Leotis Pain 10/17/2019 9:11 AM   This note was created with Dragon Medical transcription system. Any errors in dictation are purely unintentional.

## 2019-10-17 NOTE — H&P (Signed)
[facility] Admission History & Physical  MRN : [id number]  Daniel Thomas is a 65 y.o. (1955/01/17) male who presents with chief complaint of scheduled PermCath exchange.  History of Present Illness:  I am asked to evaluate the patient by the dialysis center. The patient was sent here because they were unable to achieve adequate dialysis yesterday. Furthermore the Center states they were unable to aspirate either lumen of the catheter yesterday.  This problem has been getting worse for about 1 week. The patient is unaware of any other change.   Patient denies pain or tenderness overlying the access.  There is no pain with dialysis.  Patient denies fevers or shaking chills while on dialysis.    Current Facility-Administered Medications  Medication Dose Route Frequency Provider Last Rate Last Admin  . 0.9 %  sodium chloride infusion   Intravenous Continuous Eulogio Ditch E, NP      . ceFAZolin (ANCEF) IVPB 1 g/50 mL premix  1 g Intravenous Once Kris Hartmann, NP 100 mL/hr at 10/17/19 0858 1 g at 10/17/19 0858  . diphenhydrAMINE (BENADRYL) injection 50 mg  50 mg Intravenous Once PRN Kris Hartmann, NP      . famotidine (PEPCID) tablet 40 mg  40 mg Oral Once PRN Kris Hartmann, NP      . fentaNYL (SUBLIMAZE) 100 MCG/2ML injection           . fentaNYL (SUBLIMAZE) injection    PRN Algernon Huxley, MD   25 mcg at 10/17/19 0854  . HYDROmorphone (DILAUDID) injection 1 mg  1 mg Intravenous Once PRN Eulogio Ditch E, NP      . methylPREDNISolone sodium succinate (SOLU-MEDROL) 125 mg/2 mL injection 125 mg  125 mg Intravenous Once PRN Eulogio Ditch E, NP      . midazolam (VERSED) 2 MG/ML syrup 8 mg  8 mg Oral Once PRN Kris Hartmann, NP      . midazolam (VERSED) 5 MG/5ML injection           . midazolam (VERSED) injection    PRN Algernon Huxley, MD   1 mg at 10/17/19 0854  . ondansetron (ZOFRAN) injection 4 mg  4 mg Intravenous Q6H PRN Kris Hartmann, NP       Past Surgical  History:  Procedure Laterality Date  . DIALYSIS/PERMA CATHETER INSERTION N/A 01/12/2018   Procedure: DIALYSIS/PERMA CATHETER INSERTION;  Surgeon: Katha Cabal, MD;  Location: [facility];  Service: Cardiovascular;  Laterality: N/A;  . DIALYSIS/PERMA CATHETER INSERTION N/A 05/16/2019   Procedure: DIALYSIS/PERMA CATHETER INSERTION;  Surgeon: Algernon Huxley, MD;  Location: [facility];  Service: Cardiovascular;  Laterality: N/A;  . DIALYSIS/PERMA CATHETER REMOVAL N/A 05/13/2019   Procedure: DIALYSIS/PERMA CATHETER REMOVAL;  Surgeon: Algernon Huxley, MD;  Location: [facility];  Service: Cardiovascular;  Laterality: N/A;  . TEE WITHOUT CARDIOVERSION N/A 05/15/2019   Procedure: TRANSESOPHAGEAL ECHOCARDIOGRAM (TEE);  Surgeon: Teodoro Spray, MD;  Location: [facility];  Service: Cardiovascular;  Laterality: N/A;   Social History Social History   Tobacco Use  . Smoking status: Former Smoker    Years: 20.00  . Smokeless tobacco: Never Used  Vaping Use  . Vaping Use: Never used  Substance Use Topics  . Alcohol use: Never  . Drug use: Never   Family History History reviewed. No pertinent family history.   No family history of bleeding or clotting disorders, autoimmune disease or porphyria  No Known Allergies  REVIEW OF SYSTEMS (Negative unless checked)  Constitutional: [] Weight loss  [] Fever  [] Chills Cardiac: [] Chest pain   [] Chest pressure   [] Palpitations   [] Shortness of breath when laying flat   [] Shortness of breath at rest   [x] Shortness of breath with exertion. Vascular:  [] Pain in legs with walking   [] Pain in legs at rest   [] Pain in legs when laying flat   [] Claudication   [] Pain in feet when walking  [] Pain in feet at rest  [] Pain in feet when laying flat   [] History of DVT   [] Phlebitis   [] Swelling in legs   [] Varicose veins   [] Non-healing ulcers Pulmonary:   [] Uses home oxygen   [] Productive cough   [] Hemoptysis   [] Wheeze  [] COPD   [] Asthma Neurologic:   [] Dizziness  [] Blackouts   [] Seizures   [] History of stroke   [] History of TIA  [] Aphasia   [] Temporary blindness   [] Dysphagia   [] Weakness or numbness in arms   [] Weakness or numbness in legs Musculoskeletal:  [] Arthritis   [] Joint swelling   [] Joint pain   [] Low back pain Hematologic:  [] Easy bruising  [] Easy bleeding   [] Hypercoagulable state   [] Anemic  [] Hepatitis Gastrointestinal:  [] Blood in stool   [] Vomiting blood  [] Gastroesophageal reflux/heartburn   [] Difficulty swallowing. Genitourinary:  [x] Chronic kidney disease   [] Difficult urination  [] Frequent urination  [] Burning with urination   [] Blood in urine Skin:  [] Rashes   [] Ulcers   [] Wounds Psychological:  [] History of anxiety   []  History of major depression.  Physical Examination  Vitals:   10/17/19 0730 10/17/19 0850 10/17/19 0855 10/17/19 0900  BP: (!) 223/97 (!) 227/116 (!) 213/95 (!) 161/105  Pulse: 85     Resp: 18 (!) 24 (!) 23 12  Temp: 98.2 F (36.8 C)     TempSrc: Oral     SpO2:  100% 100% 100%  Weight: 72.6 kg     Height: 5\' 1"  (1.549 m)      Body mass index is 30.23 kg/m. Gen: WD/WN, NAD Head: [place]/AT, No temporalis wasting. Prominent temp pulse not noted. Ear/Nose/Throat: Hearing grossly intact, nares w/o erythema or drainage, oropharynx w/o Erythema/Exudate,  Eyes: Conjunctiva clear, sclera non-icteric Neck: Trachea midline.  No JVD.  Pulmonary:  Good air movement, respirations not labored, no use of accessory muscles.  Cardiac: RRR, normal S1, S2. Vascular:  tunneled catheter without tenderness or drainage Vessel Right Left  Radial Palpable Palpable  Gastrointestinal: soft, non-tender/non-distended. No guarding/reflex.  Musculoskeletal: M/S 5/5 throughout.  Extremities without ischemic changes.  No deformity or atrophy.  Neurologic: Sensation grossly intact in extremities.  Symmetrical.  Speech is fluent. Motor exam as listed above. Psychiatric: Judgment intact, Mood & affect appropriate for pt's  clinical situation. Dermatologic: No rashes or ulcers noted.  No cellulitis or open wounds. Lymph : No Cervical, Axillary, or Inguinal lymphadenopathy.  CBC Lab Results  Component Value Date   WBC 15.0 (H) 06/29/2019   HGB 11.3 (L) 06/29/2019   HCT 34.3 (L) 06/29/2019   MCV 90.0 06/29/2019   PLT 385 06/29/2019   BMET    Component Value Date/Time   NA 137 06/29/2019 0340   K 3.4 (L) 06/29/2019 0340   CL 99 06/29/2019 0340   CO2 26 06/29/2019 0340   GLUCOSE 120 (H) 06/29/2019 0340   BUN 34 (H) 06/29/2019 0340   CREATININE 6.35 (H) 06/29/2019 0340   CALCIUM 9.4 06/29/2019 0340   GFRNONAA 9 (L) 06/29/2019 0340  GFRAA 10 (L) 06/29/2019 0340   CrCl cannot be calculated (Patient's most recent lab result is older than the maximum 21 days allowed.).  COAG Lab Results  Component Value Date   INR 1.1 05/12/2019   Radiology No results found.  Assessment/Plan 1.  Complication dialysis device with thrombosis AV access:  Patient's tunneled catheter is thrombosed. The patient will undergo exchange of the catheter same venous access using interventional techniques.  The risks and benefits were described to the patient.  All questions were answered.  The patient agrees to proceed with intervention.  2.  End-stage renal disease requiring hemodialysis:  Patient will continue dialysis therapy without further interruption if a successful exchange is not achieved then new site will be found for tunneled catheter placement. Dialysis has already been arranged since the patient missed their previous session 3.  Hypertension:  Patient will continue medical management; nephrology is following no changes in oral medications.  Discussed with [place], PA-C  10/17/2019 9:10 AM

## 2019-10-17 NOTE — Discharge Instructions (Signed)
Moderate Conscious Sedation, Adult, Care After These instructions provide you with information about caring for yourself after your procedure. Your health care provider may also give you more specific instructions. Your treatment has been planned according to current medical practices, but problems sometimes occur. Call your health care provider if you have any problems or questions after your procedure. What can I expect after the procedure? After your procedure, it is common:  To feel sleepy for several hours.  To feel clumsy and have poor balance for several hours.  To have poor judgment for several hours.  To vomit if you eat too soon. Follow these instructions at home: For at least 24 hours after the procedure:   Do not: ? Participate in activities where you could fall or become injured. ? Drive. ? Use heavy machinery. ? Drink alcohol. ? Take sleeping pills or medicines that cause drowsiness. ? Make important decisions or sign legal documents. ? Take care of children on your own.  Rest. Eating and drinking  Follow the diet recommended by your health care provider.  If you vomit: ? Drink water, juice, or soup when you can drink without vomiting. ? Make sure you have little or no nausea before eating solid foods. General instructions  Have a responsible adult stay with you until you are awake and alert.  Take over-the-counter and prescription medicines only as told by your health care provider.  If you smoke, do not smoke without supervision.  Keep all follow-up visits as told by your health care provider. This is important. Contact a health care provider if:  You keep feeling nauseous or you keep vomiting.  You feel light-headed.  You develop a rash.  You have a fever. Get help right away if:  You have trouble breathing. This information is not intended to replace advice given to you by your health care provider. Make sure you discuss any questions you have  with your health care provider. Document Revised: 12/02/2016 Document Reviewed: 04/11/2015 Elsevier Patient Education  2020 Elsevier Inc. Tunneled Catheter Insertion, Care After This sheet gives you information about how to care for yourself after your procedure. Your health care provider may also give you more specific instructions. If you have problems or questions, contact your health care provider. What can I expect after the procedure? After the procedure, it is common to have:  Some mild redness, bruising, swelling, and pain around your catheter site.  A small amount of blood or clear fluid coming from your incisions. Follow these instructions at home: Incision care   Follow instructions from your health care provider about how to take care of your incisions. Make sure you: ? Wash your hands with soap and water before and after you change your bandages (dressings). If soap and water are not available, use hand sanitizer. ? Change your dressings as told by your health care provider. Wash the area around your incisions with a germ-killing (antiseptic) solution when you change your dressings. ? Leave stitches (sutures), skin glue, or adhesive strips in place. These skin closures may need to stay in place for 2 weeks or longer. If adhesive strip edges start to loosen and curl up, you may trim the loose edges. Do not remove adhesive strips completely unless your health care provider tells you to do that.  Keep your dressings clean and dry.  Check your incision areas every day for signs of infection. Check for: ? More redness, swelling, or pain. ? More fluid or blood. ? Warmth. ?   Pus or a bad smell. Catheter care   Wash your hands with soap and water before and after caring for your catheter. If soap and water are not available, use hand sanitizer.  Keep your catheter site clean and dry.  Apply an antibiotic ointment to your catheter site as told by your health care  provider.  Flush your catheter as told by your health care provider. This helps prevent it from becoming clogged.  Do not open the caps on the ends of the catheter.  Do not pull on your catheter. Medicines  Take over-the-counter and prescription medicines only as told by your health care provider.  If you were prescribed an antibiotic medicine, take it as told by your health care provider. Do not stop taking the antibiotic even if you start to feel better. Activity  Return to your normal activities as told by your health care provider. Ask your health care provider what activities are safe for you.  Follow any other activity restrictions as instructed by your health care provider.  Do not lift anything that is heavier than 10 lb (4.5 kg), or the limit that you are told, until your health care provider says that it is safe. Driving  Do not drive until your health care provider approves.  Ask your health care provider if the medicine prescribed to you requires you to avoid driving or using heavy machinery. General instructions  Follow your health care provider's specific instructions for the type of catheter that you have.  Do not take baths, swim, or use a hot tub until your health care provider approves. Ask your health care provider if you may take showers.  Keep all follow-up visits as told by your health care provider. This is important. Contact a health care provider if:  You feel unusually weak or nauseous.  You have more redness, swelling, or pain at your incisions or around the area where your catheter is inserted.  Your catheter is not working properly.  You are unable to flush your catheter. Get help right away if:  Your catheter develops a hole or it breaks.  You have pain or swelling when fluids or medicines are being given through the catheter.  Fluid is leaking from the catheter, under the dressing, or around the dressing.  Your catheter comes loose or  gets pulled completely out. If this happens, press on your catheter site firmly with a clean cloth until you can get medical help.  You have swelling in your shoulder, neck, chest, or face.  You have chest pain or difficulty breathing.  You feel dizzy or light-headed.  You have pus or a bad smell coming from your catheter site.  You have a fever or chills.  Your catheter site feels warm to the touch.  You develop bleeding from your catheter or your insertion site, and your bleeding does not stop. Summary  After the procedure, it is common to have mild redness, swelling, and pain around your catheter site.  Return to your normal activities as told by your health care provider. Ask your health care provider what activities are safe for you.  Follow your health care provider's specific instructions for the type of catheter that you have.  Keep your catheter site and your dressings clean and dry.  Contact a health care provider if your catheter is not working properly. Get help right away if you have chest pain, fever, or difficulty breathing. This information is not intended to replace advice given to you   by your health care provider. Make sure you discuss any questions you have with your health care provider. Document Revised: 12/12/2017 Document Reviewed: 12/12/2017 Elsevier Patient Education  2020 Elsevier Inc.  

## 2019-10-24 ENCOUNTER — Other Ambulatory Visit: Payer: Self-pay

## 2019-10-24 DIAGNOSIS — C61 Malignant neoplasm of prostate: Secondary | ICD-10-CM

## 2019-10-25 ENCOUNTER — Other Ambulatory Visit: Payer: Self-pay

## 2019-10-25 ENCOUNTER — Other Ambulatory Visit: Payer: Medicare Other

## 2019-10-25 DIAGNOSIS — C61 Malignant neoplasm of prostate: Secondary | ICD-10-CM

## 2019-10-26 LAB — PSA: Prostate Specific Ag, Serum: 20.2 ng/mL — ABNORMAL HIGH (ref 0.0–4.0)

## 2019-10-29 ENCOUNTER — Encounter: Payer: Self-pay | Admitting: Urology

## 2019-10-29 ENCOUNTER — Emergency Department
Admission: EM | Admit: 2019-10-29 | Discharge: 2019-10-29 | Disposition: A | Discharge status: Home / Self Care | Payer: Medicare Other | Attending: Emergency Medicine | Admitting: Emergency Medicine

## 2019-10-29 ENCOUNTER — Ambulatory Visit: Payer: Medicare Other | Admitting: Urology

## 2019-10-29 ENCOUNTER — Other Ambulatory Visit: Payer: Self-pay

## 2019-10-29 DIAGNOSIS — Z79899 Other long term (current) drug therapy: Secondary | ICD-10-CM | POA: Diagnosis not present

## 2019-10-29 DIAGNOSIS — Z87891 Personal history of nicotine dependence: Secondary | ICD-10-CM | POA: Insufficient documentation

## 2019-10-29 DIAGNOSIS — Z992 Dependence on renal dialysis: Secondary | ICD-10-CM | POA: Diagnosis not present

## 2019-10-29 DIAGNOSIS — N186 End stage renal disease: Secondary | ICD-10-CM | POA: Diagnosis not present

## 2019-10-29 DIAGNOSIS — I120 Hypertensive chronic kidney disease with stage 5 chronic kidney disease or end stage renal disease: Secondary | ICD-10-CM | POA: Insufficient documentation

## 2019-10-29 DIAGNOSIS — R55 Syncope and collapse: Secondary | ICD-10-CM | POA: Diagnosis present

## 2019-10-29 DIAGNOSIS — E1122 Type 2 diabetes mellitus with diabetic chronic kidney disease: Secondary | ICD-10-CM | POA: Insufficient documentation

## 2019-10-29 DIAGNOSIS — J449 Chronic obstructive pulmonary disease, unspecified: Secondary | ICD-10-CM | POA: Insufficient documentation

## 2019-10-29 LAB — CBC
HCT: 32.3 % — ABNORMAL LOW (ref 39.0–52.0)
Hemoglobin: 11.3 g/dL — ABNORMAL LOW (ref 13.0–17.0)
MCH: 30.2 pg (ref 26.0–34.0)
MCHC: 35 g/dL (ref 30.0–36.0)
MCV: 86.4 fL (ref 80.0–100.0)
Platelets: 272 10*3/uL (ref 150–400)
RBC: 3.74 MIL/uL — ABNORMAL LOW (ref 4.22–5.81)
RDW: 14 % (ref 11.5–15.5)
WBC: 7.2 10*3/uL (ref 4.0–10.5)
nRBC: 0 % (ref 0.0–0.2)

## 2019-10-29 LAB — BASIC METABOLIC PANEL
Anion gap: 12 (ref 5–15)
BUN: 30 mg/dL — ABNORMAL HIGH (ref 8–23)
CO2: 29 mmol/L (ref 22–32)
Calcium: 8.2 mg/dL — ABNORMAL LOW (ref 8.9–10.3)
Chloride: 92 mmol/L — ABNORMAL LOW (ref 98–111)
Creatinine, Ser: 5.54 mg/dL — ABNORMAL HIGH (ref 0.61–1.24)
GFR, Estimated: 11 mL/min — ABNORMAL LOW (ref >60)
Glucose, Bld: 133 mg/dL — ABNORMAL HIGH (ref 70–99)
Potassium: 4 mmol/L (ref 3.5–5.1)
Sodium: 133 mmol/L — ABNORMAL LOW (ref 135–145)

## 2019-10-29 MED ORDER — SODIUM CHLORIDE 0.9 % IV BOLUS
250.0000 mL | Freq: Once | INTRAVENOUS | Status: AC
  Administered 2019-10-29: 250 mL via INTRAVENOUS

## 2019-10-29 NOTE — ED Notes (Signed)
D/C and follow up discussed with pt. Pt hypertensive on D/C, MD aware. Pt educated to take daily BP meds when home, pt verbalized understanding. No distress noted. No signature pad in room for pt to sign

## 2019-10-29 NOTE — ED Provider Notes (Signed)
[facility] Emergency Department Provider Note  Time seen: 12:36 PM  I have reviewed the triage vital signs and the nursing notes.   HISTORY  Chief Complaint Loss of Consciousness   HPI Daniel Thomas is a 64 y.o. male with a past medical history of COPD, diabetes, ESRD on dialysis, hypertension, hyperlipidemia, presents to the emergency department after syncopal episode at dialysis today.  According to the patient he was getting dialysis when he began feeling very lightheaded nauseous and vomited.  Patient was noted to be hypotensive dialysis was stopped and the patient was brought to the emergency department.   EMS states patient initial systolic was 73, upon arrival to the emergency department patient is hypertensive around 170.  Patient states he is feeling much better but still feels a little fatigued.  Patient states a history of this occurring at dialysis in the past as well.  Patient denies any chest pain shortness of breath or fever.  Denies abdominal pain does state some loose stool but states no more than usual which is chronic for him.  Past Medical History:  Diagnosis Date  . COPD (chronic obstructive pulmonary disease) ([place])   . Diabetes mellitus without complication ([place])   . Enlarged prostate   . ESRD on dialysis ([place])   . Heart murmur   . High cholesterol   . Hypertension     Patient Active Problem List   Diagnosis Date Noted  . Bacterial infection, unspecified 05/17/2019  . Bacterial intestinal infection, unspecified 05/17/2019  . Septicemia due to coagulase-negative staphylococcal infection ([place]) 05/14/2019  . Sepsis ([place]) 05/12/2019  . ESRD (end stage renal disease) ([place]) 05/12/2019  . Leucocytosis 05/12/2019  . Dialysis AV fistula malfunction ([place]) 04/17/2018  . Anemia, unspecified 01/26/2018  . Benign prostatic hyperplasia without lower urinary tract symptoms 01/16/2018  . Chronic obstructive pulmonary disease, unspecified ([place])  01/16/2018  . Hyperkalemia 01/12/2018  . 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 DIALYSIS/PERMA CATHETER INSERTION N/A 01/12/2018   Procedure: DIALYSIS/PERMA CATHETER INSERTION;  Surgeon: Katha Cabal, MD;  Location: [facility];  Service: Cardiovascular;  Laterality: N/A;  . DIALYSIS/PERMA CATHETER INSERTION N/A 05/16/2019   Procedure: DIALYSIS/PERMA CATHETER INSERTION;  Surgeon: Algernon Huxley, MD;  Location: [facility];  Service: Cardiovascular;  Laterality: N/A;  . DIALYSIS/PERMA CATHETER INSERTION N/A 10/17/2019   Procedure: DIALYSIS/PERMA CATHETER INSERTION;  Surgeon: Algernon Huxley, MD;  Location: [facility];  Service: Cardiovascular;  Laterality: N/A;  . DIALYSIS/PERMA CATHETER REMOVAL N/A 05/13/2019   Procedure: DIALYSIS/PERMA CATHETER REMOVAL;  Surgeon: Algernon Huxley, MD;  Location: [facility];  Service: Cardiovascular;  Laterality: N/A;  . TEE WITHOUT CARDIOVERSION N/A 05/15/2019   Procedure: TRANSESOPHAGEAL ECHOCARDIOGRAM (TEE);  Surgeon: Teodoro Spray, MD;  Location: [facility];  Service: Cardiovascular;  Laterality: N/A;    Prior to Admission medications   Medication Sig Start Date End Date Taking? Authorizing Provider  albuterol (VENTOLIN HFA) 108 (90 Base) MCG/ACT inhaler Inhale 2 puffs into the lungs every 6 (six) hours as needed for wheezing or shortness of breath. 06/29/19   Alfred Levins, [place], MD  amLODipine (NORVASC) 10 MG tablet Take 1 tablet (10 mg total) by mouth daily. 01/08/18   Loletha Grayer, MD  atorvastatin (LIPITOR) 40 MG tablet  04/29/19   [provider]  calcium carbonate (TUMS - DOSED IN MG ELEMENTAL CALCIUM) 500 MG chewable tablet Chew 2.5 tablets (500 mg of  elemental calcium total) by mouth every 6 (six) hours as needed for indigestion. 05/17/19   Nolberto Hanlon, MD  carvedilol (COREG) 12.5 MG tablet Take 12.5 mg by mouth 2 (two) times daily with a meal. 03/31/18    [provider]  ceFAZolin (ANCEF) 1-4 GM/50ML-% SOLN Inject 50 mLs (1 g total) into the vein daily. 05/17/19   Nolberto Hanlon, MD  citalopram (CELEXA) 20 MG tablet Take 20 mg by mouth daily.    [provider]  ergocalciferol (DRISDOL) 1.25 MG (50000 UT) capsule Take by mouth. Administered at analysis Patient not taking: Reported on 10/17/2019 02/02/18   [provider]  feeding supplement, [place], ([place]) [facility] 04/29/19   [provider]  ferric citrate (AURYXIA) 1 GM 210 MG(Fe) tablet Take by mouth. Patient not taking: Reported on 10/17/2019 04/29/19   [provider]  hydrALAZINE (APRESOLINE) 25 MG tablet Take 1 tablet (25 mg total) by mouth every 8 (eight) hours. 05/17/19   Nolberto Hanlon, MD  linagliptin (TRADJENTA) 5 MG TABS tablet Take 1 tablet (5 mg total) by mouth daily. 01/09/18   Loletha Grayer, MD  lovastatin (MEVACOR) 40 MG tablet Take 1 tablet (40 mg total) by mouth at bedtime. 05/17/19   Nolberto Hanlon, MD  Methoxy PEG-Epoetin Beta (MIRCERA IJ) Mircera Patient not taking: Reported on 10/17/2019 01/08/19 03/03/20  [provider]  traZODone (DESYREL) 50 MG tablet Take 25 mg by mouth at bedtime as needed for sleep. 03/18/18   [provider]    No Known Allergies  No family history on file.  Social History Social History   Tobacco Use  . Smoking status: Former Smoker    Years: 20.00  . Smokeless tobacco: Never Used  Vaping Use  . Vaping Use: Never used  Substance Use Topics  . Alcohol use: Never  . Drug use: Never    Review of Systems Constitutional: Negative for fever.  Positive for fatigue. Cardiovascular: Negative for chest pain. Respiratory: Negative for shortness of breath. Gastrointestinal: Negative for abdominal pain.  Nausea vomiting while at dialysis, resolved currently.  Loose stool which is chronic. Genitourinary: Negative for urinary compaints Musculoskeletal: Negative for  musculoskeletal complaints Neurological: Negative for headache All other ROS negative  ____________________________________________   PHYSICAL EXAM:  VITAL SIGNS: ED Triage Vitals  Enc Vitals Group     BP 10/29/19 1152 (!) 173/81     Pulse Rate 10/29/19 1152 79     Resp 10/29/19 1152 20     Temp 10/29/19 1152 97.8 F (36.6 C)     Temp Source 10/29/19 1152 Oral     SpO2 10/29/19 1152 99 %     Weight 10/29/19 1153 160 lb (72.6 kg)     Height 10/29/19 1153 5\' 1"  (1.549 m)     Head Circumference --      Peak Flow --      Pain Score 10/29/19 1156 0     Pain Loc --      Pain Edu? --      Excl. in [place]? --    Constitutional: Alert and oriented. Well appearing and in no distress. Eyes: Normal exam ENT      Head: Normocephalic and atraumatic.      Mouth/Throat: Mucous membranes are moist. Cardiovascular: Normal rate, regular rhythm.  Respiratory: Normal respiratory effort without tachypnea nor retractions. Breath sounds are clear.  Left subclavian dialysis catheter present. Gastrointestinal: Soft and nontender. No distention.   Musculoskeletal: Nontender with normal range of  motion in all extremities. Neurologic:  Normal speech and language. No gross focal neurologic deficits  Skin:  Skin is warm, dry and intact.  Psychiatric: Mood and affect are normal.   ____________________________________________    EKG  EKG viewed and interpreted by myself shows what appears to be a sinus rhythm at 80 bpm with a narrow QRS, normal axis, normal intervals with nonspecific ST changes.  Electrical interference.  ____________________________________________   INITIAL IMPRESSION / ASSESSMENT AND PLAN / ED COURSE  Pertinent labs & imaging results that were available during my care of the patient were reviewed by me and considered in my medical decision making (see chart for details).   Patient presents to the emergency department after syncopal episode while at dialysis.  Patient states a  history of the same in the past.  Overall the patient appears well, states he is feeling much better but still feels a sense of generalized fatigue.  Patient's blood pressure is 173/81 currently.  We will dose a small amount of IV fluids 250 cc.  We will check basic labs and continue to closely monitor.  Patient agreeable to plan of care.  Labs are largely at the patient's baseline.  Reassuringly potassium is four-point.  Patient states he is feeling better states this occurs very often and he is ready to go home.  We will discharge patient home discussed follow-up precautions.  ROBLEY MATASSA was evaluated in Emergency Department on 10/29/2019 for the symptoms described in the history of present illness. He was evaluated in the context of the global COVID-19 pandemic, which necessitated consideration that the patient might be at risk for infection with the SARS-CoV-2 virus that causes COVID-19. Institutional protocols and algorithms that pertain to the evaluation of patients at risk for COVID-19 are in a state of rapid change based on information released by regulatory bodies including the CDC and federal and state organizations. These policies and algorithms were followed during the patient's care in the ED.  ____________________________________________   FINAL CLINICAL IMPRESSION(S) / ED DIAGNOSES  Syncope   Harvest Dark, MD 10/29/19 1410

## 2019-10-29 NOTE — ED Triage Notes (Signed)
Patient to ER from dialysis via [facility] for c/o syncopal episode during dialysis. Patient's initial systolic BP upon EMS arrival was 73. Patient's systolic upon arrival to ER for EMS was 180's. CBG 156. 12 lead unremarkable. Patient reported to EMS that he has had diarrhea for several days. Dialysis reported to EMS patient has h/o similar syncopal episodes.

## 2019-10-29 NOTE — ED Notes (Signed)
Pt presents to ED with a CC of having a snyocpal episode while at dyalisis. Pt states this happens often. Pt states this happened toward the end of his dialysis treatment. Pt states he felt normal upon starting treatment. Pt denies dizzy or any pain at this time. Pt states he was sitting down when the syncopal episode happened. Pt is currently A&Ox4. Pt presents hypertensive.

## 2019-11-10 ENCOUNTER — Emergency Department
Admission: EM | Admit: 2019-11-10 | Discharge: 2019-11-10 | Disposition: A | Discharge status: Home / Self Care | Payer: Medicare Other | Attending: Emergency Medicine | Admitting: Emergency Medicine

## 2019-11-10 ENCOUNTER — Emergency Department: Payer: Medicare Other

## 2019-11-10 DIAGNOSIS — J449 Chronic obstructive pulmonary disease, unspecified: Secondary | ICD-10-CM | POA: Insufficient documentation

## 2019-11-10 DIAGNOSIS — I120 Hypertensive chronic kidney disease with stage 5 chronic kidney disease or end stage renal disease: Secondary | ICD-10-CM | POA: Insufficient documentation

## 2019-11-10 DIAGNOSIS — S3992XA Unspecified injury of lower back, initial encounter: Secondary | ICD-10-CM | POA: Diagnosis present

## 2019-11-10 DIAGNOSIS — N186 End stage renal disease: Secondary | ICD-10-CM | POA: Diagnosis not present

## 2019-11-10 DIAGNOSIS — Z992 Dependence on renal dialysis: Secondary | ICD-10-CM | POA: Insufficient documentation

## 2019-11-10 DIAGNOSIS — S32010A Wedge compression fracture of first lumbar vertebra, initial encounter for closed fracture: Secondary | ICD-10-CM

## 2019-11-10 DIAGNOSIS — Z87891 Personal history of nicotine dependence: Secondary | ICD-10-CM | POA: Diagnosis not present

## 2019-11-10 DIAGNOSIS — M545 Low back pain, unspecified: Secondary | ICD-10-CM

## 2019-11-10 DIAGNOSIS — Z79899 Other long term (current) drug therapy: Secondary | ICD-10-CM | POA: Diagnosis not present

## 2019-11-10 DIAGNOSIS — E119 Type 2 diabetes mellitus without complications: Secondary | ICD-10-CM | POA: Diagnosis not present

## 2019-11-10 DIAGNOSIS — N3001 Acute cystitis with hematuria: Secondary | ICD-10-CM | POA: Diagnosis not present

## 2019-11-10 DIAGNOSIS — X58XXXA Exposure to other specified factors, initial encounter: Secondary | ICD-10-CM | POA: Insufficient documentation

## 2019-11-10 LAB — URINALYSIS, COMPLETE (UACMP) WITH MICROSCOPIC
Bilirubin Urine: NEGATIVE
Glucose, UA: NEGATIVE mg/dL
Ketones, ur: NEGATIVE mg/dL
Nitrite: NEGATIVE
Protein, ur: 100 mg/dL — AB
Specific Gravity, Urine: 1.006 (ref 1.005–1.030)
WBC, UA: >50 WBC/hpf — ABNORMAL HIGH (ref 0–5)
pH: 6 (ref 5.0–8.0)

## 2019-11-10 LAB — COMPREHENSIVE METABOLIC PANEL
ALT: 22 U/L (ref 0–44)
AST: 21 U/L (ref 15–41)
Albumin: 4.4 g/dL (ref 3.5–5.0)
Alkaline Phosphatase: 94 U/L (ref 38–126)
Anion gap: 13 (ref 5–15)
BUN: 33 mg/dL — ABNORMAL HIGH (ref 8–23)
CO2: 27 mmol/L (ref 22–32)
Calcium: 9.1 mg/dL (ref 8.9–10.3)
Chloride: 95 mmol/L — ABNORMAL LOW (ref 98–111)
Creatinine, Ser: 7.88 mg/dL — ABNORMAL HIGH (ref 0.61–1.24)
GFR, Estimated: 7 mL/min — ABNORMAL LOW (ref >60)
Glucose, Bld: 105 mg/dL — ABNORMAL HIGH (ref 70–99)
Potassium: 4.6 mmol/L (ref 3.5–5.1)
Sodium: 135 mmol/L (ref 135–145)
Total Bilirubin: 0.8 mg/dL (ref 0.3–1.2)
Total Protein: 8.5 g/dL — ABNORMAL HIGH (ref 6.5–8.1)

## 2019-11-10 LAB — CBC
HCT: 33.6 % — ABNORMAL LOW (ref 39.0–52.0)
Hemoglobin: 11 g/dL — ABNORMAL LOW (ref 13.0–17.0)
MCH: 29.6 pg (ref 26.0–34.0)
MCHC: 32.7 g/dL (ref 30.0–36.0)
MCV: 90.3 fL (ref 80.0–100.0)
Platelets: 361 10*3/uL (ref 150–400)
RBC: 3.72 MIL/uL — ABNORMAL LOW (ref 4.22–5.81)
RDW: 14.5 % (ref 11.5–15.5)
WBC: 9.9 10*3/uL (ref 4.0–10.5)
nRBC: 0 % (ref 0.0–0.2)

## 2019-11-10 LAB — TROPONIN I (HIGH SENSITIVITY)
Troponin I (High Sensitivity): 29 ng/L — ABNORMAL HIGH (ref <18)
Troponin I (High Sensitivity): 33 ng/L — ABNORMAL HIGH (ref <18)

## 2019-11-10 MED ORDER — CLONIDINE HCL 0.1 MG PO TABS: 0.1000 mg | ORAL_TABLET | Freq: Once | ORAL | Status: DC

## 2019-11-10 MED ORDER — CARVEDILOL 6.25 MG PO TABS
12.5000 mg | ORAL_TABLET | Freq: Once | ORAL | Status: AC
  Administered 2019-11-10: 12.5 mg via ORAL
  Filled 2019-11-10: qty 2

## 2019-11-10 MED ORDER — CIPROFLOXACIN HCL 500 MG PO TABS
500.0000 mg | ORAL_TABLET | Freq: Once | ORAL | Status: AC
  Administered 2019-11-10: 500 mg via ORAL
  Filled 2019-11-10: qty 1

## 2019-11-10 MED ORDER — HYDRALAZINE HCL 50 MG PO TABS
25.0000 mg | ORAL_TABLET | Freq: Once | ORAL | Status: AC
  Administered 2019-11-10: 25 mg via ORAL
  Filled 2019-11-10: qty 1

## 2019-11-10 MED ORDER — CIPROFLOXACIN HCL 500 MG PO TABS: 500.0000 mg | ORAL_TABLET | Freq: Every day | ORAL | 0 refills | Status: AC

## 2019-11-10 MED ORDER — LIDOCAINE 5 % EX PTCH: 1.0000 | MEDICATED_PATCH | Freq: Two times a day (BID) | CUTANEOUS | 0 refills | Status: AC

## 2019-11-10 MED ORDER — METHOCARBAMOL 500 MG PO TABS
500.0000 mg | ORAL_TABLET | Freq: Once | ORAL | Status: AC
  Administered 2019-11-10: 500 mg via ORAL
  Filled 2019-11-10: qty 1

## 2019-11-10 MED ORDER — AMLODIPINE BESYLATE 5 MG PO TABS
10.0000 mg | ORAL_TABLET | Freq: Once | ORAL | Status: AC
  Administered 2019-11-10: 10 mg via ORAL
  Filled 2019-11-10: qty 2

## 2019-11-10 MED ORDER — LIDOCAINE 5 % EX PTCH
1.0000 | MEDICATED_PATCH | CUTANEOUS | Status: DC
  Administered 2019-11-10: 1 via TRANSDERMAL
  Filled 2019-11-10: qty 1

## 2019-11-10 NOTE — ED Provider Notes (Signed)
[facility] Emergency Department Provider Note  ____________________________________________  Time seen: Approximately 1:15 PM  I have reviewed the triage vital signs and the nursing notes.   HISTORY  Chief Complaint Back Pain    HPI Daniel Thomas is a 64 y.o. malewith past medical history of COPD, diabetes, end-stage renal disease on dialysis, hypertension that presents to emergency department for evaluation of left lower back pain for 2 weeks. Patient was evaluated by his primary care on Wednesday and told that he has a muscle strain and started him on Mobic and tizanidine. The medication was helping at first but has not helped as much over the weekend. Patient denies any trauma. He has not taken his blood pressure medications this morning. Patient last went to dialysis yesterday.  He goes to dialysis on Tuesday, Thursday, Saturday.  He still makes a little urine. No bowel or bladder dysfunction or saddle anesthesias. No fever, nausea, vomiting, abdominal pain, numbness, tingling, weakness.   Past Medical History:  Diagnosis Date  . COPD (chronic obstructive pulmonary disease) ([place])   . Diabetes mellitus without complication ([place])   . Enlarged prostate   . ESRD on dialysis ([place])   . Heart murmur   . High cholesterol   . Hypertension     Patient Active Problem List   Diagnosis Date Noted  . Bacterial infection, unspecified 05/17/2019  . Bacterial intestinal infection, unspecified 05/17/2019  . Septicemia due to coagulase-negative staphylococcal infection ([place]) 05/14/2019  . Sepsis ([place]) 05/12/2019  . ESRD (end stage renal disease) ([place]) 05/12/2019  . Leucocytosis 05/12/2019  . Dialysis AV fistula malfunction ([place]) 04/17/2018  . Anemia, unspecified 01/26/2018  . Benign prostatic hyperplasia without lower urinary tract symptoms 01/16/2018  . Chronic obstructive pulmonary disease, unspecified ([place]) 01/16/2018  . Hyperkalemia 01/12/2018  . Malignant  hypertension 01/06/2018    Past Surgical History:  Procedure Laterality Date  . DIALYSIS/PERMA CATHETER INSERTION N/A 01/12/2018   Procedure: DIALYSIS/PERMA CATHETER INSERTION;  Surgeon: Katha Cabal, MD;  Location: [facility];  Service: Cardiovascular;  Laterality: N/A;  . DIALYSIS/PERMA CATHETER INSERTION N/A 05/16/2019   Procedure: DIALYSIS/PERMA CATHETER INSERTION;  Surgeon: Algernon Huxley, MD;  Location: [facility];  Service: Cardiovascular;  Laterality: N/A;  . DIALYSIS/PERMA CATHETER INSERTION N/A 10/17/2019   Procedure: DIALYSIS/PERMA CATHETER INSERTION;  Surgeon: Algernon Huxley, MD;  Location: [facility];  Service: Cardiovascular;  Laterality: N/A;  . DIALYSIS/PERMA CATHETER REMOVAL N/A 05/13/2019   Procedure: DIALYSIS/PERMA CATHETER REMOVAL;  Surgeon: Algernon Huxley, MD;  Location: [facility];  Service: Cardiovascular;  Laterality: N/A;  . TEE WITHOUT CARDIOVERSION N/A 05/15/2019   Procedure: TRANSESOPHAGEAL ECHOCARDIOGRAM (TEE);  Surgeon: Teodoro Spray, MD;  Location: [facility];  Service: Cardiovascular;  Laterality: N/A;    Prior to Admission medications   Medication Sig Start Date End Date Taking? Authorizing Provider  carvedilol (COREG) 12.5 MG tablet Take 12.5 mg by mouth 2 (two) times daily with a meal. 03/31/18  Yes [provider]  citalopram (CELEXA) 20 MG tablet Take 20 mg by mouth daily.   Yes [provider]  ergocalciferol (DRISDOL) 1.25 MG (50000 UT) capsule Take by mouth. Administered at analysis 02/02/18  Yes [provider]  hydrALAZINE (APRESOLINE) 25 MG tablet Take 1 tablet (25 mg total) by mouth every 8 (eight) hours. 05/17/19  Yes Nolberto Hanlon, MD  linagliptin (TRADJENTA) 5 MG TABS tablet Take 1 tablet (5 mg total) by mouth daily. 01/09/18  Yes  Loletha Grayer, MD  meloxicam (MOBIC) 7.5 MG tablet Take 7.5 mg by mouth daily. 11/06/19  Yes [provider]  Methoxy PEG-Epoetin Beta (MIRCERA IJ)  Mircera 01/08/19 03/03/20 Yes [provider]  tiZANidine (ZANAFLEX) 2 MG tablet Take 2 mg by mouth 3 (three) times daily as needed. 11/06/19  Yes [provider]  ciprofloxacin (CIPRO) 500 MG tablet Take 1 tablet (500 mg total) by mouth daily for 9 days. 11/11/19 11/20/19  Laban Emperor, PA-C  ferric citrate (AURYXIA) 1 GM 210 MG(Fe) tablet Take by mouth.  04/29/19   [provider]  lidocaine (LIDODERM) 5 % Place 1 patch onto the skin every 12 (twelve) hours. Remove & Discard patch within 12 hours or as directed by MD 11/10/19 11/09/20  Laban Emperor, PA-C    Allergies Patient has no known allergies.  No family history on file.  Social History Social History   Tobacco Use  . Smoking status: Former Smoker    Years: 20.00  . Smokeless tobacco: Never Used  Vaping Use  . Vaping Use: Never used  Substance Use Topics  . Alcohol use: Never  . Drug use: Never     Review of Systems  Constitutional: No fever/chills ENT: No upper respiratory complaints. Cardiovascular: No chest pain. Respiratory: No cough. No SOB. Gastrointestinal: No abdominal pain.  No nausea, no vomiting.  Genitourinary: Negative for dysuria. Musculoskeletal: Positive for low back pain. Skin: Negative for rash, abrasions, lacerations, ecchymosis. Neurological: Negative for headaches, numbness or tingling   ____________________________________________   PHYSICAL EXAM:  VITAL SIGNS: ED Triage Vitals  Enc Vitals Group     BP 11/10/19 1138 (!) 203/87     Pulse Rate 11/10/19 1138 82     Resp 11/10/19 1138 18     Temp 11/10/19 1138 99 F (37.2 C)     Temp Source 11/10/19 1138 Oral     SpO2 --      Weight 11/10/19 1137 165 lb (74.8 kg)     Height 11/10/19 1137 5\' 1"  (1.549 m)     Head Circumference --      Peak Flow --      Pain Score 11/10/19 1136 8     Pain Loc --      Pain Edu? --      Excl. in [place]? --      Constitutional: Alert and oriented. Well appearing and in no acute  distress. Eyes: Conjunctivae are normal. PERRL. EOMI. Head: Atraumatic. ENT:      Ears:      Nose: No congestion/rhinnorhea.      Mouth/Throat: Mucous membranes are moist.  Neck: No stridor. No cervical spine tenderness to palpation. Cardiovascular: Normal rate, regular rhythm.  Good peripheral circulation. Respiratory: Normal respiratory effort without tachypnea or retractions. Lungs CTAB. Good air entry to the bases with no decreased or absent breath sounds. Gastrointestinal: Bowel sounds 4 quadrants. Soft and nontender to palpation. No guarding or rigidity. No palpable masses. No distention. No CVA tenderness. Musculoskeletal: Full range of motion to all extremities. No gross deformities appreciated. Tenderness to palpation to left lumbar paraspinal muscles.  Pain elicited with rotation of spine no tenderness to palpation to lumbar spine. Strength equal in lower extremities bilaterally. Normal gait. Neurologic:  Normal speech and language. No gross focal neurologic deficits are appreciated.  Skin:  Skin is warm, dry and intact. No rash noted. Psychiatric: Mood and affect are normal. Speech and behavior are normal. Patient exhibits appropriate insight and judgement.   ____________________________________________  LABS (all labs ordered are listed, but only abnormal results are displayed)  Labs Reviewed  URINALYSIS, COMPLETE (UACMP) WITH MICROSCOPIC - Abnormal; Notable for the following components:      Result Value   Color, Urine YELLOW (*)    APPearance CLOUDY (*)    Hgb urine dipstick MODERATE (*)    Protein, ur 100 (*)    Leukocytes,Ua LARGE (*)    WBC, UA >50 (*)    Bacteria, UA RARE (*)    All other components within normal limits  CBC - Abnormal; Notable for the following components:   RBC 3.72 (*)    Hemoglobin 11.0 (*)    HCT 33.6 (*)    All other components within normal limits  COMPREHENSIVE METABOLIC PANEL - Abnormal; Notable for the following components:    Chloride 95 (*)    Glucose, Bld 105 (*)    BUN 33 (*)    Creatinine, Ser 7.88 (*)    Total Protein 8.5 (*)    GFR, Estimated 7 (*)    All other components within normal limits  TROPONIN I (HIGH SENSITIVITY) - Abnormal; Notable for the following components:   Troponin I (High Sensitivity) 29 (*)    All other components within normal limits  TROPONIN I (HIGH SENSITIVITY) - Abnormal; Notable for the following components:   Troponin I (High Sensitivity) 33 (*)    All other components within normal limits  URINE CULTURE   ____________________________________________  EKG  SR ____________________________________________  RADIOLOGY Robinette Haines, personally viewed and evaluated these images (plain radiographs) as part of my medical decision making, as well as reviewing the written report by the radiologist.  DG Lumbar Spine 2-3 Views  Result Date: 11/10/2019 CLINICAL DATA:  Lower back pain across to sacrum area for over 1 month, no known injury EXAM: LUMBAR SPINE - 2-3 VIEW COMPARISON:  None available; correlation is made with the report from a prior study of 07/06/2015 FINDINGS: Osseous demineralization. Five non-rib bearing lumbar vertebrae. Superior endplate compression fracture of L1 vertebral body with 50% anterior height loss, age indeterminate in appearance; an L1 compression fracture bone of unspecified degree is reported on the prior exam from 2017. Remaining lumbar vertebral body heights maintained. No additional fracture, subluxation, or bone destruction. SI joints preserved. Atherosclerotic calcifications aorta and iliac arteries. IMPRESSION: Age-indeterminate superior endplate compression fracture of L1 vertebral body. An L1 compression fracture of unspecified degree is reported on a prior exam from 2017, but the images are not available for direct comparison to know if this is stable or progressive. Electronically Signed   By: Lavonia Dana M.D.   On: 11/10/2019 13:38   CT  L-SPINE NO CHARGE  Result Date: 11/10/2019 CLINICAL DATA:  Low back pain. EXAM: CT LUMBAR SPINE WITHOUT CONTRAST TECHNIQUE: Multidetector CT imaging of the lumbar spine was performed without intravenous contrast administration. Multiplanar CT image reconstructions were also generated. COMPARISON:  Lumbar radiographs 11/10/2019 FINDINGS: Segmentation: Normal Alignment: Normal Vertebrae: Moderate compression fracture L1 vertebral body with approximately 50% loss of vertebral body height. Prominent anterior osteophytes consistent with chronic fracture. No acute fracture line. This fracture was described on there port from July 06, 2015. No bony retropulsion or spinal stenosis. No other fracture or mass lesion identified. Paraspinal and other soft tissues: Mild atherosclerotic disease in the aorta and iliacs without aneurysm. 3 mm right lower pole renal calculus without hydronephrosis. Small Bochdalek's hernia on the left. Disc levels: T12-L1: Mild disc degeneration and mild facet degeneration. Negative for stenosis  L1-2: Mild disc degeneration and spurring on the left. No significant stenosis L2-3: Mild disc bulging and mild facet degeneration. Negative for stenosis L3-4: Mild disc bulging and mild facet degeneration. Negative for stenosis. L4-5: Diffuse disc bulging and bilateral facet degeneration. Negative for stenosis L5-S1: Mild disc bulging. On the right bilateral facet degeneration, asymmetric. This is causing moderate to severe subarticular and foraminal stenosis on the right due to spurring. IMPRESSION: Moderate compression fracture L1, chronic by report in 2017. Asymmetric facet degeneration on the right L5-S1 causing severe subarticular and foraminal stenosis on the right. Electronically Signed   By: Franchot Gallo M.D.   On: 11/10/2019 16:08   CT Renal Stone Study  Result Date: 11/10/2019 CLINICAL DATA:  Lower back pain, flank pain EXAM: CT ABDOMEN AND PELVIS WITHOUT CONTRAST TECHNIQUE: Multidetector  CT imaging of the abdomen and pelvis was performed following the standard protocol without IV contrast. COMPARISON:  11/10/2019 FINDINGS: Lower chest: No acute pleural or parenchymal lung disease. Hepatobiliary: Calcified gallstone is identified within the gallbladder fundus. No evidence of acute cholecystitis. The liver is unremarkable. Pancreas: Unremarkable. No pancreatic ductal dilatation or surrounding inflammatory changes. Spleen: Normal in size without focal abnormality. Adrenals/Urinary Tract: Calcifications at the bilateral renal hila are felt to be vascular. No definite urinary tract calculi or obstructive uropathy within either kidney. The adrenals and bladder are grossly normal. Stomach/Bowel: No bowel obstruction or ileus. Normal appendix right lower quadrant. No bowel wall thickening or inflammatory change. Vascular/Lymphatic: Aortic atherosclerosis. No enlarged abdominal or pelvic lymph nodes. Reproductive: Prostate is enlarged measuring 4.8 x 5.5 cm. Other: No free fluid or free gas. Bilateral fat containing inguinal hernias, left greater than right. Musculoskeletal: No acute or destructive bony lesions. Chronic L1 compression deformity. Reconstructed images demonstrate no additional findings. IMPRESSION: 1. Small calcifications of the bilateral renal hila are likely vascular given the degree of atherosclerosis within the aorta and its branches. No evidence of urinary tract calculi or obstruction. 2. Cholelithiasis without cholecystitis. 3. Bilateral fat containing inguinal hernias.  No bowel herniation. 4. Enlarged prostate. 5.  Aortic Atherosclerosis (ICD10-I70.0). Electronically Signed   By: Randa Ngo M.D.   On: 11/10/2019 16:22    ____________________________________________    PROCEDURES  Procedure(s) performed:    Procedures    Medications  lidocaine (LIDODERM) 5 % 1 patch (1 patch Transdermal Patch Applied 11/10/19 1346)  methocarbamol (ROBAXIN) tablet 500 mg (500 mg Oral  Given 11/10/19 1345)  amLODipine (NORVASC) tablet 10 mg (10 mg Oral Given 11/10/19 1347)  hydrALAZINE (APRESOLINE) tablet 25 mg (25 mg Oral Given 11/10/19 1549)  carvedilol (COREG) tablet 12.5 mg (12.5 mg Oral Given 11/10/19 1548)  ciprofloxacin (CIPRO) tablet 500 mg (500 mg Oral Given 11/10/19 1823)     ____________________________________________   INITIAL IMPRESSION / ASSESSMENT AND PLAN / ED COURSE  Pertinent labs & imaging results that were available during my care of the patient were reviewed by me and considered in my medical decision making (see chart for details).  Review of the [place] CSRS was performed in accordance of the [place] prior to dispensing any controlled drugs.   Patient presented to emergency department for evaluation of left low back pain for 2 weeks.  Pain is reproducible and palpation and movement and likely musculoskeletal in nature.  Patient was given a dose of Robaxin for possible muscle strain.  X-ray and urinalysis was ordered.  ----------------------------------------- 2:00 PM on 11/10/2019 -----------------------------------------  Blood pressure recheck 252/112.  Patient states that no one is at home  to check his medications.  It does not appear on chart review that blood pressure medications have been confirmed as current this year.  MD unavailable for consult at this moment.  Lab work and EKG ordered.  Previous reported blood pressure medications in chart are amlodipine, hydralazine, carvedilol.  Amlodipine was ordered.  ----------------------------------------- 2:27 PM on 11/10/2019 -----------------------------------------  Lumbar x-ray shows a compression fracture at L1, which was previously reported in 2017.  Patient states that he had a compression fracture following an injury at [facility] 7 years ago and had to see orthopedics for several years.  He denies any new trauma.  Patient has no pinpoint tenderness to palpation over lumbar spine.  Patient states  that muscle relaxer and Lidoderm patch significantly improved his pain.  He denies any pain currently. Back discomfort has resolved.  ----------------------------------------- 2:30 PM on 11/10/2019 -----------------------------------------  Dr. Tamala Julian was consulted regarding patients blood pressure and recommends giving additional previous reported prescriptions of carvedilol and hydralazine.  He also recommends an additional dose of clonidine if no blood pressure improvement in 30 minutes.    Pharmacist states that she is only able to confirm recent prescriptions of hydralazine and carvedilol at this time.  ----------------------------------------- 4:31 PM on 11/10/2019 -----------------------------------------  CT renal stone and CT lumbar spine revealed no acute abnormalities. Patient continues to deny any further pain.  He is asymptomatic currently.  He is up walking around the room.  His brother has arrived and he is conversing with him.  He will attempt to give [place] a urine sample again.   ----------------------------------------- 6:00 PM on 11/10/2019 -----------------------------------------   Repeat blood pressure 171/78. Patient's repeat troponin 33, similar to his initial and lower than previous levels 4 months ago.  Patient denies any headache, shortness of breath, chest pain.  Blood pressure and lab work, including serial troponin results were discussed with Dr. Cheri Fowler, who is reassured by these values and does not recommend any further intervention in the emergency department.    Urinalysis does show hemoglobin, red blood cells, white blood cells, leukocytes.  Urinary tract infection may be contributing to patient's back pain.  He was given a dose of ciprofloxacin for UTI.  Patient continues to deny any symptoms or pain.    ----------------------------------------- 6:15 PM on 11/10/2019 -----------------------------------------  Patient and brother are in agreement with plan  for antibiotic treatment for urinary tract infection and outpatient follow-up with primary care.  Urine was sent for culture.   Patient will be discharged home with prescriptions for ciprofloxacin. Patient is to follow up with primary care as directed. Patient is given ED precautions to return to the ED for any worsening or new symptoms.   Daniel Thomas was evaluated in Emergency Department on 11/10/2019 for the symptoms described in the history of present illness. He was evaluated in the context of the global COVID-19 pandemic, which necessitated consideration that the patient might be at risk for infection with the SARS-CoV-2 virus that causes COVID-19. Institutional protocols and algorithms that pertain to the evaluation of patients at risk for COVID-19 are in a state of rapid change based on information released by regulatory bodies including the CDC and federal and state organizations. These policies and algorithms were followed during the patient's care in the ED.  ____________________________________________  FINAL CLINICAL IMPRESSION(S) / ED DIAGNOSES  Final diagnoses:  Compression fracture of L1 lumbar vertebra (HCC)  Acute cystitis with hematuria  Acute left-sided low back pain without sciatica      NEW MEDICATIONS  STARTED DURING THIS VISIT:  ED Discharge Orders         Ordered    ciprofloxacin (CIPRO) 500 MG tablet  Daily        11/10/19 1811    lidocaine (LIDODERM) 5 %  Every 12 hours        11/10/19 1812              This chart was dictated using voice recognition software/Dragon. Despite best efforts to proofread, errors can occur which can change the meaning. Any change was purely unintentional.    Laban Emperor, PA-C 11/10/19 1837    Naaman Plummer, MD 11/10/19 2317

## 2019-11-10 NOTE — ED Triage Notes (Signed)
Of note patient states he has not taken his blood pressure medicine this morning.

## 2019-11-10 NOTE — Discharge Instructions (Signed)
Your urine shows that you have an infection. Please begin the ciprofloxacin tomorrow for infection. Please do not take anymore of the tizanidine/zanafle, as it interacts with the antibiotic.  Your blood pressure was very high in the ED. Please make sure you are taking your blood pressure medicine everyday.

## 2019-11-10 NOTE — ED Triage Notes (Signed)
Patient to ED via EMS for lower back pain. States he has been taking meloxicam but it's not helping anymore.

## 2019-11-10 NOTE — ED Notes (Signed)
Pt states he has chronic back pain and has been doing good for a while but woke up this AM and states his back "was killing him"

## 2019-11-12 ENCOUNTER — Other Ambulatory Visit (INDEPENDENT_AMBULATORY_CARE_PROVIDER_SITE_OTHER): Payer: Self-pay | Admitting: Nurse Practitioner

## 2019-11-12 DIAGNOSIS — N186 End stage renal disease: Secondary | ICD-10-CM

## 2019-11-12 LAB — URINE CULTURE: Culture: <10000 — AB

## 2019-11-14 ENCOUNTER — Encounter (INDEPENDENT_AMBULATORY_CARE_PROVIDER_SITE_OTHER): Payer: Self-pay

## 2019-11-14 ENCOUNTER — Other Ambulatory Visit (INDEPENDENT_AMBULATORY_CARE_PROVIDER_SITE_OTHER): Payer: Self-pay

## 2019-11-14 ENCOUNTER — Ambulatory Visit (INDEPENDENT_AMBULATORY_CARE_PROVIDER_SITE_OTHER): Payer: Self-pay | Admitting: Nurse Practitioner

## 2019-11-27 ENCOUNTER — Other Ambulatory Visit (INDEPENDENT_AMBULATORY_CARE_PROVIDER_SITE_OTHER): Payer: Self-pay

## 2019-11-27 ENCOUNTER — Ambulatory Visit (INDEPENDENT_AMBULATORY_CARE_PROVIDER_SITE_OTHER): Payer: Self-pay | Admitting: Nurse Practitioner

## 2019-11-27 ENCOUNTER — Encounter (INDEPENDENT_AMBULATORY_CARE_PROVIDER_SITE_OTHER): Payer: Self-pay

## 2019-11-30 ENCOUNTER — Encounter: Payer: Self-pay | Admitting: Emergency Medicine

## 2019-11-30 ENCOUNTER — Emergency Department
Admission: EM | Admit: 2019-11-30 | Discharge: 2019-11-30 | Disposition: A | Discharge status: Home / Self Care | Payer: Medicare Other | Attending: Emergency Medicine | Admitting: Emergency Medicine

## 2019-11-30 ENCOUNTER — Other Ambulatory Visit: Payer: Self-pay

## 2019-11-30 DIAGNOSIS — E119 Type 2 diabetes mellitus without complications: Secondary | ICD-10-CM

## 2019-11-30 DIAGNOSIS — I120 Hypertensive chronic kidney disease with stage 5 chronic kidney disease or end stage renal disease: Secondary | ICD-10-CM | POA: Insufficient documentation

## 2019-11-30 DIAGNOSIS — R55 Syncope and collapse: Secondary | ICD-10-CM | POA: Diagnosis not present

## 2019-11-30 DIAGNOSIS — N186 End stage renal disease: Secondary | ICD-10-CM | POA: Insufficient documentation

## 2019-11-30 DIAGNOSIS — Z992 Dependence on renal dialysis: Secondary | ICD-10-CM | POA: Insufficient documentation

## 2019-11-30 DIAGNOSIS — J449 Chronic obstructive pulmonary disease, unspecified: Secondary | ICD-10-CM | POA: Diagnosis not present

## 2019-11-30 DIAGNOSIS — Z87891 Personal history of nicotine dependence: Secondary | ICD-10-CM | POA: Insufficient documentation

## 2019-11-30 DIAGNOSIS — E1122 Type 2 diabetes mellitus with diabetic chronic kidney disease: Secondary | ICD-10-CM | POA: Insufficient documentation

## 2019-11-30 LAB — CBC
HCT: 29 % — ABNORMAL LOW (ref 39.0–52.0)
Hemoglobin: 9.7 g/dL — ABNORMAL LOW (ref 13.0–17.0)
MCH: 30.2 pg (ref 26.0–34.0)
MCHC: 33.4 g/dL (ref 30.0–36.0)
MCV: 90.3 fL (ref 80.0–100.0)
Platelets: 362 10*3/uL (ref 150–400)
RBC: 3.21 MIL/uL — ABNORMAL LOW (ref 4.22–5.81)
RDW: 14.8 % (ref 11.5–15.5)
WBC: 8.6 10*3/uL (ref 4.0–10.5)
nRBC: 0 % (ref 0.0–0.2)

## 2019-11-30 LAB — BASIC METABOLIC PANEL
Anion gap: 14 (ref 5–15)
BUN: 47 mg/dL — ABNORMAL HIGH (ref 8–23)
CO2: 24 mmol/L (ref 22–32)
Calcium: 8.8 mg/dL — ABNORMAL LOW (ref 8.9–10.3)
Chloride: 96 mmol/L — ABNORMAL LOW (ref 98–111)
Creatinine, Ser: 7.37 mg/dL — ABNORMAL HIGH (ref 0.61–1.24)
GFR, Estimated: 8 mL/min — ABNORMAL LOW (ref >60)
Glucose, Bld: 142 mg/dL — ABNORMAL HIGH (ref 70–99)
Potassium: 3.9 mmol/L (ref 3.5–5.1)
Sodium: 134 mmol/L — ABNORMAL LOW (ref 135–145)

## 2019-11-30 NOTE — Discharge Instructions (Signed)
Call your dialysis center as soon as possible for a make up session since you were unable to complete today's session.  Your EKG and lab tests today all looked fine.

## 2019-11-30 NOTE — ED Triage Notes (Signed)
Pt in via EMS from dialysis with c/o not feeling well. EMS reports pt was getting his treatment and got choked on his spit and began vomiting. EMS reports then pt reported he was not feeling well overall but could not pin point why. FSBS 167, BP 187/74, HR 82

## 2019-11-30 NOTE — ED Provider Notes (Signed)
[facility] Emergency Department Provider Note  ____________________________________________  Time seen: Approximately 11:52 AM  I have reviewed the triage vital signs and the nursing notes.   HISTORY  Chief Complaint Loss of Consciousness    HPI Daniel Thomas is a 64 y.o. male with a history of COPD diabetes end-stage renal disease on hemodialysis who comes the ED complaining of a syncope episode while having dialysis today.  Patient reports that he was feeling totally fine, and then felt like he gagged and started to choke on his spit after which, he passed out.  He denies any preceding pain or other acute complaints.  No paresthesias or weakness, no headache or vision changes.  Afterward he feels totally fine.  Currently he feels normal.  This happened relatively early in his dialysis session and he was sent here before it could be completed.  Denies any other acute symptoms recently, no acute illnesses.      Past Medical History:  Diagnosis Date  . COPD (chronic obstructive pulmonary disease) ([place])   . Diabetes mellitus without complication ([place])   . Enlarged prostate   . ESRD on dialysis ([place])   . Heart murmur   . High cholesterol   . Hypertension      Patient Active Problem List   Diagnosis Date Noted  . Bacterial infection, unspecified 05/17/2019  . Bacterial intestinal infection, unspecified 05/17/2019  . Septicemia due to coagulase-negative staphylococcal infection ([place]) 05/14/2019  . Sepsis ([place]) 05/12/2019  . ESRD (end stage renal disease) ([place]) 05/12/2019  . Leucocytosis 05/12/2019  . Dialysis AV fistula malfunction ([place]) 04/17/2018  . Anemia, unspecified 01/26/2018  . Benign prostatic hyperplasia without lower urinary tract symptoms 01/16/2018  . Chronic obstructive pulmonary disease, unspecified ([place]) 01/16/2018  . Hyperkalemia 01/12/2018  . Malignant hypertension 01/06/2018     Past Surgical History:  Procedure Laterality  Date  . DIALYSIS/PERMA CATHETER INSERTION N/A 01/12/2018   Procedure: DIALYSIS/PERMA CATHETER INSERTION;  Surgeon: Katha Cabal, MD;  Location: [place] [facility];  Service: Cardiovascular;  Laterality: N/A;  . DIALYSIS/PERMA CATHETER INSERTION N/A 05/16/2019   Procedure: DIALYSIS/PERMA CATHETER INSERTION;  Surgeon: Algernon Huxley, MD;  Location: [place] [facility];  Service: Cardiovascular;  Laterality: N/A;  . DIALYSIS/PERMA CATHETER INSERTION N/A 10/17/2019   Procedure: DIALYSIS/PERMA CATHETER INSERTION;  Surgeon: Algernon Huxley, MD;  Location: [place] [facility];  Service: Cardiovascular;  Laterality: N/A;  . DIALYSIS/PERMA CATHETER REMOVAL N/A 05/13/2019   Procedure: DIALYSIS/PERMA CATHETER REMOVAL;  Surgeon: Algernon Huxley, MD;  Location: [place] [facility];  Service: Cardiovascular;  Laterality: N/A;  . TEE WITHOUT CARDIOVERSION N/A 05/15/2019   Procedure: TRANSESOPHAGEAL ECHOCARDIOGRAM (TEE);  Surgeon: Teodoro Spray, MD;  Location: [facility];  Service: Cardiovascular;  Laterality: N/A;     Prior to Admission medications   Medication Sig Start Date End Date Taking? Authorizing Provider  carvedilol (COREG) 12.5 MG tablet Take 12.5 mg by mouth 2 (two) times daily with a meal. 03/31/18   [provider]  citalopram (CELEXA) 20 MG tablet Take 20 mg by mouth daily.    [provider]  ergocalciferol (DRISDOL) 1.25 MG (50000 UT) capsule Take by mouth. Administered at analysis 02/02/18   [provider]  ferric citrate (AURYXIA) 1 GM 210 MG(Fe) tablet Take by mouth.  04/29/19   [provider]  hydrALAZINE (APRESOLINE) 25 MG tablet Take 1 tablet (25 mg total) by mouth every 8 (eight) hours. 05/17/19   Nolberto Hanlon, MD  lidocaine (LIDODERM) 5 % Place 1 patch onto the skin every 12 (twelve) hours. Remove & Discard patch within 12 hours or as directed by MD 11/10/19 11/09/20  Laban Emperor, PA-C  linagliptin (TRADJENTA) 5 MG TABS tablet Take 1 tablet (5 mg  total) by mouth daily. 01/09/18   Loletha Grayer, MD  meloxicam (MOBIC) 7.5 MG tablet Take 7.5 mg by mouth daily. 11/06/19   [provider]  Methoxy PEG-Epoetin Beta (MIRCERA IJ) Mircera 01/08/19 03/03/20  [provider]  tiZANidine (ZANAFLEX) 2 MG tablet Take 2 mg by mouth 3 (three) times daily as needed. 11/06/19   [provider]     Allergies Patient has no known allergies.   History reviewed. No pertinent family history.  Social History Social History   Tobacco Use  . Smoking status: Former Smoker    Years: 20.00  . Smokeless tobacco: Never Used  Vaping Use  . Vaping Use: Never used  Substance Use Topics  . Alcohol use: Never  . Drug use: Never    Review of Systems  Constitutional:   No fever or chills.  ENT:   No sore throat. No rhinorrhea. Cardiovascular:   No chest pain, positive syncope. Respiratory:   No dyspnea or cough. Gastrointestinal:   Negative for abdominal pain, vomiting and diarrhea.  Musculoskeletal:   Negative for focal pain or swelling All other systems reviewed and are negative except as documented above in ROS and HPI.  ____________________________________________   PHYSICAL EXAM:  VITAL SIGNS: ED Triage Vitals  Enc Vitals Group     BP 11/30/19 0943 127/78     Pulse Rate 11/30/19 0943 76     Resp 11/30/19 0943 18     Temp 11/30/19 0943 97.7 F (36.5 C)     Temp Source 11/30/19 0943 Oral     SpO2 11/30/19 0943 98 %     Weight 11/30/19 0945 164 lb 15.9 oz (74.8 kg)     Height 11/30/19 0945 5\' 1"  (1.549 m)     Head Circumference --      Peak Flow --      Pain Score 11/30/19 0945 5     Pain Loc --      Pain Edu? --      Excl. in [place]? --     Vital signs reviewed, nursing assessments reviewed.   Constitutional:   Alert and oriented. Non-toxic appearance. Eyes:   Conjunctivae are normal. EOMI. PERRL. ENT      Head:   Normocephalic and atraumatic.      Nose:   Wearing a mask.      Mouth/Throat:   Wearing a  mask.      Neck:   No meningismus. Full ROM. Hematological/Lymphatic/Immunilogical:   No cervical lymphadenopathy. Cardiovascular:   RRR. Symmetric bilateral radial and DP pulses.  No murmurs. Cap refill less than 2 seconds.  Tunneled left IJ dialysis catheter in place, skin exit site clean and noninflamed Respiratory:   Normal respiratory effort without tachypnea/retractions. Breath sounds are clear and equal bilaterally. No wheezes/rales/rhonchi. Gastrointestinal:   Soft and nontender. Non distended. There is no CVA tenderness.  No rebound, rigidity, or guarding.  Musculoskeletal:   Normal range of motion in all extremities. No joint effusions.  No lower extremity tenderness.  No edema. Neurologic:   Normal speech and language.  Motor grossly intact. No acute focal neurologic deficits are appreciated.  Skin:    Skin is warm, dry and intact. No rash noted.  No petechiae, purpura,  or bullae.  ____________________________________________    LABS (pertinent positives/negatives) (all labs ordered are listed, but only abnormal results are displayed) Labs Reviewed  BASIC METABOLIC PANEL - Abnormal; Notable for the following components:      Result Value   Sodium 134 (*)    Chloride 96 (*)    Glucose, Bld 142 (*)    BUN 47 (*)    Creatinine, Ser 7.37 (*)    Calcium 8.8 (*)    GFR, Estimated 8 (*)    All other components within normal limits  CBC - Abnormal; Notable for the following components:   RBC 3.21 (*)    Hemoglobin 9.7 (*)    HCT 29.0 (*)    All other components within normal limits  URINALYSIS, COMPLETE (UACMP) WITH MICROSCOPIC  CBG MONITORING, ED   ____________________________________________   EKG  Interpreted by me Normal sinus rhythm rate of 74, normal axis, very slight prolongation of QTC at 515 ms.  Normal QRS ST segments and T waves.  No evidence of underlying dysrhythmia.  ____________________________________________    RADIOLOGY  No results  found.  ____________________________________________   PROCEDURES Procedures  ____________________________________________  DIFFERENTIAL DIAGNOSIS   Vagal episode, transient hypotension related to dialysis/disequilibrium syndrome, dehydration  CLINICAL IMPRESSION / ASSESSMENT AND PLAN / ED COURSE  Medications ordered in the ED: Medications - No data to display  Pertinent labs & imaging results that were available during my care of the patient were reviewed by me and considered in my medical decision making (see chart for details).  LOUDON KRAKOW was evaluated in Emergency Department on 11/30/2019 for the symptoms described in the history of present illness. He was evaluated in the context of the global COVID-19 pandemic, which necessitated consideration that the patient might be at risk for infection with the SARS-CoV-2 virus that causes COVID-19. Institutional protocols and algorithms that pertain to the evaluation of patients at risk for COVID-19 are in a state of rapid change based on information released by regulatory bodies including the CDC and federal and state organizations. These policies and algorithms were followed during the patient's care in the ED.   Patient presents with syncope from dialysis shortly after initiating session.  No worrisome preceding symptoms or new symptoms afterward.  Currently feels normal.  Vital signs are normal.  Labs unremarkable, exam reassuring.  Stable for discharge and continued outpatient follow-up.  He will call his dialysis center for make-up session.  Doubt ACS PE dissection pericarditis dysrhythmia.      ____________________________________________   FINAL CLINICAL IMPRESSION(S) / ED DIAGNOSES    Final diagnoses:  Syncope, unspecified syncope type  ESRD on hemodialysis ([place])  Type 2 diabetes mellitus without complication, without long-term current use of insulin [facility])     ED Discharge Orders    None      Portions of this  note were generated with dragon dictation software. Dictation errors may occur despite best attempts at proofreading.   Carrie Mew, MD 11/30/19 1200

## 2019-11-30 NOTE — ED Triage Notes (Signed)
Pt also reports "blacking out" at dialysis today.  Reports to this RN "I got sick to my stomach but didn't throw nothing up".  Pt is poor historian and unable to give full account of reasons for visit today.

## 2019-11-30 NOTE — ED Triage Notes (Signed)
Pt did not finish complete treatment and still had approximately 2.5 hours left.

## 2019-12-05 ENCOUNTER — Inpatient Hospital Stay: Payer: Medicare Other

## 2019-12-05 ENCOUNTER — Other Ambulatory Visit: Payer: Self-pay

## 2019-12-05 ENCOUNTER — Emergency Department: Payer: Medicare Other

## 2019-12-05 ENCOUNTER — Encounter: Payer: Self-pay | Admitting: Emergency Medicine

## 2019-12-05 ENCOUNTER — Inpatient Hospital Stay
Admission: EM | Admit: 2019-12-05 | Discharge: 2019-12-11 | DRG: 314 | Disposition: A | Discharge status: Home / Self Care | Payer: Medicare Other | Attending: Internal Medicine | Admitting: Internal Medicine

## 2019-12-05 DIAGNOSIS — N186 End stage renal disease: Secondary | ICD-10-CM | POA: Diagnosis present

## 2019-12-05 DIAGNOSIS — R6520 Severe sepsis without septic shock: Secondary | ICD-10-CM | POA: Diagnosis present

## 2019-12-05 DIAGNOSIS — J9602 Acute respiratory failure with hypercapnia: Secondary | ICD-10-CM

## 2019-12-05 DIAGNOSIS — A4102 Sepsis due to Methicillin resistant Staphylococcus aureus: Secondary | ICD-10-CM | POA: Diagnosis present

## 2019-12-05 DIAGNOSIS — N185 Chronic kidney disease, stage 5: Secondary | ICD-10-CM | POA: Diagnosis not present

## 2019-12-05 DIAGNOSIS — E7800 Pure hypercholesterolemia, unspecified: Secondary | ICD-10-CM | POA: Diagnosis present

## 2019-12-05 DIAGNOSIS — E785 Hyperlipidemia, unspecified: Secondary | ICD-10-CM | POA: Diagnosis present

## 2019-12-05 DIAGNOSIS — Z8546 Personal history of malignant neoplasm of prostate: Secondary | ICD-10-CM

## 2019-12-05 DIAGNOSIS — Z8614 Personal history of Methicillin resistant Staphylococcus aureus infection: Secondary | ICD-10-CM

## 2019-12-05 DIAGNOSIS — I161 Hypertensive emergency: Secondary | ICD-10-CM | POA: Diagnosis present

## 2019-12-05 DIAGNOSIS — N400 Enlarged prostate without lower urinary tract symptoms: Secondary | ICD-10-CM | POA: Diagnosis present

## 2019-12-05 DIAGNOSIS — R6510 Systemic inflammatory response syndrome (SIRS) of non-infectious origin without acute organ dysfunction: Secondary | ICD-10-CM | POA: Diagnosis present

## 2019-12-05 DIAGNOSIS — E079 Disorder of thyroid, unspecified: Secondary | ICD-10-CM | POA: Insufficient documentation

## 2019-12-05 DIAGNOSIS — N2581 Secondary hyperparathyroidism of renal origin: Secondary | ICD-10-CM | POA: Diagnosis present

## 2019-12-05 DIAGNOSIS — T80211A Bloodstream infection due to central venous catheter, initial encounter: Principal | ICD-10-CM | POA: Diagnosis present

## 2019-12-05 DIAGNOSIS — R9721 Rising PSA following treatment for malignant neoplasm of prostate: Secondary | ICD-10-CM | POA: Diagnosis present

## 2019-12-05 DIAGNOSIS — A419 Sepsis, unspecified organism: Secondary | ICD-10-CM | POA: Diagnosis not present

## 2019-12-05 DIAGNOSIS — I493 Ventricular premature depolarization: Secondary | ICD-10-CM | POA: Diagnosis present

## 2019-12-05 DIAGNOSIS — J441 Chronic obstructive pulmonary disease with (acute) exacerbation: Secondary | ICD-10-CM | POA: Diagnosis present

## 2019-12-05 DIAGNOSIS — I5033 Acute on chronic diastolic (congestive) heart failure: Secondary | ICD-10-CM | POA: Diagnosis present

## 2019-12-05 DIAGNOSIS — I132 Hypertensive heart and chronic kidney disease with heart failure and with stage 5 chronic kidney disease, or end stage renal disease: Secondary | ICD-10-CM | POA: Diagnosis present

## 2019-12-05 DIAGNOSIS — B9562 Methicillin resistant Staphylococcus aureus infection as the cause of diseases classified elsewhere: Secondary | ICD-10-CM | POA: Diagnosis not present

## 2019-12-05 DIAGNOSIS — Z992 Dependence on renal dialysis: Secondary | ICD-10-CM

## 2019-12-05 DIAGNOSIS — Y828 Other medical devices associated with adverse incidents: Secondary | ICD-10-CM | POA: Diagnosis present

## 2019-12-05 DIAGNOSIS — J9600 Acute respiratory failure, unspecified whether with hypoxia or hypercapnia: Secondary | ICD-10-CM | POA: Diagnosis not present

## 2019-12-05 DIAGNOSIS — L2389 Allergic contact dermatitis due to other agents: Secondary | ICD-10-CM | POA: Diagnosis not present

## 2019-12-05 DIAGNOSIS — Z20822 Contact with and (suspected) exposure to covid-19: Secondary | ICD-10-CM | POA: Diagnosis present

## 2019-12-05 DIAGNOSIS — T827XXA Infection and inflammatory reaction due to other cardiac and vascular devices, implants and grafts, initial encounter: Secondary | ICD-10-CM | POA: Diagnosis not present

## 2019-12-05 DIAGNOSIS — E1122 Type 2 diabetes mellitus with diabetic chronic kidney disease: Secondary | ICD-10-CM | POA: Diagnosis present

## 2019-12-05 DIAGNOSIS — Z7984 Long term (current) use of oral hypoglycemic drugs: Secondary | ICD-10-CM

## 2019-12-05 DIAGNOSIS — F32A Depression, unspecified: Secondary | ICD-10-CM | POA: Diagnosis present

## 2019-12-05 DIAGNOSIS — E875 Hyperkalemia: Secondary | ICD-10-CM | POA: Diagnosis present

## 2019-12-05 DIAGNOSIS — D631 Anemia in chronic kidney disease: Secondary | ICD-10-CM | POA: Diagnosis present

## 2019-12-05 DIAGNOSIS — J9601 Acute respiratory failure with hypoxia: Secondary | ICD-10-CM | POA: Diagnosis present

## 2019-12-05 DIAGNOSIS — Z791 Long term (current) use of non-steroidal anti-inflammatories (NSAID): Secondary | ICD-10-CM

## 2019-12-05 DIAGNOSIS — Z8249 Family history of ischemic heart disease and other diseases of the circulatory system: Secondary | ICD-10-CM

## 2019-12-05 DIAGNOSIS — I10 Essential (primary) hypertension: Secondary | ICD-10-CM | POA: Diagnosis not present

## 2019-12-05 DIAGNOSIS — I509 Heart failure, unspecified: Secondary | ICD-10-CM | POA: Diagnosis not present

## 2019-12-05 DIAGNOSIS — Z825 Family history of asthma and other chronic lower respiratory diseases: Secondary | ICD-10-CM

## 2019-12-05 DIAGNOSIS — Z79899 Other long term (current) drug therapy: Secondary | ICD-10-CM

## 2019-12-05 DIAGNOSIS — Z833 Family history of diabetes mellitus: Secondary | ICD-10-CM

## 2019-12-05 DIAGNOSIS — R778 Other specified abnormalities of plasma proteins: Secondary | ICD-10-CM | POA: Diagnosis present

## 2019-12-05 DIAGNOSIS — Z87891 Personal history of nicotine dependence: Secondary | ICD-10-CM

## 2019-12-05 DIAGNOSIS — Z821 Family history of blindness and visual loss: Secondary | ICD-10-CM

## 2019-12-05 LAB — CBC WITH DIFFERENTIAL/PLATELET
Abs Immature Granulocytes: 0.07 10*3/uL (ref 0.00–0.07)
Basophils Absolute: 0.1 10*3/uL (ref 0.0–0.1)
Basophils Relative: 0 %
Eosinophils Absolute: 0.3 10*3/uL (ref 0.0–0.5)
Eosinophils Relative: 2 %
HCT: 29.5 % — ABNORMAL LOW (ref 39.0–52.0)
Hemoglobin: 9.8 g/dL — ABNORMAL LOW (ref 13.0–17.0)
Immature Granulocytes: 0 %
Lymphocytes Relative: 8 %
Lymphs Abs: 1.3 10*3/uL (ref 0.7–4.0)
MCH: 31 pg (ref 26.0–34.0)
MCHC: 33.2 g/dL (ref 30.0–36.0)
MCV: 93.4 fL (ref 80.0–100.0)
Monocytes Absolute: 1.4 10*3/uL — ABNORMAL HIGH (ref 0.1–1.0)
Monocytes Relative: 8 %
Neutro Abs: 13.3 10*3/uL — ABNORMAL HIGH (ref 1.7–7.7)
Neutrophils Relative %: 82 %
Platelets: 368 10*3/uL (ref 150–400)
RBC: 3.16 MIL/uL — ABNORMAL LOW (ref 4.22–5.81)
RDW: 15.7 % — ABNORMAL HIGH (ref 11.5–15.5)
WBC: 16.4 10*3/uL — ABNORMAL HIGH (ref 4.0–10.5)
nRBC: 0 % (ref 0.0–0.2)

## 2019-12-05 LAB — TROPONIN I (HIGH SENSITIVITY)
Troponin I (High Sensitivity): 42 ng/L — ABNORMAL HIGH (ref <18)
Troponin I (High Sensitivity): 44 ng/L — ABNORMAL HIGH (ref <18)

## 2019-12-05 LAB — COMPREHENSIVE METABOLIC PANEL
ALT: 26 U/L (ref 0–44)
AST: 26 U/L (ref 15–41)
Albumin: 3.9 g/dL (ref 3.5–5.0)
Alkaline Phosphatase: 105 U/L (ref 38–126)
Anion gap: 13 (ref 5–15)
BUN: 59 mg/dL — ABNORMAL HIGH (ref 8–23)
CO2: 21 mmol/L — ABNORMAL LOW (ref 22–32)
Calcium: 8.5 mg/dL — ABNORMAL LOW (ref 8.9–10.3)
Chloride: 101 mmol/L (ref 98–111)
Creatinine, Ser: 8.31 mg/dL — ABNORMAL HIGH (ref 0.61–1.24)
GFR, Estimated: 7 mL/min — ABNORMAL LOW (ref >60)
Glucose, Bld: 117 mg/dL — ABNORMAL HIGH (ref 70–99)
Potassium: 5.2 mmol/L — ABNORMAL HIGH (ref 3.5–5.1)
Sodium: 135 mmol/L (ref 135–145)
Total Bilirubin: 1.1 mg/dL (ref 0.3–1.2)
Total Protein: 7.9 g/dL (ref 6.5–8.1)

## 2019-12-05 LAB — RESP PANEL BY RT-PCR (FLU A&B, COVID) ARPGX2
Influenza A by PCR: NEGATIVE
Influenza B by PCR: NEGATIVE
SARS Coronavirus 2 by RT PCR: NEGATIVE

## 2019-12-05 LAB — MAGNESIUM: Magnesium: 2.2 mg/dL (ref 1.7–2.4)

## 2019-12-05 LAB — APTT: aPTT: 31 seconds (ref 24–36)

## 2019-12-05 LAB — BLOOD GAS, ARTERIAL
Acid-base deficit: 6.7 mmol/L — ABNORMAL HIGH (ref 0.0–2.0)
Bicarbonate: 19.2 mmol/L — ABNORMAL LOW (ref 20.0–28.0)
O2 Saturation: 74 %
Patient temperature: 37
pCO2 arterial: 39 mmHg (ref 32.0–48.0)
pH, Arterial: 7.3 — ABNORMAL LOW (ref 7.350–7.450)
pO2, Arterial: 44 mmHg — ABNORMAL LOW (ref 83.0–108.0)

## 2019-12-05 LAB — PROTIME-INR
INR: 1.1 (ref 0.8–1.2)
Prothrombin Time: 13.8 seconds (ref 11.4–15.2)

## 2019-12-05 LAB — BRAIN NATRIURETIC PEPTIDE: B Natriuretic Peptide: 902.6 pg/mL — ABNORMAL HIGH (ref 0.0–100.0)

## 2019-12-05 LAB — PHOSPHORUS: Phosphorus: 6.6 mg/dL — ABNORMAL HIGH (ref 2.5–4.6)

## 2019-12-05 MED ORDER — ACETAMINOPHEN 650 MG RE SUPP: 650.0000 mg | Freq: Four times a day (QID) | RECTAL | Status: DC | PRN

## 2019-12-05 MED ORDER — CITALOPRAM HYDROBROMIDE 20 MG PO TABS
20.0000 mg | ORAL_TABLET | Freq: Every day | ORAL | Status: DC
  Administered 2019-12-06 – 2019-12-11 (×6): 20 mg via ORAL
  Filled 2019-12-05 (×6): qty 1

## 2019-12-05 MED ORDER — CARVEDILOL 12.5 MG PO TABS
12.5000 mg | ORAL_TABLET | Freq: Two times a day (BID) | ORAL | Status: DC
  Administered 2019-12-05 – 2019-12-11 (×10): 12.5 mg via ORAL
  Filled 2019-12-05: qty 2
  Filled 2019-12-05: qty 1
  Filled 2019-12-05: qty 2
  Filled 2019-12-05 (×7): qty 1

## 2019-12-05 MED ORDER — LIDOCAINE 5 % EX PTCH
1.0000 | MEDICATED_PATCH | Freq: Two times a day (BID) | CUTANEOUS | Status: DC
  Administered 2019-12-06 – 2019-12-11 (×11): 1 via TRANSDERMAL
  Filled 2019-12-05 (×12): qty 1

## 2019-12-05 MED ORDER — METOPROLOL TARTRATE 5 MG/5ML IV SOLN: 5.0000 mg | INTRAVENOUS | Status: DC | PRN

## 2019-12-05 MED ORDER — SODIUM CHLORIDE 0.9% FLUSH: 3.0000 mL | INTRAVENOUS | Status: DC | PRN

## 2019-12-05 MED ORDER — AMLODIPINE BESYLATE 10 MG PO TABS: 10.0000 mg | ORAL_TABLET | Freq: Every day | ORAL | 0 refills | Status: AC

## 2019-12-05 MED ORDER — IPRATROPIUM-ALBUTEROL 0.5-2.5 (3) MG/3ML IN SOLN
3.0000 mL | Freq: Once | RESPIRATORY_TRACT | Status: AC
  Administered 2019-12-05: 3 mL via RESPIRATORY_TRACT
  Filled 2019-12-05: qty 3

## 2019-12-05 MED ORDER — PREDNISONE 20 MG PO TABS: 40.0000 mg | ORAL_TABLET | Freq: Every day | ORAL | Status: DC

## 2019-12-05 MED ORDER — HYDRALAZINE HCL 20 MG/ML IJ SOLN
INTRAMUSCULAR | Status: AC
  Filled 2019-12-05: qty 1

## 2019-12-05 MED ORDER — EPOETIN ALFA 4000 UNIT/ML IJ SOLN
4000.0000 [IU] | INTRAMUSCULAR | Status: DC
  Administered 2019-12-05 – 2019-12-10 (×2): 4000 [IU] via INTRAVENOUS
  Filled 2019-12-05 (×2): qty 1

## 2019-12-05 MED ORDER — NITROGLYCERIN 2 % TD OINT: 1.0000 [in_us] | TOPICAL_OINTMENT | Freq: Once | TRANSDERMAL | Status: DC

## 2019-12-05 MED ORDER — VITAMIN D (ERGOCALCIFEROL) 1.25 MG (50000 UNIT) PO CAPS
50000.0000 [IU] | ORAL_CAPSULE | ORAL | Status: DC
  Administered 2019-12-10: 50000 [IU] via ORAL
  Filled 2019-12-05: qty 1

## 2019-12-05 MED ORDER — TRAZODONE HCL 50 MG PO TABS: 25.0000 mg | ORAL_TABLET | Freq: Every evening | ORAL | Status: DC | PRN

## 2019-12-05 MED ORDER — NITROGLYCERIN IN D5W 200-5 MCG/ML-% IV SOLN
0.0000 ug/min | INTRAVENOUS | Status: DC
  Administered 2019-12-05: 50 ug/min via INTRAVENOUS
  Filled 2019-12-05: qty 250

## 2019-12-05 MED ORDER — SODIUM CHLORIDE 0.9% FLUSH
3.0000 mL | Freq: Two times a day (BID) | INTRAVENOUS | Status: DC
  Administered 2019-12-05 – 2019-12-11 (×10): 3 mL via INTRAVENOUS

## 2019-12-05 MED ORDER — VANCOMYCIN HCL IN DEXTROSE 1-5 GM/200ML-% IV SOLN
1000.0000 mg | Freq: Once | INTRAVENOUS | Status: AC
  Administered 2019-12-05: 1000 mg via INTRAVENOUS
  Filled 2019-12-05: qty 200

## 2019-12-05 MED ORDER — METHYLPREDNISOLONE SODIUM SUCC 40 MG IJ SOLR
40.0000 mg | Freq: Four times a day (QID) | INTRAMUSCULAR | Status: DC
  Administered 2019-12-05: 40 mg via INTRAVENOUS
  Filled 2019-12-05: qty 1

## 2019-12-05 MED ORDER — HYDRALAZINE HCL 20 MG/ML IJ SOLN
10.0000 mg | Freq: Four times a day (QID) | INTRAMUSCULAR | Status: DC | PRN
  Administered 2019-12-05 – 2019-12-09 (×3): 10 mg via INTRAVENOUS
  Filled 2019-12-05 (×2): qty 1

## 2019-12-05 MED ORDER — VANCOMYCIN HCL 1250 MG/250ML IV SOLN
1250.0000 mg | Freq: Once | INTRAVENOUS | Status: AC
  Administered 2019-12-05: 1250 mg via INTRAVENOUS
  Filled 2019-12-05: qty 250

## 2019-12-05 MED ORDER — LOSARTAN POTASSIUM 50 MG PO TABS: 50.0000 mg | ORAL_TABLET | Freq: Every day | ORAL | Status: DC

## 2019-12-05 MED ORDER — HYDRALAZINE HCL 50 MG PO TABS
25.0000 mg | ORAL_TABLET | Freq: Three times a day (TID) | ORAL | Status: DC
  Administered 2019-12-05: 25 mg via ORAL
  Filled 2019-12-05: qty 1

## 2019-12-05 MED ORDER — ACETAMINOPHEN 325 MG PO TABS
650.0000 mg | ORAL_TABLET | Freq: Four times a day (QID) | ORAL | Status: DC | PRN
  Administered 2019-12-05 – 2019-12-10 (×4): 650 mg via ORAL
  Filled 2019-12-05 (×3): qty 2

## 2019-12-05 MED ORDER — SODIUM CHLORIDE 0.9 % IV SOLN
1.0000 g | INTRAVENOUS | Status: DC
  Administered 2019-12-05 – 2019-12-08 (×4): 1 g via INTRAVENOUS
  Filled 2019-12-05: qty 1
  Filled 2019-12-05 (×2): qty 10
  Filled 2019-12-05: qty 1
  Filled 2019-12-05: qty 10

## 2019-12-05 MED ORDER — AMLODIPINE BESYLATE 5 MG PO TABS: 10.0000 mg | ORAL_TABLET | Freq: Every day | ORAL | Status: DC

## 2019-12-05 MED ORDER — FUROSEMIDE 10 MG/ML IJ SOLN
60.0000 mg | Freq: Two times a day (BID) | INTRAMUSCULAR | Status: DC
  Administered 2019-12-05: 60 mg via INTRAVENOUS
  Filled 2019-12-05: qty 8

## 2019-12-05 MED ORDER — IOHEXOL 350 MG/ML SOLN
75.0000 mL | Freq: Once | INTRAVENOUS | Status: AC | PRN
  Administered 2019-12-05: 75 mL via INTRAVENOUS

## 2019-12-05 MED ORDER — FERRIC CITRATE 1 GM 210 MG(FE) PO TABS
210.0000 mg | ORAL_TABLET | Freq: Three times a day (TID) | ORAL | Status: DC
  Administered 2019-12-06 – 2019-12-11 (×13): 210 mg via ORAL
  Filled 2019-12-05 (×20): qty 1

## 2019-12-05 MED ORDER — SODIUM CHLORIDE 0.9 % IV SOLN: 250.0000 mL | INTRAVENOUS | Status: DC | PRN

## 2019-12-05 MED ORDER — PREDNISONE 20 MG PO TABS
ORAL_TABLET | ORAL | 0 refills | Status: DC
Start: 2019-12-06 — End: 2020-01-15

## 2019-12-05 MED ORDER — LINAGLIPTIN 5 MG PO TABS
5.0000 mg | ORAL_TABLET | Freq: Every day | ORAL | Status: DC
  Administered 2019-12-06 – 2019-12-11 (×5): 5 mg via ORAL
  Filled 2019-12-05 (×7): qty 1

## 2019-12-05 MED ORDER — METHYLPREDNISOLONE SODIUM SUCC 125 MG IJ SOLR
60.0000 mg | Freq: Once | INTRAMUSCULAR | Status: AC
  Administered 2019-12-05: 60 mg via INTRAVENOUS
  Filled 2019-12-05: qty 2

## 2019-12-05 MED ORDER — ONDANSETRON HCL 4 MG PO TABS: 4.0000 mg | ORAL_TABLET | Freq: Four times a day (QID) | ORAL | Status: DC | PRN

## 2019-12-05 MED ORDER — CHLORHEXIDINE GLUCONATE CLOTH 2 % EX PADS
6.0000 | MEDICATED_PAD | Freq: Every day | CUTANEOUS | Status: DC
  Administered 2019-12-07 – 2019-12-11 (×3): 6 via TOPICAL
  Filled 2019-12-05 (×3): qty 6

## 2019-12-05 MED ORDER — LOSARTAN POTASSIUM 50 MG PO TABS: 50.0000 mg | ORAL_TABLET | Freq: Every day | ORAL | 0 refills | Status: AC

## 2019-12-05 MED ORDER — ONDANSETRON HCL 4 MG/2ML IJ SOLN: 4.0000 mg | Freq: Four times a day (QID) | INTRAMUSCULAR | Status: DC | PRN

## 2019-12-05 MED ORDER — LABETALOL HCL 5 MG/ML IV SOLN: 20.0000 mg | INTRAVENOUS | Status: DC | PRN

## 2019-12-05 MED ORDER — MAGNESIUM HYDROXIDE 400 MG/5ML PO SUSP: 30.0000 mL | Freq: Every day | ORAL | Status: DC | PRN

## 2019-12-05 MED ORDER — METHYLPREDNISOLONE SODIUM SUCC 40 MG IJ SOLR
40.0000 mg | Freq: Two times a day (BID) | INTRAMUSCULAR | Status: DC
  Administered 2019-12-06: 40 mg via INTRAVENOUS
  Filled 2019-12-05: qty 1

## 2019-12-05 MED ORDER — TIZANIDINE HCL 2 MG PO TABS
2.0000 mg | ORAL_TABLET | Freq: Three times a day (TID) | ORAL | Status: DC | PRN
  Administered 2019-12-09 – 2019-12-11 (×3): 2 mg via ORAL
  Filled 2019-12-05 (×5): qty 1

## 2019-12-05 MED ORDER — HEPARIN SODIUM (PORCINE) 5000 UNIT/ML IJ SOLN
5000.0000 [IU] | Freq: Three times a day (TID) | INTRAMUSCULAR | Status: DC
  Administered 2019-12-05 – 2019-12-10 (×13): 5000 [IU] via SUBCUTANEOUS
  Filled 2019-12-05 (×14): qty 1

## 2019-12-05 NOTE — ED Notes (Signed)
Patient's brother to bedside.

## 2019-12-05 NOTE — ED Notes (Signed)
Patient's son is going to go home and get some rest. States he will return in a couple of hours.

## 2019-12-05 NOTE — ED Notes (Signed)
Patient to go to dialysis in 20 mins. Will call for transport to get patient to [place]

## 2019-12-05 NOTE — ED Notes (Signed)
Coreg held as patient received Hydrazaline and nitropaste was in place. Due to rapid decrease in blood pressure, nitropaste removed and Coreg held at this time. Will continue to monitor blood pressure.

## 2019-12-05 NOTE — Progress Notes (Signed)
[facility], [place] 12/05/19  Subjective:   LOS: 0 Patient known to our practice from previous admissions Presents this morning for shortness of breath that started in the middle of the night He did not go to his dialysis treatment but came to the ER for evaluation Last HD was this past Tuesday Upon presentation, blood pressure was 222/102 Patient was given Nitropaste and placed on BiPAP Nephrology consult has now been requested for evaluation  Objective:  Vital signs in last 24 hours:  Temp:  [98.2 F (36.8 C)] 98.2 F (36.8 C) (12/02 0400) Pulse Rate:  [91-107] 101 (12/02 0800) Resp:  [11-27] 11 (12/02 0800) BP: (138-222)/(65-106) 144/67 (12/02 0800) SpO2:  [100 %] 100 % (12/02 0836) Weight:  [104.7 kg] 104.7 kg (12/02 0405)  Weight change:  Filed Weights   12/05/19 0405  Weight: 104.7 kg    Intake/Output:   No intake or output data in the 24 hours ending 12/05/19 0844  Physical Exam: General:  No acute distress, laying in the bed  HEENT  anicteric, moist oral mucous membrane  Pulm/lungs  requiring BiPAP, minimal crackles at lung bases  CVS/Heart  regular rhythm, no rub or gallop  Abdomen:   Soft, nontender  Extremities:  No peripheral edema  Neurologic:  Alert, oriented, able to follow commands  Skin:  Rash around catheter exit site      Basic Metabolic Panel:  Recent Labs  Lab 11/30/19 1025 12/05/19 0402  NA 134* 135  K 3.9 5.2*  CL 96* 101  CO2 24 21*  GLUCOSE 142* 117*  BUN 47* 59*  CREATININE 7.37* 8.31*  CALCIUM 8.8* 8.5*  MG  --  2.2  PHOS  --  6.6*     CBC: Recent Labs  Lab 11/30/19 1025 12/05/19 0402  WBC 8.6 16.4*  NEUTROABS  --  13.3*  HGB 9.7* 9.8*  HCT 29.0* 29.5*  MCV 90.3 93.4  PLT 362 368      Lab Results  Component Value Date   HEPBSAG NON REACTIVE 05/17/2019   HEPBSAB Non Reactive 01/12/2018   HEPBIGM Negative 01/12/2018      Microbiology:  Recent Results (from the past 240  hour(s))  Resp Panel by RT-PCR (Flu A&B, Covid) Nasopharyngeal Swab     Status: None   Collection Time: 12/05/19  4:02 AM   Specimen: Nasopharyngeal Swab; Nasopharyngeal(NP) swabs in vial transport medium  Result Value Ref Range Status   SARS Coronavirus 2 by RT PCR NEGATIVE NEGATIVE Final    Comment: (NOTE) SARS-CoV-2 target nucleic acids are NOT DETECTED.  The SARS-CoV-2 RNA is generally detectable in upper respiratory specimens during the acute phase of infection. The lowest concentration of SARS-CoV-2 viral copies this assay can detect is 138 copies/mL. A negative result does not preclude SARS-Cov-2 infection and should not be used as the sole basis for treatment or other patient management decisions. A negative result may occur with  improper specimen collection/handling, submission of specimen other than nasopharyngeal swab, presence of viral mutation(s) within the areas targeted by this assay, and inadequate number of viral copies(<138 copies/mL). A negative result must be combined with clinical observations, patient history, and epidemiological information. The expected result is Negative.  Fact Sheet for Patients:  EntrepreneurPulse.com.au  Fact Sheet for Healthcare Providers:  IncredibleEmployment.be  This test is no t yet approved or cleared by the [place] FDA and  has been authorized for detection and/or diagnosis of SARS-CoV-2 by FDA under an Emergency Use Authorization (EUA).  This EUA will remain  in effect (meaning this test can be used) for the duration of the COVID-19 declaration under Section 564(b)(1) of the Act, 21 U.S.C.section 360bbb-3(b)(1), unless the authorization is terminated  or revoked sooner.       Influenza A by PCR NEGATIVE NEGATIVE Final   Influenza B by PCR NEGATIVE NEGATIVE Final    Comment: (NOTE) The Xpert Xpress SARS-CoV-2/FLU/RSV plus assay is intended as an aid in the diagnosis of influenza from  Nasopharyngeal swab specimens and should not be used as a sole basis for treatment. Nasal washings and aspirates are unacceptable for Xpert Xpress SARS-CoV-2/FLU/RSV testing.  Fact Sheet for Patients: EntrepreneurPulse.com.au  Fact Sheet for Healthcare Providers: IncredibleEmployment.be  This test is not yet approved or cleared by the [place] FDA and has been authorized for detection and/or diagnosis of SARS-CoV-2 by FDA under an Emergency Use Authorization (EUA). This EUA will remain in effect (meaning this test can be used) for the duration of the COVID-19 declaration under Section 564(b)(1) of the Act, 21 U.S.C. section 360bbb-3(b)(1), unless the authorization is terminated or revoked.  Performed at [facility], [address] [place], [place] [address]   Culture, blood (Routine X 2) w Reflex to ID Panel     Status: None (Preliminary result)   Collection Time: 12/05/19  6:26 AM   Specimen: BLOOD  Result Value Ref Range Status   Specimen Description BLOOD LEFT ANTECUBITAL  Final   Special Requests   Final    BOTTLES DRAWN AEROBIC AND ANAEROBIC Blood Culture results may not be optimal due to an excessive volume of blood received in culture bottles   Culture   Final    NO GROWTH <12 HOURS Performed at [facility] [address] [place], [place] [address]    Report Status PENDING  Incomplete    Coagulation Studies: Recent Labs    12/05/19 0402  LABPROT 13.8  INR 1.1    Urinalysis: No results for input(s): COLORURINE, LABSPEC, PHURINE, GLUCOSEU, HGBUR, BILIRUBINUR, KETONESUR, PROTEINUR, UROBILINOGEN, NITRITE, LEUKOCYTESUR in the last 72 hours.  Invalid input(s): APPERANCEUR    Imaging: DG Chest Port 1 View  Result Date: 12/05/2019 CLINICAL DATA:  Dyspnea, congestive heart failure EXAM: PORTABLE CHEST 1 VIEW COMPARISON:  06/29/2019 FINDINGS: Lungs are clear. No pneumothorax or pleural effusion. Left  internal jugular hemodialysis catheter tip is seen at the superior right atrium. Cardiac size within normal limits. Pulmonary vascularity is normal. No acute bone abnormality. IMPRESSION: No active disease. Electronically Signed   By: Fidela Salisbury MD   On: 12/05/2019 04:29     Medications:   . sodium chloride    . cefTRIAXone (ROCEPHIN)  IV    . nitroGLYCERIN 50 mcg/min (12/05/19 0649)   . carvedilol  12.5 mg Oral BID WC  . citalopram  20 mg Oral Daily  . ergocalciferol  50,000 Units Oral UD  . ferric citrate  210 mg Oral UD  . furosemide  60 mg Intravenous Q12H  . heparin  5,000 Units Subcutaneous Q8H  . hydrALAZINE  25 mg Oral Q8H  . lidocaine  1 patch Transdermal Q12H  . linagliptin  5 mg Oral Daily  . methylPREDNISolone (SOLU-MEDROL) injection  40 mg Intravenous Q6H   Followed by  . [START ON 12/06/2019] predniSONE  40 mg Oral Q breakfast  . sodium chloride flush  3 mL Intravenous Q12H   sodium chloride, acetaminophen **OR** acetaminophen, hydrALAZINE, labetalol, magnesium hydroxide, ondansetron **OR** ondansetron (ZOFRAN) IV, sodium chloride flush,  tiZANidine, traZODone  Assessment/ Plan:  64 y.o. male with COPD, diabetes mellitus type 2, BPH, prostate cancer followed bu urology hypertension, hyperlipidemia, ESRD on HD TTS, anemia of chronic kidney disease, secondary hyperparathyroidism  was admitted on 12/05/2019 for  Active Problems:   COPD exacerbation ([place])  COPD exacerbation ([place]) [J44.1]  [facility] [address]/TTS Left IJ PermCath  #. ESRD #Shortness of breath, hypertensive emergency Chest x-ray shows normal pulmonary vascularity.  Lungs are clear. Shortness breath is likely secondary to diastolic dysfunction and hypertensive emergency Currently managed with Coreg 12.5 twice a day, Lasix IV, hydralazine 25 mg 3 times a day, IV labetalol as needed and nitroglycerin IV which has been discontinued now Blood pressure Has improved this morning and patient  breathing more comfortably.  ABG has been performed and trial of breathing off BiPAP is being considered. We will plan on dialysis today Patient is noted to be on amlodipine on the med list during previous admissions, but it is not listed on his home meds currently. Plan to restart amlodipine but it should be dosed at night Add low dose ARB to regimen  #. Anemia of CKD  Lab Results  Component Value Date   HGB 9.8 (L) 12/05/2019   Low dose EPO with HD Was getting Micera as outpatient  #. Secondary hyperparathyroidism of renal origin N 25.81      Component Value Date/Time   PTH 138 (H) 05/17/2019 0336   Lab Results  Component Value Date   PHOS 6.6 (H) 12/05/2019   Monitor calcium and phos level during this admission   #. Diabetes type 2 with CKD Hgb A1c MFr Bld (%)  Date Value  01/07/2018 8.5 (H)  repeat HbA1c    LOS: 0 Jourdin Gens 12/2/20218:44 [place], [place]

## 2019-12-05 NOTE — ED Notes (Signed)
Patient taken off bipap and placed on 2L O2 [place] so he can eat his breakfast. Patient's son at bedside will assist with what patient might need for meal tray and opening packets. Will call CT and let them know when patient has finished his breakfast.

## 2019-12-05 NOTE — ED Notes (Signed)
Patient returned from CT and was taken to dialysis.

## 2019-12-05 NOTE — ED Notes (Addendum)
Pt back to room in ER from dialysis. Pt requesting to sign out AMA. Pt confused, febrile, and tachycardic upon returning to ED. Pt states that he is currently at [facility] and not oriented to time or situation. Pt refusing to take any medications at this time. MD notified.

## 2019-12-05 NOTE — Consult Note (Signed)
Pharmacy Antibiotic Note  Daniel Thomas is a 64 y.o. male admitted on 12/05/2019 with unclear source of infection but with long-term indwelling catheter in place ISO SIRS.  Pharmacy has been consulted for Vancomycin dosing.  Plan: Vancomycin 2.25g (~22mg /kg) loading dose x1.  - Maintenance dose interval to be set as inpatient HD schedule is determined. - Anticipate 1g doses post-HD once established.  Weight: 104.7 kg (230 lb 13.2 oz)  Temp (24hrs), Avg:99.7 F (37.6 C), Min:97.9 F (36.6 C), Max:103 F (39.4 C)  Recent Labs  Lab 11/30/19 1025 12/05/19 0402  WBC 8.6 16.4*  CREATININE 7.37* 8.31*    Estimated Creatinine Clearance: 9.4 mL/min (A) (by C-G formula based on SCr of 8.31 mg/dL (H)).    No Known Allergies  Antimicrobials this admission: Ceftriaxone  1g 12/2 >>  Vancomycin 2.25g; 1g qHD 12/2>>    Microbiology results: 12/2 BCx: NGTD 12/2 Sputum: NGTD  12/2 MRSA PCR: pending  Thank you for allowing pharmacy to be a part of this patient's care.  Shanon Brow Imaya Duffy 12/05/2019 3:12 PM

## 2019-12-05 NOTE — Progress Notes (Signed)
Addendum: Informed by staff that patient had a fever of 101 postdialysis Blood pressure is lower PermCath infection is suspected Discussed with vascular.  PermCath will be removed tomorrow.  Plan for new PermCath placement early next week provided blood cultures are negative If dialysis needed over the weekend, patient will need temporary dialysis catheter

## 2019-12-05 NOTE — ED Provider Notes (Signed)
[facility] Emergency Department Provider Note  ____________________________________________   First MD Initiated Contact with Patient 12/05/19 [phone]     (approximate)  I have reviewed the triage vital signs and the nursing notes.   Level 5 caveat:  history/ROS limited by acute/critical illness   HISTORY  Chief Complaint Shortness of Breath and Respiratory Distress    HPI Daniel Thomas is a 64 y.o. male with medical history as listed below which notably includes end-stage renal disease on dialysis Tuesdays, Thursdays, and Saturdays.  He presents by EMS in severe respiratory distress.  He has been doing okay until he woke up a couple of hours ago with difficulty breathing.  The breathing rapidly worsened until he called EMS.  When they arrived he was in severe distress and tripoding.  They were unable to get a pulse ox reading and his fingers were cold.  They put him on CPAP and administered 1 inch of nitroglycerin paste on his chest.  His initial blood pressure was about 220/110 and it has remained that high in spite of the nitroglycerin.  However by the time he arrived in the emergency department he was feeling much better and able to breathe and speak in short but full sentences.  He said that he feels better.  He denies having any chest pain.  He does not weigh himself regularly so he does not know if his overall weight is up.  He denies fever, sore throat, nausea, vomiting, and abdominal pain.  He reports feeling that his abdomen is very full and that he is swollen in general and he thinks he has had some leg swelling.  Onset was acute, symptoms are severe, and the CPAP helped him feel better.  Any amount of exertion made him feel more short of breath.        Past Medical History:  Diagnosis Date  . COPD (chronic obstructive pulmonary disease) ([place])   . Diabetes mellitus without complication ([place])   . Enlarged prostate   . ESRD on dialysis ([place])   .  Heart murmur   . High cholesterol   . Hypertension     Patient Active Problem List   Diagnosis Date Noted  . COPD exacerbation ([place]) 12/05/2019  . Bacterial infection, unspecified 05/17/2019  . Bacterial intestinal infection, unspecified 05/17/2019  . Septicemia due to coagulase-negative staphylococcal infection ([place]) 05/14/2019  . Sepsis ([place]) 05/12/2019  . ESRD (end stage renal disease) ([place]) 05/12/2019  . Leucocytosis 05/12/2019  . Dialysis AV fistula malfunction ([place]) 04/17/2018  . Anemia, unspecified 01/26/2018  . Benign prostatic hyperplasia without lower urinary tract symptoms 01/16/2018  . Chronic obstructive pulmonary disease, unspecified ([place]) 01/16/2018  . Hyperkalemia 01/12/2018  . 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 DIALYSIS/PERMA CATHETER INSERTION N/A 01/12/2018   Procedure: DIALYSIS/PERMA CATHETER INSERTION;  Surgeon: Katha Cabal, MD;  Location: [place] [facility];  Service: Cardiovascular;  Laterality: N/A;  . DIALYSIS/PERMA CATHETER INSERTION N/A 05/16/2019   Procedure: DIALYSIS/PERMA CATHETER INSERTION;  Surgeon: Algernon Huxley, MD;  Location: [place] [facility];  Service: Cardiovascular;  Laterality: N/A;  . DIALYSIS/PERMA CATHETER INSERTION N/A 10/17/2019   Procedure: DIALYSIS/PERMA CATHETER INSERTION;  Surgeon: Algernon Huxley, MD;  Location: [place] [facility];  Service: Cardiovascular;  Laterality: N/A;  . DIALYSIS/PERMA CATHETER REMOVAL N/A 05/13/2019   Procedure: DIALYSIS/PERMA CATHETER REMOVAL;  Surgeon: Algernon Huxley, MD;  Location: [place] [facility];  Service: Cardiovascular;  Laterality: N/A;  . TEE WITHOUT CARDIOVERSION N/A 05/15/2019   Procedure: TRANSESOPHAGEAL ECHOCARDIOGRAM (TEE);  Surgeon: Teodoro Spray, MD;  Location: [facility];  Service: Cardiovascular;  Laterality: N/A;    Prior to Admission medications   Medication Sig Start Date End Date Taking? Authorizing Provider  albuterol  (VENTOLIN HFA) 108 (90 Base) MCG/ACT inhaler Inhale 2 puffs into the lungs every 6 (six) hours as needed for wheezing or shortness of breath.   Yes [provider]  atorvastatin (LIPITOR) 40 MG tablet Take 40 mg by mouth at bedtime.   Yes [provider]  calcium carbonate (TUMS - DOSED IN MG ELEMENTAL CALCIUM) 500 MG chewable tablet Chew 1 tablet by mouth every 6 (six) hours as needed for indigestion.   Yes [provider]  carvedilol (COREG) 12.5 MG tablet Take 12.5 mg by mouth 2 (two) times daily with a meal. 03/31/18  Yes [provider]  citalopram (CELEXA) 20 MG tablet Take 20 mg by mouth daily.   Yes [provider]  ergocalciferol (DRISDOL) 1.25 MG (50000 UT) capsule Take by mouth. Administered at analysis 02/02/18  Yes [provider]  ferric citrate (AURYXIA) 1 GM 210 MG(Fe) tablet Take by mouth.  04/29/19  Yes [provider]  hydrALAZINE (APRESOLINE) 25 MG tablet Take 1 tablet (25 mg total) by mouth every 8 (eight) hours. 05/17/19  Yes Nolberto Hanlon, MD  lidocaine (LIDODERM) 5 % Place 1 patch onto the skin every 12 (twelve) hours. Remove & Discard patch within 12 hours or as directed by MD 11/10/19 11/09/20 Yes Laban Emperor, PA-C  linagliptin (TRADJENTA) 5 MG TABS tablet Take 1 tablet (5 mg total) by mouth daily. 01/09/18  Yes Wieting, Richard, MD  meloxicam (MOBIC) 7.5 MG tablet Take 7.5 mg by mouth daily. 11/06/19  Yes [provider]  Methoxy PEG-Epoetin Beta (MIRCERA IJ) Mircera 01/08/19 03/03/20 Yes [provider]  tiZANidine (ZANAFLEX) 2 MG tablet Take 2 mg by mouth 3 (three) times daily as needed. 11/06/19  Yes [provider]  traZODone (DESYREL) 50 MG tablet Take 50 mg by mouth at bedtime as needed for sleep.   Yes [provider]    Allergies Patient has no known allergies.  History reviewed. No pertinent family history.  Social History Social History   Tobacco Use  . Smoking status:  Former Smoker    Years: 20.00  . Smokeless tobacco: Never Used  Vaping Use  . Vaping Use: Never used  Substance Use Topics  . Alcohol use: Never  . Drug use: Never    Review of Systems Level 5 caveat:  history/ROS limited by acute/critical illness   constitutional: No fever/chills Eyes: No visual changes. ENT: No sore throat. Cardiovascular: Denies chest pain. Respiratory: + Shortness of breath Gastrointestinal: + Abdominal swelling . no abdominal pain.  No nausea, no vomiting.  No diarrhea.  No constipation. Genitourinary: Negative for dysuria. Musculoskeletal: Negative for neck pain.  Negative for back pain. Integumentary: Negative for rash. Neurological: Negative for headaches, focal weakness or numbness.   ____________________________________________   PHYSICAL EXAM:  VITAL SIGNS: ED Triage Vitals  Enc Vitals Group     BP 12/05/19 0400 (!) 222/102     Pulse Rate 12/05/19 0400 (!) 107     Resp 12/05/19 0400 (!) 27     Temp 12/05/19 0400 98.2 F (36.8 C)     Temp Source 12/05/19 0400 Oral     SpO2 12/05/19 0400 100 %     Weight 12/05/19  0405 104.7 kg (230 lb 13.2 oz)     Height --      Head Circumference --      Peak Flow --      Pain Score 12/05/19 0400 0     Pain Loc --      Pain Edu? --      Excl. in [place]? --     Constitutional: Alert and oriented.  Eyes: Conjunctivae are normal.  Head: Atraumatic. Nose: No congestion/rhinnorhea. Mouth/Throat: Patient is wearing a CPAP. Neck: No stridor.  No meningeal signs.   Cardiovascular: Mild tachycardia, regular rhythm. Good peripheral circulation. Grossly normal heart sounds. Respiratory: Increased work of breathing with some accessory muscle usage but according to the patient it is improved from prior.  Coarse breath sounds throughout.  Now able to speak in sentences. Gastrointestinal: Abdomen is nontender but distended, consistent with anasarca rather than acute intra-abdominal abnormality. Musculoskeletal: No  lower extremity tenderness nor edema. No gross deformities of extremities. Neurologic:  Normal speech and language. No gross focal neurologic deficits are appreciated.  Skin:  Skin is warm, dry and intact. Psychiatric: Mood and affect are normal. Speech and behavior are normal.  ____________________________________________   LABS (all labs ordered are listed, but only abnormal results are displayed)  Labs Reviewed  COMPREHENSIVE METABOLIC PANEL - Abnormal; Notable for the following components:      Result Value   Potassium 5.2 (*)    CO2 21 (*)    Glucose, Bld 117 (*)    BUN 59 (*)    Creatinine, Ser 8.31 (*)    Calcium 8.5 (*)    GFR, Estimated 7 (*)    All other components within normal limits  CBC WITH DIFFERENTIAL/PLATELET - Abnormal; Notable for the following components:   WBC 16.4 (*)    RBC 3.16 (*)    Hemoglobin 9.8 (*)    HCT 29.5 (*)    RDW 15.7 (*)    Neutro Abs 13.3 (*)    Monocytes Absolute 1.4 (*)    All other components within normal limits  PHOSPHORUS - Abnormal; Notable for the following components:   Phosphorus 6.6 (*)    All other components within normal limits  BRAIN NATRIURETIC PEPTIDE - Abnormal; Notable for the following components:   B Natriuretic Peptide 902.6 (*)    All other components within normal limits  TROPONIN I (HIGH SENSITIVITY) - Abnormal; Notable for the following components:   Troponin I (High Sensitivity) 42 (*)    All other components within normal limits  RESP PANEL BY RT-PCR (FLU A&B, COVID) ARPGX2  CULTURE, BLOOD (ROUTINE X 2)  CULTURE, BLOOD (ROUTINE X 2)  EXPECTORATED SPUTUM ASSESSMENT W REFEX TO RESP CULTURE  PROTIME-INR  APTT  MAGNESIUM  TROPONIN I (HIGH SENSITIVITY)   ____________________________________________  EKG  ED ECG REPORT I, Hinda Kehr, the attending physician, personally viewed and interpreted this ECG.  Date: 12/05/2019 EKG Time: 3:59 AM Rate: 109 Rhythm: Mild sinus tachycardia QRS Axis:  normal Intervals: Multiform ventricular premature complexes, otherwise unremarkable ST/T Wave abnormalities: Non-specific ST segment / T-wave changes, but no clear evidence of acute ischemia. Narrative Interpretation: no definitive evidence of acute ischemia; does not meet STEMI criteria.   ____________________________________________  RADIOLOGY I, Hinda Kehr, personally viewed and evaluated these images (plain radiographs) as part of my medical decision making, as well as reviewing the written report by the radiologist.  ED MD interpretation:  No acute abnormalities identified on CXR  Official radiology report(s): DG Chest Port 1  View  Result Date: 12/05/2019 CLINICAL DATA:  Dyspnea, congestive heart failure EXAM: PORTABLE CHEST 1 VIEW COMPARISON:  06/29/2019 FINDINGS: Lungs are clear. No pneumothorax or pleural effusion. Left internal jugular hemodialysis catheter tip is seen at the superior right atrium. Cardiac size within normal limits. Pulmonary vascularity is normal. No acute bone abnormality. IMPRESSION: No active disease. Electronically Signed   By: Fidela Salisbury MD   On: 12/05/2019 04:29    ____________________________________________   PROCEDURES   Procedure(s) performed (including Critical Care):  .Critical Care Performed by: Hinda Kehr, MD Authorized by: Hinda Kehr, MD   Critical care provider statement:    Critical care time (minutes):  45   Critical care time was exclusive of:  Separately billable procedures and treating other patients   Critical care was necessary to treat or prevent imminent or life-threatening deterioration of the following conditions:  Respiratory failure and cardiac failure   Critical care was time spent personally by me on the following activities:  Development of treatment plan with patient or surrogate, discussions with consultants, evaluation of patient's response to treatment, examination of patient, obtaining history from patient  or surrogate, ordering and performing treatments and interventions, ordering and review of laboratory studies, ordering and review of radiographic studies, pulse oximetry, re-evaluation of patient's condition and review of old charts .1-3 Lead EKG Interpretation Performed by: Hinda Kehr, MD Authorized by: Hinda Kehr, MD     Interpretation: abnormal     ECG rate:  107   ECG rate assessment: tachycardic     Rhythm: sinus tachycardia     Ectopy: none     Conduction: normal       ____________________________________________   INITIAL IMPRESSION / MDM / ASSESSMENT AND PLAN / ED COURSE  As part of my medical decision making, I reviewed the following data within the [facility] notes reviewed and incorporated, Labs reviewed , EKG interpreted , Old chart reviewed, Radiograph reviewed , Discussed with admitting physician (Dr. Sidney Ace), Discussed with nephrologist (Dr. Candiss Norse) and reviewed Notes from prior ED visits   Differential diagnosis includes, but is not limited to, flash pulmonary edema in the setting of CHF and chronic renal failure, pneumonia, COVID-19, ACS, PE.  The patient is on the cardiac monitor to evaluate for evidence of arrhythmia and/or significant heart rate changes.  Blood pressure is substantially elevated at about 220/102.  He said that his blood pressure is always high, but I doubt it is that high at baseline.  I will continue with the inch of nitroglycerin paste on his chest for now but we may need to escalate to a nitroglycerin infusion.  I ordered BiPAP given that the positive pressure seems to be helping.  His EKG shows no sign of ischemia.  He has been reporting no chest pain.  Lab work is pending.  Anticipate stabilizing the patient and admission for urgent the likely nonemergent dialysis.  Of note the patient reports that he still produces a small amount of urine but it is relatively insignificant and irregular and I doubt that Lasix would  be of benefit.     Clinical Course as of Dec 04 636  Thu Dec 05, 2019  0429 elevated WBC, non-specific, otherwise unremarkable  CBC WITH DIFFERENTIAL(!) [CF]  [phone] I personally reviewed the patient's imaging and agree with the radiologist's interpretation that there is no obvious pulmonary edema nor lobar pneumonia on the chest x-ray, which is somewhat surprising given the clinical presentation.I reassessed the patient he continues to  have tight and occasionally coarse breath sounds throughout but is speaking in full sentences and feels much better, still on BiPAP.  His blood pressure is still substantially elevated in the 200/100 range.  I confirmed that he also has a history of COPD and I will treat empirically with inline nebulizer treatments given that there is likely an obstructive component of his acute dyspnea as well.  DG Chest Port 1 View [CF]  [phone] Patient is feeling much better.  BNP is over 900 which is higher than previous values on record.  Phosphorus and troponin are both elevated as well but I suspect demand ischemia as well as chronic kidney failure.  COVID-19 test is negative.  Comprehensive metabolic panel is generally reassuring; he has potassium of 5.2 which is again to be expected for someone who is supposed get dialysis this morning.I called and spoke by phone with Dr. Candiss Norse with nephrology to let him know that the patient will need dialysis this morning although not emergently.  He will put him on the list.  I consulted the hospitalist for admission.   [CF]  [phone] I discussed the case by phone with Dr. Sidney Ace with the hospitalist service and he will admit the patient.   [CF]    Clinical Course User Index [CF] Hinda Kehr, MD     ____________________________________________  FINAL CLINICAL IMPRESSION(S) / ED DIAGNOSES  Final diagnoses:  Acute respiratory failure, unspecified whether with hypoxia or hypercapnia (HCC)  COPD exacerbation ([place])  Acute on chronic  congestive heart failure, unspecified heart failure type ([place])  Chronic kidney disease with end stage renal failure on dialysis ([place])  Hyperkalemia  Hypertensive emergency     MEDICATIONS GIVEN DURING THIS VISIT:  Medications  nitroGLYCERIN 50 mg in dextrose 5 % 250 mL (0.2 mg/mL) infusion (60 mcg/min Intravenous Rate/Dose Change 12/05/19 0543)  carvedilol (COREG) tablet 12.5 mg (has no administration in time range)  hydrALAZINE (APRESOLINE) tablet 25 mg (25 mg Oral Not Given 12/05/19 0615)  citalopram (CELEXA) tablet 20 mg (has no administration in time range)  linagliptin (TRADJENTA) tablet 5 mg (has no administration in time range)  ferric citrate (AURYXIA) tablet 210 mg (has no administration in time range)  tiZANidine (ZANAFLEX) tablet 2 mg (has no administration in time range)  ergocalciferol (VITAMIN D2) capsule 50,000 Units (has no administration in time range)  lidocaine (LIDODERM) 5 % 1 patch (has no administration in time range)  cefTRIAXone (ROCEPHIN) 1 g in sodium chloride 0.9 % 100 mL IVPB (has no administration in time range)  methylPREDNISolone sodium succinate (SOLU-MEDROL) 40 mg/mL injection 40 mg (40 mg Intravenous Not Given 12/05/19 0615)    Followed by  predniSONE (DELTASONE) tablet 40 mg (has no administration in time range)  furosemide (LASIX) injection 60 mg (has no administration in time range)  heparin injection 5,000 Units (has no administration in time range)  sodium chloride flush (NS) 0.9 % injection 3 mL (has no administration in time range)  sodium chloride flush (NS) 0.9 % injection 3 mL (has no administration in time range)  0.9 %  sodium chloride infusion (has no administration in time range)  acetaminophen (TYLENOL) tablet 650 mg (has no administration in time range)    Or  acetaminophen (TYLENOL) suppository 650 mg (has no administration in time range)  traZODone (DESYREL) tablet 25 mg (has no administration in time range)  magnesium hydroxide (MILK  OF MAGNESIA) suspension 30 mL (has no administration in time range)  ondansetron (ZOFRAN) tablet 4 mg (has no  administration in time range)    Or  ondansetron (ZOFRAN) injection 4 mg (has no administration in time range)  hydrALAZINE (APRESOLINE) injection 10 mg (has no administration in time range)  labetalol (NORMODYNE) injection 20 mg (has no administration in time range)  ipratropium-albuterol (DUONEB) 0.5-2.5 (3) MG/3ML nebulizer solution 3 mL (3 mLs Nebulization Given 12/05/19 0520)  ipratropium-albuterol (DUONEB) 0.5-2.5 (3) MG/3ML nebulizer solution 3 mL (3 mLs Nebulization Given 12/05/19 0520)  ipratropium-albuterol (DUONEB) 0.5-2.5 (3) MG/3ML nebulizer solution 3 mL (3 mLs Nebulization Given 12/05/19 0520)  methylPREDNISolone sodium succinate (SOLU-MEDROL) 125 mg/2 mL injection 60 mg (60 mg Intravenous Given 12/05/19 0520)     ED Discharge Orders    None      *Please note:  Daniel Thomas was evaluated in Emergency Department on 12/05/2019 for the symptoms described in the history of present illness. He was evaluated in the context of the global COVID-19 pandemic, which necessitated consideration that the patient might be at risk for infection with the SARS-CoV-2 virus that causes COVID-19. Institutional protocols and algorithms that pertain to the evaluation of patients at risk for COVID-19 are in a state of rapid change based on information released by regulatory bodies including the CDC and federal and state organizations. These policies and algorithms were followed during the patient's care in the ED.  Some ED evaluations and interventions may be delayed as a result of limited staffing during and after the pandemic.*  Note:  This document was prepared using Dragon voice recognition software and may include unintentional dictation errors.   Hinda Kehr, MD 12/05/19 [phone]

## 2019-12-05 NOTE — ED Triage Notes (Signed)
Pt to ED via [facility] c/o respiratory distress.  States woke up at 0100 tonight with it, dialysis patient T, TH, Sat and supposed to go today.  EMS reports very little air movement upon arrival, given 1 inch nitro paste to right chest and placed on CPAP.  Pt presents A&Ox4, placed on BiPAP upon arrival.

## 2019-12-05 NOTE — H&P (Signed)
[place]   PATIENT NAME: Daniel Thomas    MR#:  [id number]  DATE OF BIRTH:  06/29/1955  DATE OF ADMISSION:  12/05/2019  PRIMARY CARE PHYSICIAN: Marguerita Merles, MD   REQUESTING/REFERRING PHYSICIAN: Hinda Kehr, MD  CHIEF COMPLAINT:   Chief Complaint  Patient presents with  . Shortness of Breath  . Respiratory Distress    HISTORY OF PRESENT ILLNESS:  Daniel Thomas  is a 64 y.o. African-American male with a known history of COPD, type 2 diabetes mellitus, hypertension, dyslipidemia and end-stage renal disease on hemodialysis, who presented to the emergency room with acute onset of respiratory distress with worsening dyspnea early this morning before he called EMS.  He denied any fever or chills.  No chest pain or palpitations.  No nausea or vomiting or abdominal pain.  He had his hemodialysis on Tuesday and is due today.  Upon presentation to the emergency room, blood pressure was elevated at 222/102 with a heart rate of 107 and respiratory rate of 27 and pulse ox symmetry was 100% on100% FiO2 on CPAP.  He was switched to BiPAP.  Labs revealed mild hyperkalemia 5.2 and a BUN of 59 and creatinine of 8.31 with a phosphorus of 6.6 and magnesium 2.2 and normal LFTs.  BNP was 902.6 and high-sensitivity troponin I 42.  CBC showed leukocytosis 16.4 with neutrophilia as well as anemia close to baseline.  Influenza antigens and COVID-19 PCR came back negative. Chest x-ray showed no acute cardiopulmonary disease. EKG showed sinus tachycardia with a rate of 109 with PVCs and Q waves anteroseptally.  Patient was given 3 DuoNeb's, 60 milligrams of IV Solu-Medrol and IV nitroglycerin drip.  He will be admitted to a progressive unit bed for further evaluation and management. PAST MEDICAL HISTORY:   Past Medical History:  Diagnosis Date  . COPD (chronic obstructive pulmonary disease) ([place])   . Diabetes mellitus without complication ([place])   . Enlarged prostate   . ESRD on dialysis ([place])   .  Heart murmur   . High cholesterol   . Hypertension     PAST SURGICAL HISTORY:   Past Surgical History:  Procedure Laterality Date  . DIALYSIS/PERMA CATHETER INSERTION N/A 01/12/2018   Procedure: DIALYSIS/PERMA CATHETER INSERTION;  Surgeon: Katha Cabal, MD;  Location: [facility];  Service: Cardiovascular;  Laterality: N/A;  . DIALYSIS/PERMA CATHETER INSERTION N/A 05/16/2019   Procedure: DIALYSIS/PERMA CATHETER INSERTION;  Surgeon: Algernon Huxley, MD;  Location: [facility];  Service: Cardiovascular;  Laterality: N/A;  . DIALYSIS/PERMA CATHETER INSERTION N/A 10/17/2019   Procedure: DIALYSIS/PERMA CATHETER INSERTION;  Surgeon: Algernon Huxley, MD;  Location: [facility];  Service: Cardiovascular;  Laterality: N/A;  . DIALYSIS/PERMA CATHETER REMOVAL N/A 05/13/2019   Procedure: DIALYSIS/PERMA CATHETER REMOVAL;  Surgeon: Algernon Huxley, MD;  Location: [facility];  Service: Cardiovascular;  Laterality: N/A;  . TEE WITHOUT CARDIOVERSION N/A 05/15/2019   Procedure: TRANSESOPHAGEAL ECHOCARDIOGRAM (TEE);  Surgeon: Teodoro Spray, MD;  Location: [facility];  Service: Cardiovascular;  Laterality: N/A;    SOCIAL HISTORY:   Social History   Tobacco Use  . Smoking status: Former Smoker    Years: 20.00  . Smokeless tobacco: Never Used  Substance Use Topics  . Alcohol use: Never    FAMILY HISTORY:  History reviewed. No pertinent family history.  No pertinent familial diseases.  DRUG ALLERGIES:  No Known Allergies  REVIEW OF SYSTEMS:   ROS As per history  of present illness. All pertinent systems were reviewed above. Constitutional, HEENT, cardiovascular, respiratory, GI, GU, musculoskeletal, neuro, psychiatric, endocrine, integumentary and hematologic systems were reviewed and are otherwise negative/unremarkable except for positive findings mentioned above in the HPI.   MEDICATIONS AT HOME:   Prior to Admission medications   Medication Sig Start Date End  Date Taking? Authorizing Provider  carvedilol (COREG) 12.5 MG tablet Take 12.5 mg by mouth 2 (two) times daily with a meal. 03/31/18   [provider]  citalopram (CELEXA) 20 MG tablet Take 20 mg by mouth daily.    [provider]  ergocalciferol (DRISDOL) 1.25 MG (50000 UT) capsule Take by mouth. Administered at analysis 02/02/18   [provider]  ferric citrate (AURYXIA) 1 GM 210 MG(Fe) tablet Take by mouth.  04/29/19   [provider]  hydrALAZINE (APRESOLINE) 25 MG tablet Take 1 tablet (25 mg total) by mouth every 8 (eight) hours. 05/17/19   Nolberto Hanlon, MD  lidocaine (LIDODERM) 5 % Place 1 patch onto the skin every 12 (twelve) hours. Remove & Discard patch within 12 hours or as directed by MD 11/10/19 11/09/20  Laban Emperor, PA-C  linagliptin (TRADJENTA) 5 MG TABS tablet Take 1 tablet (5 mg total) by mouth daily. 01/09/18   Loletha Grayer, MD  meloxicam (MOBIC) 7.5 MG tablet Take 7.5 mg by mouth daily. 11/06/19   [provider]  Methoxy PEG-Epoetin Beta (MIRCERA IJ) Mircera 01/08/19 03/03/20  [provider]  tiZANidine (ZANAFLEX) 2 MG tablet Take 2 mg by mouth 3 (three) times daily as needed. 11/06/19   [provider]      VITAL SIGNS:  Blood pressure (!) 178/80, pulse 97, temperature 98.2 F (36.8 C), temperature source Oral, resp. rate 12, weight 104.7 kg, SpO2 100 %.  PHYSICAL EXAMINATION:  Physical Exam  GENERAL:  64 y.o.-year-old African-American male patient lying in the bed with mild respiratory distress on BiPAP. EYES: Pupils equal, round, reactive to light and accommodation. No scleral icterus. Extraocular muscles intact.  HEENT: Head atraumatic, normocephalic. Oropharynx and nasopharynx clear.  NECK:  Supple, no jugular venous distention. No thyroid enlargement, no tenderness.  LUNGS: Diffuse occasional expiratory wheezes with tight expiratory airflow and harsh vesicular breathing with diminished bibasal breath  sounds. CARDIOVASCULAR: Regular rate and rhythm, S1, S2 normal. No murmurs, rubs, or gallops.  ABDOMEN: Soft, nondistended, nontender. Bowel sounds present. No organomegaly or mass.  EXTREMITIES: 1+ bilateral lower extremity pitting edema with cyanosis, or clubbing.  NEUROLOGIC: Cranial nerves II through XII are intact. Muscle strength 5/5 in all extremities. Sensation intact. Gait not checked.  PSYCHIATRIC: The patient is alert and oriented x 3.  Normal affect and good eye contact. SKIN: No obvious rash, lesion, or ulcer.   LABORATORY PANEL:   CBC Recent Labs  Lab 12/05/19 0402  WBC 16.4*  HGB 9.8*  HCT 29.5*  PLT 368   ------------------------------------------------------------------------------------------------------------------  Chemistries  Recent Labs  Lab 12/05/19 0402  NA 135  K 5.2*  CL 101  CO2 21*  GLUCOSE 117*  BUN 59*  CREATININE 8.31*  CALCIUM 8.5*  MG 2.2  AST 26  ALT 26  ALKPHOS 105  BILITOT 1.1   ------------------------------------------------------------------------------------------------------------------  Cardiac Enzymes No results for input(s): TROPONINI in the last 168 hours. ------------------------------------------------------------------------------------------------------------------  RADIOLOGY:  DG Chest Port 1 View  Result Date: 12/05/2019 CLINICAL DATA:  Dyspnea, congestive heart failure EXAM: PORTABLE CHEST 1 VIEW COMPARISON:  06/29/2019 FINDINGS: Lungs are clear. No pneumothorax or pleural effusion. Left internal jugular  hemodialysis catheter tip is seen at the superior right atrium. Cardiac size within normal limits. Pulmonary vascularity is normal. No acute bone abnormality. IMPRESSION: No active disease. Electronically Signed   By: Fidela Salisbury MD   On: 12/05/2019 04:29      IMPRESSION AND PLAN:   1.  Acute respiratory failure with hypoxia and possibly hypercarbia, secondary to COPD exacerbation and possibly mild acute on  chronic diastolic CHF in the setting of fluid overload with end-stage renal disease on hemodialysis with mild hyperkalemia. -The patient will be admitted to a progressive unit bed. -He will be placed on IV Lasix for pulmonary congestion. -We will continue steroid therapy with IV Solu-Medrol bronchodilator therapy with DuoNebs 4 times daily and every 4 hours as needed. -We will place him on antibiotic therapy with IV Rocephin and Zithromax. -Mucolytic therapy will be provided. -We will continue on BiPAP. -Nephrology consultation will be obtained for follow-up. -I notified Dr. Candiss Norse about the patient. -We will follow ABGs.  2.  Hypertensive urgency. -The patient was placed on IV nitroglycerin drip. -We added as needed IV labetalol and hydralazine. -We will continue his p.o. Coreg and hydralazine.  3.  Depression. -We will continue Celexa.  4.  DVT prophylaxis. -Subcutaneous heparin.    All the records are reviewed and case discussed with ED provider. The plan of care was discussed in details with the patient (and family). I answered all questions. The patient agreed to proceed with the above mentioned plan. Further management will depend upon hospital course.   CODE STATUS: Full code  Status is: Inpatient  Remains inpatient appropriate because:Ongoing diagnostic testing needed not appropriate for outpatient work up, Unsafe d/c plan, IV treatments appropriate due to intensity of illness or inability to take PO and Inpatient level of care appropriate due to severity of illness   Dispo: The patient is from: Home              Anticipated d/c is to: Home              Anticipated d/c date is: 2 days              Patient currently is not medically stable to d/c.    TOTAL TIME TAKING CARE OF THIS PATIENT: 55 minutes.    Christel Mormon M.D on 12/05/2019 at 6:04 AM  [facility]   From 7 PM-7 AM, contact night-coverage [email or web address]  CC: Primary care physician; Marguerita Merles, MD

## 2019-12-05 NOTE — ED Notes (Signed)
Patient continues to sleep soundly. Blood pressure under better control at this time after IV meds. Will continue to monitor.

## 2019-12-05 NOTE — ED Notes (Signed)
Assumed care of pt at 2300. Pt resting with eyes closed, awakens to RN calling name. Pt drowsy to answer RN questions, denies complaints. O2 sat 100% on 3L [place] with breathing regular and unlabored. Awaiting inpatient ready bed. Pt aware of plan of care and need for UA. Lights dimmed for pt comfort, TV on. Pt back to sleep prior to RN leaving room. Side rails up x2, call bell within reach.

## 2019-12-05 NOTE — ED Notes (Signed)
1 inch nitro paste left in place per Dr. Joycelyn Das.

## 2019-12-05 NOTE — Progress Notes (Signed)
[name], male   DOB: 1955-01-17, 64 y.o.   MRN: [id number] [facility] PROGRESS NOTE  Daniel Thomas RFF:[id number] DOB: November 21, 1955 DOA: 12/05/2019 PCP: Marguerita Merles, MD  HPI/Subjective: Patient seen this morning and was on BiPAP 30% FiO2.  He was complaining of some shortness of breath at that time.  This afternoon after dialysis he wanted to leave stating he was feeling better.  Went back for reevaluation and the patient had a fever of 103 and was more confused.  Objective: Vitals:   12/05/19 1330 12/05/19 1440  BP: (!) 174/99   Pulse:  (!) 131  Resp:  (!) 32  Temp:  (!) 103 F (39.4 C)  SpO2:  100%    Intake/Output Summary (Last 24 hours) at 12/05/2019 1449 Last data filed at 12/05/2019 1330 Gross per 24 hour  Intake --  Output 2000 ml  Net -2000 ml   Filed Weights   12/05/19 0405  Weight: 104.7 kg    ROS: Review of Systems  Respiratory: Positive for shortness of breath. Negative for cough.   Cardiovascular: Negative for chest pain.  Gastrointestinal: Negative for abdominal pain, nausea and vomiting.   Exam: Physical Exam HENT:     Head: Normocephalic.     Mouth/Throat:     Pharynx: No oropharyngeal exudate.  Eyes:     Extraocular Movements: Extraocular movements intact.     Conjunctiva/sclera: Conjunctivae normal.     Pupils: Pupils are equal, round, and reactive to light.  Cardiovascular:     Rate and Rhythm: Normal rate and regular rhythm.     Heart sounds: Normal heart sounds, S1 normal and S2 normal.  Pulmonary:     Breath sounds: Examination of the right-lower field reveals decreased breath sounds and rhonchi. Examination of the left-lower field reveals decreased breath sounds and rhonchi. Decreased breath sounds and rhonchi present. No wheezing or rales.  Abdominal:     Palpations: Abdomen is soft.     Tenderness: There is no abdominal tenderness.  Musculoskeletal:     Right lower leg: Swelling present.     Left lower leg:  Swelling present.  Skin:    General: Skin is warm.     Findings: No rash.  Neurological:     Mental Status: He is alert.     Comments: This morning more alert than this afternoon now more confused.       Data Reviewed: Basic Metabolic Panel: Recent Labs  Lab 11/30/19 1025 12/05/19 0402  NA 134* 135  K 3.9 5.2*  CL 96* 101  CO2 24 21*  GLUCOSE 142* 117*  BUN 47* 59*  CREATININE 7.37* 8.31*  CALCIUM 8.8* 8.5*  MG  --  2.2  PHOS  --  6.6*   Liver Function Tests: Recent Labs  Lab 12/05/19 0402  AST 26  ALT 26  ALKPHOS 105  BILITOT 1.1  PROT 7.9  ALBUMIN 3.9   CBC: Recent Labs  Lab 11/30/19 1025 12/05/19 0402  WBC 8.6 16.4*  NEUTROABS  --  13.3*  HGB 9.7* 9.8*  HCT 29.0* 29.5*  MCV 90.3 93.4  PLT 362 368   BNP (last 3 results) Recent Labs    12/05/19 0402  BNP 902.6*    Recent Results (from the past 240 hour(s))  Resp Panel by RT-PCR (Flu A&B, Covid) Nasopharyngeal Swab     Status: None   Collection Time: 12/05/19  4:02 AM   Specimen: Nasopharyngeal Swab; Nasopharyngeal(NP) swabs in vial transport medium  Result Value Ref Range Status   SARS Coronavirus 2 by RT PCR NEGATIVE NEGATIVE Final    Comment: (NOTE) SARS-CoV-2 target nucleic acids are NOT DETECTED.  The SARS-CoV-2 RNA is generally detectable in upper respiratory specimens during the acute phase of infection. The lowest concentration of SARS-CoV-2 viral copies this assay can detect is 138 copies/mL. A negative result does not preclude SARS-Cov-2 infection and should not be used as the sole basis for treatment or other patient management decisions. A negative result may occur with  improper specimen collection/handling, submission of specimen other than nasopharyngeal swab, presence of viral mutation(s) within the areas targeted by this assay, and inadequate number of viral copies(<138 copies/mL). A negative result must be combined with clinical observations, patient history, and  epidemiological information. The expected result is Negative.  Fact Sheet for Patients:  EntrepreneurPulse.com.au  Fact Sheet for Healthcare Providers:  IncredibleEmployment.be  This test is no t yet approved or cleared by the [place] FDA and  has been authorized for detection and/or diagnosis of SARS-CoV-2 by FDA under an Emergency Use Authorization (EUA). This EUA will remain  in effect (meaning this test can be used) for the duration of the COVID-19 declaration under Section 564(b)(1) of the Act, 21 U.S.C.section 360bbb-3(b)(1), unless the authorization is terminated  or revoked sooner.       Influenza A by PCR NEGATIVE NEGATIVE Final   Influenza B by PCR NEGATIVE NEGATIVE Final    Comment: (NOTE) The Xpert Xpress SARS-CoV-2/FLU/RSV plus assay is intended as an aid in the diagnosis of influenza from Nasopharyngeal swab specimens and should not be used as a sole basis for treatment. Nasal washings and aspirates are unacceptable for Xpert Xpress SARS-CoV-2/FLU/RSV testing.  Fact Sheet for Patients: EntrepreneurPulse.com.au  Fact Sheet for Healthcare Providers: IncredibleEmployment.be  This test is not yet approved or cleared by the [place] FDA and has been authorized for detection and/or diagnosis of SARS-CoV-2 by FDA under an Emergency Use Authorization (EUA). This EUA will remain in effect (meaning this test can be used) for the duration of the COVID-19 declaration under Section 564(b)(1) of the Act, 21 U.S.C. section 360bbb-3(b)(1), unless the authorization is terminated or revoked.  Performed at [facility], [address] [place], [place] [address]   Culture, blood (Routine X 2) w Reflex to ID Panel     Status: None (Preliminary result)   Collection Time: 12/05/19  6:26 AM   Specimen: BLOOD  Result Value Ref Range Status   Specimen Description BLOOD LEFT ANTECUBITAL   Final   Special Requests   Final    BOTTLES DRAWN AEROBIC AND ANAEROBIC Blood Culture results may not be optimal due to an excessive volume of blood received in culture bottles   Culture   Final    NO GROWTH <12 HOURS Performed at [facility], [address] [place], [place] [address]    Report Status PENDING  Incomplete     Studies: CT ANGIO CHEST PE W OR WO CONTRAST  Result Date: 12/05/2019 CLINICAL DATA:  Shortness of breath EXAM: CT ANGIOGRAPHY CHEST WITH CONTRAST TECHNIQUE: Multidetector CT imaging of the chest was performed using the standard protocol during bolus administration of intravenous contrast. Multiplanar CT image reconstructions and MIPs were obtained to evaluate the vascular anatomy. CONTRAST:  12mL OMNIPAQUE IOHEXOL 350 MG/ML SOLN COMPARISON:  Chest radiograph December 05, 2019 FINDINGS: Cardiovascular: There is no evident pulmonary embolus. There is no thoracic aortic aneurysm or dissection. There are scattered foci of calcification  in proximal visualized great vessels. There are foci of aortic atherosclerosis. There are foci of coronary artery calcification at several sites. There is no pericardial effusion or pericardial thickening evident. Mediastinum/Nodes: There is a mass in the left lobe of the thyroid measuring 1.7 x 1.6 cm. Thyroid appears somewhat inhomogeneous elsewhere. There are scattered subcentimeter mediastinal lymph nodes. There is a subcarinal lymph node measuring 1.4 x 1.3 cm. No other adenopathy by size criteria evident in the thoracic region. Note that there are several upper normal in size axillary lymph nodes bilaterally. There is a small hiatal hernia. Lungs/Pleura: There is mild bibasilar lung atelectatic change. There is no evident edema or airspace opacity. No appreciable pleural effusions. Upper Abdomen: There is cholelithiasis. There is upper abdominal aortic atherosclerosis. Stomach mildly distended with food material. Visualized upper  abdominal structures otherwise appear unremarkable. Musculoskeletal: Superior endplate fracture at T9. Osteoarthritic change noted in the thoracic spine with diffuse idiopathic skeletal hyperostosis in the lower thoracic region. No blastic or lytic bone lesions. No chest wall lesions. Review of the MIP images confirms the above findings. IMPRESSION: 1. No demonstrable pulmonary embolus. No thoracic aortic aneurysm or dissection. There is aortic atherosclerosis as well as foci of great vessel and coronary artery calcification. 2.  No edema or airspace opacity.  Bibasilar atelectasis. 3. 1.4 x 1.3 cm subcarinal lymph node. No other adenopathy by size criteria. Etiology for this lymph node enlargement uncertain. 4. Mass left lobe thyroid measuring 1.7 x 1.6 cm. Recommend thyroid US per consensus guidelines. (Ref: J Am Coll Radiol. 2015 Feb;12(2): 143-50).Thyroid elsewhere appears somewhat inhomogeneous. 5.  Small hiatal hernia. 6.  Cholelithiasis. 7.  Diffuse idiopathic skeletal hyperostosis in the thoracic spine. Aortic Atherosclerosis (ICD10-I70.0). Electronically Signed   By: Lowella Grip III M.D.   On: 12/05/2019 10:29   DG Chest Port 1 View  Result Date: 12/05/2019 CLINICAL DATA:  Dyspnea, congestive heart failure EXAM: PORTABLE CHEST 1 VIEW COMPARISON:  06/29/2019 FINDINGS: Lungs are clear. No pneumothorax or pleural effusion. Left internal jugular hemodialysis catheter tip is seen at the superior right atrium. Cardiac size within normal limits. Pulmonary vascularity is normal. No acute bone abnormality. IMPRESSION: No active disease. Electronically Signed   By: Fidela Salisbury MD   On: 12/05/2019 04:29    Scheduled Meds: . amLODipine  10 mg Oral QHS  . carvedilol  12.5 mg Oral BID WC  . Chlorhexidine Gluconate Cloth  6 each Topical Q0600  . citalopram  20 mg Oral Daily  . epoetin (EPOGEN/PROCRIT) injection  4,000 Units Intravenous Q T,Th,Sa-HD  . ergocalciferol  50,000 Units Oral UD  . ferric  citrate  210 mg Oral UD  . furosemide  60 mg Intravenous Q12H  . heparin  5,000 Units Subcutaneous Q8H  . hydrALAZINE  25 mg Oral Q8H  . lidocaine  1 patch Transdermal Q12H  . linagliptin  5 mg Oral Daily  . losartan  50 mg Oral QHS  . methylPREDNISolone (SOLU-MEDROL) injection  40 mg Intravenous Q6H   Followed by  . [START ON 12/06/2019] predniSONE  40 mg Oral Q breakfast  . sodium chloride flush  3 mL Intravenous Q12H   Continuous Infusions: . sodium chloride    . cefTRIAXone (ROCEPHIN)  IV      Assessment/Plan:  1. Acute hypoxic respiratory failure secondary to COPD exacerbation and possible fluid overload.  Patient started on steroids and antibiotic.  I ordered a CT scan of the chest which was negative for pulmonary embolism.  Patient had  dialysis today.  He was switched over from BiPAP this morning and now on nasal cannula.  ABG this morning showed a pH of 7.30, PCO2 of 39 and PO2 of 44. 2. Systemic inflammatory response syndrome.  Fever this afternoon of 103 and tachycardic and leukocytosis.  Follow-up blood cultures.  Add vancomycin since he does have a dialysis catheter in his left chest.  Continue Rocephin. 3. COPD exacerbation on Solu-Medrol and nebulizer treatments 4. Acute on chronic diastolic congestive heart failure dialysis to manage fluid.  Patient started on IV Lasix by admitting physician. 5. End-stage renal disease on hemodialysis as per nephrology had dialysis today. 6. Elevated PSA was supposed to have an outpatient MRI of the prostate. 7. 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  Will check thyroid function tomorrow morning.  Will likely end up needing an outpatient biopsy of this also. 8. Hypertensive urgency initially placed on nitroglycerin drip off nitroglycerin drip now.  Can give the Coreg that already ordered and hydralazine.  Nephrology added Norvasc and losartan. 9. Depression on Celexa     Code Status:     Code Status Orders  (From admission, onward)          Start     Ordered   12/05/19 0602  Full code  Continuous        12/05/19 0604        Code Status History    Date Active Date Inactive Code Status Order ID Comments User Context   05/12/2019 2331 05/18/2019 0014 Full Code [id number]  Elwyn Reach, MD Inpatient   04/17/2018 1436 04/18/2018 1611 Full Code [id number]  Saundra Shelling, MD Inpatient   01/12/2018 1658 01/16/2018 2207 Full Code [id number]  Bettey Costa, MD Inpatient   01/06/2018 2228 01/08/2018 1455 Full Code [id number]  Salary, Avel Peace, MD ED   Advance Care Planning Activity     Family Communication: Spoke with brother at the bedside this morning Disposition Plan: Status is: Inpatient  Dispo: The patient is from: Home              Anticipated d/c is to: Home              Anticipated d/c date is: With fever of 103 likely will need a few days of IV antibiotics.              Patient currently not medically stable for discharge.  Consultants:  Nephrology  Antibiotics:  Vancomycin  Rocephin  Time spent: 28 minutes  [place]

## 2019-12-06 ENCOUNTER — Encounter
Admission: EM | Disposition: A | Discharge status: Home / Self Care | Payer: Self-pay | Source: Home / Self Care | Attending: Internal Medicine

## 2019-12-06 ENCOUNTER — Inpatient Hospital Stay: Payer: Medicare Other

## 2019-12-06 ENCOUNTER — Encounter: Payer: Self-pay | Admitting: Vascular Surgery

## 2019-12-06 DIAGNOSIS — Z992 Dependence on renal dialysis: Secondary | ICD-10-CM

## 2019-12-06 DIAGNOSIS — I10 Essential (primary) hypertension: Secondary | ICD-10-CM

## 2019-12-06 DIAGNOSIS — J9601 Acute respiratory failure with hypoxia: Secondary | ICD-10-CM | POA: Diagnosis not present

## 2019-12-06 DIAGNOSIS — R6520 Severe sepsis without septic shock: Secondary | ICD-10-CM

## 2019-12-06 DIAGNOSIS — I5033 Acute on chronic diastolic (congestive) heart failure: Secondary | ICD-10-CM | POA: Diagnosis not present

## 2019-12-06 DIAGNOSIS — A419 Sepsis, unspecified organism: Secondary | ICD-10-CM

## 2019-12-06 DIAGNOSIS — J441 Chronic obstructive pulmonary disease with (acute) exacerbation: Secondary | ICD-10-CM | POA: Diagnosis not present

## 2019-12-06 DIAGNOSIS — N186 End stage renal disease: Secondary | ICD-10-CM

## 2019-12-06 HISTORY — PX: DIALYSIS/PERMA CATHETER REMOVAL: CATH118289

## 2019-12-06 LAB — BASIC METABOLIC PANEL
Anion gap: 16 — ABNORMAL HIGH (ref 5–15)
BUN: 43 mg/dL — ABNORMAL HIGH (ref 8–23)
CO2: 22 mmol/L (ref 22–32)
Calcium: 8.6 mg/dL — ABNORMAL LOW (ref 8.9–10.3)
Chloride: 97 mmol/L — ABNORMAL LOW (ref 98–111)
Creatinine, Ser: 6.37 mg/dL — ABNORMAL HIGH (ref 0.61–1.24)
GFR, Estimated: 9 mL/min — ABNORMAL LOW (ref >60)
Glucose, Bld: 156 mg/dL — ABNORMAL HIGH (ref 70–99)
Potassium: 4.2 mmol/L (ref 3.5–5.1)
Sodium: 135 mmol/L (ref 135–145)

## 2019-12-06 LAB — CBC
HCT: 25.4 % — ABNORMAL LOW (ref 39.0–52.0)
Hemoglobin: 8.3 g/dL — ABNORMAL LOW (ref 13.0–17.0)
MCH: 30.6 pg (ref 26.0–34.0)
MCHC: 32.7 g/dL (ref 30.0–36.0)
MCV: 93.7 fL (ref 80.0–100.0)
Platelets: 306 10*3/uL (ref 150–400)
RBC: 2.71 MIL/uL — ABNORMAL LOW (ref 4.22–5.81)
RDW: 15.9 % — ABNORMAL HIGH (ref 11.5–15.5)
WBC: 32.8 10*3/uL — ABNORMAL HIGH (ref 4.0–10.5)
nRBC: 0 % (ref 0.0–0.2)

## 2019-12-06 LAB — TSH: TSH: 0.878 u[IU]/mL (ref 0.350–4.500)

## 2019-12-06 LAB — HEMOGLOBIN A1C
Hgb A1c MFr Bld: 6.2 % — ABNORMAL HIGH (ref 4.8–5.6)
Mean Plasma Glucose: 131.24 mg/dL

## 2019-12-06 LAB — T4, FREE: Free T4: 0.73 ng/dL (ref 0.61–1.12)

## 2019-12-06 SURGERY — DIALYSIS/PERMA CATHETER REMOVAL
Anesthesia: LOCAL

## 2019-12-06 MED ORDER — LIDOCAINE HCL (PF) 1 % IJ SOLN
INTRAMUSCULAR | Status: DC | PRN
  Administered 2019-12-06: 1 mL

## 2019-12-06 MED ORDER — FLEET ENEMA 7-19 GM/118ML RE ENEM
1.0000 | ENEMA | Freq: Once | RECTAL | Status: AC | PRN
  Administered 2019-12-06: 1 via RECTAL

## 2019-12-06 SURGICAL SUPPLY — 2 items
FORCEPS HALSTEAD CVD 5IN STRL (INSTRUMENTS) ×2 IMPLANT
TRAY LACERAT/PLASTIC (MISCELLANEOUS) ×2 IMPLANT

## 2019-12-06 NOTE — ED Notes (Signed)
Admitting MD at bedside.

## 2019-12-06 NOTE — Progress Notes (Signed)
[facility], [place] 12/06/19  Subjective:   LOS: 1 Patient known to our practice from previous admissions Presents to ER for shortness of breath that started in the middle of the night He did not go to his dialysis treatment but came to the ER for evaluation Last HD was this past Tuesday Upon presentation, blood pressure was 222/102 Patient was given Nitropaste and placed on BiPAP. Patient was urgently dialyzed on December 2, 2 L of fluid was removed. Post dialysis, Patient experienced confusion and had a temperature of 103. PermCath infection was suspected. Vascular surgery was consulted and PermCath was removed today Patient feels well at present Denies any acute shortness of breath    Objective:  Vital signs in last 24 hours:  Temp:  [99.9 F (37.7 C)-103 F (39.4 C)] 99.9 F (37.7 C) (12/02 1900) Pulse Rate:  [85-132] 98 (12/03 1235) Resp:  [11-32] 18 (12/03 1235) BP: (107-170)/(61-106) 158/80 (12/03 1235) SpO2:  [98 %-100 %] 100 % (12/03 1235)  Weight change:  Filed Weights   12/05/19 0405  Weight: 104.7 kg    Intake/Output:   No intake or output data in the 24 hours ending 12/06/19 1427  Physical Exam: General:  No acute distress, laying in the bed  HEENT  anicteric, moist oral mucous membrane  Pulm/lungs  normal respiratory effort, mild scattered rhonchi  CVS/Heart  regular rhythm, no rub or gallop  Abdomen:   Soft, nontender  Extremities:  No peripheral edema  Neurologic:  Alert, oriented, able to follow commands  Skin:  Rash around catheter exit site      Basic Metabolic Panel:  Recent Labs  Lab 11/30/19 1025 12/05/19 0402 12/06/19 0357  NA 134* 135 135  K 3.9 5.2* 4.2  CL 96* 101 97*  CO2 24 21* 22  GLUCOSE 142* 117* 156*  BUN 47* 59* 43*  CREATININE 7.37* 8.31* 6.37*  CALCIUM 8.8* 8.5* 8.6*  MG  --  2.2  --   PHOS  --  6.6*  --      CBC: Recent Labs  Lab 11/30/19 1025 12/05/19 0402 12/06/19 0357  WBC  8.6 16.4* 32.8*  NEUTROABS  --  13.3*  --   HGB 9.7* 9.8* 8.3*  HCT 29.0* 29.5* 25.4*  MCV 90.3 93.4 93.7  PLT 362 368 306      Lab Results  Component Value Date   HEPBSAG NON REACTIVE 05/17/2019   HEPBSAB Non Reactive 01/12/2018   HEPBIGM Negative 01/12/2018      Microbiology:  Recent Results (from the past 240 hour(s))  Resp Panel by RT-PCR (Flu A&B, Covid) Nasopharyngeal Swab     Status: None   Collection Time: 12/05/19  4:02 AM   Specimen: Nasopharyngeal Swab; Nasopharyngeal(NP) swabs in vial transport medium  Result Value Ref Range Status   SARS Coronavirus 2 by RT PCR NEGATIVE NEGATIVE Final    Comment: (NOTE) SARS-CoV-2 target nucleic acids are NOT DETECTED.  The SARS-CoV-2 RNA is generally detectable in upper respiratory specimens during the acute phase of infection. The lowest concentration of SARS-CoV-2 viral copies this assay can detect is 138 copies/mL. A negative result does not preclude SARS-Cov-2 infection and should not be used as the sole basis for treatment or other patient management decisions. A negative result may occur with  improper specimen collection/handling, submission of specimen other than nasopharyngeal swab, presence of viral mutation(s) within the areas targeted by this assay, and inadequate number of viral copies(<138 copies/mL). A negative result must be  combined with clinical observations, patient history, and epidemiological information. The expected result is Negative.  Fact Sheet for Patients:  EntrepreneurPulse.com.au  Fact Sheet for Healthcare Providers:  IncredibleEmployment.be  This test is no t yet approved or cleared by the [place] FDA and  has been authorized for detection and/or diagnosis of SARS-CoV-2 by FDA under an Emergency Use Authorization (EUA). This EUA will remain  in effect (meaning this test can be used) for the duration of the COVID-19 declaration under Section  564(b)(1) of the Act, 21 U.S.C.section 360bbb-3(b)(1), unless the authorization is terminated  or revoked sooner.       Influenza A by PCR NEGATIVE NEGATIVE Final   Influenza B by PCR NEGATIVE NEGATIVE Final    Comment: (NOTE) The Xpert Xpress SARS-CoV-2/FLU/RSV plus assay is intended as an aid in the diagnosis of influenza from Nasopharyngeal swab specimens and should not be used as a sole basis for treatment. Nasal washings and aspirates are unacceptable for Xpert Xpress SARS-CoV-2/FLU/RSV testing.  Fact Sheet for Patients: EntrepreneurPulse.com.au  Fact Sheet for Healthcare Providers: IncredibleEmployment.be  This test is not yet approved or cleared by the [place] FDA and has been authorized for detection and/or diagnosis of SARS-CoV-2 by FDA under an Emergency Use Authorization (EUA). This EUA will remain in effect (meaning this test can be used) for the duration of the COVID-19 declaration under Section 564(b)(1) of the Act, 21 U.S.C. section 360bbb-3(b)(1), unless the authorization is terminated or revoked.  Performed at [facility], [address] [place], [place] [address]   Culture, blood (Routine X 2) w Reflex to ID Panel     Status: None (Preliminary result)   Collection Time: 12/05/19  6:26 AM   Specimen: BLOOD  Result Value Ref Range Status   Specimen Description BLOOD LEFT ANTECUBITAL  Final   Special Requests   Final    BOTTLES DRAWN AEROBIC AND ANAEROBIC Blood Culture results may not be optimal due to an excessive volume of blood received in culture bottles   Culture   Final    NO GROWTH 1 DAY Performed at [facility], [address] [place], [place] [address]    Report Status PENDING  Incomplete  Culture, blood (Routine X 2) w Reflex to ID Panel     Status: None (Preliminary result)   Collection Time: 12/05/19  6:26 AM   Specimen: BLOOD  Result Value Ref Range Status   Specimen  Description BLOOD RIGHT ANTECUBITAL  Final   Special Requests   Final    BOTTLES DRAWN AEROBIC AND ANAEROBIC Blood Culture results may not be optimal due to an excessive volume of blood received in culture bottles   Culture   Final    NO GROWTH 1 DAY Performed at [facility], [address] [place], [place] [address]    Report Status PENDING  Incomplete    Coagulation Studies: Recent Labs    12/05/19 0402  LABPROT 13.8  INR 1.1    Urinalysis: No results for input(s): COLORURINE, LABSPEC, PHURINE, GLUCOSEU, HGBUR, BILIRUBINUR, KETONESUR, PROTEINUR, UROBILINOGEN, NITRITE, LEUKOCYTESUR in the last 72 hours.  Invalid input(s): APPERANCEUR    Imaging: CT ANGIO CHEST PE W OR WO CONTRAST  Result Date: 12/05/2019 CLINICAL DATA:  Shortness of breath EXAM: CT ANGIOGRAPHY CHEST WITH CONTRAST TECHNIQUE: Multidetector CT imaging of the chest was performed using the standard protocol during bolus administration of intravenous contrast. Multiplanar CT image reconstructions and MIPs were obtained to evaluate the vascular anatomy. CONTRAST:  46mL  OMNIPAQUE IOHEXOL 350 MG/ML SOLN COMPARISON:  Chest radiograph December 05, 2019 FINDINGS: Cardiovascular: There is no evident pulmonary embolus. There is no thoracic aortic aneurysm or dissection. There are scattered foci of calcification in proximal visualized great vessels. There are foci of aortic atherosclerosis. There are foci of coronary artery calcification at several sites. There is no pericardial effusion or pericardial thickening evident. Mediastinum/Nodes: There is a mass in the left lobe of the thyroid measuring 1.7 x 1.6 cm. Thyroid appears somewhat inhomogeneous elsewhere. There are scattered subcentimeter mediastinal lymph nodes. There is a subcarinal lymph node measuring 1.4 x 1.3 cm. No other adenopathy by size criteria evident in the thoracic region. Note that there are several upper normal in size axillary lymph nodes bilaterally.  There is a small hiatal hernia. Lungs/Pleura: There is mild bibasilar lung atelectatic change. There is no evident edema or airspace opacity. No appreciable pleural effusions. Upper Abdomen: There is cholelithiasis. There is upper abdominal aortic atherosclerosis. Stomach mildly distended with food material. Visualized upper abdominal structures otherwise appear unremarkable. Musculoskeletal: Superior endplate fracture at T9. Osteoarthritic change noted in the thoracic spine with diffuse idiopathic skeletal hyperostosis in the lower thoracic region. No blastic or lytic bone lesions. No chest wall lesions. Review of the MIP images confirms the above findings. IMPRESSION: 1. No demonstrable pulmonary embolus. No thoracic aortic aneurysm or dissection. There is aortic atherosclerosis as well as foci of great vessel and coronary artery calcification. 2.  No edema or airspace opacity.  Bibasilar atelectasis. 3. 1.4 x 1.3 cm subcarinal lymph node. No other adenopathy by size criteria. Etiology for this lymph node enlargement uncertain. 4. Mass left lobe thyroid measuring 1.7 x 1.6 cm. Recommend thyroid US per consensus guidelines. (Ref: J Am Coll Radiol. 2015 Feb;12(2): 143-50).Thyroid elsewhere appears somewhat inhomogeneous. 5.  Small hiatal hernia. 6.  Cholelithiasis. 7.  Diffuse idiopathic skeletal hyperostosis in the thoracic spine. Aortic Atherosclerosis (ICD10-I70.0). Electronically Signed   By: Lowella Grip III M.D.   On: 12/05/2019 10:29   DG Chest Port 1 View  Result Date: 12/05/2019 CLINICAL DATA:  Dyspnea, congestive heart failure EXAM: PORTABLE CHEST 1 VIEW COMPARISON:  06/29/2019 FINDINGS: Lungs are clear. No pneumothorax or pleural effusion. Left internal jugular hemodialysis catheter tip is seen at the superior right atrium. Cardiac size within normal limits. Pulmonary vascularity is normal. No acute bone abnormality. IMPRESSION: No active disease. Electronically Signed   By: Fidela Salisbury MD    On: 12/05/2019 04:29     Medications:   . sodium chloride    . cefTRIAXone (ROCEPHIN)  IV Stopped (12/05/19 1628)   . carvedilol  12.5 mg Oral BID WC  . Chlorhexidine Gluconate Cloth  6 each Topical Q0600  . citalopram  20 mg Oral Daily  . epoetin (EPOGEN/PROCRIT) injection  4,000 Units Intravenous Q T,Th,Sa-HD  . ferric citrate  210 mg Oral TID AC  . heparin  5,000 Units Subcutaneous Q8H  . lidocaine  1 patch Transdermal Q12H  . linagliptin  5 mg Oral Daily  . sodium chloride flush  3 mL Intravenous Q12H  . [START ON 12/10/2019] Vitamin D (Ergocalciferol)  50,000 Units Oral UD   sodium chloride, acetaminophen **OR** acetaminophen, hydrALAZINE, metoprolol tartrate, ondansetron **OR** ondansetron (ZOFRAN) IV, sodium chloride flush, sodium phosphate, tiZANidine, traZODone  Assessment/ Plan:  64 y.o. male with COPD, diabetes mellitus type 2, BPH, prostate cancer followed bu urology hypertension, hyperlipidemia, ESRD on HD TTS, anemia of chronic kidney disease, secondary hyperparathyroidism  was admitted on 12/05/2019 for  Active Problems:   Hypertensive emergency   Severe sepsis (HCC)   COPD exacerbation (HCC)   SIRS (systemic inflammatory response syndrome) (HCC)  Hyperkalemia [E87.5] COPD exacerbation (HCC) [J44.1] SIRS (systemic inflammatory response syndrome) (HCC) [R65.10] Hypertensive emergency [I16.1] Acute respiratory failure, unspecified whether with hypoxia or hypercapnia ([place]) [J96.00] Chronic kidney disease with end stage renal failure on dialysis ([place]) [N18.6, Z99.2] Acute on chronic congestive heart failure, unspecified heart failure type ([place]) [I50.9]  [facility] [address]/TTS Left IJ PermCath  #. ESRD #Shortness of breath, hypertensive emergency (malignant HTN)  # Fever Shortness of breath and hypertensive emergency resolved with control of blood pressure. 2 L of volume was removed with hemodialysis on December 2. Postdialysis, patient had a fever.  PermCath infection was suspected. PermCath was then removed with the assistance of vascular surgery on December 3. Catheter tip appeared infected and was sent for culture. Patient is currently getting empiric treatment with vancomycin and Rocephin.  At present, potassium, electrolytes and volume status are acceptable. No acute indication for dialysis. Plan for PermCath placement early next week provided his blood cultures remain negative If patient needs dialysis emergently over the weekend, may need a temporary catheter.  #. Anemia of CKD  Lab Results  Component Value Date   HGB 8.3 (L) 12/06/2019   Low dose EPO with HD Was getting Micera as outpatient  #. Secondary hyperparathyroidism of renal origin N 25.81      Component Value Date/Time   PTH 138 (H) 05/17/2019 0336   Lab Results  Component Value Date   PHOS 6.6 (H) 12/05/2019   Monitor calcium and phos level during this admission   #. Diabetes type 2 with CKD Hgb A1c MFr Bld (%)  Date Value  12/06/2019 6.2 (H)  repeat HbA1c    LOS: 1 Daniel Thomas 12/3/20212:27 PM  [place], [place]

## 2019-12-06 NOTE — Op Note (Signed)
Operative Note   Preoperative diagnosis:    1. ESRD with sepsis  Postoperative diagnosis:   1. ESRD with sepsis  Procedure:  Removal of LEFT Permcath  Physician Assistant: Hezzie Bump PA-C  Surgeon:  Leotis Pain, MD  Anesthesia:  Local  EBL:  Minimal  Indication for the Procedure:  The patient has a functional permanent dialysis access and no longer needs their permcath.  This can be removed.  Risks and benefits are discussed and informed consent is obtained.  Description of the Procedure:  The patient's LEFT neck, chest and existing catheter were sterilely prepped and draped.  Using gentle pressure the catheter was removed.  Clearly infected.  Tip was sent for microbiology.  Pressure was held after the catheter was removed.  A sterile dressing was placed to the area. The patient tolerated the procedure well. Marland Kitchen  Daniel Thomas  12/06/2019, 12:32 PM  This note was created with Dragon Medical transcription system. Any errors in dictation are purely unintentional.

## 2019-12-06 NOTE — H&P (View-Only) (Signed)
[facility] Admission History & Physical  MRN : [id number]  Daniel Thomas is a 64 y.o. (06-Oct-1955) male who presents with chief complaint of  Chief Complaint  Patient presents with  . Shortness of Breath  . Respiratory Distress   History of Present Illness:  I am asked to evaluate the patient by nephrology (Dr. Candiss Norse). Was sent to the emergency department by dialysis staff due to "respiratory distress".  Patient had PermCath placed approximately 2 months ago. Patient denies pain or tenderness overlying the access.  There is no pain with dialysis. No fevers or chills while on dialysis.  Admitted for acute respiratory failure with hypoxia secondary to COPD exacerbation and possibly mild acute on chronic diastolic congestive heart failure in setting of fluid overload with end-stage renal disease on hemodialysis.    Asked to remove PermCath as it may be contributing to the patient's respiratory failure with possible sepsis.  Current Facility-Administered Medications  Medication Dose Route Frequency Provider Last Rate Last Admin  . 0.9 %  sodium chloride infusion  250 mL Intravenous PRN Mansy, Jan A, MD      . acetaminophen (TYLENOL) tablet 650 mg  650 mg Oral Q6H PRN Mansy, Jan A, MD   650 mg at 12/05/19 1450   Or  . acetaminophen (TYLENOL) suppository 650 mg  650 mg Rectal Q6H PRN Mansy, Jan A, MD      . carvedilol (COREG) tablet 12.5 mg  12.5 mg Oral BID WC Mansy, Jan A, MD   12.5 mg at 12/06/19 0842  . cefTRIAXone (ROCEPHIN) 1 g in sodium chloride 0.9 % 100 mL IVPB  1 g Intravenous Q24H Mansy, Arvella Merles, MD   Stopped at 12/05/19 1628  . Chlorhexidine Gluconate Cloth 2 % PADS 6 each  6 each Topical Q0600 Murlean Iba, MD      . citalopram (CELEXA) tablet 20 mg  20 mg Oral Daily Mansy, Jan A, MD   20 mg at 12/06/19 1049  . epoetin alfa (EPOGEN) injection 4,000 Units  4,000 Units Intravenous Q T,Th,Sa-HD Murlean Iba, MD   4,000 Units at 12/05/19 1252  . ferric  citrate (AURYXIA) tablet 210 mg  210 mg Oral TID AC Mansy, Jan A, MD   210 mg at 12/06/19 1122  . heparin injection 5,000 Units  5,000 Units Subcutaneous Q8H Mansy, Arvella Merles, MD   5,000 Units at 12/06/19 [phone]  . hydrALAZINE (APRESOLINE) injection 10 mg  10 mg Intravenous Q6H PRN Mansy, Jan A, MD   10 mg at 12/05/19 0644  . lidocaine (LIDODERM) 5 % 1 patch  1 patch Transdermal Q12H Mansy, Jan A, MD   1 patch at 12/06/19 1049  . linagliptin (TRADJENTA) tablet 5 mg  5 mg Oral Daily Mansy, Jan A, MD   5 mg at 12/06/19 1049  . methylPREDNISolone sodium succinate (SOLU-MEDROL) 40 mg/mL injection 40 mg  40 mg Intravenous Q12H Loletha Grayer, MD   40 mg at 12/06/19 0357   Followed by  . [START ON 12/07/2019] predniSONE (DELTASONE) tablet 40 mg  40 mg Oral Q breakfast Wieting, Richard, MD      . metoprolol tartrate (LOPRESSOR) injection 5 mg  5 mg Intravenous Q1H PRN Loletha Grayer, MD      . ondansetron [facility]) tablet 4 mg  4 mg Oral Q6H PRN Mansy, Jan A, MD       Or  . ondansetron [facility]) injection 4 mg  4 mg Intravenous Q6H PRN Mansy, Arvella Merles, MD      .  sodium chloride flush (NS) 0.9 % injection 3 mL  3 mL Intravenous Q12H Mansy, Jan A, MD   3 mL at 12/06/19 1051  . sodium chloride flush (NS) 0.9 % injection 3 mL  3 mL Intravenous PRN Mansy, Jan A, MD      . tiZANidine (ZANAFLEX) tablet 2 mg  2 mg Oral TID PRN Mansy, Jan A, MD      . traZODone (DESYREL) tablet 25 mg  25 mg Oral QHS PRN Mansy, Arvella Merles, MD      . Derrill Memo ON 12/10/2019] Vitamin D (Ergocalciferol) (DRISDOL) capsule 50,000 Units  50,000 Units Oral UD Mansy, Arvella Merles, MD       Current Outpatient Medications  Medication Sig Dispense Refill  . albuterol (VENTOLIN HFA) 108 (90 Base) MCG/ACT inhaler Inhale 2 puffs into the lungs every 6 (six) hours as needed for wheezing or shortness of breath.    Marland Kitchen atorvastatin (LIPITOR) 40 MG tablet Take 40 mg by mouth at bedtime.    . calcium carbonate (TUMS - DOSED IN MG ELEMENTAL CALCIUM) 500 MG chewable tablet  Chew 1 tablet by mouth every 6 (six) hours as needed for indigestion.    . carvedilol (COREG) 12.5 MG tablet Take 12.5 mg by mouth 2 (two) times daily with a meal.    . citalopram (CELEXA) 20 MG tablet Take 20 mg by mouth daily.    . ergocalciferol (DRISDOL) 1.25 MG (50000 UT) capsule Take by mouth. Administered at analysis    . ferric citrate (AURYXIA) 1 GM 210 MG(Fe) tablet Take by mouth.     . hydrALAZINE (APRESOLINE) 25 MG tablet Take 1 tablet (25 mg total) by mouth every 8 (eight) hours. 90 tablet 0  . lidocaine (LIDODERM) 5 % Place 1 patch onto the skin every 12 (twelve) hours. Remove & Discard patch within 12 hours or as directed by MD 10 patch 0  . linagliptin (TRADJENTA) 5 MG TABS tablet Take 1 tablet (5 mg total) by mouth daily. 30 tablet 0  . meloxicam (MOBIC) 7.5 MG tablet Take 7.5 mg by mouth daily.    . Methoxy PEG-Epoetin Beta (MIRCERA IJ) Mircera    . tiZANidine (ZANAFLEX) 2 MG tablet Take 2 mg by mouth 3 (three) times daily as needed.    . traZODone (DESYREL) 50 MG tablet Take 50 mg by mouth at bedtime as needed for sleep.    Marland Kitchen amLODipine (NORVASC) 10 MG tablet Take 1 tablet (10 mg total) by mouth at bedtime. 30 tablet 0  . losartan (COZAAR) 50 MG tablet Take 1 tablet (50 mg total) by mouth at bedtime. 30 tablet 0  . predniSONE (DELTASONE) 20 MG tablet 2 tabs po daily for 4 days 8 tablet 0   Past Medical History:  Diagnosis Date  . COPD (chronic obstructive pulmonary disease) ([place])   . Diabetes mellitus without complication ([place])   . Enlarged prostate   . ESRD on dialysis ([place])   . Heart murmur   . High cholesterol   . Hypertension    Past Surgical History:  Procedure Laterality Date  . DIALYSIS/PERMA CATHETER INSERTION N/A 01/12/2018   Procedure: DIALYSIS/PERMA CATHETER INSERTION;  Surgeon: Katha Cabal, MD;  Location: [facility];  Service: Cardiovascular;  Laterality: N/A;  . DIALYSIS/PERMA CATHETER INSERTION N/A 05/16/2019   Procedure: DIALYSIS/PERMA  CATHETER INSERTION;  Surgeon: Algernon Huxley, MD;  Location: [place];  Service: Cardiovascular;  Laterality: N/A;  . DIALYSIS/PERMA CATHETER INSERTION N/A 10/17/2019   Procedure:  DIALYSIS/PERMA CATHETER INSERTION;  Surgeon: Algernon Huxley, MD;  Location: [place] [facility];  Service: Cardiovascular;  Laterality: N/A;  . DIALYSIS/PERMA CATHETER REMOVAL N/A 05/13/2019   Procedure: DIALYSIS/PERMA CATHETER REMOVAL;  Surgeon: Algernon Huxley, MD;  Location: [facility];  Service: Cardiovascular;  Laterality: N/A;  . TEE WITHOUT CARDIOVERSION N/A 05/15/2019   Procedure: TRANSESOPHAGEAL ECHOCARDIOGRAM (TEE);  Surgeon: Teodoro Spray, MD;  Location: [facility];  Service: Cardiovascular;  Laterality: N/A;   Social History Social History   Tobacco Use  . Smoking status: Former Smoker    Years: 20.00  . Smokeless tobacco: Never Used  Vaping Use  . Vaping Use: Never used  Substance Use Topics  . Alcohol use: Never  . Drug use: Never   Family History History reviewed. No pertinent family history.   No family history of bleeding or clotting disorders, autoimmune disease or porphyria.  No Known Allergies  REVIEW OF SYSTEMS (Negative unless checked)  Constitutional: [] Weight loss  [] Fever  [] Chills Cardiac: [] Chest pain   [] Chest pressure   [] Palpitations   [x] Shortness of breath when laying flat   [x] Shortness of breath at rest   [x] Shortness of breath with exertion. Vascular:  [] Pain in legs with walking   [] Pain in legs at rest   [] Pain in legs when laying flat   [] Claudication   [] Pain in feet when walking  [] Pain in feet at rest  [] Pain in feet when laying flat   [] History of DVT   [] Phlebitis   [] Swelling in legs   [] Varicose veins   [] Non-healing ulcers Pulmonary:   [] Uses home oxygen   [] Productive cough   [] Hemoptysis   [] Wheeze  [] COPD   [] Asthma Neurologic:  [] Dizziness  [] Blackouts   [] Seizures   [] History of stroke   [] History of TIA  [] Aphasia   [] Temporary blindness    [] Dysphagia   [] Weakness or numbness in arms   [] Weakness or numbness in legs Musculoskeletal:  [] Arthritis   [] Joint swelling   [] Joint pain   [] Low back pain Hematologic:  [] Easy bruising  [] Easy bleeding   [] Hypercoagulable state   [] Anemic  [] Hepatitis Gastrointestinal:  [] Blood in stool   [] Vomiting blood  [] Gastroesophageal reflux/heartburn   [] Difficulty swallowing. Genitourinary:  [x] Chronic kidney disease   [] Difficult urination  [] Frequent urination  [] Burning with urination   [] Blood in urine Skin:  [] Rashes   [] Ulcers   [] Wounds Psychological:  [] History of anxiety   []  History of major depression.  Physical Examination  Vitals:   12/06/19 0830 12/06/19 0900 12/06/19 1000 12/06/19 1030  BP: (!) 155/89 (!) 170/91 (!) 154/83 (!) 160/87  Pulse: 95 (!) 102 100 98  Resp: 14 18 17 15   Temp:      TempSrc:      SpO2: 100%   99%  Weight:       Body mass index is 43.61 kg/m. Gen: WD/WN, NAD Head: [place]/AT, No temporalis wasting. Prominent temp pulse not noted. Ear/Nose/Throat: Hearing grossly intact, nares w/o erythema or drainage, oropharynx w/o Erythema/Exudate,  Eyes: Conjunctiva clear, sclera non-icteric Neck: Trachea midline.  No JVD.  Pulmonary:  Good air movement, respirations not labored, no use of accessory muscles.  Cardiac: RRR, normal S1, S2. Vascular:  Vessel Right Left  Radial Palpable Palpable  Ulnar Not Palpable Not Palpable  Brachial Palpable Palpable  Carotid Palpable, without bruit Palpable, without bruit   Permcath:  Gastrointestinal: soft, non-tender/non-distended. No guarding/reflex.  Musculoskeletal: M/S 5/5 throughout.  Extremities without ischemic changes.  No  deformity or atrophy.  Neurologic: Sensation grossly intact in extremities.  Symmetrical.  Speech is fluent. Motor exam as listed above. Psychiatric: Judgment intact, Mood & affect appropriate for pt's clinical situation. Dermatologic: No rashes or ulcers noted.  No cellulitis or open  wounds. Lymph : No Cervical, Axillary, or Inguinal lymphadenopathy.  CBC Lab Results  Component Value Date   WBC 32.8 (H) 12/06/2019   HGB 8.3 (L) 12/06/2019   HCT 25.4 (L) 12/06/2019   MCV 93.7 12/06/2019   PLT 306 12/06/2019   BMET    Component Value Date/Time   NA 135 12/06/2019 0357   K 4.2 12/06/2019 0357   CL 97 (L) 12/06/2019 0357   CO2 22 12/06/2019 0357   GLUCOSE 156 (H) 12/06/2019 0357   BUN 43 (H) 12/06/2019 0357   CREATININE 6.37 (H) 12/06/2019 0357   CALCIUM 8.6 (L) 12/06/2019 0357   GFRNONAA 9 (L) 12/06/2019 0357   GFRAA 10 (L) 06/29/2019 0340   Estimated Creatinine Clearance: 12.3 mL/min (A) (by C-G formula based on SCr of 6.37 mg/dL (H)).  COAG Lab Results  Component Value Date   INR 1.1 12/05/2019   INR 1.1 05/12/2019   Radiology DG Lumbar Spine 2-3 Views  Result Date: 11/10/2019 CLINICAL DATA:  Lower back pain across to sacrum area for over 1 month, no known injury EXAM: LUMBAR SPINE - 2-3 VIEW COMPARISON:  None available; correlation is made with the report from a prior study of 07/06/2015 FINDINGS: Osseous demineralization. Five non-rib bearing lumbar vertebrae. Superior endplate compression fracture of L1 vertebral body with 50% anterior height loss, age indeterminate in appearance; an L1 compression fracture bone of unspecified degree is reported on the prior exam from 2017. Remaining lumbar vertebral body heights maintained. No additional fracture, subluxation, or bone destruction. SI joints preserved. Atherosclerotic calcifications aorta and iliac arteries. IMPRESSION: Age-indeterminate superior endplate compression fracture of L1 vertebral body. An L1 compression fracture of unspecified degree is reported on a prior exam from 2017, but the images are not available for direct comparison to know if this is stable or progressive. Electronically Signed   By: Lavonia Dana M.D.   On: 11/10/2019 13:38   CT ANGIO CHEST PE W OR WO CONTRAST  Result Date:  12/05/2019 CLINICAL DATA:  Shortness of breath EXAM: CT ANGIOGRAPHY CHEST WITH CONTRAST TECHNIQUE: Multidetector CT imaging of the chest was performed using the standard protocol during bolus administration of intravenous contrast. Multiplanar CT image reconstructions and MIPs were obtained to evaluate the vascular anatomy. CONTRAST:  67mL OMNIPAQUE IOHEXOL 350 MG/ML SOLN COMPARISON:  Chest radiograph December 05, 2019 FINDINGS: Cardiovascular: There is no evident pulmonary embolus. There is no thoracic aortic aneurysm or dissection. There are scattered foci of calcification in proximal visualized great vessels. There are foci of aortic atherosclerosis. There are foci of coronary artery calcification at several sites. There is no pericardial effusion or pericardial thickening evident. Mediastinum/Nodes: There is a mass in the left lobe of the thyroid measuring 1.7 x 1.6 cm. Thyroid appears somewhat inhomogeneous elsewhere. There are scattered subcentimeter mediastinal lymph nodes. There is a subcarinal lymph node measuring 1.4 x 1.3 cm. No other adenopathy by size criteria evident in the thoracic region. Note that there are several upper normal in size axillary lymph nodes bilaterally. There is a small hiatal hernia. Lungs/Pleura: There is mild bibasilar lung atelectatic change. There is no evident edema or airspace opacity. No appreciable pleural effusions. Upper Abdomen: There is cholelithiasis. There is upper abdominal aortic atherosclerosis. Stomach mildly  distended with food material. Visualized upper abdominal structures otherwise appear unremarkable. Musculoskeletal: Superior endplate fracture at T9. Osteoarthritic change noted in the thoracic spine with diffuse idiopathic skeletal hyperostosis in the lower thoracic region. No blastic or lytic bone lesions. No chest wall lesions. Review of the MIP images confirms the above findings. IMPRESSION: 1. No demonstrable pulmonary embolus. No thoracic aortic aneurysm  or dissection. There is aortic atherosclerosis as well as foci of great vessel and coronary artery calcification. 2.  No edema or airspace opacity.  Bibasilar atelectasis. 3. 1.4 x 1.3 cm subcarinal lymph node. No other adenopathy by size criteria. Etiology for this lymph node enlargement uncertain. 4. Mass left lobe thyroid measuring 1.7 x 1.6 cm. Recommend thyroid US per consensus guidelines. (Ref: J Am Coll Radiol. 2015 Feb;12(2): 143-50).Thyroid elsewhere appears somewhat inhomogeneous. 5.  Small hiatal hernia. 6.  Cholelithiasis. 7.  Diffuse idiopathic skeletal hyperostosis in the thoracic spine. Aortic Atherosclerosis (ICD10-I70.0). Electronically Signed   By: Lowella Grip III M.D.   On: 12/05/2019 10:29   CT L-SPINE NO CHARGE  Result Date: 11/10/2019 CLINICAL DATA:  Low back pain. EXAM: CT LUMBAR SPINE WITHOUT CONTRAST TECHNIQUE: Multidetector CT imaging of the lumbar spine was performed without intravenous contrast administration. Multiplanar CT image reconstructions were also generated. COMPARISON:  Lumbar radiographs 11/10/2019 FINDINGS: Segmentation: Normal Alignment: Normal Vertebrae: Moderate compression fracture L1 vertebral body with approximately 50% loss of vertebral body height. Prominent anterior osteophytes consistent with chronic fracture. No acute fracture line. This fracture was described on there port from July 06, 2015. No bony retropulsion or spinal stenosis. No other fracture or mass lesion identified. Paraspinal and other soft tissues: Mild atherosclerotic disease in the aorta and iliacs without aneurysm. 3 mm right lower pole renal calculus without hydronephrosis. Small Bochdalek's hernia on the left. Disc levels: T12-L1: Mild disc degeneration and mild facet degeneration. Negative for stenosis L1-2: Mild disc degeneration and spurring on the left. No significant stenosis L2-3: Mild disc bulging and mild facet degeneration. Negative for stenosis L3-4: Mild disc bulging and mild  facet degeneration. Negative for stenosis. L4-5: Diffuse disc bulging and bilateral facet degeneration. Negative for stenosis L5-S1: Mild disc bulging. On the right bilateral facet degeneration, asymmetric. This is causing moderate to severe subarticular and foraminal stenosis on the right due to spurring. IMPRESSION: Moderate compression fracture L1, chronic by report in 2017. Asymmetric facet degeneration on the right L5-S1 causing severe subarticular and foraminal stenosis on the right. Electronically Signed   By: Franchot Gallo M.D.   On: 11/10/2019 16:08   DG Chest Port 1 View  Result Date: 12/05/2019 CLINICAL DATA:  Dyspnea, congestive heart failure EXAM: PORTABLE CHEST 1 VIEW COMPARISON:  06/29/2019 FINDINGS: Lungs are clear. No pneumothorax or pleural effusion. Left internal jugular hemodialysis catheter tip is seen at the superior right atrium. Cardiac size within normal limits. Pulmonary vascularity is normal. No acute bone abnormality. IMPRESSION: No active disease. Electronically Signed   By: Fidela Salisbury MD   On: 12/05/2019 04:29   CT Renal Stone Study  Result Date: 11/10/2019 CLINICAL DATA:  Lower back pain, flank pain EXAM: CT ABDOMEN AND PELVIS WITHOUT CONTRAST TECHNIQUE: Multidetector CT imaging of the abdomen and pelvis was performed following the standard protocol without IV contrast. COMPARISON:  11/10/2019 FINDINGS: Lower chest: No acute pleural or parenchymal lung disease. Hepatobiliary: Calcified gallstone is identified within the gallbladder fundus. No evidence of acute cholecystitis. The liver is unremarkable. Pancreas: Unremarkable. No pancreatic ductal dilatation or surrounding inflammatory changes. Spleen: Normal  in size without focal abnormality. Adrenals/Urinary Tract: Calcifications at the bilateral renal hila are felt to be vascular. No definite urinary tract calculi or obstructive uropathy within either kidney. The adrenals and bladder are grossly normal. Stomach/Bowel: No  bowel obstruction or ileus. Normal appendix right lower quadrant. No bowel wall thickening or inflammatory change. Vascular/Lymphatic: Aortic atherosclerosis. No enlarged abdominal or pelvic lymph nodes. Reproductive: Prostate is enlarged measuring 4.8 x 5.5 cm. Other: No free fluid or free gas. Bilateral fat containing inguinal hernias, left greater than right. Musculoskeletal: No acute or destructive bony lesions. Chronic L1 compression deformity. Reconstructed images demonstrate no additional findings. IMPRESSION: 1. Small calcifications of the bilateral renal hila are likely vascular given the degree of atherosclerosis within the aorta and its branches. No evidence of urinary tract calculi or obstruction. 2. Cholelithiasis without cholecystitis. 3. Bilateral fat containing inguinal hernias.  No bowel herniation. 4. Enlarged prostate. 5.  Aortic Atherosclerosis (ICD10-I70.0). Electronically Signed   By: Randa Ngo M.D.   On: 11/10/2019 16:22   Assessment/Plan 1.  Complication dialysis device with thrombosis AV access:   Patient admitted with respiratory failure.  Asked to move the patient's PermCath as it may be a contributing source of the patient's possible sepsis. The risks and benefits were described to the patient.  All questions were answered.  The patient agrees to proceed with angiography and intervention.  2.  End-stage renal disease requiring hemodialysis:  We will remove PermCath over the weekend and plan on replacement Monday.    3.  Hypertension:   Patient will continue medical management; nephrology is following no changes in oral medications.  4. Diabetes mellitus:   Glucose will be monitored and oral medications been held this morning once the patient has undergone the patient's procedure po intake will be reinitiated and again Accu-Cheks will be used to assess the blood glucose level and treat as needed. The patient will be restarted on the patient's usual hypoglycemic  regime  Discussed with Dr. Mayme Genta, PA-C  12/06/2019 12:30 PM

## 2019-12-06 NOTE — ED Notes (Signed)
Pt given phone for MRI screen.

## 2019-12-06 NOTE — Progress Notes (Signed)
Prn Hydralazine given    12/06/19 1854  Vitals  Temp 98.2 F (36.8 C)  Temp Source Oral  BP (!) 176/88  MAP (mmHg) 110  BP Location Left Arm  BP Method Automatic  Patient Position (if appropriate) Sitting  Pulse Rate (!) 105  Pulse Rate Source Monitor  Resp 20  MEWS COLOR  MEWS Score Color Green  Oxygen Therapy  SpO2 100 %  MEWS Score  MEWS Temp 0  MEWS Systolic 0  MEWS Pulse 1  MEWS RR 0  MEWS LOC 0  MEWS Score 1

## 2019-12-06 NOTE — ED Notes (Signed)
Vascular at bedside to remove permacath

## 2019-12-06 NOTE — ED Notes (Signed)
Pt awake, visitor at bedside. Alert and oriented to place, situation, and self however states year is 2001. Pt reminded about a UA, states he cannot use restroom at this time. Refusing placement of urine bag, [place] cath, etc to collect urine.

## 2019-12-06 NOTE — Consult Note (Signed)
[facility] Admission History & Physical  MRN : [id number]  Daniel Thomas is a 64 y.o. (Mar 15, 1955) male who presents with chief complaint of  Chief Complaint  Patient presents with   Shortness of Breath   Respiratory Distress   History of Present Illness:  I am asked to evaluate the patient by nephrology (Dr. Candiss Norse). Was sent to the emergency department by dialysis staff due to "respiratory distress".  Patient had PermCath placed approximately 2 months ago. Patient denies pain or tenderness overlying the access.  There is no pain with dialysis. No fevers or chills while on dialysis.  Admitted for acute respiratory failure with hypoxia secondary to COPD exacerbation and possibly mild acute on chronic diastolic congestive heart failure in setting of fluid overload with end-stage renal disease on hemodialysis.    Asked to remove PermCath as it may be contributing to the patient's respiratory failure with possible sepsis.  Current Facility-Administered Medications  Medication Dose Route Frequency Provider Last Rate Last Admin   0.9 %  sodium chloride infusion  250 mL Intravenous PRN Mansy, Jan A, MD       acetaminophen (TYLENOL) tablet 650 mg  650 mg Oral Q6H PRN Mansy, Jan A, MD   650 mg at 12/05/19 1450   Or   acetaminophen (TYLENOL) suppository 650 mg  650 mg Rectal Q6H PRN Mansy, Jan A, MD       carvedilol (COREG) tablet 12.5 mg  12.5 mg Oral BID WC Mansy, Jan A, MD   12.5 mg at 12/06/19 0842   cefTRIAXone (ROCEPHIN) 1 g in sodium chloride 0.9 % 100 mL IVPB  1 g Intravenous Q24H Mansy, Jan A, MD   Stopped at 12/05/19 1628   Chlorhexidine Gluconate Cloth 2 % PADS 6 each  6 each Topical Q0600 Murlean Iba, MD       citalopram (CELEXA) tablet 20 mg  20 mg Oral Daily Mansy, Jan A, MD   20 mg at 12/06/19 1049   epoetin alfa (EPOGEN) injection 4,000 Units  4,000 Units Intravenous Q T,Th,Sa-HD Murlean Iba, MD   4,000 Units at 12/05/19 1252   ferric  citrate (AURYXIA) tablet 210 mg  210 mg Oral TID AC Mansy, Jan A, MD   210 mg at 12/06/19 1122   heparin injection 5,000 Units  5,000 Units Subcutaneous Q8H Mansy, Jan A, MD   5,000 Units at 12/06/19 0842   hydrALAZINE (APRESOLINE) injection 10 mg  10 mg Intravenous Q6H PRN Mansy, Jan A, MD   10 mg at 12/05/19 0644   lidocaine (LIDODERM) 5 % 1 patch  1 patch Transdermal Q12H Mansy, Jan A, MD   1 patch at 12/06/19 1049   linagliptin (TRADJENTA) tablet 5 mg  5 mg Oral Daily Mansy, Jan A, MD   5 mg at 12/06/19 1049   methylPREDNISolone sodium succinate (SOLU-MEDROL) 40 mg/mL injection 40 mg  40 mg Intravenous Q12H Wieting, Richard, MD   40 mg at 12/06/19 0357   Followed by   Derrill Memo ON 12/07/2019] predniSONE (DELTASONE) tablet 40 mg  40 mg Oral Q breakfast Wieting, Richard, MD       metoprolol tartrate (LOPRESSOR) injection 5 mg  5 mg Intravenous Q1H PRN Loletha Grayer, MD       ondansetron [facility]) tablet 4 mg  4 mg Oral Q6H PRN Mansy, Jan A, MD       Or   ondansetron [facility]) injection 4 mg  4 mg Intravenous Q6H PRN Mansy, Arvella Merles, MD  sodium chloride flush (NS) 0.9 % injection 3 mL  3 mL Intravenous Q12H Mansy, Jan A, MD   3 mL at 12/06/19 1051   sodium chloride flush (NS) 0.9 % injection 3 mL  3 mL Intravenous PRN Mansy, Jan A, MD       tiZANidine (ZANAFLEX) tablet 2 mg  2 mg Oral TID PRN Mansy, Jan A, MD       traZODone (DESYREL) tablet 25 mg  25 mg Oral QHS PRN Mansy, Arvella Merles, MD       [START ON 12/10/2019] Vitamin D (Ergocalciferol) (DRISDOL) capsule 50,000 Units  50,000 Units Oral UD Mansy, Arvella Merles, MD       Current Outpatient Medications  Medication Sig Dispense Refill   albuterol (VENTOLIN HFA) 108 (90 Base) MCG/ACT inhaler Inhale 2 puffs into the lungs every 6 (six) hours as needed for wheezing or shortness of breath.     atorvastatin (LIPITOR) 40 MG tablet Take 40 mg by mouth at bedtime.     calcium carbonate (TUMS - DOSED IN MG ELEMENTAL CALCIUM) 500 MG chewable tablet  Chew 1 tablet by mouth every 6 (six) hours as needed for indigestion.     carvedilol (COREG) 12.5 MG tablet Take 12.5 mg by mouth 2 (two) times daily with a meal.     citalopram (CELEXA) 20 MG tablet Take 20 mg by mouth daily.     ergocalciferol (DRISDOL) 1.25 MG (50000 UT) capsule Take by mouth. Administered at analysis     ferric citrate (AURYXIA) 1 GM 210 MG(Fe) tablet Take by mouth.      hydrALAZINE (APRESOLINE) 25 MG tablet Take 1 tablet (25 mg total) by mouth every 8 (eight) hours. 90 tablet 0   lidocaine (LIDODERM) 5 % Place 1 patch onto the skin every 12 (twelve) hours. Remove & Discard patch within 12 hours or as directed by MD 10 patch 0   linagliptin (TRADJENTA) 5 MG TABS tablet Take 1 tablet (5 mg total) by mouth daily. 30 tablet 0   meloxicam (MOBIC) 7.5 MG tablet Take 7.5 mg by mouth daily.     Methoxy PEG-Epoetin Beta (MIRCERA IJ) Mircera     tiZANidine (ZANAFLEX) 2 MG tablet Take 2 mg by mouth 3 (three) times daily as needed.     traZODone (DESYREL) 50 MG tablet Take 50 mg by mouth at bedtime as needed for sleep.     amLODipine (NORVASC) 10 MG tablet Take 1 tablet (10 mg total) by mouth at bedtime. 30 tablet 0   losartan (COZAAR) 50 MG tablet Take 1 tablet (50 mg total) by mouth at bedtime. 30 tablet 0   predniSONE (DELTASONE) 20 MG tablet 2 tabs po daily for 4 days 8 tablet 0   Past Medical History:  Diagnosis Date   COPD (chronic obstructive pulmonary disease) ([place])    Diabetes mellitus without complication ([place])    Enlarged prostate    ESRD on dialysis ([place])    Heart murmur    High cholesterol    Hypertension    Past Surgical History:  Procedure Laterality Date   DIALYSIS/PERMA CATHETER INSERTION N/A 01/12/2018   Procedure: DIALYSIS/PERMA CATHETER INSERTION;  Surgeon: Katha Cabal, MD;  Location: [facility];  Service: Cardiovascular;  Laterality: N/A;   DIALYSIS/PERMA CATHETER INSERTION N/A 05/16/2019   Procedure: DIALYSIS/PERMA  CATHETER INSERTION;  Surgeon: Algernon Huxley, MD;  Location: [facility];  Service: Cardiovascular;  Laterality: N/A;   DIALYSIS/PERMA CATHETER INSERTION N/A 10/17/2019   Procedure:  DIALYSIS/PERMA CATHETER INSERTION;  Surgeon: Algernon Huxley, MD;  Location: [facility];  Service: Cardiovascular;  Laterality: N/A;   DIALYSIS/PERMA CATHETER REMOVAL N/A 05/13/2019   Procedure: DIALYSIS/PERMA CATHETER REMOVAL;  Surgeon: Algernon Huxley, MD;  Location: [facility];  Service: Cardiovascular;  Laterality: N/A;   TEE WITHOUT CARDIOVERSION N/A 05/15/2019   Procedure: TRANSESOPHAGEAL ECHOCARDIOGRAM (TEE);  Surgeon: Teodoro Spray, MD;  Location: [facility];  Service: Cardiovascular;  Laterality: N/A;   Social History Social History   Tobacco Use   Smoking status: Former Smoker    Years: 20.00   Smokeless tobacco: Never Used  Scientific laboratory technician Use: Never used  Substance Use Topics   Alcohol use: Never   Drug use: Never   Family History History reviewed. No pertinent family history.   No family history of bleeding or clotting disorders, autoimmune disease or porphyria.  No Known Allergies  REVIEW OF SYSTEMS (Negative unless checked)  Constitutional: [] Weight loss  [] Fever  [] Chills Cardiac: [] Chest pain   [] Chest pressure   [] Palpitations   [x] Shortness of breath when laying flat   [x] Shortness of breath at rest   [x] Shortness of breath with exertion. Vascular:  [] Pain in legs with walking   [] Pain in legs at rest   [] Pain in legs when laying flat   [] Claudication   [] Pain in feet when walking  [] Pain in feet at rest  [] Pain in feet when laying flat   [] History of DVT   [] Phlebitis   [] Swelling in legs   [] Varicose veins   [] Non-healing ulcers Pulmonary:   [] Uses home oxygen   [] Productive cough   [] Hemoptysis   [] Wheeze  [] COPD   [] Asthma Neurologic:  [] Dizziness  [] Blackouts   [] Seizures   [] History of stroke   [] History of TIA  [] Aphasia   [] Temporary blindness    [] Dysphagia   [] Weakness or numbness in arms   [] Weakness or numbness in legs Musculoskeletal:  [] Arthritis   [] Joint swelling   [] Joint pain   [] Low back pain Hematologic:  [] Easy bruising  [] Easy bleeding   [] Hypercoagulable state   [] Anemic  [] Hepatitis Gastrointestinal:  [] Blood in stool   [] Vomiting blood  [] Gastroesophageal reflux/heartburn   [] Difficulty swallowing. Genitourinary:  [x] Chronic kidney disease   [] Difficult urination  [] Frequent urination  [] Burning with urination   [] Blood in urine Skin:  [] Rashes   [] Ulcers   [] Wounds Psychological:  [] History of anxiety   []  History of major depression.  Physical Examination  Vitals:   12/06/19 0830 12/06/19 0900 12/06/19 1000 12/06/19 1030  BP: (!) 155/89 (!) 170/91 (!) 154/83 (!) 160/87  Pulse: 95 (!) 102 100 98  Resp: 14 18 17 15   Temp:      TempSrc:      SpO2: 100%   99%  Weight:       Body mass index is 43.61 kg/m. Gen: WD/WN, NAD Head: [place]/AT, No temporalis wasting. Prominent temp pulse not noted. Ear/Nose/Throat: Hearing grossly intact, nares w/o erythema or drainage, oropharynx w/o Erythema/Exudate,  Eyes: Conjunctiva clear, sclera non-icteric Neck: Trachea midline.  No JVD.  Pulmonary:  Good air movement, respirations not labored, no use of accessory muscles.  Cardiac: RRR, normal S1, S2. Vascular:  Vessel Right Left  Radial Palpable Palpable  Ulnar Not Palpable Not Palpable  Brachial Palpable Palpable  Carotid Palpable, without bruit Palpable, without bruit   Permcath:  Gastrointestinal: soft, non-tender/non-distended. No guarding/reflex.  Musculoskeletal: M/S 5/5 throughout.  Extremities without ischemic changes.  No  deformity or atrophy.  Neurologic: Sensation grossly intact in extremities.  Symmetrical.  Speech is fluent. Motor exam as listed above. Psychiatric: Judgment intact, Mood & affect appropriate for pt's clinical situation. Dermatologic: No rashes or ulcers noted.  No cellulitis or open  wounds. Lymph : No Cervical, Axillary, or Inguinal lymphadenopathy.  CBC Lab Results  Component Value Date   WBC 32.8 (H) 12/06/2019   HGB 8.3 (L) 12/06/2019   HCT 25.4 (L) 12/06/2019   MCV 93.7 12/06/2019   PLT 306 12/06/2019   BMET    Component Value Date/Time   NA 135 12/06/2019 0357   K 4.2 12/06/2019 0357   CL 97 (L) 12/06/2019 0357   CO2 22 12/06/2019 0357   GLUCOSE 156 (H) 12/06/2019 0357   BUN 43 (H) 12/06/2019 0357   CREATININE 6.37 (H) 12/06/2019 0357   CALCIUM 8.6 (L) 12/06/2019 0357   GFRNONAA 9 (L) 12/06/2019 0357   GFRAA 10 (L) 06/29/2019 0340   Estimated Creatinine Clearance: 12.3 mL/min (A) (by C-G formula based on SCr of 6.37 mg/dL (H)).  COAG Lab Results  Component Value Date   INR 1.1 12/05/2019   INR 1.1 05/12/2019   Radiology DG Lumbar Spine 2-3 Views  Result Date: 11/10/2019 CLINICAL DATA:  Lower back pain across to sacrum area for over 1 month, no known injury EXAM: LUMBAR SPINE - 2-3 VIEW COMPARISON:  None available; correlation is made with the report from a prior study of 07/06/2015 FINDINGS: Osseous demineralization. Five non-rib bearing lumbar vertebrae. Superior endplate compression fracture of L1 vertebral body with 50% anterior height loss, age indeterminate in appearance; an L1 compression fracture bone of unspecified degree is reported on the prior exam from 2017. Remaining lumbar vertebral body heights maintained. No additional fracture, subluxation, or bone destruction. SI joints preserved. Atherosclerotic calcifications aorta and iliac arteries. IMPRESSION: Age-indeterminate superior endplate compression fracture of L1 vertebral body. An L1 compression fracture of unspecified degree is reported on a prior exam from 2017, but the images are not available for direct comparison to know if this is stable or progressive. Electronically Signed   By: Lavonia Dana M.D.   On: 11/10/2019 13:38   CT ANGIO CHEST PE W OR WO CONTRAST  Result Date:  12/05/2019 CLINICAL DATA:  Shortness of breath EXAM: CT ANGIOGRAPHY CHEST WITH CONTRAST TECHNIQUE: Multidetector CT imaging of the chest was performed using the standard protocol during bolus administration of intravenous contrast. Multiplanar CT image reconstructions and MIPs were obtained to evaluate the vascular anatomy. CONTRAST:  59mL OMNIPAQUE IOHEXOL 350 MG/ML SOLN COMPARISON:  Chest radiograph December 05, 2019 FINDINGS: Cardiovascular: There is no evident pulmonary embolus. There is no thoracic aortic aneurysm or dissection. There are scattered foci of calcification in proximal visualized great vessels. There are foci of aortic atherosclerosis. There are foci of coronary artery calcification at several sites. There is no pericardial effusion or pericardial thickening evident. Mediastinum/Nodes: There is a mass in the left lobe of the thyroid measuring 1.7 x 1.6 cm. Thyroid appears somewhat inhomogeneous elsewhere. There are scattered subcentimeter mediastinal lymph nodes. There is a subcarinal lymph node measuring 1.4 x 1.3 cm. No other adenopathy by size criteria evident in the thoracic region. Note that there are several upper normal in size axillary lymph nodes bilaterally. There is a small hiatal hernia. Lungs/Pleura: There is mild bibasilar lung atelectatic change. There is no evident edema or airspace opacity. No appreciable pleural effusions. Upper Abdomen: There is cholelithiasis. There is upper abdominal aortic atherosclerosis. Stomach mildly  distended with food material. Visualized upper abdominal structures otherwise appear unremarkable. Musculoskeletal: Superior endplate fracture at T9. Osteoarthritic change noted in the thoracic spine with diffuse idiopathic skeletal hyperostosis in the lower thoracic region. No blastic or lytic bone lesions. No chest wall lesions. Review of the MIP images confirms the above findings. IMPRESSION: 1. No demonstrable pulmonary embolus. No thoracic aortic aneurysm  or dissection. There is aortic atherosclerosis as well as foci of great vessel and coronary artery calcification. 2.  No edema or airspace opacity.  Bibasilar atelectasis. 3. 1.4 x 1.3 cm subcarinal lymph node. No other adenopathy by size criteria. Etiology for this lymph node enlargement uncertain. 4. Mass left lobe thyroid measuring 1.7 x 1.6 cm. Recommend thyroid US per consensus guidelines. (Ref: J Am Coll Radiol. 2015 Feb;12(2): 143-50).Thyroid elsewhere appears somewhat inhomogeneous. 5.  Small hiatal hernia. 6.  Cholelithiasis. 7.  Diffuse idiopathic skeletal hyperostosis in the thoracic spine. Aortic Atherosclerosis (ICD10-I70.0). Electronically Signed   By: Lowella Grip III M.D.   On: 12/05/2019 10:29   CT L-SPINE NO CHARGE  Result Date: 11/10/2019 CLINICAL DATA:  Low back pain. EXAM: CT LUMBAR SPINE WITHOUT CONTRAST TECHNIQUE: Multidetector CT imaging of the lumbar spine was performed without intravenous contrast administration. Multiplanar CT image reconstructions were also generated. COMPARISON:  Lumbar radiographs 11/10/2019 FINDINGS: Segmentation: Normal Alignment: Normal Vertebrae: Moderate compression fracture L1 vertebral body with approximately 50% loss of vertebral body height. Prominent anterior osteophytes consistent with chronic fracture. No acute fracture line. This fracture was described on there port from July 06, 2015. No bony retropulsion or spinal stenosis. No other fracture or mass lesion identified. Paraspinal and other soft tissues: Mild atherosclerotic disease in the aorta and iliacs without aneurysm. 3 mm right lower pole renal calculus without hydronephrosis. Small Bochdalek's hernia on the left. Disc levels: T12-L1: Mild disc degeneration and mild facet degeneration. Negative for stenosis L1-2: Mild disc degeneration and spurring on the left. No significant stenosis L2-3: Mild disc bulging and mild facet degeneration. Negative for stenosis L3-4: Mild disc bulging and mild  facet degeneration. Negative for stenosis. L4-5: Diffuse disc bulging and bilateral facet degeneration. Negative for stenosis L5-S1: Mild disc bulging. On the right bilateral facet degeneration, asymmetric. This is causing moderate to severe subarticular and foraminal stenosis on the right due to spurring. IMPRESSION: Moderate compression fracture L1, chronic by report in 2017. Asymmetric facet degeneration on the right L5-S1 causing severe subarticular and foraminal stenosis on the right. Electronically Signed   By: Franchot Gallo M.D.   On: 11/10/2019 16:08   DG Chest Port 1 View  Result Date: 12/05/2019 CLINICAL DATA:  Dyspnea, congestive heart failure EXAM: PORTABLE CHEST 1 VIEW COMPARISON:  06/29/2019 FINDINGS: Lungs are clear. No pneumothorax or pleural effusion. Left internal jugular hemodialysis catheter tip is seen at the superior right atrium. Cardiac size within normal limits. Pulmonary vascularity is normal. No acute bone abnormality. IMPRESSION: No active disease. Electronically Signed   By: Fidela Salisbury MD   On: 12/05/2019 04:29   CT Renal Stone Study  Result Date: 11/10/2019 CLINICAL DATA:  Lower back pain, flank pain EXAM: CT ABDOMEN AND PELVIS WITHOUT CONTRAST TECHNIQUE: Multidetector CT imaging of the abdomen and pelvis was performed following the standard protocol without IV contrast. COMPARISON:  11/10/2019 FINDINGS: Lower chest: No acute pleural or parenchymal lung disease. Hepatobiliary: Calcified gallstone is identified within the gallbladder fundus. No evidence of acute cholecystitis. The liver is unremarkable. Pancreas: Unremarkable. No pancreatic ductal dilatation or surrounding inflammatory changes. Spleen: Normal  in size without focal abnormality. Adrenals/Urinary Tract: Calcifications at the bilateral renal hila are felt to be vascular. No definite urinary tract calculi or obstructive uropathy within either kidney. The adrenals and bladder are grossly normal. Stomach/Bowel: No  bowel obstruction or ileus. Normal appendix right lower quadrant. No bowel wall thickening or inflammatory change. Vascular/Lymphatic: Aortic atherosclerosis. No enlarged abdominal or pelvic lymph nodes. Reproductive: Prostate is enlarged measuring 4.8 x 5.5 cm. Other: No free fluid or free gas. Bilateral fat containing inguinal hernias, left greater than right. Musculoskeletal: No acute or destructive bony lesions. Chronic L1 compression deformity. Reconstructed images demonstrate no additional findings. IMPRESSION: 1. Small calcifications of the bilateral renal hila are likely vascular given the degree of atherosclerosis within the aorta and its branches. No evidence of urinary tract calculi or obstruction. 2. Cholelithiasis without cholecystitis. 3. Bilateral fat containing inguinal hernias.  No bowel herniation. 4. Enlarged prostate. 5.  Aortic Atherosclerosis (ICD10-I70.0). Electronically Signed   By: Randa Ngo M.D.   On: 11/10/2019 16:22   Assessment/Plan 1.  Complication dialysis device with thrombosis AV access:   Patient admitted with respiratory failure.  Asked to move the patient's PermCath as it may be a contributing source of the patient's possible sepsis. The risks and benefits were described to the patient.  All questions were answered.  The patient agrees to proceed with angiography and intervention.  2.  End-stage renal disease requiring hemodialysis:  We will remove PermCath over the weekend and plan on replacement Monday.    3.  Hypertension:   Patient will continue medical management; nephrology is following no changes in oral medications.  4. Diabetes mellitus:   Glucose will be monitored and oral medications been held this morning once the patient has undergone the patient's procedure po intake will be reinitiated and again Accu-Cheks will be used to assess the blood glucose level and treat as needed. The patient will be restarted on the patient's usual hypoglycemic  regime  Discussed with Dr. Mayme Genta, PA-C  12/06/2019 12:30 PM

## 2019-12-06 NOTE — Progress Notes (Signed)
[name], male   DOB: November 10, 1955, 65 y.o.   MRN: [id number] [facility] PROGRESS NOTE  Daniel Thomas YYT:[id number] DOB: Dec 03, 1955 DOA: 12/05/2019 PCP: Marguerita Merles, MD  HPI/Subjective: Patient feeling better today.  Was sitting up when I saw him.  Patient had a fever of 103 after dialysis.  Dialysis catheter removed this afternoon.  No complaints of shortness of breath.  No coughing.  Objective: Vitals:   12/06/19 1000 12/06/19 1030  BP: (!) 154/83 (!) 160/87  Pulse: 100 98  Resp: 17 15  Temp:    SpO2:  99%    Intake/Output Summary (Last 24 hours) at 12/06/2019 1229 Last data filed at 12/05/2019 1330 Gross per 24 hour  Intake --  Output 2000 ml  Net -2000 ml   Filed Weights   12/05/19 0405  Weight: 104.7 kg    ROS: Review of Systems  Respiratory: Negative for cough and shortness of breath.   Cardiovascular: Negative for chest pain.  Gastrointestinal: Negative for abdominal pain, nausea and vomiting.   Exam: Physical Exam HENT:     Head: Normocephalic.     Mouth/Throat:     Pharynx: No oropharyngeal exudate.  Eyes:     General: Lids are normal.     Conjunctiva/sclera: Conjunctivae normal.     Pupils: Pupils are equal, round, and reactive to light.  Cardiovascular:     Rate and Rhythm: Normal rate and regular rhythm.     Heart sounds: S1 normal and S2 normal. Murmur heard.  Systolic murmur is present with a grade of 2/6.   Pulmonary:     Breath sounds: No decreased breath sounds, wheezing, rhonchi or rales.  Abdominal:     Palpations: Abdomen is soft.     Tenderness: There is no abdominal tenderness.  Musculoskeletal:     Right lower leg: No swelling.     Left lower leg: No swelling.  Skin:    General: Skin is warm.     Findings: No rash.  Neurological:     Mental Status: He is alert and oriented to person, place, and time.       Data Reviewed: Basic Metabolic Panel: Recent Labs  Lab 11/30/19 1025 12/05/19 0402  12/06/19 0357  NA 134* 135 135  K 3.9 5.2* 4.2  CL 96* 101 97*  CO2 24 21* 22  GLUCOSE 142* 117* 156*  BUN 47* 59* 43*  CREATININE 7.37* 8.31* 6.37*  CALCIUM 8.8* 8.5* 8.6*  MG  --  2.2  --   PHOS  --  6.6*  --    Liver Function Tests: Recent Labs  Lab 12/05/19 0402  AST 26  ALT 26  ALKPHOS 105  BILITOT 1.1  PROT 7.9  ALBUMIN 3.9   CBC: Recent Labs  Lab 11/30/19 1025 12/05/19 0402 12/06/19 0357  WBC 8.6 16.4* 32.8*  NEUTROABS  --  13.3*  --   HGB 9.7* 9.8* 8.3*  HCT 29.0* 29.5* 25.4*  MCV 90.3 93.4 93.7  PLT 362 368 306   BNP (last 3 results) Recent Labs    12/05/19 0402  BNP 902.6*     Recent Results (from the past 240 hour(s))  Resp Panel by RT-PCR (Flu A&B, Covid) Nasopharyngeal Swab     Status: None   Collection Time: 12/05/19  4:02 AM   Specimen: Nasopharyngeal Swab; Nasopharyngeal(NP) swabs in vial transport medium  Result Value Ref Range Status   SARS Coronavirus 2 by RT PCR NEGATIVE NEGATIVE Final  Comment: (NOTE) SARS-CoV-2 target nucleic acids are NOT DETECTED.  The SARS-CoV-2 RNA is generally detectable in upper respiratory specimens during the acute phase of infection. The lowest concentration of SARS-CoV-2 viral copies this assay can detect is 138 copies/mL. A negative result does not preclude SARS-Cov-2 infection and should not be used as the sole basis for treatment or other patient management decisions. A negative result may occur with  improper specimen collection/handling, submission of specimen other than nasopharyngeal swab, presence of viral mutation(s) within the areas targeted by this assay, and inadequate number of viral copies(<138 copies/mL). A negative result must be combined with clinical observations, patient history, and epidemiological information. The expected result is Negative.  Fact Sheet for Patients:  EntrepreneurPulse.com.au  Fact Sheet for Healthcare Providers:   IncredibleEmployment.be  This test is no t yet approved or cleared by the [place] FDA and  has been authorized for detection and/or diagnosis of SARS-CoV-2 by FDA under an Emergency Use Authorization (EUA). This EUA will remain  in effect (meaning this test can be used) for the duration of the COVID-19 declaration under Section 564(b)(1) of the Act, 21 U.S.C.section 360bbb-3(b)(1), unless the authorization is terminated  or revoked sooner.       Influenza A by PCR NEGATIVE NEGATIVE Final   Influenza B by PCR NEGATIVE NEGATIVE Final    Comment: (NOTE) The Xpert Xpress SARS-CoV-2/FLU/RSV plus assay is intended as an aid in the diagnosis of influenza from Nasopharyngeal swab specimens and should not be used as a sole basis for treatment. Nasal washings and aspirates are unacceptable for Xpert Xpress SARS-CoV-2/FLU/RSV testing.  Fact Sheet for Patients: EntrepreneurPulse.com.au  Fact Sheet for Healthcare Providers: IncredibleEmployment.be  This test is not yet approved or cleared by the [place] FDA and has been authorized for detection and/or diagnosis of SARS-CoV-2 by FDA under an Emergency Use Authorization (EUA). This EUA will remain in effect (meaning this test can be used) for the duration of the COVID-19 declaration under Section 564(b)(1) of the Act, 21 U.S.C. section 360bbb-3(b)(1), unless the authorization is terminated or revoked.  Performed at [facility], [address] [place], [place] [address]   Culture, blood (Routine X 2) w Reflex to ID Panel     Status: None (Preliminary result)   Collection Time: 12/05/19  6:26 AM   Specimen: BLOOD  Result Value Ref Range Status   Specimen Description BLOOD LEFT ANTECUBITAL  Final   Special Requests   Final    BOTTLES DRAWN AEROBIC AND ANAEROBIC Blood Culture results may not be optimal due to an excessive volume of blood received in culture  bottles   Culture   Final    NO GROWTH 1 DAY Performed at [facility], [address] [place], [place] [address]    Report Status PENDING  Incomplete  Culture, blood (Routine X 2) w Reflex to ID Panel     Status: None (Preliminary result)   Collection Time: 12/05/19  6:26 AM   Specimen: BLOOD  Result Value Ref Range Status   Specimen Description BLOOD RIGHT ANTECUBITAL  Final   Special Requests   Final    BOTTLES DRAWN AEROBIC AND ANAEROBIC Blood Culture results may not be optimal due to an excessive volume of blood received in culture bottles   Culture   Final    NO GROWTH 1 DAY Performed at [facility], [address] [place], [place] [address]    Report Status PENDING  Incomplete     Studies:  CT ANGIO CHEST PE W OR WO CONTRAST  Result Date: 12/05/2019 CLINICAL DATA:  Shortness of breath EXAM: CT ANGIOGRAPHY CHEST WITH CONTRAST TECHNIQUE: Multidetector CT imaging of the chest was performed using the standard protocol during bolus administration of intravenous contrast. Multiplanar CT image reconstructions and MIPs were obtained to evaluate the vascular anatomy. CONTRAST:  66mL OMNIPAQUE IOHEXOL 350 MG/ML SOLN COMPARISON:  Chest radiograph December 05, 2019 FINDINGS: Cardiovascular: There is no evident pulmonary embolus. There is no thoracic aortic aneurysm or dissection. There are scattered foci of calcification in proximal visualized great vessels. There are foci of aortic atherosclerosis. There are foci of coronary artery calcification at several sites. There is no pericardial effusion or pericardial thickening evident. Mediastinum/Nodes: There is a mass in the left lobe of the thyroid measuring 1.7 x 1.6 cm. Thyroid appears somewhat inhomogeneous elsewhere. There are scattered subcentimeter mediastinal lymph nodes. There is a subcarinal lymph node measuring 1.4 x 1.3 cm. No other adenopathy by size criteria evident in the thoracic region. Note that there are several  upper normal in size axillary lymph nodes bilaterally. There is a small hiatal hernia. Lungs/Pleura: There is mild bibasilar lung atelectatic change. There is no evident edema or airspace opacity. No appreciable pleural effusions. Upper Abdomen: There is cholelithiasis. There is upper abdominal aortic atherosclerosis. Stomach mildly distended with food material. Visualized upper abdominal structures otherwise appear unremarkable. Musculoskeletal: Superior endplate fracture at T9. Osteoarthritic change noted in the thoracic spine with diffuse idiopathic skeletal hyperostosis in the lower thoracic region. No blastic or lytic bone lesions. No chest wall lesions. Review of the MIP images confirms the above findings. IMPRESSION: 1. No demonstrable pulmonary embolus. No thoracic aortic aneurysm or dissection. There is aortic atherosclerosis as well as foci of great vessel and coronary artery calcification. 2.  No edema or airspace opacity.  Bibasilar atelectasis. 3. 1.4 x 1.3 cm subcarinal lymph node. No other adenopathy by size criteria. Etiology for this lymph node enlargement uncertain. 4. Mass left lobe thyroid measuring 1.7 x 1.6 cm. Recommend thyroid US per consensus guidelines. (Ref: J Am Coll Radiol. 2015 Feb;12(2): 143-50).Thyroid elsewhere appears somewhat inhomogeneous. 5.  Small hiatal hernia. 6.  Cholelithiasis. 7.  Diffuse idiopathic skeletal hyperostosis in the thoracic spine. Aortic Atherosclerosis (ICD10-I70.0). Electronically Signed   By: Lowella Grip III M.D.   On: 12/05/2019 10:29   DG Chest Port 1 View  Result Date: 12/05/2019 CLINICAL DATA:  Dyspnea, congestive heart failure EXAM: PORTABLE CHEST 1 VIEW COMPARISON:  06/29/2019 FINDINGS: Lungs are clear. No pneumothorax or pleural effusion. Left internal jugular hemodialysis catheter tip is seen at the superior right atrium. Cardiac size within normal limits. Pulmonary vascularity is normal. No acute bone abnormality. IMPRESSION: No active  disease. Electronically Signed   By: Fidela Salisbury MD   On: 12/05/2019 04:29    Scheduled Meds: . carvedilol  12.5 mg Oral BID WC  . Chlorhexidine Gluconate Cloth  6 each Topical Q0600  . citalopram  20 mg Oral Daily  . epoetin (EPOGEN/PROCRIT) injection  4,000 Units Intravenous Q T,Th,Sa-HD  . ferric citrate  210 mg Oral TID AC  . heparin  5,000 Units Subcutaneous Q8H  . lidocaine  1 patch Transdermal Q12H  . linagliptin  5 mg Oral Daily  . methylPREDNISolone (SOLU-MEDROL) injection  40 mg Intravenous Q12H   Followed by  . [START ON 12/07/2019] predniSONE  40 mg Oral Q breakfast  . sodium chloride flush  3 mL Intravenous Q12H  . [START ON 12/10/2019] Vitamin  D (Ergocalciferol)  50,000 Units Oral UD   Continuous Infusions: . sodium chloride    . cefTRIAXone (ROCEPHIN)  IV Stopped (12/05/19 1628)    Assessment/Plan:  1. Severe sepsis, present on admission with acute hypoxic respiratory failure.  Fever of 103 after dialysis yesterday with tachycardia and leukocytosis.  Blood cultures so far negative.  Dialysis catheter removed and tip sent for culture.  Vascular surgeon believes that this is the source of infection.  White count up further today.  Continue vancomycin and Rocephin for now. 2. Acute hypoxic respiratory failure.  This has resolved patient on room air.  Initially required BiPAP and had an ABG showing a pH of 7.3 (less than 7.35).  Likely secondary to sepsis and fluid overload. 3. COPD exacerbation.  Lungs are clear now.  Discontinue Solu-Medrol.  Continue nebulizer treatments 4. Acute on chronic diastolic congestive heart failure.  Dialysis to manage fluid.  Patient started on IV Lasix by admitting physician. 5. End-stage renal disease.  Had dialysis yesterday.  Dialysis catheter removed today secondary to fever yesterday.  If dialysis needed over the weekend then will need temporary catheter. 6. 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  TSH and free T4 within the normal range.  Likely  will need outpatient biopsy. 7. Essential hypertension.  Continue Coreg hydralazine Norvasc and losartan 8. Depression on Celexa 9. Urology ordered an MRI of the prostate which the patient has not gotten it will order while here.        Code Status:     Code Status Orders  (From admission, onward)         Start     Ordered   12/05/19 0602  Full code  Continuous        12/05/19 0604        Code Status History    Date Active Date Inactive Code Status Order ID Comments User Context   05/12/2019 2331 05/18/2019 0014 Full Code [id number]  Elwyn Reach, MD Inpatient   04/17/2018 1436 04/18/2018 1611 Full Code [id number]  Saundra Shelling, MD Inpatient   01/12/2018 1658 01/16/2018 2207 Full Code [id number]  Bettey Costa, MD Inpatient   01/06/2018 2228 01/08/2018 1455 Full Code [id number]  Salary, Avel Peace, MD ED   Advance Care Planning Activity     Family Communication: Tried to call the patient's brother on the phone but unable to leave a message because mailbox is full Disposition Plan: Status is: Inpatient  Dispo: The patient is from: Home              Anticipated d/c is to: Home              Anticipated d/c date is: Possibly December 7              Patient currently being treated for severe sepsis with high fever and dialysis catheter infection.  Dialysis catheter removed.  Will need a PermCath prior to disposition.  Consultants:  Nephrology  Vascular surgery  Procedures:  PermCath removal  Antibiotics:  Vancomycin  Rocephin  Time spent: 27 minutes  [place]

## 2019-12-06 NOTE — ED Notes (Addendum)
This RN introduced self to pt. Pt sitting comfortably in the bedside chair. Pt denies any needs at this time. Pt updated on POC.

## 2019-12-06 NOTE — Consult Note (Signed)
Pharmacy Antibiotic Note  Daniel Thomas is a 64 y.o. male admitted on 12/05/2019 with unclear source of infection but with long-term indwelling catheter in place ISO SIRS.  Pharmacy has been consulted for Vancomycin dosing after pt experienced fever of 101F post-HD 12/2 (1030-1330). 2L out recorded on this session. Patient received Vancomycin 2.25g (~22mg /kg) loading dose x1 (12/2 1515) and has no additional recorded outs.  Assessment: Vancomycin has been adequately loaded. However, Pt is currently refusing urine bag, has no recorded outs, and current HD cath has suspected infection with planned removal today.  Suspect low removal of vanc in absence of HD.  Plan: 1. Plan to check Vanc random level Saturday evening if no temp HD line or schedule is in place yet to assess if dose is needed.  2. Or, If line is in place and HD planned, administer 1g post-HD and draw level 6hrs post HD to assess vanc after redistribution.  Weight: 104.7 kg (230 lb 13.2 oz)  Temp (24hrs), Avg:100.1 F (37.8 C), Min:97.9 F (36.6 C), Max:103 F (39.4 C)  Recent Labs  Lab 11/30/19 1025 12/05/19 0402 12/06/19 0357  WBC 8.6 16.4* 32.8*  CREATININE 7.37* 8.31* 6.37*    Estimated Creatinine Clearance: 12.3 mL/min (A) (by C-G formula based on SCr of 6.37 mg/dL (H)).    No Known Allergies  Antimicrobials this admission: Ceftriaxone  1g 12/2 >>  Vancomycin 2.25g; 1g qHD 12/2>>    Microbiology results: 12/2 BCx: NGTD 12/2 Sputum: NGTD  12/2 MRSA PCR: pending  Thank you for allowing pharmacy to be a part of this patient's care.  Shanon Brow Cordarius Benning 12/06/2019 10:28 AM

## 2019-12-07 DIAGNOSIS — I5033 Acute on chronic diastolic (congestive) heart failure: Secondary | ICD-10-CM | POA: Diagnosis not present

## 2019-12-07 DIAGNOSIS — A419 Sepsis, unspecified organism: Secondary | ICD-10-CM | POA: Diagnosis not present

## 2019-12-07 DIAGNOSIS — J9601 Acute respiratory failure with hypoxia: Secondary | ICD-10-CM | POA: Diagnosis not present

## 2019-12-07 DIAGNOSIS — N186 End stage renal disease: Secondary | ICD-10-CM | POA: Diagnosis not present

## 2019-12-07 MED ORDER — VANCOMYCIN VARIABLE DOSE PER UNSTABLE RENAL FUNCTION (PHARMACIST DOSING): Status: DC

## 2019-12-07 NOTE — Consult Note (Signed)
Pharmacy Antibiotic Note  Daniel Thomas is a 64 y.o. male admitted on 12/05/2019 with unclear source of infection but with long-term indwelling catheter in place ISO SIRS.  Pharmacy has been consulted for Vancomycin dosing after pt experienced fever of 101F post-HD 12/2 (1030-1330). 2L out recorded on this session. Patient received Vancomycin 2.25g (~22mg /kg) loading dose x1 (12/2 1515) and has no additional recorded outs.  Assessment: Vancomycin load 12/2. PermCath removed following concern for possible infection given fever after dialysis. Last HD session 12/2. Allowing catheter free period during the weekend.  Plan: Will check vancomycin level today (~ 48 hrs post loading dose) given current plan for no dialysis this weekend.  Weight: 76.6 kg (168 lb 14 oz)  Temp (24hrs), Avg:98.4 F (36.9 C), Min:98.2 F (36.8 C), Max:98.7 F (37.1 C)  Recent Labs  Lab 12/05/19 0402 12/06/19 0357  WBC 16.4* 32.8*  CREATININE 8.31* 6.37*    Estimated Creatinine Clearance: 10.4 mL/min (A) (by C-G formula based on SCr of 6.37 mg/dL (H)).    No Known Allergies  Antimicrobials this admission: Ceftriaxone  1g 12/2 >>  Vancomycin 2.25g; 1g qHD 12/2>>    Microbiology results: 12/2 BCx: NGTD 12/2 Sputum: NGTD  12/3 Cath tip cx: pending  Thank you for allowing pharmacy to be a part of this patient's care.  Tawnya Crook, PharmD 12/07/2019 2:01 PM

## 2019-12-07 NOTE — Progress Notes (Signed)
[facility], [place] 12/07/19  Subjective:   LOS: 2 Patient known to our practice from previous admissions.Presents to ER for shortness of breath that started in the middle of the night.He did not go to his dialysis treatment but came to the ER for evaluation.Upon presentation, blood pressure was 222/102.Patient was given Nitropaste and placed on BiPAP. Patient was urgently dialyzed on December 2, 2 L of fluid was removed.Post dialysis, Patient experienced confusion and had a temperature of 103. PermCath infection was suspected.Vascular surgery was consulted and PermCath was removed on 12/06/2019.  Patient sitting up in a chair, close to bed.  Son at bedside.  Patient denies shortness of breath, nausea, or vomiting.  He stays afebrile today.      Objective:  Vital signs in last 24 hours:  Temp:  [98.2 F (36.8 C)-98.7 F (37.1 C)] 98.4 F (36.9 C) (12/04 1133) Pulse Rate:  [85-105] 85 (12/04 1133) Resp:  [16-20] 18 (12/04 1133) BP: (134-176)/(69-88) 150/69 (12/04 1133) SpO2:  [99 %-100 %] 100 % (12/04 1133) Weight:  [76.6 kg] 76.6 kg (12/04 0647)  Weight change:  Filed Weights   12/05/19 0405 12/07/19 0647  Weight: 104.7 kg 76.6 kg    Intake/Output:    Intake/Output Summary (Last 24 hours) at 12/07/2019 1225 Last data filed at 12/07/2019 0710 Gross per 24 hour  Intake -  Output 1550 ml  Net -1550 ml    Physical Exam: General:  Appears pleasant and comfortable  HEENT  normocephalic, atraumatic, sclerae and conjunctivae clear  Pulm/lungs  lungs clear to auscultation bilaterally, normal respiratory effort  CVS/Heart  S1S2, no rubs or gallops  Abdomen:   Soft, nontender, nondistended  Extremities:  No peripheral edema  Neurologic:  Oriented x3, speech clear and appropriate  Skin:  Rash around catheter exit site      Basic Metabolic Panel:  Recent Labs  Lab 12/05/19 0402 12/06/19 0357  NA 135 135  K 5.2* 4.2  CL 101 97*  CO2 21* 22   GLUCOSE 117* 156*  BUN 59* 43*  CREATININE 8.31* 6.37*  CALCIUM 8.5* 8.6*  MG 2.2  --   PHOS 6.6*  --      CBC: Recent Labs  Lab 12/05/19 0402 12/06/19 0357  WBC 16.4* 32.8*  NEUTROABS 13.3*  --   HGB 9.8* 8.3*  HCT 29.5* 25.4*  MCV 93.4 93.7  PLT 368 306      Lab Results  Component Value Date   HEPBSAG NON REACTIVE 05/17/2019   HEPBSAB Non Reactive 01/12/2018   HEPBIGM Negative 01/12/2018      Microbiology:  Recent Results (from the past 240 hour(s))  Resp Panel by RT-PCR (Flu A&B, Covid) Nasopharyngeal Swab     Status: None   Collection Time: 12/05/19  4:02 AM   Specimen: Nasopharyngeal Swab; Nasopharyngeal(NP) swabs in vial transport medium  Result Value Ref Range Status   SARS Coronavirus 2 by RT PCR NEGATIVE NEGATIVE Final    Comment: (NOTE) SARS-CoV-2 target nucleic acids are NOT DETECTED.  The SARS-CoV-2 RNA is generally detectable in upper respiratory specimens during the acute phase of infection. The lowest concentration of SARS-CoV-2 viral copies this assay can detect is 138 copies/mL. A negative result does not preclude SARS-Cov-2 infection and should not be used as the sole basis for treatment or other patient management decisions. A negative result may occur with  improper specimen collection/handling, submission of specimen other than nasopharyngeal swab, presence of viral mutation(s) within the areas targeted by  this assay, and inadequate number of viral copies(<138 copies/mL). A negative result must be combined with clinical observations, patient history, and epidemiological information. The expected result is Negative.  Fact Sheet for Patients:  EntrepreneurPulse.com.au  Fact Sheet for Healthcare Providers:  IncredibleEmployment.be  This test is no t yet approved or cleared by the [place] FDA and  has been authorized for detection and/or diagnosis of SARS-CoV-2 by FDA under an Emergency Use  Authorization (EUA). This EUA will remain  in effect (meaning this test can be used) for the duration of the COVID-19 declaration under Section 564(b)(1) of the Act, 21 U.S.C.section 360bbb-3(b)(1), unless the authorization is terminated  or revoked sooner.       Influenza A by PCR NEGATIVE NEGATIVE Final   Influenza B by PCR NEGATIVE NEGATIVE Final    Comment: (NOTE) The Xpert Xpress SARS-CoV-2/FLU/RSV plus assay is intended as an aid in the diagnosis of influenza from Nasopharyngeal swab specimens and should not be used as a sole basis for treatment. Nasal washings and aspirates are unacceptable for Xpert Xpress SARS-CoV-2/FLU/RSV testing.  Fact Sheet for Patients: EntrepreneurPulse.com.au  Fact Sheet for Healthcare Providers: IncredibleEmployment.be  This test is not yet approved or cleared by the [place] FDA and has been authorized for detection and/or diagnosis of SARS-CoV-2 by FDA under an Emergency Use Authorization (EUA). This EUA will remain in effect (meaning this test can be used) for the duration of the COVID-19 declaration under Section 564(b)(1) of the Act, 21 U.S.C. section 360bbb-3(b)(1), unless the authorization is terminated or revoked.  Performed at [facility], [address] [place], [place] [address]   Culture, blood (Routine X 2) w Reflex to ID Panel     Status: None (Preliminary result)   Collection Time: 12/05/19  6:26 AM   Specimen: BLOOD  Result Value Ref Range Status   Specimen Description BLOOD LEFT ANTECUBITAL  Final   Special Requests   Final    BOTTLES DRAWN AEROBIC AND ANAEROBIC Blood Culture results may not be optimal due to an excessive volume of blood received in culture bottles   Culture   Final    NO GROWTH 2 DAYS Performed at [facility], [address], [place], [place] [address]    Report Status PENDING  Incomplete  Culture, blood (Routine X 2) w Reflex to ID Panel      Status: None (Preliminary result)   Collection Time: 12/05/19  6:26 AM   Specimen: BLOOD  Result Value Ref Range Status   Specimen Description BLOOD RIGHT ANTECUBITAL  Final   Special Requests   Final    BOTTLES DRAWN AEROBIC AND ANAEROBIC Blood Culture results may not be optimal due to an excessive volume of blood received in culture bottles   Culture   Final    NO GROWTH 2 DAYS Performed at [facility], [address] [place], [place] [address]    Report Status PENDING  Incomplete  Cath Tip Culture     Status: None (Preliminary result)   Collection Time: 12/06/19 12:39 PM   Specimen: Catheter Tip; Other  Result Value Ref Range Status   Specimen Description   Final    CATH TIP Performed at [facility], [address], [place], [place] [address]    Special Requests   Final    NONE Performed at [facility], [address] [place], [place] [address]    Culture   Final    TOO YOUNG TO READ Performed at  [facility], [place] [address] [place], [place] [address]    Report Status PENDING  Incomplete    Coagulation Studies: Recent Labs    12/05/19 0402  LABPROT 13.8  INR 1.1    Urinalysis: No results for input(s): COLORURINE, LABSPEC, PHURINE, GLUCOSEU, HGBUR, BILIRUBINUR, KETONESUR, PROTEINUR, UROBILINOGEN, NITRITE, LEUKOCYTESUR in the last 72 hours.  Invalid input(s): APPERANCEUR    Imaging: PERIPHERAL VASCULAR CATHETERIZATION  Result Date: 12/06/2019 See op note    Medications:   . sodium chloride    . cefTRIAXone (ROCEPHIN)  IV Stopped (12/06/19 1715)   . carvedilol  12.5 mg Oral BID WC  . Chlorhexidine Gluconate Cloth  6 each Topical Q0600  . citalopram  20 mg Oral Daily  . epoetin (EPOGEN/PROCRIT) injection  4,000 Units Intravenous Q T,Th,Sa-HD  . ferric citrate  210 mg Oral TID AC  . heparin  5,000 Units Subcutaneous Q8H  . lidocaine  1 patch Transdermal Q12H  . linagliptin  5 mg Oral Daily  . sodium chloride  flush  3 mL Intravenous Q12H  . [START ON 12/10/2019] Vitamin D (Ergocalciferol)  50,000 Units Oral UD   sodium chloride, acetaminophen **OR** acetaminophen, hydrALAZINE, metoprolol tartrate, ondansetron **OR** ondansetron (ZOFRAN) IV, sodium chloride flush, tiZANidine, traZODone  Assessment/ Plan:  64 y.o. male with COPD, diabetes mellitus type 2, BPH, prostate cancer followed bu urology hypertension, hyperlipidemia, ESRD on HD TTS, anemia of chronic kidney disease, secondary hyperparathyroidism  was admitted on 12/05/2019 for  Active Problems:   Hypertensive emergency   Severe sepsis ([place])   COPD exacerbation ([place])   SIRS (systemic inflammatory response syndrome) (HCC)  Hyperkalemia [E87.5] COPD exacerbation (HCC) [J44.1] SIRS (systemic inflammatory response syndrome) (HCC) [R65.10] Hypertensive emergency [I16.1] Acute respiratory failure, unspecified whether with hypoxia or hypercapnia ([place]) [J96.00] Chronic kidney disease with end stage renal failure on dialysis ([place]) [N18.6, Z99.2] Acute on chronic congestive heart failure, unspecified heart failure type ([place]) [I50.9]  [facility] [address]/TTS Left IJ PermCath  #. ESRD #Shortness of breath, hypertensive emergency (malignant HTN)  # Fever Patient received hemodialysis treatment on December 2, with ultrafiltration of 2 L Patient developed fever after dialysis PermCath removed yesterday, allowing a catheter free period during the weekend Volume and electrolyte status acceptable today We will continue monitoring closely Advised patient to maintain fluid restriction  #. Anemia of CKD  Lab Results  Component Value Date   HGB 8.3 (L) 12/06/2019   We will continue lower dose Epogen with dialysis treatments  #. Secondary hyperparathyroidism of renal origin N 25.81      Component Value Date/Time   PTH 138 (H) 05/17/2019 0336   Lab Results  Component Value Date   PHOS 6.6 (H) 12/05/2019   We will continue  monitoring bone mineral metabolism parameters   #. Diabetes type 2 with CKD Hgb A1c MFr Bld (%)  Date Value  12/06/2019 6.2 (H)   Blood glucose readings within acceptable range   LOS: 2 Crosby Oyster 12/4/202112:25 PM  [place], [place]

## 2019-12-07 NOTE — Plan of Care (Signed)
  Problem: Education: Goal: Knowledge of General Education information will improve Description: Including pain rating scale, medication(s)/side effects and non-pharmacologic comfort measures Outcome: Completed/Met   Problem: Activity: Goal: Risk for activity intolerance will decrease Outcome: Completed/Met   Problem: Nutrition: Goal: Adequate nutrition will be maintained Outcome: Completed/Met   Problem: Coping: Goal: Level of anxiety will decrease Outcome: Completed/Met   Problem: Elimination: Goal: Will not experience complications related to bowel motility Outcome: Completed/Met   Problem: Pain Managment: Goal: General experience of comfort will improve Outcome: Completed/Met   Problem: Safety: Goal: Ability to remain free from injury will improve Outcome: Completed/Met   Problem: Skin Integrity: Goal: Risk for impaired skin integrity will decrease Outcome: Completed/Met   Problem: Health Behavior/Discharge Planning: Goal: Ability to manage health-related needs will improve Outcome: Completed/Met   Problem: Health Behavior/Discharge Planning: Goal: Ability to manage health-related needs will improve Outcome: Completed/Met   Problem: Activity: Goal: Risk for activity intolerance will decrease Outcome: Completed/Met   Problem: Nutrition: Goal: Adequate nutrition will be maintained Outcome: Completed/Met

## 2019-12-07 NOTE — Progress Notes (Signed)
[name], male   DOB: Feb 16, 1955, 64 y.o.   MRN: [id number] [facility] PROGRESS NOTE  Daniel Thomas:[id number] DOB: 01/21/1955 DOA: 12/05/2019 PCP: Marguerita Merles, MD  HPI/Subjective: Patient currently feels fine.  Offers no complaints.  No fever or chills.  No shortness of breath.  Initially admitted with shortness of breath then spiked a fever of 103.  Objective: Vitals:   12/07/19 0739 12/07/19 1133  BP: (!) 160/86 (!) 150/69  Pulse: 95 85  Resp: 18 18  Temp: 98.4 F (36.9 C) 98.4 F (36.9 C)  SpO2: 99% 100%    Intake/Output Summary (Last 24 hours) at 12/07/2019 1409 Last data filed at 12/07/2019 0710 Gross per 24 hour  Intake -  Output 1550 ml  Net -1550 ml   Filed Weights   12/05/19 0405 12/07/19 0647  Weight: 104.7 kg 76.6 kg    ROS: Review of Systems  Respiratory: Negative for shortness of breath.   Cardiovascular: Negative for chest pain.  Gastrointestinal: Negative for abdominal pain, nausea and vomiting.   Exam: Physical Exam HENT:     Head: Normocephalic.     Mouth/Throat:     Pharynx: No oropharyngeal exudate.  Eyes:     General: Lids are normal.     Conjunctiva/sclera: Conjunctivae normal.     Pupils: Pupils are equal, round, and reactive to light.  Cardiovascular:     Rate and Rhythm: Normal rate and regular rhythm.     Heart sounds: Normal heart sounds, S1 normal and S2 normal.  Pulmonary:     Breath sounds: No decreased breath sounds, wheezing, rhonchi or rales.  Abdominal:     Palpations: Abdomen is soft.     Tenderness: There is no abdominal tenderness.  Musculoskeletal:     Right lower leg: Swelling present.     Left lower leg: Swelling present.  Skin:    General: Skin is warm.     Findings: No rash.  Neurological:     Mental Status: He is alert and oriented to person, place, and time.       Data Reviewed: Basic Metabolic Panel: Recent Labs  Lab 12/05/19 0402 12/06/19 0357  NA 135 135  K 5.2* 4.2   CL 101 97*  CO2 21* 22  GLUCOSE 117* 156*  BUN 59* 43*  CREATININE 8.31* 6.37*  CALCIUM 8.5* 8.6*  MG 2.2  --   PHOS 6.6*  --    Liver Function Tests: Recent Labs  Lab 12/05/19 0402  AST 26  ALT 26  ALKPHOS 105  BILITOT 1.1  PROT 7.9  ALBUMIN 3.9   CBC: Recent Labs  Lab 12/05/19 0402 12/06/19 0357  WBC 16.4* 32.8*  NEUTROABS 13.3*  --   HGB 9.8* 8.3*  HCT 29.5* 25.4*  MCV 93.4 93.7  PLT 368 306   BNP (last 3 results) Recent Labs    12/05/19 0402  BNP 902.6*      Recent Results (from the past 240 hour(s))  Resp Panel by RT-PCR (Flu A&B, Covid) Nasopharyngeal Swab     Status: None   Collection Time: 12/05/19  4:02 AM   Specimen: Nasopharyngeal Swab; Nasopharyngeal(NP) swabs in vial transport medium  Result Value Ref Range Status   SARS Coronavirus 2 by RT PCR NEGATIVE NEGATIVE Final    Comment: (NOTE) SARS-CoV-2 target nucleic acids are NOT DETECTED.  The SARS-CoV-2 RNA is generally detectable in upper respiratory specimens during the acute phase of infection. The lowest concentration of SARS-CoV-2 viral  copies this assay can detect is 138 copies/mL. A negative result does not preclude SARS-Cov-2 infection and should not be used as the sole basis for treatment or other patient management decisions. A negative result may occur with  improper specimen collection/handling, submission of specimen other than nasopharyngeal swab, presence of viral mutation(s) within the areas targeted by this assay, and inadequate number of viral copies(<138 copies/mL). A negative result must be combined with clinical observations, patient history, and epidemiological information. The expected result is Negative.  Fact Sheet for Patients:  EntrepreneurPulse.com.au  Fact Sheet for Healthcare Providers:  IncredibleEmployment.be  This test is no t yet approved or cleared by the [place] FDA and  has been authorized for detection  and/or diagnosis of SARS-CoV-2 by FDA under an Emergency Use Authorization (EUA). This EUA will remain  in effect (meaning this test can be used) for the duration of the COVID-19 declaration under Section 564(b)(1) of the Act, 21 U.S.C.section 360bbb-3(b)(1), unless the authorization is terminated  or revoked sooner.       Influenza A by PCR NEGATIVE NEGATIVE Final   Influenza B by PCR NEGATIVE NEGATIVE Final    Comment: (NOTE) The Xpert Xpress SARS-CoV-2/FLU/RSV plus assay is intended as an aid in the diagnosis of influenza from Nasopharyngeal swab specimens and should not be used as a sole basis for treatment. Nasal washings and aspirates are unacceptable for Xpert Xpress SARS-CoV-2/FLU/RSV testing.  Fact Sheet for Patients: EntrepreneurPulse.com.au  Fact Sheet for Healthcare Providers: IncredibleEmployment.be  This test is not yet approved or cleared by the [place] FDA and has been authorized for detection and/or diagnosis of SARS-CoV-2 by FDA under an Emergency Use Authorization (EUA). This EUA will remain in effect (meaning this test can be used) for the duration of the COVID-19 declaration under Section 564(b)(1) of the Act, 21 U.S.C. section 360bbb-3(b)(1), unless the authorization is terminated or revoked.  Performed at [facility], [address] [place], [place] [address]   Culture, blood (Routine X 2) w Reflex to ID Panel     Status: None (Preliminary result)   Collection Time: 12/05/19  6:26 AM   Specimen: BLOOD  Result Value Ref Range Status   Specimen Description BLOOD LEFT ANTECUBITAL  Final   Special Requests   Final    BOTTLES DRAWN AEROBIC AND ANAEROBIC Blood Culture results may not be optimal due to an excessive volume of blood received in culture bottles   Culture   Final    NO GROWTH 2 DAYS Performed at [facility], [address], [place], [place] [address]    Report Status PENDING   Incomplete  Culture, blood (Routine X 2) w Reflex to ID Panel     Status: None (Preliminary result)   Collection Time: 12/05/19  6:26 AM   Specimen: BLOOD  Result Value Ref Range Status   Specimen Description BLOOD RIGHT ANTECUBITAL  Final   Special Requests   Final    BOTTLES DRAWN AEROBIC AND ANAEROBIC Blood Culture results may not be optimal due to an excessive volume of blood received in culture bottles   Culture   Final    NO GROWTH 2 DAYS Performed at [facility], [address], [place], [place] [address]    Report Status PENDING  Incomplete  Cath Tip Culture     Status: None (Preliminary result)   Collection Time: 12/06/19 12:39 PM   Specimen: Catheter Tip; Other  Result Value Ref Range Status   Specimen Description  Final    CATH TIP Performed at [facility], [address] [place], [place] [address]    Special Requests   Final    NONE Performed at [facility], [address], [place], [place] [address]    Culture   Final    TOO YOUNG TO READ Performed at [facility], [place] [address], [place], [place] [address]    Report Status PENDING  Incomplete     Studies: PERIPHERAL VASCULAR CATHETERIZATION  Result Date: 12/06/2019 See op note   Scheduled Meds: . carvedilol  12.5 mg Oral BID WC  . Chlorhexidine Gluconate Cloth  6 each Topical Q0600  . citalopram  20 mg Oral Daily  . epoetin (EPOGEN/PROCRIT) injection  4,000 Units Intravenous Q T,Th,Sa-HD  . ferric citrate  210 mg Oral TID AC  . heparin  5,000 Units Subcutaneous Q8H  . lidocaine  1 patch Transdermal Q12H  . linagliptin  5 mg Oral Daily  . sodium chloride flush  3 mL Intravenous Q12H  . vancomycin variable dose per unstable renal function (pharmacist dosing)   Does not apply See admin instructions  . [START ON 12/10/2019] Vitamin D (Ergocalciferol)  50,000 Units Oral UD   Continuous Infusions: . sodium chloride    . cefTRIAXone (ROCEPHIN)  IV Stopped (12/06/19  1715)    Assessment/Plan:  1. Severe sepsis, present on admission with acute hypoxic respiratory failure.  The patient did have a fever of 103 after dialysis on 12/05/2019.  Patient had tachycardia and leukocytosis.  Blood cultures negative so far.  Dialysis catheter removed and tip sent for culture but too early to read.  Continue vancomycin and Rocephin for now.  White blood cell count very elevated.  Recheck white blood cell count tomorrow morning. 2. Acute hypoxic respiratory failure.  This has completely resolved.  Initially the patient required BiPAP.  He also had an ABG showing a pH of 7.3 (less than 7.35).  Likely secondary to sepsis and fluid overload. 3. Acute on chronic diastolic congestive heart failure.  Initial dialysis session helped out with fluid.  Patient also on IV Lasix. 4. COPD exacerbation ruled out.  Steroids discontinued with elevated white count and fever and sepsis.  No wheezing heard today in the lungs. 5. End-stage renal disease.  Had dialysis on 12/05/2019.  Dialysis PermCath removed left chest yesterday.  Since not fluid overloaded hold off on dialysis today and continue to monitor.  Will need PermCath prior to disposition. 6. 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  TSH and free T4 within the normal range.  Likely will need an outpatient biopsy.  Can consider biopsy here if has a prolonged hospital course. 7. Essential hypertension on Coreg, hydralazine, Norvasc and losartan 8. Depression on Celexa 9. Prostate MRI ordered while here but still pending.  Urology wanted this done with elevated PSA.        Code Status:     Code Status Orders  (From admission, onward)         Start     Ordered   12/05/19 0602  Full code  Continuous        12/05/19 0604        Code Status History    Date Active Date Inactive Code Status Order ID Comments User Context   05/12/2019 2331 05/18/2019 0014 Full Code [id number]  Elwyn Reach, MD Inpatient   04/17/2018 1436 04/18/2018 1611  Full Code [id number]  Saundra Shelling, MD Inpatient   01/12/2018 1658  01/16/2018 2207 Full Code [id number]  Bettey Costa, MD Inpatient   01/06/2018 2228 01/08/2018 1455 Full Code [id number]  SalaryAvel Peace, MD ED   Advance Care Planning Activity     Family Communication: Patient's brother at the bedside Disposition Plan: Status is: Inpatient  Dispo: The patient is from: Home              Anticipated d/c is to: Home              Anticipated d/c date is: After infection treated and PermCath placed and dialysis done.  Likely will happen early next week.              Patient currently receiving antibiotics for dialysis catheter infection.  Consultants:  Nephrology  Vascular surgery  Procedures:  Left chest dialysis catheter removed  Antibiotics:  Vancomycin  Rocephin  Time spent: 28 minutes  [place]

## 2019-12-08 DIAGNOSIS — I161 Hypertensive emergency: Secondary | ICD-10-CM | POA: Diagnosis not present

## 2019-12-08 DIAGNOSIS — J9600 Acute respiratory failure, unspecified whether with hypoxia or hypercapnia: Secondary | ICD-10-CM | POA: Diagnosis not present

## 2019-12-08 DIAGNOSIS — A419 Sepsis, unspecified organism: Secondary | ICD-10-CM | POA: Diagnosis not present

## 2019-12-08 DIAGNOSIS — I509 Heart failure, unspecified: Secondary | ICD-10-CM

## 2019-12-08 LAB — CBC
HCT: 24.6 % — ABNORMAL LOW (ref 39.0–52.0)
Hemoglobin: 8.2 g/dL — ABNORMAL LOW (ref 13.0–17.0)
MCH: 30.6 pg (ref 26.0–34.0)
MCHC: 33.3 g/dL (ref 30.0–36.0)
MCV: 91.8 fL (ref 80.0–100.0)
Platelets: 237 10*3/uL (ref 150–400)
RBC: 2.68 MIL/uL — ABNORMAL LOW (ref 4.22–5.81)
RDW: 14.8 % (ref 11.5–15.5)
WBC: 14.4 10*3/uL — ABNORMAL HIGH (ref 4.0–10.5)
nRBC: 0 % (ref 0.0–0.2)

## 2019-12-08 LAB — BASIC METABOLIC PANEL
Anion gap: 17 — ABNORMAL HIGH (ref 5–15)
BUN: 99 mg/dL — ABNORMAL HIGH (ref 8–23)
CO2: 18 mmol/L — ABNORMAL LOW (ref 22–32)
Calcium: 8 mg/dL — ABNORMAL LOW (ref 8.9–10.3)
Chloride: 94 mmol/L — ABNORMAL LOW (ref 98–111)
Creatinine, Ser: 10.95 mg/dL — ABNORMAL HIGH (ref 0.61–1.24)
GFR, Estimated: 5 mL/min — ABNORMAL LOW (ref >60)
Glucose, Bld: 115 mg/dL — ABNORMAL HIGH (ref 70–99)
Potassium: 5 mmol/L (ref 3.5–5.1)
Sodium: 129 mmol/L — ABNORMAL LOW (ref 135–145)

## 2019-12-08 LAB — VANCOMYCIN, RANDOM: Vancomycin Rm: 28

## 2019-12-08 NOTE — Progress Notes (Signed)
[name] [place] NAME: Daniel Thomas    MR#:  [id number]  DATE OF BIRTH:  [birth year]  SUBJECTIVE:  patient came in with acute shortness of breath. And then spiked a fever 103. Perm cath removed. Plans to place it tomorrow.  Patient denies shortness of breath. His sitting out in the chair this morning since his back was bothering him. No documented fever.  REVIEW OF SYSTEMS:   Review of Systems  Constitutional: Negative for chills, fever and weight loss.  HENT: Negative for ear discharge, ear pain and nosebleeds.   Eyes: Negative for blurred vision, pain and discharge.  Respiratory: Negative for sputum production, shortness of breath, wheezing and stridor.   Cardiovascular: Negative for chest pain, palpitations, orthopnea and PND.  Gastrointestinal: Negative for abdominal pain, diarrhea, nausea and vomiting.  Genitourinary: Negative for frequency and urgency.  Musculoskeletal: Negative for back pain and joint pain.  Neurological: Positive for weakness. Negative for sensory change, speech change and focal weakness.  Psychiatric/Behavioral: Negative for depression and hallucinations. The patient is not nervous/anxious.    Tolerating Diet:yes Tolerating PT: independent  DRUG ALLERGIES:  No Known Allergies  VITALS:  Blood pressure (!) 171/87, pulse 83, temperature 98.2 F (36.8 C), resp. rate 16, weight 76.6 kg, SpO2 100 %.  PHYSICAL EXAMINATION:   Physical Exam  GENERAL:  64 y.o.-year-old patient lying in the bed with no acute distress.  HEENT: Head atraumatic, normocephalic. Oropharynx and nasopharynx clear.  LUNGS: Normal breath sounds bilaterally, no wheezing, rales, rhonchi. No use of accessory muscles of respiration.  CARDIOVASCULAR: S1, S2 normal. No murmurs, rubs, or gallops.  ABDOMEN: Soft, nontender, nondistended. Bowel sounds present. No organomegaly or mass.  EXTREMITIES: No cyanosis, clubbing  + edema b/l.     NEUROLOGIC: Cranial nerves II through XII are intact. No focal Motor or sensory deficits b/l.   PSYCHIATRIC:  patient is alert and oriented x 3.  SKIN: No obvious rash, lesion, or ulcer.   LABORATORY PANEL:  CBC Recent Labs  Lab 12/08/19 0603  WBC 14.4*  HGB 8.2*  HCT 24.6*  PLT 237    Chemistries  Recent Labs  Lab 12/05/19 0402 12/06/19 0357 12/08/19 0603  NA 135   < > 129*  K 5.2*   < > 5.0  CL 101   < > 94*  CO2 21*   < > 18*  GLUCOSE 117*   < > 115*  BUN 59*   < > 99*  CREATININE 8.31*   < > 10.95*  CALCIUM 8.5*   < > 8.0*  MG 2.2  --   --   AST 26  --   --   ALT 26  --   --   ALKPHOS 105  --   --   BILITOT 1.1  --   --    < > = values in this interval not displayed.   Cardiac Enzymes No results for input(s): TROPONINI in the last 168 hours. RADIOLOGY:  PERIPHERAL VASCULAR CATHETERIZATION  Result Date: 12/06/2019 See op note  ASSESSMENT AND PLAN:  Daniel Thomas  is a 64 y.o. African-American male with a known history of COPD, type 2 diabetes mellitus, hypertension, dyslipidemia and end-stage renal disease on hemodialysis, who presented to the emergency room with acute onset of respiratory distress with worsening dyspnea.  Severe sepsis present on admission with acute hypoxic respiratory failure -- etiology suspected PD cath infection however PD cath tip and blood culture so far  negative -- sepsis resolved -- patient empirically on vancomycin and Rocephin -- blood culture negative -- cath tip culture negative -- will discussed with nephrology as to how long patient needs antibiotics  Leukocytosis in the setting of sepsis and Solu-Medrol -- came in with white count of 16.4-- 32.8--14.4 -- Solu-Medrol discontinued -- patient afebrile  Acute hypoxic respiratory failure due to COPD exacerbation and congestive heart failure diastolic  -- resolved patient is on room air -- came in with ABG showing pH of 7.3 -- placed on BiPAP-- now on room air -- suspected  due to sepsis and acute on chronic diastolic congestive heart failure -- Solu-Medrol discontinued  Acute on chronic diastolic congestive heart failure with hypoxia -- patient got dialysis with ultrafiltration. Currently on room air.  End-stage renal disease on hemodialysis. -- Dialysis perm cath removed due to suspected infection -- vascular surgery consultation noted. Patient will get perm cath prior to disposition. Dialysis on hold at present. -- Nephrology on board  Thyroid mass seen on CT scan -- TSH and free T4 within normal range -- will defer to PCP Daniel Thomas to arrange outpatient referral to endocrinology. Patient has been informed to follow-up as outpatient.  Elevated PSA -- prostate MRI done results pending. Patient follows with Daniel Thomas as outpatient.  Essential hypertension -on Coreg, hydralazine, Norvasc and losartan  Depression -- on Celexa  Procedures:PC cath removal by vascular on 12/03 Family communication :brother MArdis Consults :nephrology, vascular CODE STATUS: FULL DVT Prophylaxis :Heparin  Status is: Inpatient  Remains inpatient appropriate because:Inpatient level of care appropriate due to severity of illness   Dispo: The patient is from: Home              Anticipated d/c is to: Home              Anticipated d/c date is: 1 day              Patient currently is not medically stable to d/c.  Sepsis resolved Need Perm cath placed on monday      TOTAL TIME TAKING CARE OF THIS PATIENT: 25 minutes.  >50% time spent on counselling and coordination of care  Note: This dictation was prepared with Dragon dictation along with smaller phrase technology. Any transcriptional errors that result from this process are unintentional.  Fritzi Mandes M.D    [facility]   CC: Primary care physician; Daniel Thomas, MDPatient ID: Daniel Thomas, male   DOB: Mar 21, 1955, 64 y.o.   MRN: [id number]

## 2019-12-08 NOTE — Consult Note (Signed)
Pharmacy Antibiotic Note  Daniel Thomas is a 64 y.o. male admitted on 12/05/2019 with unclear source of infection but with long-term indwelling catheter in place ISO SIRS.  Pharmacy has been consulted for Vancomycin dosing after pt experienced fever of 101F post-HD 12/2 (1030-1330). 2L out recorded on this session. Patient received Vancomycin 2.25g (~22mg /kg) loading dose x1 (12/2 1515) and has no additional recorded outs.  Assessment: Vancomycin load 12/2. PermCath removed following concern for possible infection given fever after dialysis. Last HD session 12/2. Allowing catheter free period during the weekend.  12/4:  Vanc random @ 1437 = 28 mcg/mL (~48 hrs post loading dose)  Plan: 12/5:  Vanc random drawn on 12/4 @ 1437 = 28.   No additional vanc needed.  Will recheck Vanc level on 12/6 with AM labs.   Weight: 76.6 kg (168 lb 14 oz)  Temp (24hrs), Avg:98 F (36.7 C), Min:97.3 F (36.3 C), Max:98.6 F (37 C)  Recent Labs  Lab 12/05/19 0402 12/06/19 0357 12/07/19 1437 12/08/19 0603  WBC 16.4* 32.8*  --  14.4*  CREATININE 8.31* 6.37*  --  10.95*  VANCORANDOM  --   --  28  --     Estimated Creatinine Clearance: 6.1 mL/min (A) (by C-G formula based on SCr of 10.95 mg/dL (H)).    No Known Allergies  Antimicrobials this admission: Ceftriaxone  1g 12/2 >>  Vancomycin 2.25g; 1g qHD 12/2>>    Microbiology results: 12/2 BCx: NGTD 12/2 Sputum: NGTD  12/3 Cath tip cx: pending  Thank you for allowing pharmacy to be a part of this patient's care.  Robbins,Jason D, PharmD 12/08/2019 7:03 AM

## 2019-12-08 NOTE — Progress Notes (Signed)
[facility], [place] 12/08/19  Subjective:   LOS: 3 Patient known to our practice from previous admissions.Presents to ER for shortness of breath that started in the middle of the night.He did not go to his dialysis treatment but came to the ER for evaluation.Upon presentation, blood pressure was 222/102.Patient was given Nitropaste and placed on BiPAP. Patient was urgently dialyzed on December 2, 2 L of fluid was removed.Post dialysis, patient experienced confusion and had a temperature of 103. PermCath infection was suspected.Vascular surgery was consulted and PermCath was removed on 12/06/2019.  Patient denies fever/chills, nausea, vomiting or SOB. No s/s of volume overload noted.  We will plan for Permcath placement tomorrow .    Objective:  Vital signs in last 24 hours:  Temp:  [97.3 F (36.3 C)-98.6 F (37 C)] 97.3 F (36.3 C) (12/05 1143) Pulse Rate:  [78-87] 78 (12/05 1143) Resp:  [16-17] 16 (12/05 1143) BP: (132-171)/(60-87) 144/60 (12/05 1143) SpO2:  [97 %-100 %] 100 % (12/05 1143)  Weight change:  Filed Weights   12/05/19 0405 12/07/19 0647  Weight: 104.7 kg 76.6 kg    Intake/Output:    Intake/Output Summary (Last 24 hours) at 12/08/2019 1302 Last data filed at 12/08/2019 0740 Gross per 24 hour  Intake 336.99 ml  Output --  Net 336.99 ml    Physical Exam: General:  Sitting up in a chair,in no acute distress  HEENT  Sclerae and conjunctivae clear  Pulm/lungs  Normal respiratory effort,Lungs clear  CVS/Heart  Regular rate and rhythm  Abdomen:   Soft, nontender, nondistended  Extremities:  No peripheral edema  Neurologic:  speech clear and appropriate  Skin:  Rashes +  around catheter site       Basic Metabolic Panel:  Recent Labs  Lab 12/05/19 0402 12/06/19 0357 12/08/19 0603  NA 135 135 129*  K 5.2* 4.2 5.0  CL 101 97* 94*  CO2 21* 22 18*  GLUCOSE 117* 156* 115*  BUN 59* 43* 99*  CREATININE 8.31* 6.37* 10.95*   CALCIUM 8.5* 8.6* 8.0*  MG 2.2  --   --   PHOS 6.6*  --   --      CBC: Recent Labs  Lab 12/05/19 0402 12/06/19 0357 12/08/19 0603  WBC 16.4* 32.8* 14.4*  NEUTROABS 13.3*  --   --   HGB 9.8* 8.3* 8.2*  HCT 29.5* 25.4* 24.6*  MCV 93.4 93.7 91.8  PLT 368 306 237      Lab Results  Component Value Date   HEPBSAG NON REACTIVE 05/17/2019   HEPBSAB Non Reactive 01/12/2018   HEPBIGM Negative 01/12/2018      Microbiology:  Recent Results (from the past 240 hour(s))  Resp Panel by RT-PCR (Flu A&B, Covid) Nasopharyngeal Swab     Status: None   Collection Time: 12/05/19  4:02 AM   Specimen: Nasopharyngeal Swab; Nasopharyngeal(NP) swabs in vial transport medium  Result Value Ref Range Status   SARS Coronavirus 2 by RT PCR NEGATIVE NEGATIVE Final    Comment: (NOTE) SARS-CoV-2 target nucleic acids are NOT DETECTED.  The SARS-CoV-2 RNA is generally detectable in upper respiratory specimens during the acute phase of infection. The lowest concentration of SARS-CoV-2 viral copies this assay can detect is 138 copies/mL. A negative result does not preclude SARS-Cov-2 infection and should not be used as the sole basis for treatment or other patient management decisions. A negative result may occur with  improper specimen collection/handling, submission of specimen other than nasopharyngeal swab, presence  of viral mutation(s) within the areas targeted by this assay, and inadequate number of viral copies(<138 copies/mL). A negative result must be combined with clinical observations, patient history, and epidemiological information. The expected result is Negative.  Fact Sheet for Patients:  EntrepreneurPulse.com.au  Fact Sheet for Healthcare Providers:  IncredibleEmployment.be  This test is no t yet approved or cleared by the [place] FDA and  has been authorized for detection and/or diagnosis of SARS-CoV-2 by FDA under an Emergency Use  Authorization (EUA). This EUA will remain  in effect (meaning this test can be used) for the duration of the COVID-19 declaration under Section 564(b)(1) of the Act, 21 U.S.C.section 360bbb-3(b)(1), unless the authorization is terminated  or revoked sooner.       Influenza A by PCR NEGATIVE NEGATIVE Final   Influenza B by PCR NEGATIVE NEGATIVE Final    Comment: (NOTE) The Xpert Xpress SARS-CoV-2/FLU/RSV plus assay is intended as an aid in the diagnosis of influenza from Nasopharyngeal swab specimens and should not be used as a sole basis for treatment. Nasal washings and aspirates are unacceptable for Xpert Xpress SARS-CoV-2/FLU/RSV testing.  Fact Sheet for Patients: EntrepreneurPulse.com.au  Fact Sheet for Healthcare Providers: IncredibleEmployment.be  This test is not yet approved or cleared by the [place] FDA and has been authorized for detection and/or diagnosis of SARS-CoV-2 by FDA under an Emergency Use Authorization (EUA). This EUA will remain in effect (meaning this test can be used) for the duration of the COVID-19 declaration under Section 564(b)(1) of the Act, 21 U.S.C. section 360bbb-3(b)(1), unless the authorization is terminated or revoked.  Performed at [facility], [address] [place], [place] [address]   Culture, blood (Routine X 2) w Reflex to ID Panel     Status: None (Preliminary result)   Collection Time: 12/05/19  6:26 AM   Specimen: BLOOD  Result Value Ref Range Status   Specimen Description BLOOD LEFT ANTECUBITAL  Final   Special Requests   Final    BOTTLES DRAWN AEROBIC AND ANAEROBIC Blood Culture results may not be optimal due to an excessive volume of blood received in culture bottles   Culture   Final    NO GROWTH 3 DAYS Performed at [facility], [address] [place], [place] [address]    Report Status PENDING  Incomplete  Culture, blood (Routine X 2) w Reflex to ID Panel      Status: None (Preliminary result)   Collection Time: 12/05/19  6:26 AM   Specimen: BLOOD  Result Value Ref Range Status   Specimen Description BLOOD RIGHT ANTECUBITAL  Final   Special Requests   Final    BOTTLES DRAWN AEROBIC AND ANAEROBIC Blood Culture results may not be optimal due to an excessive volume of blood received in culture bottles   Culture   Final    NO GROWTH 3 DAYS Performed at [facility], [address], [place], [place] [address]    Report Status PENDING  Incomplete  Cath Tip Culture     Status: None (Preliminary result)   Collection Time: 12/06/19 12:39 PM   Specimen: Catheter Tip; Other  Result Value Ref Range Status   Specimen Description   Final    CATH TIP Performed at [facility], [address] [place], [place] [address]    Special Requests   Final    NONE Performed at [facility], [address], [place], [place] [address]    Culture   Final  TOO YOUNG TO READ Performed at [facility], [place] [address], [place], [place] [address]    Report Status PENDING  Incomplete    Coagulation Studies: No results for input(s): LABPROT, INR in the last 72 hours.  Urinalysis: No results for input(s): COLORURINE, LABSPEC, PHURINE, GLUCOSEU, HGBUR, BILIRUBINUR, KETONESUR, PROTEINUR, UROBILINOGEN, NITRITE, LEUKOCYTESUR in the last 72 hours.  Invalid input(s): APPERANCEUR    Imaging: No results found.   Medications:   . sodium chloride    . cefTRIAXone (ROCEPHIN)  IV 1 g (12/07/19 1621)   . carvedilol  12.5 mg Oral BID WC  . Chlorhexidine Gluconate Cloth  6 each Topical Q0600  . citalopram  20 mg Oral Daily  . epoetin (EPOGEN/PROCRIT) injection  4,000 Units Intravenous Q T,Th,Sa-HD  . ferric citrate  210 mg Oral TID AC  . heparin  5,000 Units Subcutaneous Q8H  . lidocaine  1 patch Transdermal Q12H  . linagliptin  5 mg Oral Daily  . sodium chloride flush  3 mL Intravenous Q12H  . vancomycin variable dose per  unstable renal function (pharmacist dosing)   Does not apply See admin instructions  . [START ON 12/10/2019] Vitamin D (Ergocalciferol)  50,000 Units Oral UD   sodium chloride, acetaminophen **OR** acetaminophen, hydrALAZINE, metoprolol tartrate, ondansetron **OR** ondansetron (ZOFRAN) IV, sodium chloride flush, tiZANidine, traZODone  Assessment/ Plan:  64 y.o. male with COPD, diabetes mellitus type 2, BPH, prostate cancer followed bu urology hypertension, hyperlipidemia, ESRD on HD TTS, anemia of chronic kidney disease, secondary hyperparathyroidism  was admitted on 12/05/2019 for  Active Problems:   Hypertensive emergency   Severe sepsis ([place])   COPD exacerbation ([place])   Acute respiratory failure (H[facility])   SIRS (systemic inflammatory response syndrome) (HCC)   Acute on chronic congestive heart failure (HCC)  Hyperkalemia [E87.5] COPD exacerbation (H[facility]) [J44.1] SIRS (systemic inflammatory response syndrome) ([place]) [R65.10] Hypertensive emergency [I16.1] Acute respiratory failure, unspecified whether with hypoxia or hypercapnia ([place]) [J96.00] Chronic kidney disease with end stage renal failure on dialysis ([place]) [N18.6, Z99.2] Acute on chronic congestive heart failure, unspecified heart failure type ([place]) [I50.9]  [facility] [address]/TTS Left IJ PermCath  #. ESRD #Shortness of breath, hypertensive emergency (malignant HTN)  # Fever Last hemodialysis was on 12/05/2019 Catheter removed due to fever,allowing catheter free period Patient is on Vancomycin and Ceftriaxone Stays Afebrile Blood culture with no growth in 3 days Catheter tip culture pending Volume and electrolyte status acceptable We will plan for Permcath placement tomorrow  #. Anemia of CKD  Lab Results  Component Value Date   HGB 8.2 (L) 12/08/2019   Epogen 4000 units with dialysis treatments TTS  #. Secondary hyperparathyroidism of renal origin N 25.81      Component Value Date/Time   PTH 138 (H)  05/17/2019 0336   Lab Results  Component Value Date   PHOS 6.6 (H) 12/05/2019      #. Diabetes type 2 with CKD Hgb A1c MFr Bld (%)  Date Value  12/06/2019 6.2 (H)   Blood glucose 115 this morning   LOS: 3 Daniel Thomas 12/5/20211:02 PM  [place], [place]

## 2019-12-08 NOTE — Consult Note (Signed)
Pharmacy Antibiotic Note  Daniel Thomas is a 64 y.o. male admitted on 12/05/2019 with unclear source of infection but with long-term indwelling catheter in place ISO SIRS.  Pharmacy has been consulted for Vancomycin dosing after pt experienced fever of 101F post-HD 12/2 (1030-1330). 2L out recorded on this session. Patient received Vancomycin 2.25g (~22mg /kg) loading dose x1 (12/2 1515) and has no additional recorded outs.  Assessment: Vancomycin load 12/2. PermCath removed following concern for possible infection given fever after dialysis. Last HD session 12/2. Allowing catheter free period during the weekend.  12/4:  Vanc random @ 1437 = 28 mcg/mL (~48 hrs post loading dose)  Plan: 12/5:  Vanc random drawn on 12/4 @ 1437 = 28.   No additional vanc needed.  Will recheck vanc in 24 hr on 12/5 @ 1400.   Weight: 76.6 kg (168 lb 14 oz)  Temp (24hrs), Avg:98 F (36.7 C), Min:97.3 F (36.3 C), Max:98.4 F (36.9 C)  Recent Labs  Lab 12/05/19 0402 12/06/19 0357 12/07/19 1437  WBC 16.4* 32.8*  --   CREATININE 8.31* 6.37*  --   VANCORANDOM  --   --  28    Estimated Creatinine Clearance: 10.4 mL/min (A) (by C-G formula based on SCr of 6.37 mg/dL (H)).    No Known Allergies  Antimicrobials this admission: Ceftriaxone  1g 12/2 >>  Vancomycin 2.25g; 1g qHD 12/2>>    Microbiology results: 12/2 BCx: NGTD 12/2 Sputum: NGTD  12/3 Cath tip cx: pending  Thank you for allowing pharmacy to be a part of this patient's care.  Robbins,Jason D, PharmD 12/08/2019 2:03 AM

## 2019-12-09 DIAGNOSIS — J9601 Acute respiratory failure with hypoxia: Secondary | ICD-10-CM | POA: Diagnosis not present

## 2019-12-09 DIAGNOSIS — T827XXA Infection and inflammatory reaction due to other cardiac and vascular devices, implants and grafts, initial encounter: Secondary | ICD-10-CM

## 2019-12-09 DIAGNOSIS — B9562 Methicillin resistant Staphylococcus aureus infection as the cause of diseases classified elsewhere: Secondary | ICD-10-CM

## 2019-12-09 DIAGNOSIS — A419 Sepsis, unspecified organism: Secondary | ICD-10-CM | POA: Diagnosis not present

## 2019-12-09 DIAGNOSIS — J9600 Acute respiratory failure, unspecified whether with hypoxia or hypercapnia: Secondary | ICD-10-CM | POA: Diagnosis not present

## 2019-12-09 DIAGNOSIS — L2389 Allergic contact dermatitis due to other agents: Secondary | ICD-10-CM

## 2019-12-09 DIAGNOSIS — I161 Hypertensive emergency: Secondary | ICD-10-CM | POA: Diagnosis not present

## 2019-12-09 DIAGNOSIS — I509 Heart failure, unspecified: Secondary | ICD-10-CM | POA: Diagnosis not present

## 2019-12-09 DIAGNOSIS — I132 Hypertensive heart and chronic kidney disease with heart failure and with stage 5 chronic kidney disease, or end stage renal disease: Secondary | ICD-10-CM | POA: Diagnosis not present

## 2019-12-09 LAB — BASIC METABOLIC PANEL
Anion gap: 18 — ABNORMAL HIGH (ref 5–15)
BUN: 113 mg/dL — ABNORMAL HIGH (ref 8–23)
CO2: 17 mmol/L — ABNORMAL LOW (ref 22–32)
Calcium: 8 mg/dL — ABNORMAL LOW (ref 8.9–10.3)
Chloride: 93 mmol/L — ABNORMAL LOW (ref 98–111)
Creatinine, Ser: 12.09 mg/dL — ABNORMAL HIGH (ref 0.61–1.24)
GFR, Estimated: 4 mL/min — ABNORMAL LOW (ref >60)
Glucose, Bld: 108 mg/dL — ABNORMAL HIGH (ref 70–99)
Potassium: 5.2 mmol/L — ABNORMAL HIGH (ref 3.5–5.1)
Sodium: 128 mmol/L — ABNORMAL LOW (ref 135–145)

## 2019-12-09 LAB — CBC
HCT: 24.8 % — ABNORMAL LOW (ref 39.0–52.0)
Hemoglobin: 8.4 g/dL — ABNORMAL LOW (ref 13.0–17.0)
MCH: 30.5 pg (ref 26.0–34.0)
MCHC: 33.9 g/dL (ref 30.0–36.0)
MCV: 90.2 fL (ref 80.0–100.0)
Platelets: 183 10*3/uL (ref 150–400)
RBC: 2.75 MIL/uL — ABNORMAL LOW (ref 4.22–5.81)
RDW: 14.6 % (ref 11.5–15.5)
WBC: 9.3 10*3/uL (ref 4.0–10.5)
nRBC: 0 % (ref 0.0–0.2)

## 2019-12-09 LAB — PHOSPHORUS: Phosphorus: 7.8 mg/dL — ABNORMAL HIGH (ref 2.5–4.6)

## 2019-12-09 LAB — VANCOMYCIN, RANDOM: Vancomycin Rm: 22

## 2019-12-09 MED ORDER — PATIROMER SORBITEX CALCIUM 8.4 G PO PACK
16.8000 g | PACK | Freq: Once | ORAL | Status: AC
  Administered 2019-12-09: 16.8 g via ORAL
  Filled 2019-12-09: qty 2

## 2019-12-09 MED ORDER — CHLORHEXIDINE GLUCONATE CLOTH 2 % EX PADS
6.0000 | MEDICATED_PAD | Freq: Every day | CUTANEOUS | Status: DC
  Administered 2019-12-10 – 2019-12-11 (×2): 6 via TOPICAL

## 2019-12-09 MED ORDER — MUPIROCIN 2 % EX OINT
TOPICAL_OINTMENT | Freq: Two times a day (BID) | CUTANEOUS | Status: DC
  Filled 2019-12-09: qty 22

## 2019-12-09 MED ORDER — HYDRALAZINE HCL 25 MG PO TABS
25.0000 mg | ORAL_TABLET | Freq: Three times a day (TID) | ORAL | Status: DC
  Administered 2019-12-09 – 2019-12-11 (×5): 25 mg via ORAL
  Filled 2019-12-09 (×5): qty 1

## 2019-12-09 NOTE — Progress Notes (Signed)
[facility] NAME: Lytle Malburg    MR#:  [id number]  DATE OF BIRTH:  [birth year]  SUBJECTIVE:  patient came in with acute shortness of breath. And then spiked a fever 103. Perm cath removed.   Patient denies shortness of breath.  No documented fever. He is asking when cath will be placed  REVIEW OF SYSTEMS:   Review of Systems  Constitutional: Negative for chills, fever and weight loss.  HENT: Negative for ear discharge, ear pain and nosebleeds.   Eyes: Negative for blurred vision, pain and discharge.  Respiratory: Negative for sputum production, shortness of breath, wheezing and stridor.   Cardiovascular: Negative for chest pain, palpitations, orthopnea and PND.  Gastrointestinal: Negative for abdominal pain, diarrhea, nausea and vomiting.  Genitourinary: Negative for frequency and urgency.  Musculoskeletal: Negative for back pain and joint pain.  Neurological: Positive for weakness. Negative for sensory change, speech change and focal weakness.  Psychiatric/Behavioral: Negative for depression and hallucinations. The patient is not nervous/anxious.    Tolerating Diet:yes Tolerating PT: independent  DRUG ALLERGIES:  No Known Allergies  VITALS:  Blood pressure (!) 169/75, pulse 77, temperature 98.3 F (36.8 C), resp. rate 17, height 5\' 1"  (1.549 m), weight 76.6 kg, SpO2 99 %.  PHYSICAL EXAMINATION:   Physical Exam  GENERAL:  64 y.o.-year-old patient lying in the bed with no acute distress.  HEENT: Head atraumatic, normocephalic. Oropharynx and nasopharynx clear.  LUNGS: Normal breath sounds bilaterally, no wheezing, rales, rhonchi. No use of accessory muscles of respiration.  CARDIOVASCULAR: S1, S2 normal. No murmurs, rubs, or gallops.  ABDOMEN: Soft, nontender, nondistended. Bowel sounds present. No organomegaly or mass.  EXTREMITIES: No cyanosis, clubbing  + edema b/l.    NEUROLOGIC: Cranial nerves II through XII are  intact. No focal Motor or sensory deficits b/l.   PSYCHIATRIC:  patient is alert and oriented x 3.  SKIN: No obvious rash, lesion, or ulcer.   LABORATORY PANEL:  CBC Recent Labs  Lab 12/08/19 0603  WBC 14.4*  HGB 8.2*  HCT 24.6*  PLT 237    Chemistries  Recent Labs  Lab 12/05/19 0402 12/06/19 0357 12/08/19 0603  NA 135   < > 129*  K 5.2*   < > 5.0  CL 101   < > 94*  CO2 21*   < > 18*  GLUCOSE 117*   < > 115*  BUN 59*   < > 99*  CREATININE 8.31*   < > 10.95*  CALCIUM 8.5*   < > 8.0*  MG 2.2  --   --   AST 26  --   --   ALT 26  --   --   ALKPHOS 105  --   --   BILITOT 1.1  --   --    < > = values in this interval not displayed.   Cardiac Enzymes No results for input(s): TROPONINI in the last 168 hours. RADIOLOGY:  No results found. ASSESSMENT AND PLAN:  Zayed Griffie  is a 64 y.o. African-American male with a known history of COPD, type 2 diabetes mellitus, hypertension, dyslipidemia and end-stage renal disease on hemodialysis, who presented to the emergency room with acute onset of respiratory distress with worsening dyspnea.  Severe sepsis present on admission with acute hypoxic respiratory failure -- etiology suspected  cath infection  -- cath tip positive for Staph aureus --ID consult placed --blood culture so far negative --repeat [facility] 12/6 -- sepsis  resolved -- patient empirically on vancomycin and Rocephin  Leukocytosis in the setting of sepsis and Solu-Medrol -- came in with white count of 16.4-- 32.8--14.4 -- Solu-Medrol discontinued -- patient afebrile  Acute hypoxic respiratory failure due to COPD exacerbation and congestive heart failure diastolic  -- resolved patient is on room air -- came in with ABG showing pH of 7.3 -- placed on BiPAP-- now on room air -- suspected due to sepsis and acute on chronic diastolic congestive heart failure and required urgent HD with UF  Acute on chronic diastolic congestive heart failure with hypoxia -- patient  got dialysis with ultrafiltration at admission -- Currently on room air.  End-stage renal disease on hemodialysis. -- Dialysis perm cath removed due to staph infection -- vascular surgery consultation noted. -- Patient will get perm cath prior to disposition. Dialysis on hold at present.  Thyroid mass seen on CT scan -- TSH and free T4 within normal range -- will defer to PCP Dr. Lennox Grumbles to arrange outpatient referral to endocrinology. Patient has been informed to follow-up as outpatient.  Elevated PSA -- prostate MRI done--results seen --  Dr. Kathrin Greathouse informed via secure chat of results and have pt f/u as outpt for further evaluaiton as outpatient.  Essential hypertension -on Coreg, hydralazine, Norvasc and losartan  Depression -- on Celexa  Procedures:PC cath removal by vascular on 12/03 Family communication :brother [name] on the phone Consults :nephrology, vascular, ID CODE STATUS: FULL DVT Prophylaxis :Heparin  Status is: Inpatient  Remains inpatient appropriate because:Inpatient level of care appropriate due to severity of illness   Dispo: The patient is from: Home              Anticipated d/c is to: Home              Anticipated d/c date is: 1-2              Patient currently is not medically stable to d/c.  Sepsis resolved Need Perm cath placed once ID decides on further recommendations    TOTAL TIME TAKING CARE OF THIS PATIENT: 25 minutes.  >50% time spent on counselling and coordination of care  Note: This dictation was prepared with Dragon dictation along with smaller phrase technology. Any transcriptional errors that result from this process are unintentional.  Fritzi Mandes M.D    [facility]   CC: Primary care physician; Marguerita Merles, MDPatient ID: Henriette Combs, male   DOB: 12-20-1955, 64 y.o.   MRN: [id number]

## 2019-12-09 NOTE — Progress Notes (Signed)
Spoke with Dr. Delaine Lame about dressings to use for the [facility] sites. I would recommend trying op-site. This is the  dressings that is used in the OR. Then if the rash still persists would consider changing the use of Chlorhexidine to clean the site to alcohol and betadine . May be a combination of the adhesive and scrub.

## 2019-12-09 NOTE — Plan of Care (Signed)
°  Problem: Clinical Measurements: °Goal: Ability to maintain clinical measurements within normal limits will improve °Outcome: Progressing °Goal: Will remain free from infection °Outcome: Progressing °Goal: Diagnostic test results will improve °Outcome: Progressing °Goal: Respiratory complications will improve °Outcome: Progressing °  °

## 2019-12-09 NOTE — Progress Notes (Addendum)
[facility], [place] 12/09/19  Subjective:   LOS: 4 Patient known to our practice from previous admissions.Presents to ER for shortness of breath that started in the middle of the night.He did not go to his dialysis treatment but came to the ER for evaluation.Upon presentation, blood pressure was 222/102.Patient was given Nitropaste and placed on BiPAP. Patient was urgently dialyzed on December 2, 2 L of fluid was removed.Post dialysis, patient experienced confusion and had a temperature of 103. PermCath infection was suspected.Vascular surgery was consulted and PermCath was removed on 12/06/2019.  Update  Patient appears comfortable, sitting in a chair in the room,denies worsening SOB,nausea or vomiting. We were planning for Permcath placement today, however his catheter tip culture came back positive. So procedure cancelled.   Objective:  Vital signs in last 24 hours:  Temp:  [97.7 F (36.5 C)-98.9 F (37.2 C)] 98.2 F (36.8 C) (12/06 1129) Pulse Rate:  [73-82] 79 (12/06 1233) Resp:  [16-18] 16 (12/06 1129) BP: (141-192)/(67-92) 185/81 (12/06 1233) SpO2:  [99 %-100 %] 99 % (12/06 1129)  Weight change:  Filed Weights   12/05/19 0405 12/07/19 0647  Weight: 104.7 kg 76.6 kg    Intake/Output:    Intake/Output Summary (Last 24 hours) at 12/09/2019 1427 Last data filed at 12/09/2019 1300 Gross per 24 hour  Intake 30.12 ml  Output --  Net 30.12 ml    Physical Exam: General:  In no acute distress  HEENT  Anicteric  Pulm/lungs  Lungs with fine crackles at the bases, normal respiratory effort  CVS/Heart  S1S2,no rubs or gallops  Abdomen:   Soft, nontender, nondistended  Extremities:  Trace  peripheral edema  Neurologic:  Awake, alert,oriented  Skin:  Rashes +  around catheter site       Basic Metabolic Panel:  Recent Labs  Lab 12/05/19 0402 12/05/19 0402 12/06/19 0357 12/08/19 0603 12/09/19 0832  NA 135  --  135 129* 128*  K 5.2*  --   4.2 5.0 5.2*  CL 101  --  97* 94* 93*  CO2 21*  --  22 18* 17*  GLUCOSE 117*  --  156* 115* 108*  BUN 59*  --  43* 99* 113*  CREATININE 8.31*  --  6.37* 10.95* 12.09*  CALCIUM 8.5*   < > 8.6* 8.0* 8.0*  MG 2.2  --   --   --   --   PHOS 6.6*  --   --   --  7.8*   < > = values in this interval not displayed.     CBC: Recent Labs  Lab 12/05/19 0402 12/06/19 0357 12/08/19 0603 12/09/19 0832  WBC 16.4* 32.8* 14.4* 9.3  NEUTROABS 13.3*  --   --   --   HGB 9.8* 8.3* 8.2* 8.4*  HCT 29.5* 25.4* 24.6* 24.8*  MCV 93.4 93.7 91.8 90.2  PLT 368 306 237 183      Lab Results  Component Value Date   HEPBSAG NON REACTIVE 05/17/2019   HEPBSAB Non Reactive 01/12/2018   HEPBIGM Negative 01/12/2018      Microbiology:  Recent Results (from the past 240 hour(s))  Resp Panel by RT-PCR (Flu A&B, Covid) Nasopharyngeal Swab     Status: None   Collection Time: 12/05/19  4:02 AM   Specimen: Nasopharyngeal Swab; Nasopharyngeal(NP) swabs in vial transport medium  Result Value Ref Range Status   SARS Coronavirus 2 by RT PCR NEGATIVE NEGATIVE Final    Comment: (NOTE) SARS-CoV-2 target nucleic  acids are NOT DETECTED.  The SARS-CoV-2 RNA is generally detectable in upper respiratory specimens during the acute phase of infection. The lowest concentration of SARS-CoV-2 viral copies this assay can detect is 138 copies/mL. A negative result does not preclude SARS-Cov-2 infection and should not be used as the sole basis for treatment or other patient management decisions. A negative result may occur with  improper specimen collection/handling, submission of specimen other than nasopharyngeal swab, presence of viral mutation(s) within the areas targeted by this assay, and inadequate number of viral copies(<138 copies/mL). A negative result must be combined with clinical observations, patient history, and epidemiological information. The expected result is Negative.  Fact Sheet for Patients:   EntrepreneurPulse.com.au  Fact Sheet for Healthcare Providers:  IncredibleEmployment.be  This test is no t yet approved or cleared by the [place] FDA and  has been authorized for detection and/or diagnosis of SARS-CoV-2 by FDA under an Emergency Use Authorization (EUA). This EUA will remain  in effect (meaning this test can be used) for the duration of the COVID-19 declaration under Section 564(b)(1) of the Act, 21 U.S.C.section 360bbb-3(b)(1), unless the authorization is terminated  or revoked sooner.       Influenza A by PCR NEGATIVE NEGATIVE Final   Influenza B by PCR NEGATIVE NEGATIVE Final    Comment: (NOTE) The Xpert Xpress SARS-CoV-2/FLU/RSV plus assay is intended as an aid in the diagnosis of influenza from Nasopharyngeal swab specimens and should not be used as a sole basis for treatment. Nasal washings and aspirates are unacceptable for Xpert Xpress SARS-CoV-2/FLU/RSV testing.  Fact Sheet for Patients: EntrepreneurPulse.com.au  Fact Sheet for Healthcare Providers: IncredibleEmployment.be  This test is not yet approved or cleared by the [place] FDA and has been authorized for detection and/or diagnosis of SARS-CoV-2 by FDA under an Emergency Use Authorization (EUA). This EUA will remain in effect (meaning this test can be used) for the duration of the COVID-19 declaration under Section 564(b)(1) of the Act, 21 U.S.C. section 360bbb-3(b)(1), unless the authorization is terminated or revoked.  Performed at [facility], [address] [place], [place] [address]   Culture, blood (Routine X 2) w Reflex to ID Panel     Status: None (Preliminary result)   Collection Time: 12/05/19  6:26 AM   Specimen: BLOOD  Result Value Ref Range Status   Specimen Description BLOOD LEFT ANTECUBITAL  Final   Special Requests   Final    BOTTLES DRAWN AEROBIC AND ANAEROBIC Blood Culture  results may not be optimal due to an excessive volume of blood received in culture bottles   Culture   Final    NO GROWTH 4 DAYS Performed at [facility], [address], [place], [place] [address]    Report Status PENDING  Incomplete  Culture, blood (Routine X 2) w Reflex to ID Panel     Status: None (Preliminary result)   Collection Time: 12/05/19  6:26 AM   Specimen: BLOOD  Result Value Ref Range Status   Specimen Description BLOOD RIGHT ANTECUBITAL  Final   Special Requests   Final    BOTTLES DRAWN AEROBIC AND ANAEROBIC Blood Culture results may not be optimal due to an excessive volume of blood received in culture bottles   Culture   Final    NO GROWTH 4 DAYS Performed at [facility], [address] [place], [place] [address]    Report Status PENDING  Incomplete  Cath Tip Culture     Status: Abnormal (  Preliminary result)   Collection Time: 12/06/19 12:39 PM   Specimen: Catheter Tip; Other  Result Value Ref Range Status   Specimen Description   Final    CATH TIP Performed at [facility], [address], [place], [place] [address]    Special Requests   Final    NONE Performed at [facility], [address] [place], [place] [address]    Culture (A)  Final    >=100,000 COLONIES/mL METHICILLIN RESISTANT STAPHYLOCOCCUS AUREUS CULTURE REINCUBATED FOR BETTER GROWTH Performed at [facility], [place] [address], [place], [place] [address]    Report Status PENDING  Incomplete   Organism ID, Bacteria METHICILLIN RESISTANT STAPHYLOCOCCUS AUREUS (A)  Final      Susceptibility   Methicillin resistant staphylococcus aureus - MIC*    CIPROFLOXACIN >=8 RESISTANT Resistant     ERYTHROMYCIN >=8 RESISTANT Resistant     GENTAMICIN <=0.5 SENSITIVE Sensitive     OXACILLIN >=4 RESISTANT Resistant     TETRACYCLINE <=1 SENSITIVE Sensitive     VANCOMYCIN 1 SENSITIVE Sensitive     TRIMETH/SULFA <=10 SENSITIVE Sensitive     CLINDAMYCIN >=8  RESISTANT Resistant     RIFAMPIN <=0.5 SENSITIVE Sensitive     Inducible Clindamycin NEGATIVE Sensitive     * >=100,000 COLONIES/mL METHICILLIN RESISTANT STAPHYLOCOCCUS AUREUS    Coagulation Studies: No results for input(s): LABPROT, INR in the last 72 hours.  Urinalysis: No results for input(s): COLORURINE, LABSPEC, PHURINE, GLUCOSEU, HGBUR, BILIRUBINUR, KETONESUR, PROTEINUR, UROBILINOGEN, NITRITE, LEUKOCYTESUR in the last 72 hours.  Invalid input(s): APPERANCEUR    Imaging: No results found.   Medications:   . sodium chloride     . carvedilol  12.5 mg Oral BID WC  . Chlorhexidine Gluconate Cloth  6 each Topical Q0600  . [START ON 12/10/2019] Chlorhexidine Gluconate Cloth  6 each Topical Q0600  . citalopram  20 mg Oral Daily  . epoetin (EPOGEN/PROCRIT) injection  4,000 Units Intravenous Q T,Th,Sa-HD  . ferric citrate  210 mg Oral TID AC  . heparin  5,000 Units Subcutaneous Q8H  . lidocaine  1 patch Transdermal Q12H  . linagliptin  5 mg Oral Daily  . mupirocin ointment   Nasal BID  . sodium chloride flush  3 mL Intravenous Q12H  . vancomycin variable dose per unstable renal function (pharmacist dosing)   Does not apply See admin instructions  . [START ON 12/10/2019] Vitamin D (Ergocalciferol)  50,000 Units Oral UD   sodium chloride, acetaminophen **OR** acetaminophen, hydrALAZINE, metoprolol tartrate, ondansetron **OR** ondansetron (ZOFRAN) IV, sodium chloride flush, tiZANidine, traZODone  Assessment/ Plan:  64 y.o. male with COPD, diabetes mellitus type 2, BPH, prostate cancer followed bu urology hypertension, hyperlipidemia, ESRD on HD TTS, anemia of chronic kidney disease, secondary hyperparathyroidism  was admitted on 12/05/2019 for  Active Problems:   Hypertensive emergency   Severe sepsis ([place])   COPD exacerbation ([place])   Acute respiratory failure (HCC)   SIRS (systemic inflammatory response syndrome) (HCC)   Acute on chronic congestive heart failure  (HCC)  Hyperkalemia [E87.5] COPD exacerbation (HCC) [J44.1] SIRS (systemic inflammatory response syndrome) ([place]) [R65.10] Hypertensive emergency [I16.1] Acute respiratory failure, unspecified whether with hypoxia or hypercapnia ([place]) [J96.00] Chronic kidney disease with end stage renal failure on dialysis ([place]) [N18.6, Z99.2] Acute on chronic congestive heart failure, unspecified heart failure type ([place]) [I50.9]  [facility] [address]/TTS Left IJ PermCath  #. ESRD #Shortness of breath, hypertensive emergency (malignant HTN)  # Fever Last hemodialysis was on  12/05/2019 Catheter removed due to fever,allowing catheter free period  Catheter tip culture with MRSA infection ID consult in process today Will repeat blood cultures  Planning for dialysis access placement,  temporary versus permanent, tomorrow depending on the blood culture report Planning dialysis tomorrow once the access is established NPO post midnight tonight  #. Anemia of CKD  Lab Results  Component Value Date   HGB 8.4 (L) 12/09/2019   Epogen 4000 units with dialysis treatments TTS  #. Secondary hyperparathyroidism of renal origin N 25.81      Component Value Date/Time   PTH 138 (H) 05/17/2019 0336   Lab Results  Component Value Date   PHOS 7.8 (H) 12/09/2019   Will continue monitoring bone mineral metabolism parameters while hospitalized   #. Diabetes type 2 with CKD Hgb A1c MFr Bld (%)  Date Value  12/06/2019 6.2 (H)   Blood glucose readings within acceptable range  #Hyperkalemia Potassium today is 5.2 [place] ordered   LOS: 4 Daniel Thomas 12/6/20212:27 PM  [facility], [place]

## 2019-12-09 NOTE — Consult Note (Signed)
Pharmacy Antibiotic Note  Daniel Thomas is a 64 y.o. male admitted on 12/05/2019 with unclear source of infection but with long-term indwelling catheter in place ISO SIRS.  Pharmacy has been consulted for Vancomycin dosing after pt experienced fever of 101F post-HD 12/2 (1030-1330). 2L out recorded on this session. Patient received Vancomycin 2.25g (~22mg /kg) loading dose x1 (12/2 1515) and has no additional recorded outs.  Assessment: Vancomycin load 12/2. PermCath removed following concern for possible infection given fever after dialysis. Last HD session 12/2. Allowing catheter free period during the weekend.  12/4:  Vanc random @ 1437 = 28 mcg/mL (~48 hrs post loading dose) 12/6:  Vanc random @ 0459 = 22 mcg/mL (~72 hrs post loading dose)  Plan: 12/6:  Vanc random @ 0459 = 22 mcg/mL  No additional vanc needed. HD perm cath expected today & HD schedule to be determined.   Per ID: will plan 4wks IV vancomycin outpatient on HD days until 12/31   Height: 5\' 1"  (154.9 cm) Weight: 76.6 kg (168 lb 14 oz) IBW/kg (Calculated) : 52.3  Temp (24hrs), Avg:98.3 F (36.8 C), Min:97.7 F (36.5 C), Max:98.9 F (37.2 C)  Recent Labs  Lab 12/05/19 0402 12/06/19 0357 12/07/19 1437 12/08/19 0603 12/09/19 0459 12/09/19 0832  WBC 16.4* 32.8*  --  14.4*  --  9.3  CREATININE 8.31* 6.37*  --  10.95*  --  12.09*  VANCORANDOM  --   --  28  --  22  --     Estimated Creatinine Clearance: 5.4 mL/min (A) (by C-G formula based on SCr of 12.09 mg/dL (H)).    Allergies  Allergen Reactions  . Tape Dermatitis and Rash    Antimicrobials this admission: Ceftriaxone  1g (12/2-6)  Vancomycin 2.25g; 1g qHD 12/2>>    Microbiology results: 12/2 BCx: NGTD 12/2 Sputum: NGTD  12/3 Cath tip cx: pending  Thank you for allowing pharmacy to be a part of this patient's care.  Lorna Dibble, PharmD 12/09/2019 12:54 PM

## 2019-12-09 NOTE — Consult Note (Signed)
WOC consult requested for dermatis to porta cath.  Secure chat message sent to the primary team as follows; "The [place] team was consulted for dermatitis to the permacath site related to the dressing. Can you please consult the IV team for further recommendations, since this is not our area of expertice. Physician responded she will consult the IV team. Please re-consult if further assistance is needed.  Thank-you,  Julien Girt MSN, [place], [place], [place], [place]

## 2019-12-09 NOTE — Care Management Important Message (Signed)
Important Message  Patient Details  Name: Daniel Thomas MRN: [id number] Date of Birth: [birth year]   Medicare Important Message Given:  Yes     [name] 12/09/2019, 11:51 AM

## 2019-12-09 NOTE — Consult Note (Signed)
NAME: Daniel Thomas  DOB: Mar 19, 1955  MRN: [id number]  Date/Time: 12/09/2019 10:35 AM  REQUESTING PROVIDER: PAtel Subjective:  REASON FOR CONSULT: fever and catheter infection dialysis patient ? Daniel Thomas is a 64 y.o. male with a history of end-stage renal disease on dialysis through permacath, COPD, diabetes mellitus, hypertension , staph lugdunensis bacteremia in May 2021 for which he was treated with 4 weeks of IV antibiotic during dialysis followed by neg blood culture post antibiotic  presented from home on 12/05/19 by EMS with sudden onset SOB while sleeping and woke up with it at 1 am and  called EMS Bp was 231/123 and was given nitropaste by EMS. In the ED  BP 222/102, HR 107, pulse ox 100% on NRB, then switched to BIPAP. Labs showed WBC on 16.4, K 6.6, BNP 902, COVID neg. He denied any fever , cough, diarrhea, abdominal pain He received duoneb, solumedrol, IV nitroglycerine drip He had  a fever of 103 at 2.20 pm while getting dialysis and blood cultures sent. Started on vanco, ceftriaxone.  On 12/3 the left IJ permacath  was removed and the culture of the cath tip has moderate MRSA I am seeing the patient for the same Blood culture from 12/2 is neg, but no culture was sent when he spiked  Past Medical History:  Diagnosis Date  . COPD (chronic obstructive pulmonary disease) ([place])   . Diabetes mellitus without complication ([place])   . Enlarged prostate   . ESRD on dialysis ([place])   . Heart murmur   . High cholesterol   . Hypertension     Past Surgical History:  Procedure Laterality Date  . DIALYSIS/PERMA CATHETER INSERTION N/A 01/12/2018   Procedure: DIALYSIS/PERMA CATHETER INSERTION;  Surgeon: Katha Cabal, MD;  Location: [facility];  Service: Cardiovascular;  Laterality: N/A;  . DIALYSIS/PERMA CATHETER INSERTION N/A 05/16/2019   Procedure: DIALYSIS/PERMA CATHETER INSERTION;  Surgeon: Algernon Huxley, MD;  Location: [facility];  Service: Cardiovascular;   Laterality: N/A;  . DIALYSIS/PERMA CATHETER INSERTION N/A 10/17/2019   Procedure: DIALYSIS/PERMA CATHETER INSERTION;  Surgeon: Algernon Huxley, MD;  Location: [facility];  Service: Cardiovascular;  Laterality: N/A;  . DIALYSIS/PERMA CATHETER REMOVAL N/A 05/13/2019   Procedure: DIALYSIS/PERMA CATHETER REMOVAL;  Surgeon: Algernon Huxley, MD;  Location: [facility];  Service: Cardiovascular;  Laterality: N/A;  . DIALYSIS/PERMA CATHETER REMOVAL N/A 12/06/2019   Procedure: DIALYSIS/PERMA CATHETER REMOVAL;  Surgeon: Algernon Huxley, MD;  Location: [facility];  Service: Cardiovascular;  Laterality: N/A;  . TEE WITHOUT CARDIOVERSION N/A 05/15/2019   Procedure: TRANSESOPHAGEAL ECHOCARDIOGRAM (TEE);  Surgeon: Teodoro Spray, MD;  Location: [facility];  Service: Cardiovascular;  Laterality: N/A;    Social History   Socioeconomic History  . Marital status: Single    Spouse name: Not on file  . Number of children: Not on file  . Years of education: Not on file  . Highest education level: Not on file  Occupational History  . Not on file  Tobacco Use  . Smoking status: Former Smoker    Years: 20.00  . Smokeless tobacco: Never Used  Vaping Use  . Vaping Use: Never used  Substance and Sexual Activity  . Alcohol use: Never  . Drug use: Never  . Sexual activity: Not Currently  Other Topics Concern  . Not on file  Social History Narrative   Lives home alone, 3 aunts that live next door and brother 1 mile  away. Helps commute to appointments.   Social Determinants of Health   Financial Resource Strain:   . Difficulty of Paying Living Expenses: Not on file  Food Insecurity:   . Worried About Charity fundraiser in the Last Year: Not on file  . Ran Out of Food in the Last Year: Not on file  Transportation Needs:   . Lack of Transportation (Medical): Not on file  . Lack of Transportation (Non-Medical): Not on file  Physical Activity:   . Days of Exercise per Week: Not on file  .  Minutes of Exercise per Session: Not on file  Stress:   . Feeling of Stress : Not on file  Social Connections:   . Frequency of Communication with Friends and Family: Not on file  . Frequency of Social Gatherings with Friends and Family: Not on file  . Attends Religious Services: Not on file  . Active Member of Clubs or Organizations: Not on file  . Attends Archivist Meetings: Not on file  . Marital Status: Not on file  Intimate Partner Violence:   . Fear of Current or Ex-Partner: Not on file  . Emotionally Abused: Not on file  . Physically Abused: Not on file  . Sexually Abused: Not on file    No Known Allergies  FH HTN mother DM mother ? Current Facility-Administered Medications  Medication Dose Route Frequency Provider Last Rate Last Admin  . 0.9 %  sodium chloride infusion  250 mL Intravenous PRN Mansy, Jan A, MD      . acetaminophen (TYLENOL) tablet 650 mg  650 mg Oral Q6H PRN Mansy, Jan A, MD   650 mg at 12/09/19 3235   Or  . acetaminophen (TYLENOL) suppository 650 mg  650 mg Rectal Q6H PRN Mansy, Jan A, MD      . carvedilol (COREG) tablet 12.5 mg  12.5 mg Oral BID WC Mansy, Jan A, MD   12.5 mg at 12/09/19 0913  . cefTRIAXone (ROCEPHIN) 1 g in sodium chloride 0.9 % 100 mL IVPB  1 g Intravenous Q24H Mansy, Jan A, MD 200 mL/hr at 12/08/19 1539 1 g at 12/08/19 1539  . Chlorhexidine Gluconate Cloth 2 % PADS 6 each  6 each Topical Q0600 Murlean Iba, MD   6 each at 12/09/19 [phone]  . citalopram (CELEXA) tablet 20 mg  20 mg Oral Daily Mansy, Jan A, MD   20 mg at 12/09/19 0913  . epoetin alfa (EPOGEN) injection 4,000 Units  4,000 Units Intravenous Q T,Th,Sa-HD Murlean Iba, MD   4,000 Units at 12/05/19 1252  . ferric citrate (AURYXIA) tablet 210 mg  210 mg Oral TID AC Mansy, Jan A, MD   210 mg at 12/09/19 0913  . heparin injection 5,000 Units  5,000 Units Subcutaneous Q8H Mansy, Arvella Merles, MD   5,000 Units at 12/09/19 [phone]  . hydrALAZINE (APRESOLINE) injection 10 mg  10 mg  Intravenous Q6H PRN Mansy, Jan A, MD   10 mg at 12/06/19 1859  . lidocaine (LIDODERM) 5 % 1 patch  1 patch Transdermal Q12H Mansy, Arvella Merles, MD   1 patch at 12/09/19 0913  . linagliptin (TRADJENTA) tablet 5 mg  5 mg Oral Daily Mansy, Jan A, MD   5 mg at 12/09/19 0913  . metoprolol tartrate (LOPRESSOR) injection 5 mg  5 mg Intravenous Q1H PRN Loletha Grayer, MD      . ondansetron [facility]) tablet 4 mg  4 mg Oral Q6H PRN Mansy, Arvella Merles, MD  Or  . ondansetron (ZOFRAN) injection 4 mg  4 mg Intravenous Q6H PRN Mansy, Jan A, MD      . sodium chloride flush (NS) 0.9 % injection 3 mL  3 mL Intravenous Q12H Mansy, Jan A, MD   3 mL at 12/09/19 0913  . sodium chloride flush (NS) 0.9 % injection 3 mL  3 mL Intravenous PRN Mansy, Jan A, MD      . tiZANidine (ZANAFLEX) tablet 2 mg  2 mg Oral TID PRN Mansy, Jan A, MD      . traZODone (DESYREL) tablet 25 mg  25 mg Oral QHS PRN Mansy, Jan A, MD      . vancomycin variable dose per unstable renal function (pharmacist dosing)   Does not apply See admin instructions Loletha Grayer, MD      . Derrill Memo ON 12/10/2019] Vitamin D (Ergocalciferol) (DRISDOL) capsule 50,000 Units  50,000 Units Oral UD Mansy, Arvella Merles, MD         Abtx:  Anti-infectives (From admission, onward)   Start     Dose/Rate Route Frequency Ordered Stop   12/07/19 1407  vancomycin variable dose per unstable renal function (pharmacist dosing)         Does not apply See admin instructions 12/07/19 1408     12/05/19 1615  vancomycin (VANCOREADY) IVPB 1250 mg/250 mL       "Followed by" Linked Group Details   1,250 mg 166.7 mL/hr over 90 Minutes Intravenous  Once 12/05/19 1509 12/05/19 2030   12/05/19 1600  cefTRIAXone (ROCEPHIN) 1 g in sodium chloride 0.9 % 100 mL IVPB        1 g 200 mL/hr over 30 Minutes Intravenous Every 24 hours 12/05/19 0604 12/10/19 1559   12/05/19 1515  vancomycin (VANCOCIN) IVPB 1000 mg/200 mL premix       "Followed by" Linked Group Details   1,000 mg 200 mL/hr over 60  Minutes Intravenous  Once 12/05/19 1509 12/05/19 1818      REVIEW OF SYSTEMS:  Const: fever, negative chills, negative weight loss Eyes: negative diplopia or visual changes, negative eye pain ENT: negative coryza, negative sore throat Resp: as above Cards: negative for chest pain, palpitations, lower extremity edema GU: negative for frequency, dysuria and hematuria GI: Negative for abdominal pain, diarrhea, bleeding, constipation Skin: has  rash and pruritus Heme: negative for easy bruising and gum/nose bleeding MS: negative for myalgias, arthralgias, back pain and muscle weakness Neurolo:negative for headaches, dizziness, vertigo, memory problems  Psych: negative for feelings of anxiety, depression  Endocrine: negative for thyroid, diabetes Allergy/Immunology- dressing with taped border- allergic reaction  Objective:  VITALS:  BP (!) 169/75 (BP Location: Left Arm)   Pulse 77   Temp 98.3 F (36.8 C)   Resp 17   Ht 5\' 1"  (1.549 m)   Wt 76.6 kg   SpO2 99%   BMI 31.91 kg/m  PHYSICAL EXAM:  General: Alert, cooperative, no distress, appears stated age. Sitting in chair Head: Normocephalic, without obvious abnormality, atraumatic. Eyes: Conjunctivae clear, anicteric sclerae. Pupils are equal ENT Nares normal. No drainage or sinus tenderness. Lips, mucosa, and tongue normal. No Thrush Neck: Supple, symmetrical, no adenopathy, thyroid: non tender no carotid bruit and no JVD. Back: No CVA tenderness. Lungs: b/l air entry Heart: s1s2 Abdomen: Soft, non-tender,not distended. Bowel sounds normal. No masses Extremities: atraumatic, no cyanosis. No edema. No clubbing Skin: chest wall at the cath site- eczematious rash with excoriations on the left with hyperpigemtaion   Had a  similar rash Imay 2021 on the rt side May 2021   June 2021    Lymph: Cervical, supraclavicular normal. Neurologic: Grossly non-focal Pertinent Labs Lab Results CBC    Component Value Date/Time    WBC 9.3 12/09/2019 0832   RBC 2.75 (L) 12/09/2019 0832   HGB 8.4 (L) 12/09/2019 0832   HCT 24.8 (L) 12/09/2019 0832   PLT 183 12/09/2019 0832   MCV 90.2 12/09/2019 0832   MCH 30.5 12/09/2019 0832   MCHC 33.9 12/09/2019 0832   RDW 14.6 12/09/2019 0832   LYMPHSABS 1.3 12/05/2019 0402   MONOABS 1.4 (H) 12/05/2019 0402   EOSABS 0.3 12/05/2019 0402   BASOSABS 0.1 12/05/2019 0402    CMP Latest Ref Rng & Units 12/09/2019 12/08/2019 12/06/2019  Glucose 70 - 99 mg/dL 108(H) 115(H) 156(H)  BUN 8 - 23 mg/dL 113(H) 99(H) 43(H)  Creatinine 0.61 - 1.24 mg/dL 12.09(H) 10.95(H) 6.37(H)  Sodium 135 - 145 mmol/L 128(L) 129(L) 135  Potassium 3.5 - 5.1 mmol/L 5.2(H) 5.0 4.2  Chloride 98 - 111 mmol/L 93(L) 94(L) 97(L)  CO2 22 - 32 mmol/L 17(L) 18(L) 22  Calcium 8.9 - 10.3 mg/dL 8.0(L) 8.0(L) 8.6(L)  Total Protein 6.5 - 8.1 g/dL - - -  Total Bilirubin 0.3 - 1.2 mg/dL - - -  Alkaline Phos 38 - 126 U/L - - -  AST 15 - 41 U/L - - -  ALT 0 - 44 U/L - - -      Microbiology: Recent Results (from the past 240 hour(s))  Resp Panel by RT-PCR (Flu A&B, Covid) Nasopharyngeal Swab     Status: None   Collection Time: 12/05/19  4:02 AM   Specimen: Nasopharyngeal Swab; Nasopharyngeal(NP) swabs in vial transport medium  Result Value Ref Range Status   SARS Coronavirus 2 by RT PCR NEGATIVE NEGATIVE Final    Comment: (NOTE) SARS-CoV-2 target nucleic acids are NOT DETECTED.  The SARS-CoV-2 RNA is generally detectable in upper respiratory specimens during the acute phase of infection. The lowest concentration of SARS-CoV-2 viral copies this assay can detect is 138 copies/mL. A negative result does not preclude SARS-Cov-2 infection and should not be used as the sole basis for treatment or other patient management decisions. A negative result may occur with  improper specimen collection/handling, submission of specimen other than nasopharyngeal swab, presence of viral mutation(s) within the areas targeted by  this assay, and inadequate number of viral copies(<138 copies/mL). A negative result must be combined with clinical observations, patient history, and epidemiological information. The expected result is Negative.  Fact Sheet for Patients:  EntrepreneurPulse.com.au  Fact Sheet for Healthcare Providers:  IncredibleEmployment.be  This test is no t yet approved or cleared by the [place] FDA and  has been authorized for detection and/or diagnosis of SARS-CoV-2 by FDA under an Emergency Use Authorization (EUA). This EUA will remain  in effect (meaning this test can be used) for the duration of the COVID-19 declaration under Section 564(b)(1) of the Act, 21 U.S.C.section 360bbb-3(b)(1), unless the authorization is terminated  or revoked sooner.       Influenza A by PCR NEGATIVE NEGATIVE Final   Influenza B by PCR NEGATIVE NEGATIVE Final    Comment: (NOTE) The Xpert Xpress SARS-CoV-2/FLU/RSV plus assay is intended as an aid in the diagnosis of influenza from Nasopharyngeal swab specimens and should not be used as a sole basis for treatment. Nasal washings and aspirates are unacceptable for Xpert Xpress SARS-CoV-2/FLU/RSV testing.  Fact Sheet for Patients: EntrepreneurPulse.com.au  Fact Sheet for Healthcare Providers: IncredibleEmployment.be  This test is not yet approved or cleared by the [place] FDA and has been authorized for detection and/or diagnosis of SARS-CoV-2 by FDA under an Emergency Use Authorization (EUA). This EUA will remain in effect (meaning this test can be used) for the duration of the COVID-19 declaration under Section 564(b)(1) of the Act, 21 U.S.C. section 360bbb-3(b)(1), unless the authorization is terminated or revoked.  Performed at [facility], [address] [place], [place] [address]   Culture, blood (Routine X 2) w Reflex to ID Panel     Status: None  (Preliminary result)   Collection Time: 12/05/19  6:26 AM   Specimen: BLOOD  Result Value Ref Range Status   Specimen Description BLOOD LEFT ANTECUBITAL  Final   Special Requests   Final    BOTTLES DRAWN AEROBIC AND ANAEROBIC Blood Culture results may not be optimal due to an excessive volume of blood received in culture bottles   Culture   Final    NO GROWTH 4 DAYS Performed at [facility], [address] [place], [place] [address]    Report Status PENDING  Incomplete  Culture, blood (Routine X 2) w Reflex to ID Panel     Status: None (Preliminary result)   Collection Time: 12/05/19  6:26 AM   Specimen: BLOOD  Result Value Ref Range Status   Specimen Description BLOOD RIGHT ANTECUBITAL  Final   Special Requests   Final    BOTTLES DRAWN AEROBIC AND ANAEROBIC Blood Culture results may not be optimal due to an excessive volume of blood received in culture bottles   Culture   Final    NO GROWTH 4 DAYS Performed at [facility], [address] [place], [place] [address]    Report Status PENDING  Incomplete  Cath Tip Culture     Status: None (Preliminary result)   Collection Time: 12/06/19 12:39 PM   Specimen: Catheter Tip; Other  Result Value Ref Range Status   Specimen Description   Final    CATH TIP Performed at [facility], [address], [place], [place] [address]    Special Requests   Final    NONE Performed at [facility], [address] [place], [place] [address]    Culture   Final    MODERATE STAPHYLOCOCCUS AUREUS SUSCEPTIBILITIES TO FOLLOW Performed at [facility], [place] [address] [place], [place] [address]    Report Status PENDING  Incomplete    IMAGING RESULTS:  I have personally reviewed the films Lungs are clear. No pneumothorax or pleural effusion. Left internal jugular hemodialysis catheter tip is seen at the superior right atrium. Cardiac size within normal limits. Pulmonary vascularity is normal. No  acute bone abnormality.?   Impression/Recommendation ?Acute resp failure with hypoxia on presentation due to malignant HTN /CHF ? COPD exacerbation Received lasix, solumedrol, duoneb- resolved  Fever after hospitalization on 12/2 during dialysis- no blood culture was sent during that fever ( but one done on admission was neg ) . Dialysis cath was removed and the cath tip culture is MRSA He was started on vanco after fever No repeat culture has been sent  MRSA endovascular infection Allergic dermatitis to the dressing leading to excoriations leading to  Infection Recurrent bacteremia ( had one in May 2021) Need hypoallerginic dressing Recommend wound care nurse input  Repeat  blood culture ( sent this morning) and if neg 24 hrs, can get a  new  catheter No TEE is needed HE will need minimum 4 weeks of IV vanco 01/03/20 which can be  given during dialysis- repeat Blood culture 10 days after completion of antibiotic Discontinue ceftriaxone  Uncontrolled HTn- got hydralazine today On carvedilol, metoprolol  ESRD on dialysis  H/O staph lugdunensis bacteremia with negative TEE and neg MRI c/spine I May 2021 treated ( catheter was infected and also had eczematous rash at the cath site)  Prostate ca- surveillance followed  by urologist  ? ? ___________________________________________________ Discussed with patient, his sone and  requesting provider and nephrologist Note:  This document was prepared using Dragon voice recognition software and may include unintentional dictation errors.

## 2019-12-10 ENCOUNTER — Inpatient Hospital Stay
Admission: EM | Disposition: A | Discharge status: Home / Self Care | Payer: Self-pay | Source: Home / Self Care | Attending: Internal Medicine

## 2019-12-10 DIAGNOSIS — N185 Chronic kidney disease, stage 5: Secondary | ICD-10-CM

## 2019-12-10 DIAGNOSIS — I509 Heart failure, unspecified: Secondary | ICD-10-CM | POA: Diagnosis not present

## 2019-12-10 DIAGNOSIS — I161 Hypertensive emergency: Secondary | ICD-10-CM | POA: Diagnosis not present

## 2019-12-10 DIAGNOSIS — A419 Sepsis, unspecified organism: Secondary | ICD-10-CM | POA: Diagnosis not present

## 2019-12-10 DIAGNOSIS — J9600 Acute respiratory failure, unspecified whether with hypoxia or hypercapnia: Secondary | ICD-10-CM | POA: Diagnosis not present

## 2019-12-10 HISTORY — PX: DIALYSIS/PERMA CATHETER INSERTION: CATH118288

## 2019-12-10 LAB — GLUCOSE, CAPILLARY
Glucose-Capillary: 83 mg/dL (ref 70–99)
Glucose-Capillary: 99 mg/dL (ref 70–99)

## 2019-12-10 LAB — CULTURE, BLOOD (ROUTINE X 2)
Culture: NO GROWTH
Culture: NO GROWTH

## 2019-12-10 LAB — CATH TIP CULTURE: Culture: >=100000 — AB

## 2019-12-10 SURGERY — DIALYSIS/PERMA CATHETER INSERTION
Anesthesia: Moderate Sedation

## 2019-12-10 MED ORDER — FENTANYL CITRATE (PF) 100 MCG/2ML IJ SOLN
INTRAMUSCULAR | Status: AC
  Filled 2019-12-10: qty 2

## 2019-12-10 MED ORDER — HYDRALAZINE HCL 20 MG/ML IJ SOLN
INTRAMUSCULAR | Status: DC | PRN
  Administered 2019-12-10: 10 mg via INTRAVENOUS

## 2019-12-10 MED ORDER — CEFAZOLIN SODIUM-DEXTROSE 1-4 GM/50ML-% IV SOLN
1.0000 g | Freq: Once | INTRAVENOUS | Status: DC
Start: 2019-12-10 — End: 2019-12-10

## 2019-12-10 MED ORDER — HEPARIN SODIUM (PORCINE) 5000 UNIT/ML IJ SOLN
INTRAMUSCULAR | Status: AC
  Filled 2019-12-10: qty 2

## 2019-12-10 MED ORDER — CLINDAMYCIN PHOSPHATE 300 MG/50ML IV SOLN
300.0000 mg | Freq: Once | INTRAVENOUS | Status: DC
  Filled 2019-12-10: qty 50

## 2019-12-10 MED ORDER — CEFAZOLIN SODIUM-DEXTROSE 1-4 GM/50ML-% IV SOLN
1.0000 g | INTRAVENOUS | Status: DC
Start: 2019-12-11 — End: 2019-12-10

## 2019-12-10 MED ORDER — VANCOMYCIN HCL 750 MG/150ML IV SOLN
750.0000 mg | INTRAVENOUS | Status: DC
  Administered 2019-12-10: 750 mg via INTRAVENOUS
  Filled 2019-12-10 (×2): qty 150

## 2019-12-10 MED ORDER — CEFAZOLIN SODIUM-DEXTROSE 1-4 GM/50ML-% IV SOLN
INTRAVENOUS | Status: AC
  Filled 2019-12-10: qty 50

## 2019-12-10 MED ORDER — HYDRALAZINE HCL 20 MG/ML IJ SOLN
INTRAMUSCULAR | Status: AC
  Filled 2019-12-10: qty 1

## 2019-12-10 MED ORDER — FLUTICASONE PROPIONATE 50 MCG/ACT NA SUSP
2.0000 | Freq: Every day | NASAL | Status: DC
  Administered 2019-12-10 – 2019-12-11 (×2): 2 via NASAL
  Filled 2019-12-10: qty 16

## 2019-12-10 MED ORDER — MIDAZOLAM HCL 2 MG/2ML IJ SOLN
INTRAMUSCULAR | Status: DC | PRN
  Administered 2019-12-10: 1 mg via INTRAVENOUS

## 2019-12-10 MED ORDER — CLINDAMYCIN PHOSPHATE 300 MG/50ML IV SOLN
INTRAVENOUS | Status: AC
  Administered 2019-12-10: 300 mg
  Filled 2019-12-10: qty 50

## 2019-12-10 MED ORDER — TRAMADOL HCL 50 MG PO TABS
25.0000 mg | ORAL_TABLET | Freq: Once | ORAL | Status: AC
  Administered 2019-12-10: 25 mg via ORAL
  Filled 2019-12-10: qty 1

## 2019-12-10 MED ORDER — MIDAZOLAM HCL 5 MG/5ML IJ SOLN
INTRAMUSCULAR | Status: AC
  Filled 2019-12-10: qty 5

## 2019-12-10 MED ORDER — FENTANYL CITRATE (PF) 100 MCG/2ML IJ SOLN
INTRAMUSCULAR | Status: DC | PRN
  Administered 2019-12-10: 50 ug via INTRAVENOUS

## 2019-12-10 SURGICAL SUPPLY — 14 items
CATH CANNON HEMO 15FR 19 (HEMODIALYSIS SUPPLIES) ×2 IMPLANT
DERMABOND ADVANCED (GAUZE/BANDAGES/DRESSINGS) ×2
DERMABOND ADVANCED .7 DNX12 (GAUZE/BANDAGES/DRESSINGS) IMPLANT
DRAPE INCISE IOBAN 66X45 STRL (DRAPES) ×2 IMPLANT
GUIDEWIRE SUPER STIFF .035X180 (WIRE) ×4 IMPLANT
NDL ENTRY 21GA 7CM ECHOTIP (NEEDLE) IMPLANT
NEEDLE ENTRY 21GA 7CM ECHOTIP (NEEDLE) ×3 IMPLANT
PACK ANGIOGRAPHY (CUSTOM PROCEDURE TRAY) ×2 IMPLANT
SET INTRO CAPELLA COAXIAL (SET/KITS/TRAYS/PACK) ×2 IMPLANT
SUT MNCRL 4-0 (SUTURE) ×2
SUT MNCRL 4-0 27XMFL (SUTURE) ×1
SUT SILK 0 SH 30 (SUTURE) ×2 IMPLANT
SUTURE MNCRL 4-0 27XMF (SUTURE) IMPLANT
TOWEL OR 17X26 4PK STRL BLUE (TOWEL DISPOSABLE) ×2 IMPLANT

## 2019-12-10 NOTE — Progress Notes (Signed)
[facility], [place] 12/10/19  Subjective:   LOS: 5 Patient known to our practice from previous admissions.Presents to ER for shortness of breath that started in the middle of the night.He did not go to his dialysis treatment but came to the ER for evaluation.Upon presentation, blood pressure was 222/102.Patient was given Nitropaste and placed on BiPAP. Patient was urgently dialyzed on December 2, 2 L of fluid was removed.Post dialysis, patient experienced confusion and had a temperature of 103. PermCath infection was suspected.Vascular surgery was consulted and PermCath was removed on 12/06/2019.  Update  Patient resting in bed, planning for permacath placement later today and dialysis once access is established.  Discussed the plan with the patient.  Objective:  Vital signs in last 24 hours:  Temp:  [97.5 F (36.4 C)-98.7 F (37.1 C)] 97.6 F (36.4 C) (12/07 1133) Pulse Rate:  [68-80] 71 (12/07 1133) Resp:  [16-19] 16 (12/07 1133) BP: (166-192)/(79-95) 166/80 (12/07 1133) SpO2:  [99 %-100 %] 100 % (12/07 1133) Weight:  [76.6 kg] 76.6 kg (12/07 0615)  Weight change:  Filed Weights   12/05/19 0405 12/07/19 0647 12/10/19 0615  Weight: 104.7 kg 76.6 kg 76.6 kg    Intake/Output:    Intake/Output Summary (Last 24 hours) at 12/10/2019 1259 Last data filed at 12/09/2019 1900 Gross per 24 hour  Intake 0 ml  Output --  Net 0 ml    Physical Exam: General:  Appears pleasant and comfortable in bed  HEENT  normocephalic, atraumatic  Pulm/lungs  normal and symmetrical respirations, lungs with fine crackles at the bases  CVS/Heart  regular rate and rhythm  Abdomen:   Soft, nontender, nondistended  Extremities:  No peripheral edema  Neurologic:  Speech clear and appropriate  Skin:  Rashes +  around catheter site       Basic Metabolic Panel:  Recent Labs  Lab 12/05/19 0402 12/05/19 0402 12/06/19 0357 12/08/19 0603 12/09/19 0832  NA 135  --  135  129* 128*  K 5.2*  --  4.2 5.0 5.2*  CL 101  --  97* 94* 93*  CO2 21*  --  22 18* 17*  GLUCOSE 117*  --  156* 115* 108*  BUN 59*  --  43* 99* 113*  CREATININE 8.31*  --  6.37* 10.95* 12.09*  CALCIUM 8.5*   < > 8.6* 8.0* 8.0*  MG 2.2  --   --   --   --   PHOS 6.6*  --   --   --  7.8*   < > = values in this interval not displayed.     CBC: Recent Labs  Lab 12/05/19 0402 12/06/19 0357 12/08/19 0603 12/09/19 0832  WBC 16.4* 32.8* 14.4* 9.3  NEUTROABS 13.3*  --   --   --   HGB 9.8* 8.3* 8.2* 8.4*  HCT 29.5* 25.4* 24.6* 24.8*  MCV 93.4 93.7 91.8 90.2  PLT 368 306 237 183      Lab Results  Component Value Date   HEPBSAG NON REACTIVE 05/17/2019   HEPBSAB Non Reactive 01/12/2018   HEPBIGM Negative 01/12/2018      Microbiology:  Recent Results (from the past 240 hour(s))  Resp Panel by RT-PCR (Flu A&B, Covid) Nasopharyngeal Swab     Status: None   Collection Time: 12/05/19  4:02 AM   Specimen: Nasopharyngeal Swab; Nasopharyngeal(NP) swabs in vial transport medium  Result Value Ref Range Status   SARS Coronavirus 2 by RT PCR NEGATIVE NEGATIVE Final  Comment: (NOTE) SARS-CoV-2 target nucleic acids are NOT DETECTED.  The SARS-CoV-2 RNA is generally detectable in upper respiratory specimens during the acute phase of infection. The lowest concentration of SARS-CoV-2 viral copies this assay can detect is 138 copies/mL. A negative result does not preclude SARS-Cov-2 infection and should not be used as the sole basis for treatment or other patient management decisions. A negative result may occur with  improper specimen collection/handling, submission of specimen other than nasopharyngeal swab, presence of viral mutation(s) within the areas targeted by this assay, and inadequate number of viral copies(<138 copies/mL). A negative result must be combined with clinical observations, patient history, and epidemiological information. The expected result is Negative.  Fact  Sheet for Patients:  EntrepreneurPulse.com.au  Fact Sheet for Healthcare Providers:  IncredibleEmployment.be  This test is no t yet approved or cleared by the [place] FDA and  has been authorized for detection and/or diagnosis of SARS-CoV-2 by FDA under an Emergency Use Authorization (EUA). This EUA will remain  in effect (meaning this test can be used) for the duration of the COVID-19 declaration under Section 564(b)(1) of the Act, 21 U.S.C.section 360bbb-3(b)(1), unless the authorization is terminated  or revoked sooner.       Influenza A by PCR NEGATIVE NEGATIVE Final   Influenza B by PCR NEGATIVE NEGATIVE Final    Comment: (NOTE) The Xpert Xpress SARS-CoV-2/FLU/RSV plus assay is intended as an aid in the diagnosis of influenza from Nasopharyngeal swab specimens and should not be used as a sole basis for treatment. Nasal washings and aspirates are unacceptable for Xpert Xpress SARS-CoV-2/FLU/RSV testing.  Fact Sheet for Patients: EntrepreneurPulse.com.au  Fact Sheet for Healthcare Providers: IncredibleEmployment.be  This test is not yet approved or cleared by the [place] FDA and has been authorized for detection and/or diagnosis of SARS-CoV-2 by FDA under an Emergency Use Authorization (EUA). This EUA will remain in effect (meaning this test can be used) for the duration of the COVID-19 declaration under Section 564(b)(1) of the Act, 21 U.S.C. section 360bbb-3(b)(1), unless the authorization is terminated or revoked.  Performed at [facility], [address] [place], [place] [address]   Culture, blood (Routine X 2) w Reflex to ID Panel     Status: None   Collection Time: 12/05/19  6:26 AM   Specimen: BLOOD  Result Value Ref Range Status   Specimen Description BLOOD LEFT ANTECUBITAL  Final   Special Requests   Final    BOTTLES DRAWN AEROBIC AND ANAEROBIC Blood Culture  results may not be optimal due to an excessive volume of blood received in culture bottles   Culture   Final    NO GROWTH 5 DAYS Performed at [facility], [address] [place], [place] [address]    Report Status 12/10/2019 FINAL  Final  Culture, blood (Routine X 2) w Reflex to ID Panel     Status: None   Collection Time: 12/05/19  6:26 AM   Specimen: BLOOD  Result Value Ref Range Status   Specimen Description BLOOD RIGHT ANTECUBITAL  Final   Special Requests   Final    BOTTLES DRAWN AEROBIC AND ANAEROBIC Blood Culture results may not be optimal due to an excessive volume of blood received in culture bottles   Culture   Final    NO GROWTH 5 DAYS Performed at [facility], [address], [place], [place] [address]    Report Status 12/10/2019 FINAL  Final  Cath Tip Culture  Status: Abnormal (Preliminary result)   Collection Time: 12/06/19 12:39 PM   Specimen: Catheter Tip; Other  Result Value Ref Range Status   Specimen Description   Final    CATH TIP Performed at [facility], [address] [place], [place] [address]    Special Requests   Final    NONE Performed at [facility], [address] [place], [place] [address]    Culture (A)  Final    >=100,000 COLONIES/mL METHICILLIN RESISTANT STAPHYLOCOCCUS AUREUS CULTURE REINCUBATED FOR BETTER GROWTH Performed at [facility], [place] [address], [place],  [place] [address]    Report Status PENDING  Incomplete   Organism ID, Bacteria METHICILLIN RESISTANT STAPHYLOCOCCUS AUREUS (A)  Final      Susceptibility   Methicillin resistant staphylococcus aureus - MIC*    CIPROFLOXACIN >=8 RESISTANT Resistant     ERYTHROMYCIN >=8 RESISTANT Resistant     GENTAMICIN <=0.5 SENSITIVE Sensitive     OXACILLIN >=4 RESISTANT Resistant     TETRACYCLINE <=1 SENSITIVE Sensitive     VANCOMYCIN 1 SENSITIVE Sensitive     TRIMETH/SULFA <=10 SENSITIVE Sensitive     CLINDAMYCIN >=8 RESISTANT Resistant      RIFAMPIN <=0.5 SENSITIVE Sensitive     Inducible Clindamycin NEGATIVE Sensitive     * >=100,000 COLONIES/mL METHICILLIN RESISTANT STAPHYLOCOCCUS AUREUS  Culture, blood (Routine X 2) w Reflex to ID Panel     Status: None (Preliminary result)   Collection Time: 12/09/19  8:32 AM   Specimen: BLOOD  Result Value Ref Range Status   Specimen Description BLOOD BLOOD RIGHT FOREARM  Final   Special Requests   Final    BOTTLES DRAWN AEROBIC AND ANAEROBIC Blood Culture adequate volume   Culture   Final    NO GROWTH < 24 HOURS Performed at [facility], [address], [place], [place] [address]    Report Status PENDING  Incomplete  Culture, blood (Routine X 2) w Reflex to ID Panel     Status: None (Preliminary result)   Collection Time: 12/09/19  8:42 AM   Specimen: BLOOD  Result Value Ref Range Status   Specimen Description BLOOD RIGHT ANTECUBITAL  Final   Special Requests   Final    BOTTLES DRAWN AEROBIC AND ANAEROBIC Blood Culture adequate volume   Culture   Final    NO GROWTH < 24 HOURS Performed at [facility], [address] [place], [place] [address]    Report Status PENDING  Incomplete    Coagulation Studies: No results for input(s): LABPROT, INR in the last 72 hours.  Urinalysis: No results for input(s): COLORURINE, LABSPEC, PHURINE, GLUCOSEU, HGBUR, BILIRUBINUR, KETONESUR, PROTEINUR, UROBILINOGEN, NITRITE, LEUKOCYTESUR in the last 72 hours.  Invalid input(s): APPERANCEUR    Imaging: No results found.   Medications:   . sodium chloride     . carvedilol  12.5 mg Oral BID WC  . Chlorhexidine Gluconate Cloth  6 each Topical Q0600  . Chlorhexidine Gluconate Cloth  6 each Topical Q0600  . citalopram  20 mg Oral Daily  . epoetin (EPOGEN/PROCRIT) injection  4,000 Units Intravenous Q T,Th,Sa-HD  . ferric citrate  210 mg Oral TID AC  . fluticasone  2 spray Each Nare Daily  . heparin  5,000 Units Subcutaneous Q8H  . hydrALAZINE  25 mg Oral Q8H  .  lidocaine  1 patch Transdermal Q12H  . linagliptin  5 mg Oral Daily  . mupirocin ointment   Nasal BID  . sodium  chloride flush  3 mL Intravenous Q12H  . vancomycin variable dose per unstable renal function (pharmacist dosing)   Does not apply See admin instructions  . Vitamin D (Ergocalciferol)  50,000 Units Oral UD   sodium chloride, acetaminophen **OR** acetaminophen, hydrALAZINE, ondansetron **OR** ondansetron (ZOFRAN) IV, sodium chloride flush, tiZANidine, traZODone  Assessment/ Plan:  64 y.o. male with COPD, diabetes mellitus type 2, BPH, prostate cancer followed bu urology hypertension, hyperlipidemia, ESRD on HD TTS, anemia of chronic kidney disease, secondary hyperparathyroidism  was admitted on 12/05/2019 for  Active Problems:   Hypertensive emergency   Severe sepsis ([place])   COPD exacerbation ([place])   Acute respiratory failure (HCC)   SIRS (systemic inflammatory response syndrome) (HCC)   Acute on chronic congestive heart failure (HCC)  Hyperkalemia [E87.5] COPD exacerbation (HCC) [J44.1] SIRS (systemic inflammatory response syndrome) ([place]) [R65.10] Hypertensive emergency [I16.1] Acute respiratory failure, unspecified whether with hypoxia or hypercapnia ([place]) [J96.00] Chronic kidney disease with end stage renal failure on dialysis ([place]) [N18.6, Z99.2] Acute on chronic congestive heart failure, unspecified heart failure type ([place]) [I50.9]  [facility] [address]/TTS Left IJ PermCath  #. ESRD #Shortness of breath, hypertensive emergency (malignant HTN)  # Fever Patient received dialysis treatment on 12/05/2019 We were allowing a catheter free period as he developed fever after the dialysis treatment. Catheter tip culture shows MRSA infection Blood culture repeated on 12/09/2019,with no growth in 24 hours ID team involved in the care Planning for permacath placement today We will plan for dialysis once access is established  #. Anemia of CKD  Lab Results   Component Value Date   HGB 8.4 (L) 12/09/2019   We will continue monitoring CBCs Resume Epogen low-dose with HD  #. Secondary hyperparathyroidism of renal origin N 25.81      Component Value Date/Time   PTH 138 (H) 05/17/2019 0336   Lab Results  Component Value Date   PHOS 7.8 (H) 12/09/2019   Continue ferric citrate 3 times daily    #. Diabetes type 2 with CKD Hgb A1c MFr Bld (%)  Date Value  12/06/2019 6.2 (H)    #Hypertension Blood pressure 166/80 today Patient is on carvedilol and hydralazine   LOS: 5 Daniel Thomas 12/7/202112:59 PM  [facility] [place], [place]

## 2019-12-10 NOTE — Progress Notes (Signed)
Patient is complaining  of severe left ear ache. Tylnol  650 mg PRN not due yet. Notified Randol Kern NP and received new medications for it as indicated in the [facility]. Will continue to monitor.

## 2019-12-10 NOTE — Consult Note (Signed)
Pharmacy Antibiotic Note  Daniel Thomas is a 64 y.o. male admitted on 12/05/2019 with unclear source of infection but with long-term indwelling catheter in place ISO SIRS.  Pharmacy has been consulted for Vancomycin dosing after pt experienced fever of 101F post-HD 12/2 (1030-1330). 2L out recorded on this session. Patient received Vancomycin 2.25g (~22mg /kg) loading dose x1 (12/2 1515) and has no additional recorded outs.  Assessment: Vancomycin load 12/2. PermCath removed following concern for possible infection given fever after dialysis. Last HD session 12/2. Allowing catheter free period during the weekend.  12/4:  Vanc random @ 1437 = 28 mcg/mL (~48 hrs post loading dose) 12/6:  Vanc random @ 0459 = 22 mcg/mL (~72 hrs post loading dose)  Plan: 12/6:  Vanc random @ 0459 = 22 mcg/mL  No additional vanc needed. HD perm cath expected today & HD schedule to be determined.   Per ID: will plan 4wks IV vancomycin outpatient on HD days until 12/31  12/7:  Dialysis RN says he will be on T-Th-Sat HD schedule.  Today is 1st HD session he's had since admission.   Will order Vanc 750 mg T-Th-Sat post HD and draw Vanc trough before 3rd HD session on 12/11.   Height: 5\' 1"  (154.9 cm) Weight: 77.1 kg (170 lb) IBW/kg (Calculated) : 52.3  Temp (24hrs), Avg:98 F (36.7 C), Min:97.5 F (36.4 C), Max:98.8 F (37.1 C)  Recent Labs  Lab 12/05/19 0402 12/06/19 0357 12/07/19 1437 12/08/19 0603 12/09/19 0459 12/09/19 0832  WBC 16.4* 32.8*  --  14.4*  --  9.3  CREATININE 8.31* 6.37*  --  10.95*  --  12.09*  VANCORANDOM  --   --  28  --  22  --     Estimated Creatinine Clearance: 5.4 mL/min (A) (by C-G formula based on SCr of 12.09 mg/dL (H)).    Allergies  Allergen Reactions  . Tape Dermatitis and Rash    Antimicrobials this admission: Ceftriaxone  1g (12/2-6)  Vancomycin 2.25g; 1g qHD 12/2>>    Microbiology results: 12/2 BCx: NGTD 12/2 Sputum: NGTD  12/3 Cath tip cx:  pending  Thank you for allowing pharmacy to be a part of this patient's care.  Angie Piercey D, PharmD 12/10/2019 7:12 PM

## 2019-12-10 NOTE — Interval H&P Note (Signed)
History and Physical Interval Note:  12/10/2019 1:17 PM  Daniel Thomas  has presented today for surgery, with the diagnosis of Sepsis.  The various methods of treatment have been discussed with the patient and family. After consideration of risks, benefits and other options for treatment, the patient has consented to  Procedure(s): DIALYSIS/PERMA CATHETER REMOVAL (N/A) as a surgical intervention.  The patient's history has been reviewed, patient examined, no change in status, stable for surgery.  I have reviewed the patient's chart and labs.  Questions were answered to the patient's satisfaction.     Hortencia Pilar

## 2019-12-10 NOTE — Op Note (Signed)
OPERATIVE NOTE   PROCEDURE: 1. Insertion of tunneled dialysis catheter right external jugular approach with ultrasound and fluoroscopic guidance.  PRE-OPERATIVE DIAGNOSIS: End-stage renal disease requiring hemodialysis; recent episode of catheter related sepsis now resolved  POST-OPERATIVE DIAGNOSIS: Same  SURGEON: Hortencia Pilar.  ANESTHESIA: Conscious sedation was administered under my direct supervision by the interventional radiology RN. IV Versed plus fentanyl were utilized. Continuous ECG, pulse oximetry and blood pressure was monitored throughout the entire procedure. Conscious sedation was for a total of 39 minutes and 59 seconds.  ESTIMATED BLOOD LOSS: Minimal cc  CONTRAST USED:  None  FLUOROSCOPY TIME: 1.5 minutes  INDICATIONS:   Daniel Thomas a 64 y.o. y.o. male who presents with the need for hemodialysis.  He is status post removal of an infected catheter last week and is now been cleared of infection.  Is undergoing placement of a tunneled catheter.  Risk and benefits have been reviewed patient agrees to proceed.  DESCRIPTION: After obtaining full informed written consent, the patient was positioned supine. The right neck and chest wall was prepped and draped in a sterile fashion. Ultrasound was placed in a sterile sleeve. Ultrasound was utilized to identify the right external jugular vein which is noted to be echolucent and compressible indicating patency. Image is recorded for the permanent record. Under direct ultrasound visualization a micro-needle is inserted into the vein followed by the micro-wire. Micro-sheath was then advanced and a J wire is inserted without difficulty under fluoroscopic guidance. Small counterincision was made at the wire insertion site. Dilators are passed over the wire and the tunneled dialysis catheter is fed into the central venous system without difficulty.  Under fluoroscopy the catheter tip positioned at the atrial caval junction. The  catheter is then approximated to the chest wall and an exit site selected. 1% lidocaine is infiltrated in soft tissues at this level small incision is made and the tunneling device is then passed from the exit site to the neck counterincision. Catheter is then connected to the tunneling device and the catheter was pulled subcutaneously. It is then transected and the hub assembly connected without difficulty. Both lumens aspirate and flush easily. After verification of smooth contour with proper tip position under fluoroscopy the catheter is packed with 5000 units of heparin per lumen.  Catheter secured to the skin of the right chest wall with 0 silk. A sterile dressing is applied with a Biopatch.  COMPLICATIONS: None  CONDITION: Good  Hortencia Pilar [name]. Office:  [phone]   12/10/2019,3:45 PM

## 2019-12-10 NOTE — Progress Notes (Signed)
[facility] [place] NAME: Daniel Thomas    MR#:  [id number]  DATE OF BIRTH:  1955/01/14  SUBJECTIVE:  patient came in with acute shortness of breath. And then spiked a fever 103. Perm cath removed.   Patient denies shortness of breath.  No documented fever. No new complaints and wondering when he can go home.  REVIEW OF SYSTEMS:   Review of Systems  Constitutional: Negative for chills, fever and weight loss.  HENT: Negative for ear discharge, ear pain and nosebleeds.   Eyes: Negative for blurred vision, pain and discharge.  Respiratory: Negative for sputum production, shortness of breath, wheezing and stridor.   Cardiovascular: Negative for chest pain, palpitations, orthopnea and PND.  Gastrointestinal: Negative for abdominal pain, diarrhea, nausea and vomiting.  Genitourinary: Negative for frequency and urgency.  Musculoskeletal: Negative for back pain and joint pain.  Neurological: Positive for weakness. Negative for sensory change, speech change and focal weakness.  Psychiatric/Behavioral: Negative for depression and hallucinations. The patient is not nervous/anxious.    Tolerating Diet:yes Tolerating PT: independent  DRUG ALLERGIES:   Allergies  Allergen Reactions  . Tape Dermatitis and Rash    VITALS:  Blood pressure (!) 189/99, pulse 70, temperature 97.6 F (36.4 C), temperature source Oral, resp. rate 16, height 5\' 1"  (1.549 m), weight 77.1 kg, SpO2 100 %.  PHYSICAL EXAMINATION:   Physical Exam  GENERAL:  64 y.o.-year-old patient lying in the bed with no acute distress.  HEENT: Head atraumatic, normocephalic. Oropharynx and nasopharynx clear.  LUNGS: Normal breath sounds bilaterally, no wheezing, rales, rhonchi. No use of accessory muscles of respiration.  CARDIOVASCULAR: S1, S2 normal. No murmurs, rubs, or gallops.  ABDOMEN: Soft, nontender, nondistended. Bowel sounds present. No organomegaly or mass.  EXTREMITIES:  No cyanosis, clubbing  + edema b/l.    NEUROLOGIC: Cranial nerves II through XII are intact. No focal Motor or sensory deficits b/l.   PSYCHIATRIC:  patient is alert and oriented x 3.  SKIN: No obvious rash, lesion, or ulcer.   LABORATORY PANEL:  CBC Recent Labs  Lab 12/09/19 0832  WBC 9.3  HGB 8.4*  HCT 24.8*  PLT 183    Chemistries  Recent Labs  Lab 12/05/19 0402 12/06/19 0357 12/09/19 0832  NA 135   < > 128*  K 5.2*   < > 5.2*  CL 101   < > 93*  CO2 21*   < > 17*  GLUCOSE 117*   < > 108*  BUN 59*   < > 113*  CREATININE 8.31*   < > 12.09*  CALCIUM 8.5*   < > 8.0*  MG 2.2  --   --   AST 26  --   --   ALT 26  --   --   ALKPHOS 105  --   --   BILITOT 1.1  --   --    < > = values in this interval not displayed.   Cardiac Enzymes No results for input(s): TROPONINI in the last 168 hours. RADIOLOGY:  No results found. ASSESSMENT AND PLAN:  Daniel Thomas  is a 64 y.o. African-American male with a known history of COPD, type 2 diabetes mellitus, hypertension, dyslipidemia and end-stage renal disease on hemodialysis, who presented to the emergency room with acute onset of respiratory distress with worsening dyspnea.  Severe sepsis present on admission with acute hypoxic respiratory failure sepsis resolved -- etiology suspected cath infection  -- cath tip positive  for methicillin-resistant staph aureus --ID consult appreciated. Recommends IV vancomycin at dialysis till 01/03/20 --blood culture so far negative --repeat [facility] 12/6-- negative -- sepsis resolved  Leukocytosis in the setting of sepsis and Solu-Medrol -- came in with white count of 16.4-- 32.8--14.4 -- Solu-Medrol discontinued -- patient afebrile  Acute hypoxic respiratory failure due to COPD exacerbation and congestive heart failure diastolic  -- resolved with patient is on room air -- came in with ABG showing pH of 7.3 -- placed on BiPAP-- now on room air -- suspected due to sepsis and acute on chronic  diastolic congestive heart failure and required urgent HD with UF  Acute on chronic diastolic congestive heart failure with hypoxia -- patient got dialysis with ultrafiltration at admission -- Currently on room air.  End-stage renal disease on hemodialysis. -- Dialysis perm cath removed due to staph infection -- vascular surgery consultation noted---to get HD Access today  Thyroid mass seen on CT scan -- TSH and free T4 within normal range -- will defer to PCP Dr. Lennox Thomas to arrange outpatient referral to endocrinology. Patient has been informed to follow-up as outpatient.  Elevated PSA -- prostate MRI done--results seen --  Dr. Kathrin Thomas informed via secure chat of results and have pt f/u as outpt for further evaluaiton as outpatient.  Essential hypertension -on Coreg, hydralazine, Norvasc and losartan  Depression -- on Celexa  Procedures: cath removal by vascular on 12/03 dialysis catheter placement on 12/7 Family communication :brother Daniel Thomas on the phone Consults :nephrology, vascular, ID CODE STATUS: FULL DVT Prophylaxis :Heparin  Status is: Inpatient  Remains inpatient appropriate because:Inpatient level of care appropriate due to severity of illness   Dispo: The patient is from: Home              Anticipated d/c is to: Home              Anticipated d/c date is: today              Patient currently is stable to discharge after dialysis Sepsis resolved will likely discharge after dialysis today depending on what time in the evening it gets over.    TOTAL TIME TAKING CARE OF THIS PATIENT: 25 minutes.  >50% time spent on counselling and coordination of care  Note: This dictation was prepared with Dragon dictation along with smaller phrase technology. Any transcriptional errors that result from this process are unintentional.  Daniel Thomas M.D    [facility]   CC: Primary care physician; Daniel Thomas, MDPatient ID: Daniel Thomas, male    DOB: March 18, 1955, 64 y.o.   MRN: [id number]

## 2019-12-11 ENCOUNTER — Encounter: Payer: Self-pay | Admitting: Vascular Surgery

## 2019-12-11 DIAGNOSIS — T827XXA Infection and inflammatory reaction due to other cardiac and vascular devices, implants and grafts, initial encounter: Secondary | ICD-10-CM | POA: Diagnosis not present

## 2019-12-11 DIAGNOSIS — B9562 Methicillin resistant Staphylococcus aureus infection as the cause of diseases classified elsewhere: Secondary | ICD-10-CM | POA: Diagnosis not present

## 2019-12-11 DIAGNOSIS — I132 Hypertensive heart and chronic kidney disease with heart failure and with stage 5 chronic kidney disease, or end stage renal disease: Secondary | ICD-10-CM | POA: Diagnosis not present

## 2019-12-11 DIAGNOSIS — J9601 Acute respiratory failure with hypoxia: Secondary | ICD-10-CM | POA: Diagnosis not present

## 2019-12-11 MED ORDER — HYDRALAZINE HCL 50 MG PO TABS
50.0000 mg | ORAL_TABLET | Freq: Three times a day (TID) | ORAL | Status: DC
  Administered 2019-12-11: 50 mg via ORAL
  Filled 2019-12-11: qty 1

## 2019-12-11 MED ORDER — CARVEDILOL 25 MG PO TABS: 25.0000 mg | ORAL_TABLET | Freq: Two times a day (BID) | ORAL | Status: DC

## 2019-12-11 MED ORDER — VANCOMYCIN IV (FOR PTA / DISCHARGE USE ONLY): 750.0000 mg | INTRAVENOUS | 0 refills | Status: AC

## 2019-12-11 MED ORDER — POLYVINYL ALCOHOL 1.4 % OP SOLN
1.0000 [drp] | OPHTHALMIC | Status: DC | PRN
  Administered 2019-12-11: 1 [drp] via OPHTHALMIC
  Filled 2019-12-11: qty 15

## 2019-12-11 NOTE — Progress Notes (Signed)
Dc instructions given pt verbalizes understanding, iv dc'd tolerated well. Pt to be taken out via w/c in apparent stable condition

## 2019-12-11 NOTE — Discharge Summary (Addendum)
.   Physician Discharge Summary  [name] QBH:[id number] DOB: 1955-08-03 DOA: 12/05/2019  PCP: Marguerita Merles, MD  Admit date: 12/05/2019 Discharge date: 12/12/2019  Discharge disposition: Home   Recommendations for Outpatient Follow-Up:   1. Follow up with PCP in 1 week. 2. Follow up with nephrologist as an outpatient for hemodialysis as scheduled.    Discharge Diagnosis:   Active Problems:   Hypertensive emergency   Severe sepsis ([place])   ESRD (end stage renal disease) ([place])   COPD exacerbation (H[facility])   Acute respiratory failure ([facility])   SIRS (systemic inflammatory response syndrome) ([place])   Acute on chronic congestive heart failure ([place])    Discharge Condition: Stable.  Diet recommendation:  Diet Order            Diet - low sodium heart healthy                   Code Status: Prior     Hospital Course:   [name] y.o.African-American malewith a known history of COPD, type 2 diabetes mellitus, hypertension, history of prostate cancer on surveillance, dyslipidemia and end-stage renal disease on hemodialysis, who presented to the emergency room with acute onset of respiratory distress.   He was admitted to the hospital for acute hypoxemic respiratory failure secondary to COPD exacerbation and acute on chronic diastolic CHF and fluid overload from ESRD.  Blood pressure was severely elevated consistent with hypertensive emergency.  He was treated with IV Lasix and IV nitroglycerin infusion.  He was evaluated by the nephrologist and he underwent hemodialysis.  He was also found to have severe MRSA sepsis.  He had a dialysis permacath in the left side of the neck which was suspected to be the source of infection.  Dialysis permacath was removed on 12/06/2019 and the tip of the catheter was cultured.  It came back positive for MRSA.  He was treated with IV vancomycin.  ID was consulted to assist with management.  A new tunneled hemodialysis catheter  was inserted via the right external jugular approach by the vascular surgeon on 12/10/2019.  ID recommended IV vancomycin to be given at outpatient hemodialysis center through 01/03/2020.  There was some concern that patient had allergic dermatitis from dressing on the old dialysis permacath on the left side of the neck.  ID discussed this with the nephrologist and the dialysis staff to see if a different type of aggressive could be used to cover his permacath.  His condition has improved and is deemed stable for discharge to home.  Medical Consultants:    Infectious disease  Nephrologist  Vascular surgeon   Discharge Exam:    Vitals:   12/11/19 0526 12/11/19 0740 12/11/19 1149 12/11/19 1607  BP: (!) 176/83 (!) 190/87 (!) 155/76 (!) 178/75  Pulse: 81 80 74 79  Resp:  17 17 16   Temp:  98.4 F (36.9 C) 98.2 F (36.8 C) 98.4 F (36.9 C)  TempSrc:      SpO2: 100% 100% 100% 100%  Weight:      Height:         GEN: NAD SKIN: Warm and dry EYES: No pallor or icterus ENT: MMM CV: RRR PULM: CTA B ABD: soft, obese, NT, +BS CNS: AAO x 3, non focal EXT: No edema or tenderness   The results of significant diagnostics from this hospitalization (including imaging, microbiology, ancillary and laboratory) are listed below for reference.     Procedures and Diagnostic Studies:  CT ANGIO CHEST PE W OR WO CONTRAST  Result Date: 12/05/2019 CLINICAL DATA:  Shortness of breath EXAM: CT ANGIOGRAPHY CHEST WITH CONTRAST TECHNIQUE: Multidetector CT imaging of the chest was performed using the standard protocol during bolus administration of intravenous contrast. Multiplanar CT image reconstructions and MIPs were obtained to evaluate the vascular anatomy. CONTRAST:  29mL OMNIPAQUE IOHEXOL 350 MG/ML SOLN COMPARISON:  Chest radiograph December 05, 2019 FINDINGS: Cardiovascular: There is no evident pulmonary embolus. There is no thoracic aortic aneurysm or dissection. There are scattered foci of  calcification in proximal visualized great vessels. There are foci of aortic atherosclerosis. There are foci of coronary artery calcification at several sites. There is no pericardial effusion or pericardial thickening evident. Mediastinum/Nodes: There is a mass in the left lobe of the thyroid measuring 1.7 x 1.6 cm. Thyroid appears somewhat inhomogeneous elsewhere. There are scattered subcentimeter mediastinal lymph nodes. There is a subcarinal lymph node measuring 1.4 x 1.3 cm. No other adenopathy by size criteria evident in the thoracic region. Note that there are several upper normal in size axillary lymph nodes bilaterally. There is a small hiatal hernia. Lungs/Pleura: There is mild bibasilar lung atelectatic change. There is no evident edema or airspace opacity. No appreciable pleural effusions. Upper Abdomen: There is cholelithiasis. There is upper abdominal aortic atherosclerosis. Stomach mildly distended with food material. Visualized upper abdominal structures otherwise appear unremarkable. Musculoskeletal: Superior endplate fracture at T9. Osteoarthritic change noted in the thoracic spine with diffuse idiopathic skeletal hyperostosis in the lower thoracic region. No blastic or lytic bone lesions. No chest wall lesions. Review of the MIP images confirms the above findings. IMPRESSION: 1. No demonstrable pulmonary embolus. No thoracic aortic aneurysm or dissection. There is aortic atherosclerosis as well as foci of great vessel and coronary artery calcification. 2.  No edema or airspace opacity.  Bibasilar atelectasis. 3. 1.4 x 1.3 cm subcarinal lymph node. No other adenopathy by size criteria. Etiology for this lymph node enlargement uncertain. 4. Mass left lobe thyroid measuring 1.7 x 1.6 cm. Recommend thyroid US per consensus guidelines. (Ref: J Am Coll Radiol. 2015 Feb;12(2): 143-50).Thyroid elsewhere appears somewhat inhomogeneous. 5.  Small hiatal hernia. 6.  Cholelithiasis. 7.  Diffuse idiopathic  skeletal hyperostosis in the thoracic spine. Aortic Atherosclerosis (ICD10-I70.0). Electronically Signed   By: Lowella Grip III M.D.   On: 12/05/2019 10:29   PERIPHERAL VASCULAR CATHETERIZATION  Result Date: 12/06/2019 See op note  MR PROSTATE WO CONTRAST  Addendum Date: 12/09/2019   ADDENDUM REPORT: 12/09/2019 08:57 ADDENDUM: Contrast administration could be considered on follow-up for further evaluation in the setting of dialysis with MRI if a group 2 is utilized. Would suggest discussion with urology for further evaluation with additional imaging as warranted. These results were called by telephone at the time of interpretation on 12/09/2019 at 8:56 am to provider Dr. Posey Pronto, Who verbally acknowledged these results. Electronically Signed   By: Zetta Bills M.D.   On: 12/09/2019 08:57   Result Date: 12/09/2019 CLINICAL DATA:  History of prostate cancer on biopsy with elevated PSA. EXAM: MR PROSTATE WITHOUT CONTRAST TECHNIQUE: Multiplanar multisequence MRI images were obtained of the pelvis centered about the prostate, without contrast administration. COMPARISON:  CT of the abdomen and pelvis of November 10, 2019 FINDINGS: Prostate: Heterogeneous prostate with signs of BPH. Transitional zone: BPH nodules in the bilateral transitional zone. Heterogeneity with mildly heterogeneous area of low T2 signal in the LEFT mid to apical transitional zone (image 16, series 4) 1.5 cm appears to be  within a BPH nodule along the inferior and mid LEFT paramidline transitional zone PIRADS category 3. Peripheral zone: Area of T2 hypointensity measuring 14 mm in the LEFT paramidline peripheral zone adjacent to the above lesion in the transitional zone showing dark ADC characteristics and increased signal on calculated high B value images. PIRADS category 4 in the LEFT apical peripheral zone. (Image 18, series 7) Volume: 59.2 cc Transcapsular spread:  Absent Seminal vesicle involvement: Absent Neurovascular bundle  involvement: Absent Pelvic adenopathy: Absent but with nodule along the posterior RIGHT mesorectal fascia and pelvic floor musculature (image 17 of series 9 measuring) 1.9 by 1.4 cm with predominantly low T2 signal. Bone metastasis: Absent Other findings: Moderate-sized bilateral fat containing inguinal hernias LEFT greater than RIGHT. Small umbilical hernia partially imaged. IMPRESSION: 1. PIRADS category 4 lesion in the LEFT apical peripheral zone. 2. PIRADS category 3 lesion in the LEFT mid to apical transitional zone. 3. Nodule in the RIGHT posterior extraperitoneal pelvis adjacent to the mesorectal fascia and pelvic floor musculature. Findings could represent a metastatic lesion or finding such as nerve sheath tumor though this appears to be removed from the sciatic nerve. Comparison with more remote prior imaging if available could be helpful for further evaluation. 4. Moderate-sized bilateral fat containing inguinal hernias LEFT greater than RIGHT. 5. Small umbilical hernia. Electronically Signed: By: Zetta Bills M.D. On: 12/09/2019 08:02   DG Chest Port 1 View  Result Date: 12/05/2019 CLINICAL DATA:  Dyspnea, congestive heart failure EXAM: PORTABLE CHEST 1 VIEW COMPARISON:  06/29/2019 FINDINGS: Lungs are clear. No pneumothorax or pleural effusion. Left internal jugular hemodialysis catheter tip is seen at the superior right atrium. Cardiac size within normal limits. Pulmonary vascularity is normal. No acute bone abnormality. IMPRESSION: No active disease. Electronically Signed   By: Fidela Salisbury MD   On: 12/05/2019 04:29     Labs:   Basic Metabolic Panel: Recent Labs  Lab 12/06/19 0357 12/08/19 0603 12/09/19 0832  NA 135 129* 128*  K 4.2 5.0 5.2*  CL 97* 94* 93*  CO2 22 18* 17*  GLUCOSE 156* 115* 108*  BUN 43* 99* 113*  CREATININE 6.37* 10.95* 12.09*  CALCIUM 8.6* 8.0* 8.0*  PHOS  --   --  7.8*   GFR Estimated Creatinine Clearance: 5.4 mL/min (A) (by C-G formula based on SCr  of 12.09 mg/dL (H)). Liver Function Tests: No results for input(s): AST, ALT, ALKPHOS, BILITOT, PROT, ALBUMIN in the last 168 hours. No results for input(s): LIPASE, AMYLASE in the last 168 hours. No results for input(s): AMMONIA in the last 168 hours. Coagulation profile No results for input(s): INR, PROTIME in the last 168 hours.  CBC: Recent Labs  Lab 12/06/19 0357 12/08/19 0603 12/09/19 0832  WBC 32.8* 14.4* 9.3  HGB 8.3* 8.2* 8.4*  HCT 25.4* 24.6* 24.8*  MCV 93.7 91.8 90.2  PLT 306 237 183   Cardiac Enzymes: No results for input(s): CKTOTAL, CKMB, CKMBINDEX, TROPONINI in the last 168 hours. BNP: Invalid input(s): POCBNP CBG: Recent Labs  Lab 12/10/19 1336 12/10/19 1549  GLUCAP 83 99   D-Dimer No results for input(s): DDIMER in the last 72 hours. Hgb A1c No results for input(s): HGBA1C in the last 72 hours. Lipid Profile No results for input(s): CHOL, HDL, LDLCALC, TRIG, CHOLHDL, LDLDIRECT in the last 72 hours. Thyroid function studies No results for input(s): TSH, T4TOTAL, T3FREE, THYROIDAB in the last 72 hours.  Invalid input(s): FREET3 Anemia work up No results for input(s): VITAMINB12, FOLATE, FERRITIN,  TIBC, IRON, RETICCTPCT in the last 72 hours. Microbiology Recent Results (from the past 240 hour(s))  Resp Panel by RT-PCR (Flu A&B, Covid) Nasopharyngeal Swab     Status: None   Collection Time: 12/05/19  4:02 AM   Specimen: Nasopharyngeal Swab; Nasopharyngeal(NP) swabs in vial transport medium  Result Value Ref Range Status   SARS Coronavirus 2 by RT PCR NEGATIVE NEGATIVE Final    Comment: (NOTE) SARS-CoV-2 target nucleic acids are NOT DETECTED.  The SARS-CoV-2 RNA is generally detectable in upper respiratory specimens during the acute phase of infection. The lowest concentration of SARS-CoV-2 viral copies this assay can detect is 138 copies/mL. A negative result does not preclude SARS-Cov-2 infection and should not be used as the sole basis for  treatment or other patient management decisions. A negative result may occur with  improper specimen collection/handling, submission of specimen other than nasopharyngeal swab, presence of viral mutation(s) within the areas targeted by this assay, and inadequate number of viral copies(<138 copies/mL). A negative result must be combined with clinical observations, patient history, and epidemiological information. The expected result is Negative.  Fact Sheet for Patients:  EntrepreneurPulse.com.au  Fact Sheet for Healthcare Providers:  IncredibleEmployment.be  This test is no t yet approved or cleared by the [place] FDA and  has been authorized for detection and/or diagnosis of SARS-CoV-2 by FDA under an Emergency Use Authorization (EUA). This EUA will remain  in effect (meaning this test can be used) for the duration of the COVID-19 declaration under Section 564(b)(1) of the Act, 21 U.S.C.section 360bbb-3(b)(1), unless the authorization is terminated  or revoked sooner.       Influenza A by PCR NEGATIVE NEGATIVE Final   Influenza B by PCR NEGATIVE NEGATIVE Final    Comment: (NOTE) The Xpert Xpress SARS-CoV-2/FLU/RSV plus assay is intended as an aid in the diagnosis of influenza from Nasopharyngeal swab specimens and should not be used as a sole basis for treatment. Nasal washings and aspirates are unacceptable for Xpert Xpress SARS-CoV-2/FLU/RSV testing.  Fact Sheet for Patients: EntrepreneurPulse.com.au  Fact Sheet for Healthcare Providers: IncredibleEmployment.be  This test is not yet approved or cleared by the [place] FDA and has been authorized for detection and/or diagnosis of SARS-CoV-2 by FDA under an Emergency Use Authorization (EUA). This EUA will remain in effect (meaning this test can be used) for the duration of the COVID-19 declaration under Section 564(b)(1) of the Act, 21  U.S.C. section 360bbb-3(b)(1), unless the authorization is terminated or revoked.  Performed at [facility], [address] [place], [place] [address]   Culture, blood (Routine X 2) w Reflex to ID Panel     Status: None   Collection Time: 12/05/19  6:26 AM   Specimen: BLOOD  Result Value Ref Range Status   Specimen Description BLOOD LEFT ANTECUBITAL  Final   Special Requests   Final    BOTTLES DRAWN AEROBIC AND ANAEROBIC Blood Culture results may not be optimal due to an excessive volume of blood received in culture bottles   Culture   Final    NO GROWTH 5 DAYS Performed at [facility] [address], [place], [place] [address]    Report Status 12/10/2019 FINAL  Final  Culture, blood (Routine X 2) w Reflex to ID Panel     Status: None   Collection Time: 12/05/19  6:26 AM   Specimen: BLOOD  Result Value Ref Range Status   Specimen Description BLOOD RIGHT ANTECUBITAL  Final   Special  Requests   Final    BOTTLES DRAWN AEROBIC AND ANAEROBIC Blood Culture results may not be optimal due to an excessive volume of blood received in culture bottles   Culture   Final    NO GROWTH 5 DAYS Performed at [facility], [address], [place], [place] [address]    Report Status 12/10/2019 FINAL  Final  Cath Tip Culture     Status: Abnormal   Collection Time: 12/06/19 12:39 PM   Specimen: Catheter Tip; Other  Result Value Ref Range Status   Specimen Description   Final    CATH TIP Performed at [facility], [address] [place], [place] [address]    Special Requests   Final    NONE Performed at [facility], [address] [place], [place] [address]    Culture (A)  Final    >=100,000 COLONIES/mL METHICILLIN RESISTANT STAPHYLOCOCCUS AUREUS   Report Status 12/10/2019 FINAL  Final   Organism ID, Bacteria METHICILLIN RESISTANT STAPHYLOCOCCUS AUREUS (A)  Final      Susceptibility   Methicillin resistant staphylococcus aureus - MIC*     CIPROFLOXACIN >=8 RESISTANT Resistant     ERYTHROMYCIN >=8 RESISTANT Resistant     GENTAMICIN <=0.5 SENSITIVE Sensitive     OXACILLIN >=4 RESISTANT Resistant     TETRACYCLINE <=1 SENSITIVE Sensitive     VANCOMYCIN 1 SENSITIVE Sensitive     TRIMETH/SULFA <=10 SENSITIVE Sensitive     CLINDAMYCIN >=8 RESISTANT Resistant     RIFAMPIN <=0.5 SENSITIVE Sensitive     Inducible Clindamycin NEGATIVE Sensitive     * >=100,000 COLONIES/mL METHICILLIN RESISTANT STAPHYLOCOCCUS AUREUS  Culture, blood (Routine X 2) w Reflex to ID Panel     Status: None (Preliminary result)   Collection Time: 12/09/19  8:32 AM   Specimen: BLOOD  Result Value Ref Range Status   Specimen Description BLOOD BLOOD RIGHT FOREARM  Final   Special Requests   Final    BOTTLES DRAWN AEROBIC AND ANAEROBIC Blood Culture adequate volume   Culture   Final    NO GROWTH 3 DAYS Performed at [facility], [address], [place], [place] [address]    Report Status PENDING  Incomplete  Culture, blood (Routine X 2) w Reflex to ID Panel     Status: None (Preliminary result)   Collection Time: 12/09/19  8:42 AM   Specimen: BLOOD  Result Value Ref Range Status   Specimen Description BLOOD RIGHT ANTECUBITAL  Final   Special Requests   Final    BOTTLES DRAWN AEROBIC AND ANAEROBIC Blood Culture adequate volume   Culture   Final    NO GROWTH 3 DAYS Performed at [facility], [address] [place],  [address]    Report Status PENDING  Incomplete     Discharge Instructions:   Discharge Instructions    Diet - low sodium heart healthy   Complete by: As directed    Discharge instructions   Complete by: As directed    Continue hemodialysis on Tuesdays, Thursdays and Saturdays.  Check Blood culture on 01/11/20 Saturday   Fax weekly labs to [place] [phone]  Clinic Follow Up Appt: 12/31/19 noon - virtual video visit   Home infusion instructions   Complete by: As directed     Instructions: Flushing of vascular access device: 0.9% NaCl pre/post medication administration and prn patency; Heparin 100 u/ml, 59ml for implanted ports and Heparin 10u/ml, 24ml for all other central venous catheters.  Increase activity slowly   Complete by: As directed      Allergies as of 12/11/2019      Reactions   Tape Dermatitis, Rash      Medication List    TAKE these medications   albuterol 108 (90 Base) MCG/ACT inhaler Commonly known as: VENTOLIN HFA Inhale 2 puffs into the lungs every 6 (six) hours as needed for wheezing or shortness of breath.   amLODipine 10 MG tablet Commonly known as: NORVASC Take 1 tablet (10 mg total) by mouth at bedtime.   atorvastatin 40 MG tablet Commonly known as: LIPITOR Take 40 mg by mouth at bedtime.   Auryxia 1 GM 210 MG(Fe) tablet Generic drug: ferric citrate Take by mouth.   calcium carbonate 500 MG chewable tablet Commonly known as: TUMS - dosed in mg elemental calcium Chew 1 tablet by mouth every 6 (six) hours as needed for indigestion.   carvedilol 12.5 MG tablet Commonly known as: COREG Take 12.5 mg by mouth 2 (two) times daily with a meal.   citalopram 20 MG tablet Commonly known as: CELEXA Take 20 mg by mouth daily.   Drisdol 1.25 MG (50000 UT) capsule Generic drug: ergocalciferol Take by mouth. Administered at analysis   hydrALAZINE 25 MG tablet Commonly known as: APRESOLINE Take 1 tablet (25 mg total) by mouth every 8 (eight) hours.   lidocaine 5 % Commonly known as: Lidoderm Place 1 patch onto the skin every 12 (twelve) hours. Remove & Discard patch within 12 hours or as directed by MD   linagliptin 5 MG Tabs tablet Commonly known as: TRADJENTA Take 1 tablet (5 mg total) by mouth daily.   losartan 50 MG tablet Commonly known as: COZAAR Take 1 tablet (50 mg total) by mouth at bedtime.   meloxicam 7.5 MG tablet Commonly known as: MOBIC Take 7.5 mg by mouth daily.   MIRCERA IJ Mircera   predniSONE 20  MG tablet Commonly known as: DELTASONE 2 tabs po daily for 4 days   tiZANidine 2 MG tablet Commonly known as: ZANAFLEX Take 2 mg by mouth 3 (three) times daily as needed.   traZODone 50 MG tablet Commonly known as: DESYREL Take 50 mg by mouth at bedtime as needed for sleep.   vancomycin  IVPB Inject 750 mg into the vein Every Tuesday,Thursday,and Saturday with dialysis for 10 doses. 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   12/11/19 1255            Time coordinating discharge: 35 minutes  Signed:  Ambrea Hegler  [facility] 12/12/2019, 9:05 PM   Pager on [email or web address]. If 7PM-7AM, please contact night-coverage at [email or web address]

## 2019-12-11 NOTE — Treatment Plan (Signed)
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 Tape Dermatitis and Rash    OPAT Orders Discharge antibiotics: Vancomycin 750mg  IV during dialysis days on Tuesday/Thursday and Saturday Duration: 4 weeks End Date: 01/03/20  Labs weekly while on IV antibiotics: _X_ CBC with differential  _X_ CMP  _X_ Vancomycin random  Check Blood culture on 01/11/20 Saturday   Fax weekly labs to [place] [phone]  Clinic Follow Up Appt: 12/31/19 noon - virtual video visit   Call [phone] with any questions

## 2019-12-11 NOTE — Progress Notes (Signed)
[facility], [place] 12/11/19  Subjective:   LOS: 6 Patient known to our practice from previous admissions.Presents to ER for shortness of breath that started in the middle of the night.He did not go to his dialysis treatment but came to the ER for evaluation.Upon presentation, blood pressure was 222/102.Patient was given Nitropaste and placed on BiPAP. Patient was urgently dialyzed on December 2, 2 L of fluid was removed.Post dialysis, patient experienced confusion and had a temperature of 103. PermCath infection was suspected.Vascular surgery was consulted and PermCath was removed on 12/06/2019.  Update  Patient underwent vascular procedure yesterday, to establish dialysis access.  He received dialysis treatment after the procedure, tolerated well  Objective:  Vital signs in last 24 hours:  Temp:  [97.5 F (36.4 C)-98.4 F (36.9 C)] 98.4 F (36.9 C) (12/08 1607) Pulse Rate:  [70-81] 79 (12/08 1607) Resp:  [11-17] 16 (12/08 1607) BP: (102-190)/(62-91) 178/75 (12/08 1607) SpO2:  [98 %-100 %] 100 % (12/08 1607) Weight:  [75.7 kg] 75.7 kg (12/08 0500)  Weight change: 0.511 kg Filed Weights   12/10/19 0615 12/10/19 1329 12/11/19 0500  Weight: 76.6 kg 77.1 kg 75.7 kg    Intake/Output:    Intake/Output Summary (Last 24 hours) at 12/11/2019 1838 Last data filed at 12/11/2019 0400 Gross per 24 hour  Intake 151.52 ml  Output 0 ml  Net 151.52 ml    Physical Exam: General:  Lying flat in bed, in no acute distress  HEENT  oral mucous membranes moist  Pulm/lungs  lungs clear to auscultation bilaterally, normal respiratory effort  CVS/Heart  S1-S2, no rubs or gallops  Abdomen:   Soft, nontender, nondistended  Extremities:  No peripheral edema  Neurologic:  Awake, alert, oriented  Skin:  Rashes +  around catheter site   Right chest PermCath    Basic Metabolic Panel:  Recent Labs  Lab 12/05/19 0402 12/05/19 0402 12/06/19 0357 12/08/19 0603  12/09/19 0832  NA 135  --  135 129* 128*  K 5.2*  --  4.2 5.0 5.2*  CL 101  --  97* 94* 93*  CO2 21*  --  22 18* 17*  GLUCOSE 117*  --  156* 115* 108*  BUN 59*  --  43* 99* 113*  CREATININE 8.31*  --  6.37* 10.95* 12.09*  CALCIUM 8.5*   < > 8.6* 8.0* 8.0*  MG 2.2  --   --   --   --   PHOS 6.6*  --   --   --  7.8*   < > = values in this interval not displayed.     CBC: Recent Labs  Lab 12/05/19 0402 12/06/19 0357 12/08/19 0603 12/09/19 0832  WBC 16.4* 32.8* 14.4* 9.3  NEUTROABS 13.3*  --   --   --   HGB 9.8* 8.3* 8.2* 8.4*  HCT 29.5* 25.4* 24.6* 24.8*  MCV 93.4 93.7 91.8 90.2  PLT 368 306 237 183      Lab Results  Component Value Date   HEPBSAG NON REACTIVE 05/17/2019   HEPBSAB Non Reactive 01/12/2018   HEPBIGM Negative 01/12/2018      Microbiology:  Recent Results (from the past 240 hour(s))  Resp Panel by RT-PCR (Flu A&B, Covid) Nasopharyngeal Swab     Status: None   Collection Time: 12/05/19  4:02 AM   Specimen: Nasopharyngeal Swab; Nasopharyngeal(NP) swabs in vial transport medium  Result Value Ref Range Status   SARS Coronavirus 2 by RT PCR NEGATIVE NEGATIVE Final  Comment: (NOTE) SARS-CoV-2 target nucleic acids are NOT DETECTED.  The SARS-CoV-2 RNA is generally detectable in upper respiratory specimens during the acute phase of infection. The lowest concentration of SARS-CoV-2 viral copies this assay can detect is 138 copies/mL. A negative result does not preclude SARS-Cov-2 infection and should not be used as the sole basis for treatment or other patient management decisions. A negative result may occur with  improper specimen collection/handling, submission of specimen other than nasopharyngeal swab, presence of viral mutation(s) within the areas targeted by this assay, and inadequate number of viral copies(<138 copies/mL). A negative result must be combined with clinical observations, patient history, and epidemiological information. The  expected result is Negative.  Fact Sheet for Patients:  EntrepreneurPulse.com.au  Fact Sheet for Healthcare Providers:  IncredibleEmployment.be  This test is no t yet approved or cleared by the [place] FDA and  has been authorized for detection and/or diagnosis of SARS-CoV-2 by FDA under an Emergency Use Authorization (EUA). This EUA will remain  in effect (meaning this test can be used) for the duration of the COVID-19 declaration under Section 564(b)(1) of the Act, 21 U.S.C.section 360bbb-3(b)(1), unless the authorization is terminated  or revoked sooner.       Influenza A by PCR NEGATIVE NEGATIVE Final   Influenza B by PCR NEGATIVE NEGATIVE Final    Comment: (NOTE) The Xpert Xpress SARS-CoV-2/FLU/RSV plus assay is intended as an aid in the diagnosis of influenza from Nasopharyngeal swab specimens and should not be used as a sole basis for treatment. Nasal washings and aspirates are unacceptable for Xpert Xpress SARS-CoV-2/FLU/RSV testing.  Fact Sheet for Patients: EntrepreneurPulse.com.au  Fact Sheet for Healthcare Providers: IncredibleEmployment.be  This test is not yet approved or cleared by the [place] FDA and has been authorized for detection and/or diagnosis of SARS-CoV-2 by FDA under an Emergency Use Authorization (EUA). This EUA will remain in effect (meaning this test can be used) for the duration of the COVID-19 declaration under Section 564(b)(1) of the Act, 21 U.S.C. section 360bbb-3(b)(1), unless the authorization is terminated or revoked.  Performed at [facility], [address] [place], [place] [address]   Culture, blood (Routine X 2) w Reflex to ID Panel     Status: None   Collection Time: 12/05/19  6:26 AM   Specimen: BLOOD  Result Value Ref Range Status   Specimen Description BLOOD LEFT ANTECUBITAL  Final   Special Requests   Final    BOTTLES DRAWN  AEROBIC AND ANAEROBIC Blood Culture results may not be optimal due to an excessive volume of blood received in culture bottles   Culture   Final    NO GROWTH 5 DAYS Performed at [facility], [address] [place], [place] [address]    Report Status 12/10/2019 FINAL  Final  Culture, blood (Routine X 2) w Reflex to ID Panel     Status: None   Collection Time: 12/05/19  6:26 AM   Specimen: BLOOD  Result Value Ref Range Status   Specimen Description BLOOD RIGHT ANTECUBITAL  Final   Special Requests   Final    BOTTLES DRAWN AEROBIC AND ANAEROBIC Blood Culture results may not be optimal due to an excessive volume of blood received in culture bottles   Culture   Final    NO GROWTH 5 DAYS Performed at [facility], [address] [place], [place] [address]    Report Status 12/10/2019 FINAL  Final  Cath Tip Culture  Status: Abnormal   Collection Time: 12/06/19 12:39 PM   Specimen: Catheter Tip; Other  Result Value Ref Range Status   Specimen Description   Final    CATH TIP Performed at [facility], [address] [place], [place] [address]    Special Requests   Final    NONE Performed at [facility], [address] [place], [place] [address]    Culture (A)  Final    >=100,000 COLONIES/mL METHICILLIN RESISTANT STAPHYLOCOCCUS AUREUS   Report Status 12/10/2019 FINAL  Final   Organism ID, Bacteria METHICILLIN RESISTANT STAPHYLOCOCCUS AUREUS (A)  Final      Susceptibility   Methicillin resistant staphylococcus aureus - MIC*    CIPROFLOXACIN >=8 RESISTANT Resistant     ERYTHROMYCIN >=8 RESISTANT Resistant     GENTAMICIN <=0.5 SENSITIVE Sensitive     OXACILLIN >=4 RESISTANT Resistant     TETRACYCLINE <=1 SENSITIVE Sensitive     VANCOMYCIN 1 SENSITIVE Sensitive     TRIMETH/SULFA <=10 SENSITIVE Sensitive     CLINDAMYCIN >=8 RESISTANT Resistant     RIFAMPIN <=0.5 SENSITIVE Sensitive     Inducible Clindamycin NEGATIVE Sensitive     *  >=100,000 COLONIES/mL METHICILLIN RESISTANT STAPHYLOCOCCUS AUREUS  Culture, blood (Routine X 2) w Reflex to ID Panel     Status: None (Preliminary result)   Collection Time: 12/09/19  8:32 AM   Specimen: BLOOD  Result Value Ref Range Status   Specimen Description BLOOD BLOOD RIGHT FOREARM  Final   Special Requests   Final    BOTTLES DRAWN AEROBIC AND ANAEROBIC Blood Culture adequate volume   Culture   Final    NO GROWTH 2 DAYS Performed at [facility], [address] [place], [place] [address]    Report Status PENDING  Incomplete  Culture, blood (Routine X 2) w Reflex to ID Panel     Status: None (Preliminary result)   Collection Time: 12/09/19  8:42 AM   Specimen: BLOOD  Result Value Ref Range Status   Specimen Description BLOOD RIGHT ANTECUBITAL  Final   Special Requests   Final    BOTTLES DRAWN AEROBIC AND ANAEROBIC Blood Culture adequate volume   Culture   Final    NO GROWTH 2 DAYS Performed at [facility], [address] [place], [place] [address]    Report Status PENDING  Incomplete    Coagulation Studies: No results for input(s): LABPROT, INR in the last 72 hours.  Urinalysis: No results for input(s): COLORURINE, LABSPEC, PHURINE, GLUCOSEU, HGBUR, BILIRUBINUR, KETONESUR, PROTEINUR, UROBILINOGEN, NITRITE, LEUKOCYTESUR in the last 72 hours.  Invalid input(s): APPERANCEUR    Imaging: PERIPHERAL VASCULAR CATHETERIZATION  Result Date: 12/10/2019 See Op Note    Medications:   . sodium chloride    . clindamycin (CLEOCIN) IV    . vancomycin 750 mg (12/10/19 2233)   . carvedilol  25 mg Oral BID WC  . Chlorhexidine Gluconate Cloth  6 each Topical Q0600  . Chlorhexidine Gluconate Cloth  6 each Topical Q0600  . citalopram  20 mg Oral Daily  . epoetin (EPOGEN/PROCRIT) injection  4,000 Units Intravenous Q T,Th,Sa-HD  . ferric citrate  210 mg Oral TID AC  . fluticasone  2 spray Each Nare Daily  . heparin  5,000 Units Subcutaneous Q8H  .  hydrALAZINE  50 mg Oral Q8H  . lidocaine  1 patch Transdermal Q12H  . linagliptin  5 mg Oral Daily  . mupirocin ointment   Nasal BID  . sodium  chloride flush  3 mL Intravenous Q12H  . vancomycin variable dose per unstable renal function (pharmacist dosing)   Does not apply See admin instructions  . Vitamin D (Ergocalciferol)  50,000 Units Oral UD   sodium chloride, acetaminophen **OR** acetaminophen, hydrALAZINE, ondansetron **OR** ondansetron (ZOFRAN) IV, polyvinyl alcohol, sodium chloride flush, tiZANidine, traZODone  Assessment/ Plan:  64 y.o. male with COPD, diabetes mellitus type 2, BPH, prostate cancer followed bu urology hypertension, hyperlipidemia, ESRD on HD TTS, anemia of chronic kidney disease, secondary hyperparathyroidism  was admitted on 12/05/2019 for  Active Problems:   Hypertensive emergency   Severe sepsis ([place])   COPD exacerbation ([place])   Acute respiratory failure (HCC)   SIRS (systemic inflammatory response syndrome) (HCC)   Acute on chronic congestive heart failure (HCC)  Hyperkalemia [E87.5] COPD exacerbation (HCC) [J44.1] SIRS (systemic inflammatory response syndrome) (HCC) [R65.10] Hypertensive emergency [I16.1] Acute respiratory failure, unspecified whether with hypoxia or hypercapnia ([place]) [J96.00] Chronic kidney disease with end stage renal failure on dialysis ([place]) [N18.6, Z99.2] Acute on chronic congestive heart failure, unspecified heart failure type ([place]) [I50.9]  [facility] [address]/TTS Left IJ PermCath  #. ESRD #Shortness of breath, hypertensive emergency (malignant HTN)  # Fever Patient received dialysis treatment on 12/05/2019 We were allowing a catheter free period as he developed fever after the dialysis treatment. Catheter tip culture shows MRSA infection Blood culture repeated on 12/09/2019,with no growth in 24 hours  Patient underwent vascular procedure yesterday to establish dialysis access He received dialysis treatment after  the vascular procedure Volume and electrolyte status acceptable today No additional dialysis required today Patient will continue his regular schedule for dialysis TTS as outpatient  #. Anemia of CKD  Lab Results  Component Value Date   HGB 8.4 (L) 12/09/2019   Epogen as per outpatient protocol  #. Secondary hyperparathyroidism of renal origin N 25.81      Component Value Date/Time   PTH 138 (H) 05/17/2019 0336   Lab Results  Component Value Date   PHOS 7.8 (H) 12/09/2019   will continue management as outpatient   #. Diabetes type 2 with CKD Hgb A1c MFr Bld (%)  Date Value  12/06/2019 6.2 (H)  Blood glucose readings within acceptable range Management per primary team  #Hypertension Continue current antihypertensive regimen   LOS: [place] 12/8/20216:38 PM  [place], [place]

## 2019-12-11 NOTE — Progress Notes (Signed)
Date of Admission:  12/05/2019       ID: Daniel Thomas is a 64 y.o. male  Active Problems:   Hypertensive emergency   Severe sepsis ([name])   COPD exacerbation ([name])   Acute respiratory failure ([name])   SIRS (systemic inflammatory response syndrome) (HCC)   Acute on chronic congestive heart failure (HCC)    Subjective: Pt is doing better No fever No sob   Medications:  . carvedilol  25 mg Oral BID WC  . Chlorhexidine Gluconate Cloth  6 each Topical Q0600  . Chlorhexidine Gluconate Cloth  6 each Topical Q0600  . citalopram  20 mg Oral Daily  . epoetin (EPOGEN/PROCRIT) injection  4,000 Units Intravenous Q T,Th,Sa-HD  . ferric citrate  210 mg Oral TID AC  . fluticasone  2 spray Each Nare Daily  . heparin  5,000 Units Subcutaneous Q8H  . hydrALAZINE  50 mg Oral Q8H  . lidocaine  1 patch Transdermal Q12H  . linagliptin  5 mg Oral Daily  . mupirocin ointment   Nasal BID  . sodium chloride flush  3 mL Intravenous Q12H  . vancomycin variable dose per unstable renal function (pharmacist dosing)   Does not apply See admin instructions  . Vitamin D (Ergocalciferol)  50,000 Units Oral UD    Objective: Vital signs in last 24 hours: Temp:  [97.5 F (36.4 C)-98.8 F (37.1 C)] 98.4 F (36.9 C) (12/08 0740) Pulse Rate:  [70-81] 80 (12/08 0740) Resp:  [10-22] 17 (12/08 0740) BP: (102-208)/(62-107) 190/87 (12/08 0740) SpO2:  [98 %-100 %] 100 % (12/08 0740) Weight:  [75.7 kg-77.1 kg] 75.7 kg (12/08 0500)  PHYSICAL EXAM:  General: Alert, cooperative, no distress, appears stated age.  Head: Normocephalic, without obvious abnormality, atraumatic. Eyes: Conjunctivae clear, anicteric sclerae. Pupils are equal ENT Nares normal. No drainage or sinus tenderness. Lips, mucosa, and tongue normal. No Thrush Neck: Supple, symmetrical, no adenopathy, thyroid: non tender no carotid bruit and no JVD. Back: No CVA tenderness. Lungs: Clear to auscultation bilaterally. No Wheezing or Rhonchi. No  rales. Heart: Regular rate and rhythm, no murmur, rub or gallop. Abdomen: Soft, non-tender,not distended. Bowel sounds normal. No masses Extremities: atraumatic, no cyanosis. No edema. No clubbing Skin: eczematous rash over left side of chest Rt chest permacath Lymph: Cervical, supraclavicular normal. Neurologic: Grossly non-focal  Lab Results Recent Labs    12/09/19 0832  WBC 9.3  HGB 8.4*  HCT 24.8*  NA 128*  K 5.2*  CL 93*  CO2 17*  BUN 113*  CREATININE 12.09*    Microbiology: MRSA cath tip Blood culture 12/09/19 and 12/21 BC- NG    Assessment/Plan: Acute resp failure with hypoxia on presentation due to malignant HTN /CHF ? COPD exacerbation Received lasix, solumedrol, duoneb- resolved  Fever after hospitalization on 12/2 during dialysis- no blood culture was sent during that fever ( but one done on admission was neg ) . Dialysis cath was removed and the cath tip culture is MRSA He was started on vanco after fever  repeat culture neg   No TEE is needed HE will need minimum 4 weeks of IV vanco 01/03/20 which can be  given during dialysis- repeat Blood culture 10 days after completion of antibiotic  Allergic dermatitis to the dressing leading to excoriations leading to  Infection Recurrent bacteremia ( had one in May 2021) Need hypoallerginic dressing   Uncontrolled HTn- got hydralazine today On carvedilol, metoprolol  ESRD on dialysis  H/O staph lugdunensis bacteremia with negative  TEE and neg MRI c/spine I May 2021 treated ( catheter was infected and also had eczematous rash at the cath site)  Prostate ca- surveillance-- followed  by urologist  Discussed the management with patient and nephrologist ID will sign off- call if needed

## 2019-12-11 NOTE — Progress Notes (Signed)
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 To discharging provider:  please sign these orders via discharge navigator,  Select New Orders & click on the button choice - Manage This Unsigned Work.     Thank you for allowing pharmacy to be a part of this patient's care.  Doreene Eland, PharmD, BCPS.   Work Cell: [phone] 12/11/2019 11:38 AM

## 2019-12-14 LAB — CULTURE, BLOOD (ROUTINE X 2)
Culture: NO GROWTH
Culture: NO GROWTH
Special Requests: ADEQUATE
Special Requests: ADEQUATE

## 2019-12-19 ENCOUNTER — Other Ambulatory Visit: Payer: Self-pay | Admitting: Family Medicine

## 2019-12-19 DIAGNOSIS — E079 Disorder of thyroid, unspecified: Secondary | ICD-10-CM

## 2019-12-20 ENCOUNTER — Telehealth: Payer: Self-pay

## 2019-12-20 NOTE — Telephone Encounter (Signed)
-----   Message from Hollice Espy, MD sent at 12/19/2019 12:11 PM EST ----- Please reach out to this patient and reschedule his follow-up.  He is missed a few follow-up appointments due to medical illness and hospitalizations.  His PSA continues to rise any is abnormal prostate MRI that needs to be reviewed/discussed.  Hollice Espy, MD

## 2019-12-24 ENCOUNTER — Telehealth: Payer: Self-pay | Admitting: *Deleted

## 2019-12-24 NOTE — Telephone Encounter (Addendum)
Patient informed, voiced understanding. Follow up scheduled.   ----- Message from Hollice Espy, MD sent at 12/19/2019 12:11 PM EST ----- Please reach out to this patient and reschedule his follow-up.  He is missed a few follow-up appointments due to medical illness and hospitalizations.  His PSA continues to rise any is abnormal prostate MRI that needs to be reviewed/discussed.  Hollice Espy, MD

## 2019-12-25 IMAGING — CR DG CHEST 2V
2 series · 2 of 2 positions shown · non-contrast
Comparison: Radiograph January 06, 2018.

CLINICAL DATA: Dyspnea.

EXAM:
CHEST - 2 VIEW

[chest pa]
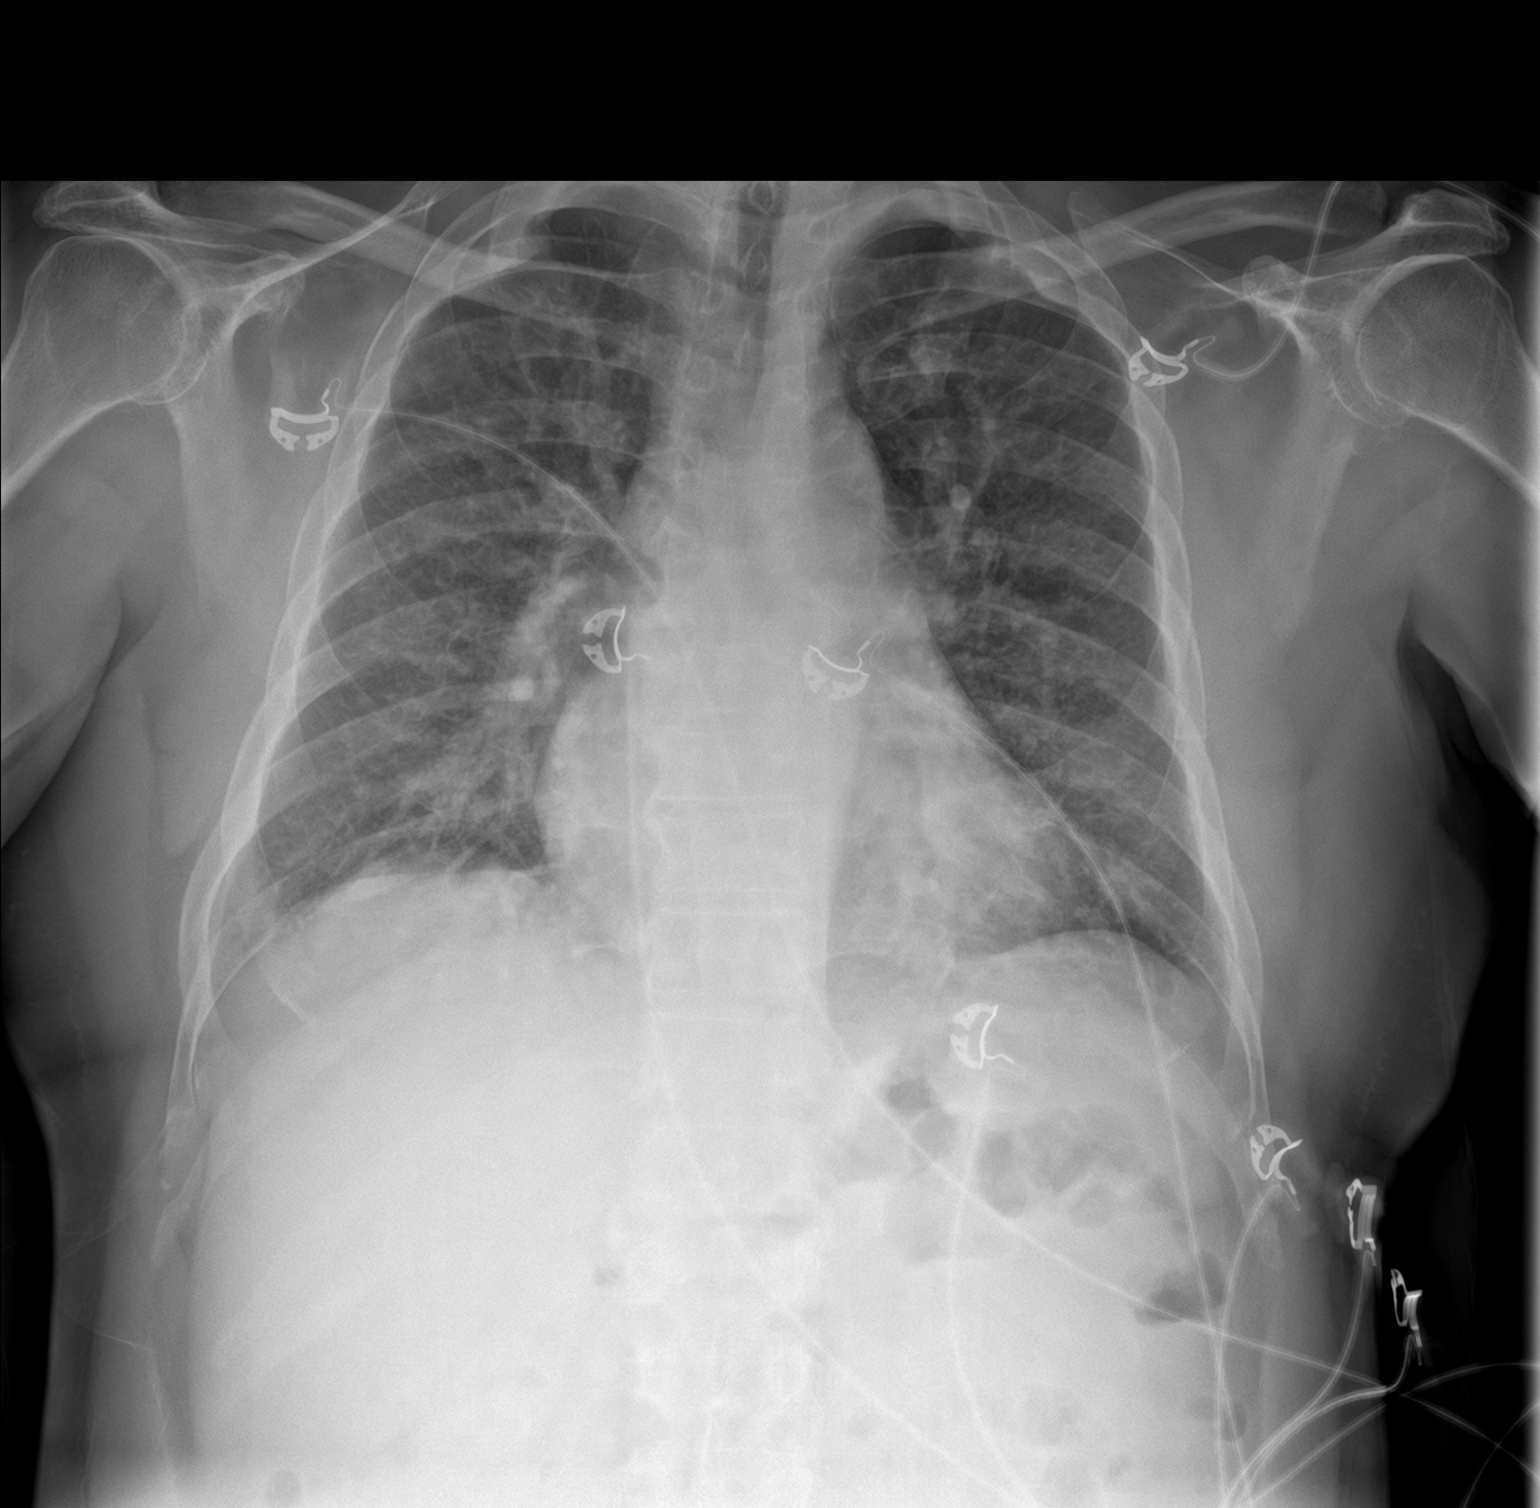

[chest lat]
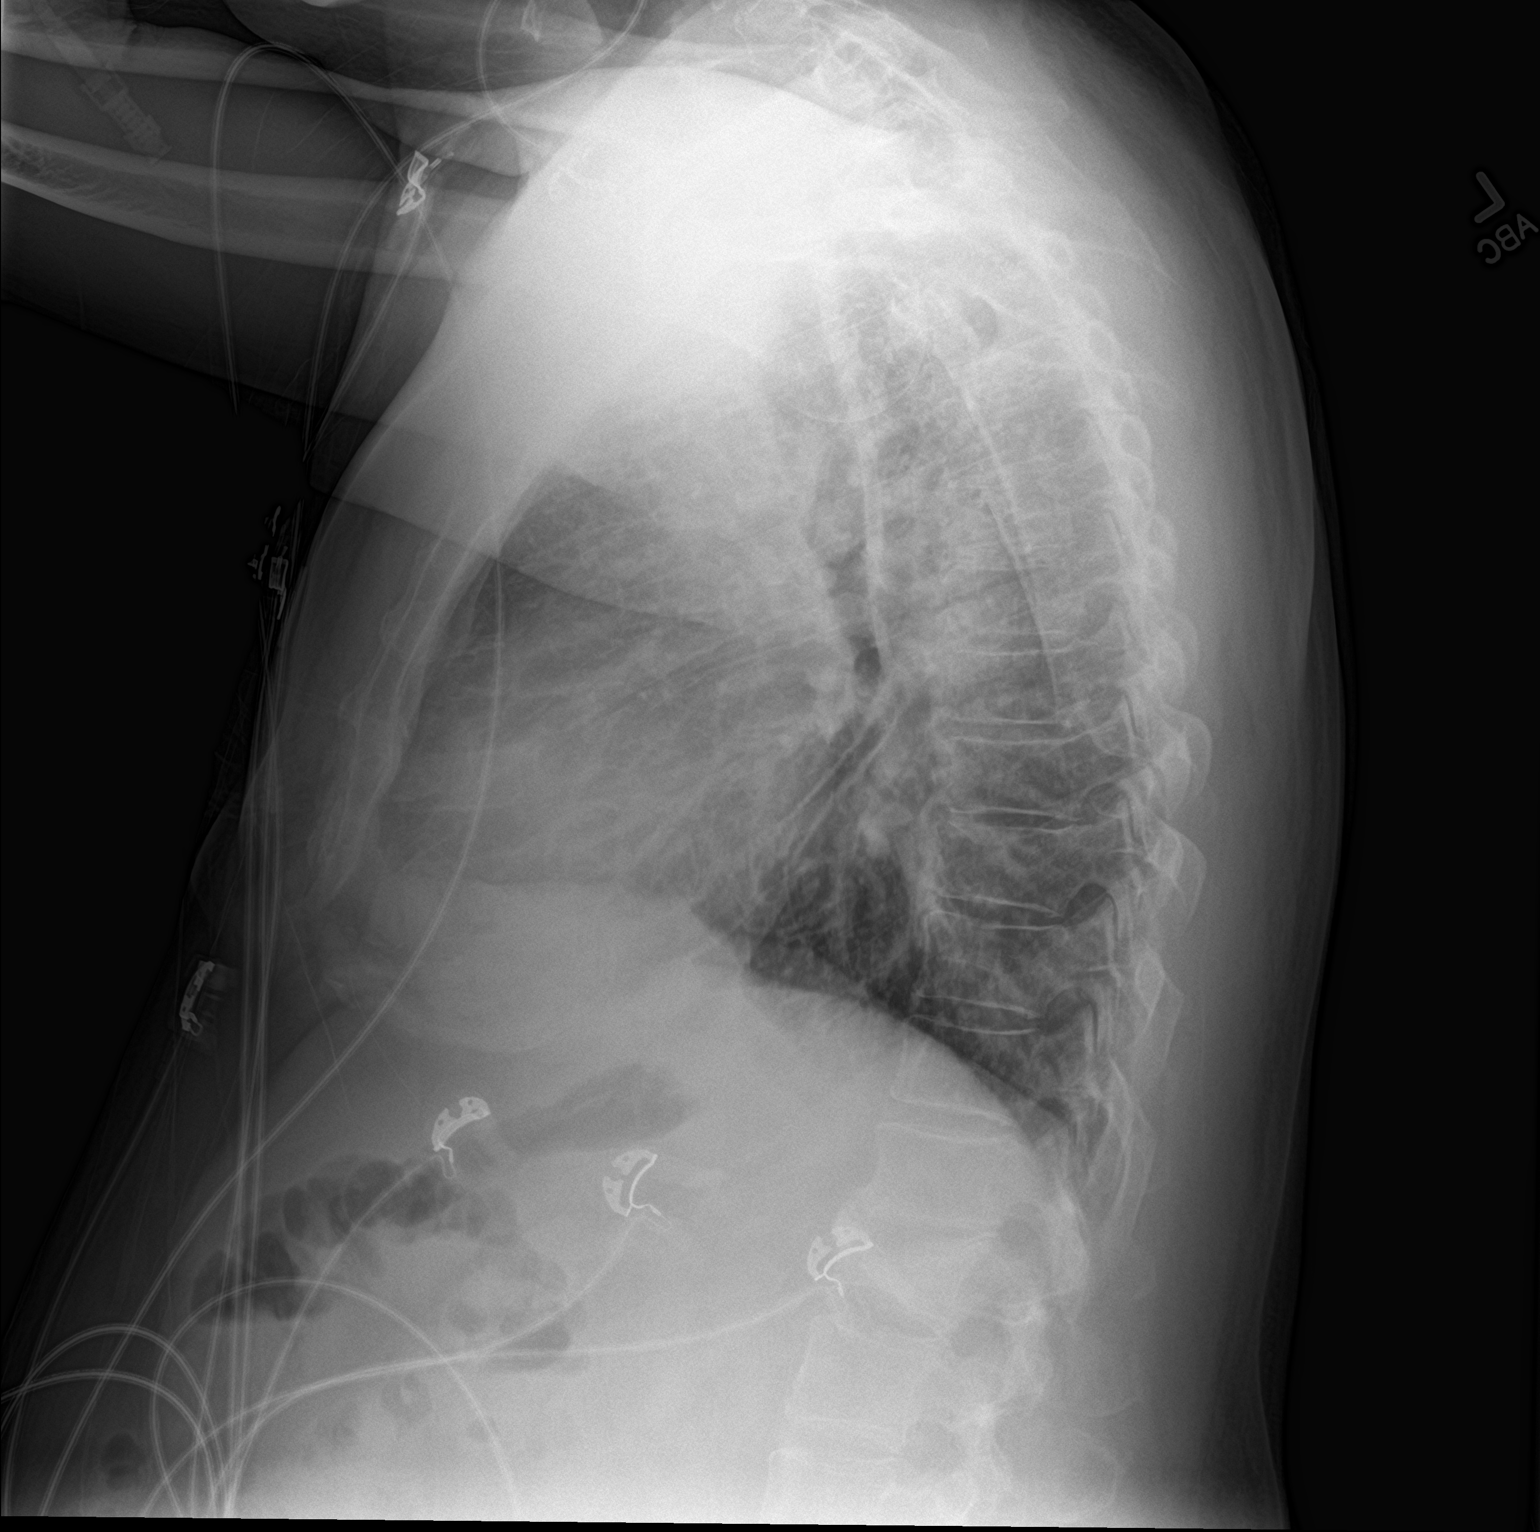

[2 of 2 positions shown; findings below may reference images not displayed]

FINDINGS: Stable cardiomediastinal silhouette. No pneumothorax or pleural
effusion is noted. Central pulmonary vascular congestion is noted.
No consolidative process is noted. Bony thorax is unremarkable.
IMPRESSION: Stable enlargement of cardiomediastinal silhouette with central
pulmonary vascular congestion. Minimal pulmonary edema can not be
excluded.

## 2019-12-28 IMAGING — US US RENAL
1 series · 14 of 25 positions shown · non-contrast
Comparison: No recent prior.

CLINICAL DATA: Acute renal failure.

EXAM:
RENAL / URINARY TRACT ULTRASOUND COMPLETE

[Series 1: us renal · 14 of 77 slices shown]
[im 1/77]
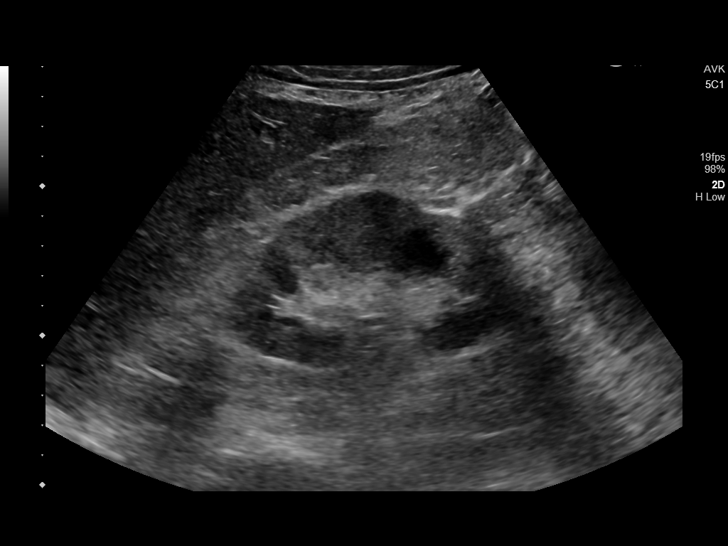
[im 7/77]
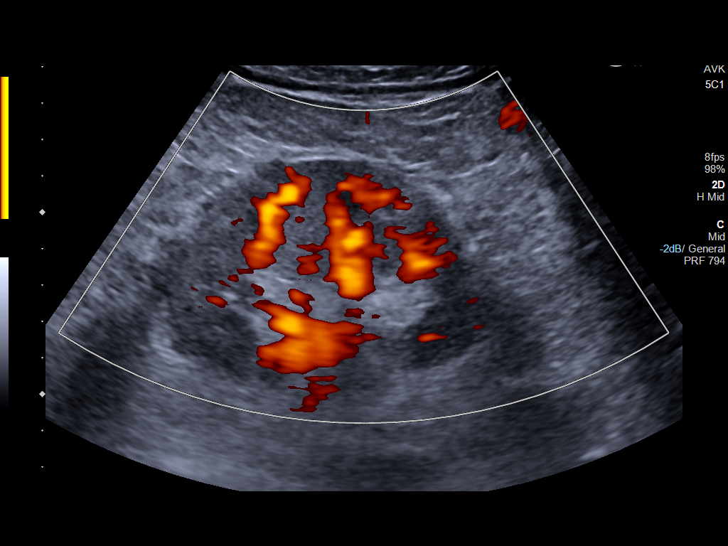
[im 13/77]
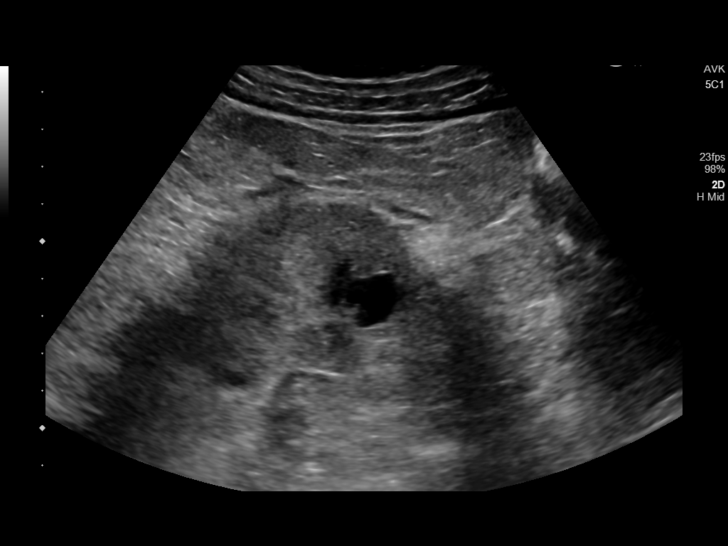
[im 20/77]
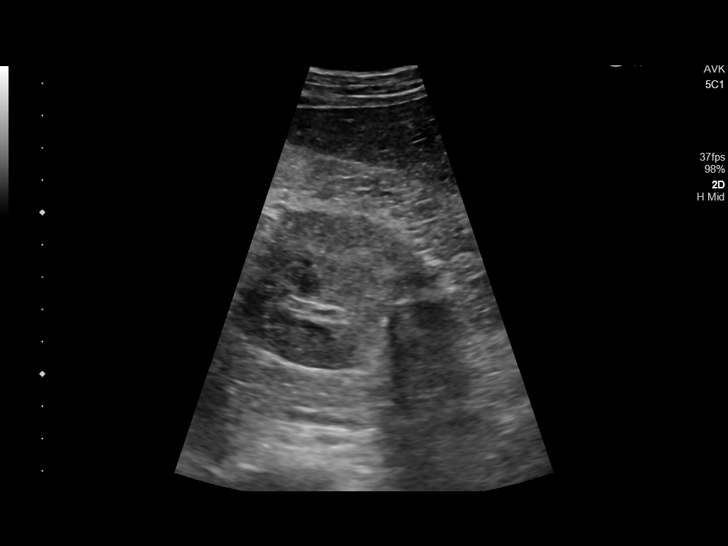
[im 26/77]
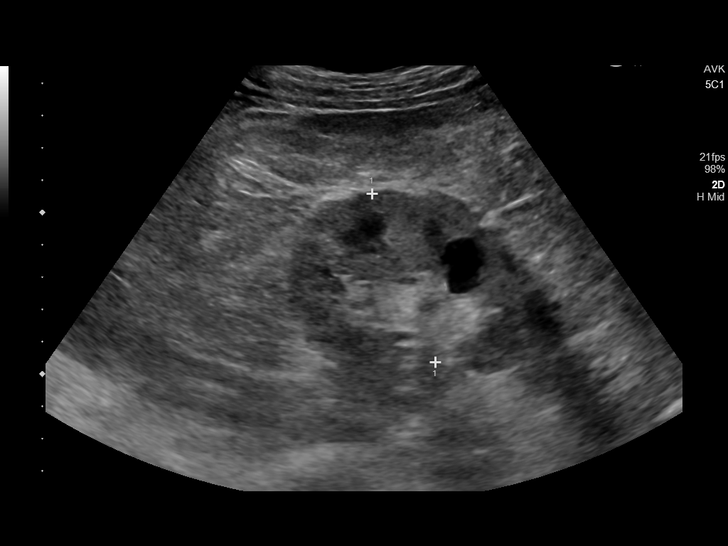
[im 29/77]
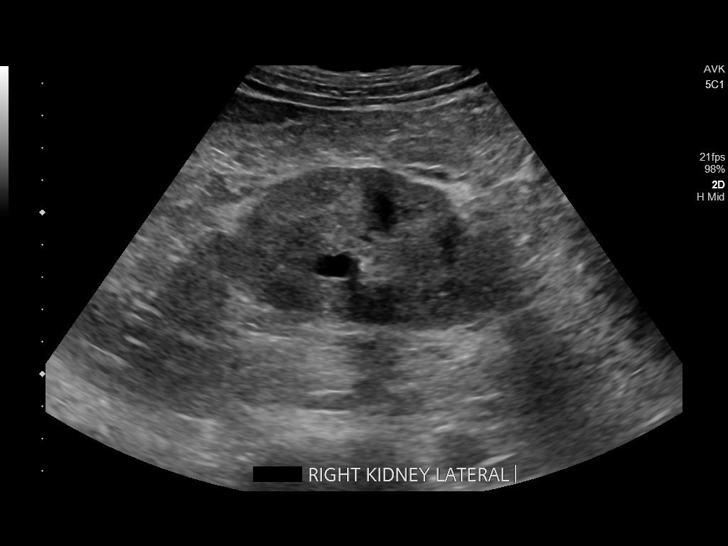
[im 35/77]
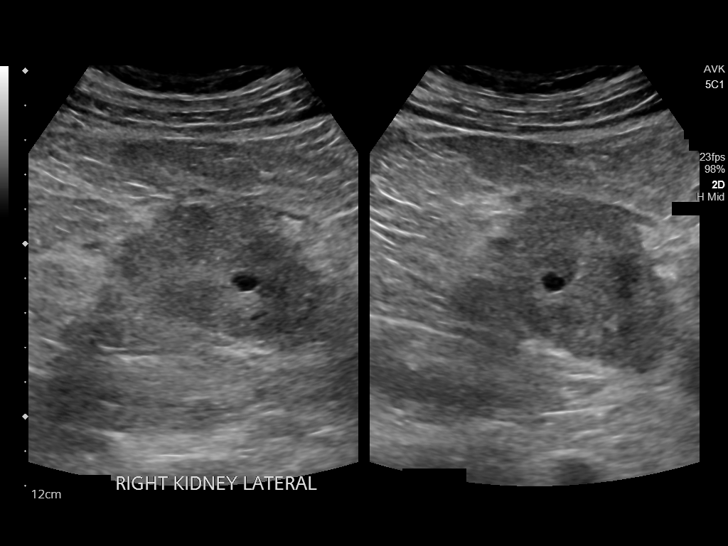
[im 42/77]
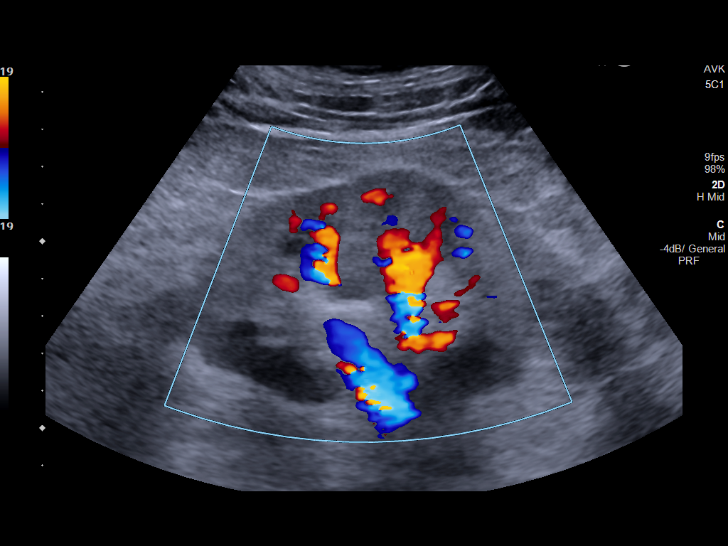
[im 48/77]
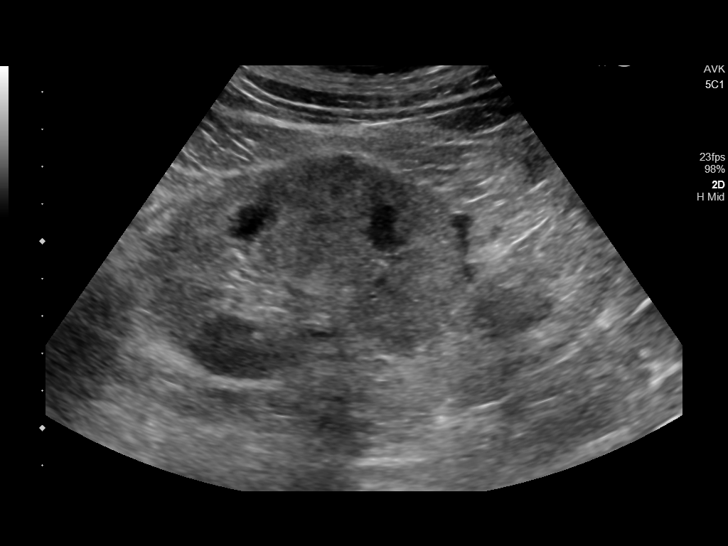
[im 51/77]
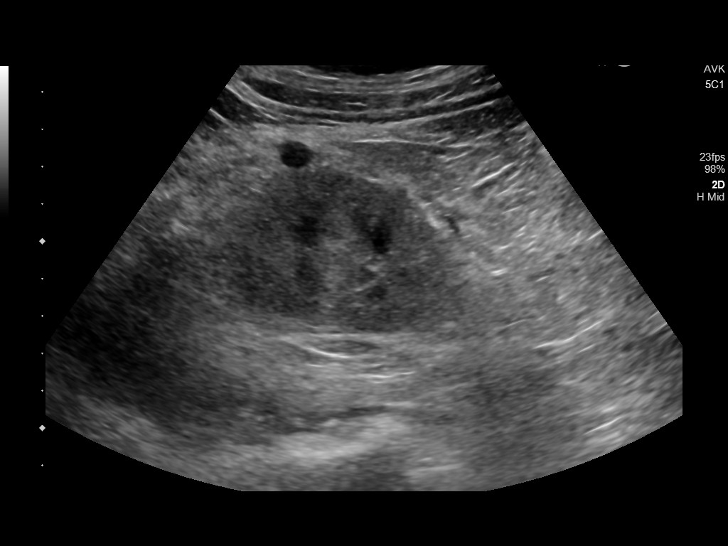
[im 58/77]
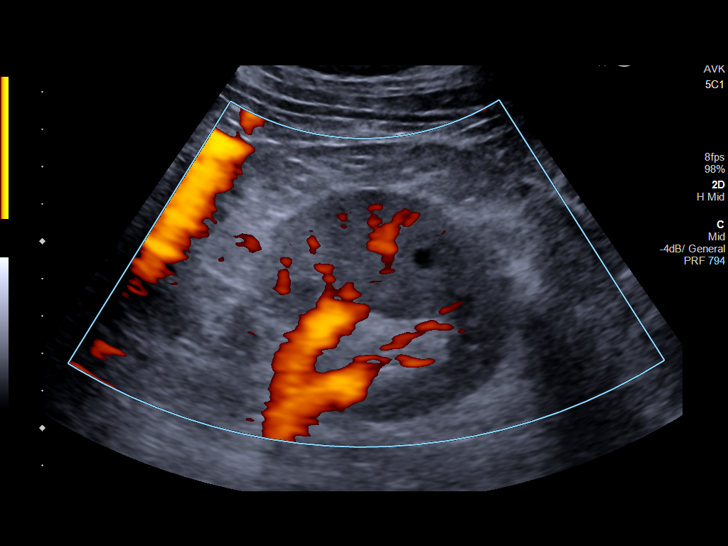
[im 64/77]
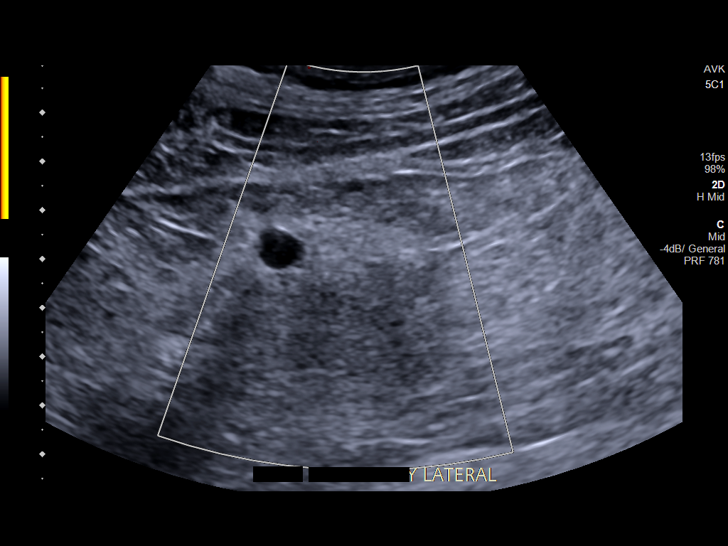
[im 70/77]
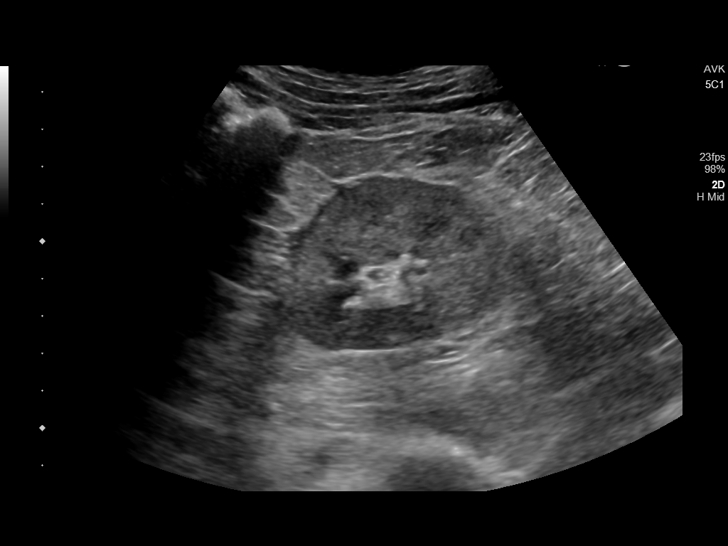
[im 77/77]
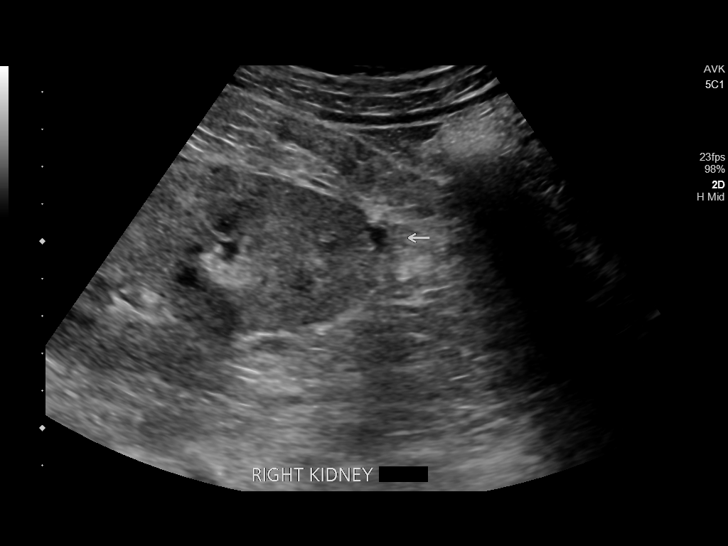

[14 of 25 positions shown; findings below may reference images not displayed]

FINDINGS: Right Kidney:

Renal measurements: 9.9 x 5.8 x 5.6 cm. = volume: 167.2 mL.
Increased echogenicity suggesting chronic medical renal disease.. No
hydronephrosis. 1.4 cm and 0.8 cm simple cysts. Trace amount of
fluid noted about the right kidney.

Left Kidney:

Renal measurements: 9.2 x 6.3 x 5.7 cm = volume: 172.3 mL. Increased
echogenicity suggesting chronic medical renal disease.. No
hydronephrosis. 1.6 cm and 1.1 cm simple cysts. Trace amount of
fluid noted about the left kidney.

Bladder:

Appears normal for degree of bladder distention.
IMPRESSION: 1.  Bilateral simple renal cysts.

2. Increased echogenicity both kidneys consistent with chronic
medical renal disease. Trace amount of fluid noted about the
kidneys. No hydronephrosis or bladder distention.

## 2019-12-31 ENCOUNTER — Ambulatory Visit: Payer: Medicare Other | Attending: Infectious Diseases | Admitting: Infectious Diseases

## 2019-12-31 DIAGNOSIS — A4902 Methicillin resistant Staphylococcus aureus infection, unspecified site: Secondary | ICD-10-CM

## 2019-12-31 DIAGNOSIS — N186 End stage renal disease: Secondary | ICD-10-CM | POA: Diagnosis not present

## 2019-12-31 DIAGNOSIS — Z992 Dependence on renal dialysis: Secondary | ICD-10-CM | POA: Diagnosis not present

## 2019-12-31 NOTE — Progress Notes (Signed)
The purpose of this virtual visit is to provide medical care while limiting exposure to the novel coronavirus (COVID19) for both patient and office staff.   Consent was obtained for phone visit:  Yes.   Answered questions that patient had about telehealth interaction:  Yes.   I discussed the limitations, risks, security and privacy concerns of performing an evaluation and management service by telephone. I also discussed with the patient that there may be a patient responsible charge related to this service. The patient expressed understanding and agreed to proceed.   Patient Location: In his car Provider Location: Office Patient Braelynn Lupton, his brother Miachel Nardelli, and Dr. Levester Fresh were in this phone call. Patient was in the car with his brother who was driving. He was returning from dialysis. This is a follow-up to his recent hospitalization between 12-21 until 12/12/2019 for acute onset of respiratory distress. He was admitted to the hospital for acute hypoxic respiratory failure secondary to COPD exacerbation and acute on chronic diastolic CHF and fluid overload from end-stage renal disease. Blood pressure was also severely elevated consistent with hypertensive emergency. He was treated with IV Lasix and IV nitroglycerin infusion. He did not have any fever on admission but on the day while he was getting dialysis he had developed a temperature of 103. The left IJ permacath was removed and the culture of the catheter tip had moderate MRSA. Blood culture from 12/05/2019 was negative. But no culture was sent when he spiked a fever. So it was decided to treat him as an endovascular infection with 4 weeks of IV vancomycin given during dialysis. Patient recently had staph lugdunensis bacteremia in May 2021 and he was treated with 4 weeks of IV cefazolin during dialysis. During that hospitalization it was found that he had an eczematous reaction on his skin at the site of the dialysis dressing which  possibly was the source for his infection. During this hospitalization it was emphasized to the dialysis team that he should get an known allergen dressing without the tape. Patient says his skin on the dialysis site is doing well. Brother noticed that it was just covered with a transparent dressing and gauze without any CHG. Biopatch. Plan is to continue vancomycin until 01/03/2020. Following which he will get a blood culture in 10 days of antibiotic. T this plan had already been communicated with the nephrologist while he was inpatient. Discussed the management in great detail with the patient and his brother. Answered all questions. Total time  spent on the phone call 7 minutes.

## 2020-01-01 ENCOUNTER — Ambulatory Visit: Payer: Medicare Other

## 2020-01-10 ENCOUNTER — Ambulatory Visit
Admission: RE | Admit: 2020-01-10 | Discharge: 2020-01-10 | Disposition: A | Discharge status: Home / Self Care | Payer: Medicare Other | Source: Ambulatory Visit | Attending: Family Medicine | Admitting: Family Medicine

## 2020-01-10 ENCOUNTER — Other Ambulatory Visit: Payer: Self-pay

## 2020-01-10 DIAGNOSIS — E079 Disorder of thyroid, unspecified: Secondary | ICD-10-CM | POA: Insufficient documentation

## 2020-01-15 ENCOUNTER — Ambulatory Visit (INDEPENDENT_AMBULATORY_CARE_PROVIDER_SITE_OTHER): Payer: Medicare Other | Admitting: Urology

## 2020-01-15 ENCOUNTER — Other Ambulatory Visit: Payer: Self-pay

## 2020-01-15 VITALS — BP 140/80 | HR 70 | Ht 61.0 in | Wt 166.0 lb

## 2020-01-15 DIAGNOSIS — C61 Malignant neoplasm of prostate: Secondary | ICD-10-CM | POA: Diagnosis not present

## 2020-01-15 NOTE — Progress Notes (Signed)
01/15/2020 12:43 PM   Daniel Thomas [birth year] [id number]  Referring provider: Marguerita Merles, [address] [place],  [place] [address]  Chief Complaint  Patient presents with  . Prostate Cancer    HPI: 65 year old male with end-stage renal disease, multiple recent admissions for bacteremia/line infections/ COPD / CHF exacerbations who returns to the office today with a follow-up prostate MRI.  He was first diagnosed with this in 02/2018 at which time his PSA had jumped up to 20 from 10 the previous year. Is also noted to have multiple nodules on rectal exam. Prostate biopsy revealed Gleason 3+3 in 6 of 12 cores, primarily on the left up to 53% of the tissue.  Previously discussed obtaining prostate MRI.  This was delayed more than a year due to inability to get it scheduled, performed and other medical illness/patient factors.  He ultimately had a prostate MRI performed during inpatient admission on 12/09/2019.  This reveals multiple areas within the prostate consistent with PI-RADS 3/4 lesions.  There is no extracapsular extension, obvious seminal vesicle involvement, neurovascular bundle involvement, or pelvic adenopathy.  There was an incidental lesion measuring 1.9 x 1.4 described is a nodule along the posterior right mesorectal fascia and pelvic floor musculature close to the sciatic nerve.  No weight loss or bone pain.  No urinary symptoms, only voids once per day.  No gross hematuria.  Component     Latest Ref Rng & Units 09/22/2017 08/27/2018 06/24/2019 10/25/2019  Prostate Specific Ag, Serum     0.0 - 4.0 ng/mL 10.0 (H) 13.4 (H) 16.7 (H) 20.2 (H)       PMH: Past Medical History:  Diagnosis Date  . COPD (chronic obstructive pulmonary disease) ([place])   . Diabetes mellitus without complication ([name])   . Enlarged prostate   . ESRD on dialysis ([place])   . Heart murmur   . High cholesterol   . Hypertension     Surgical History: Past Surgical History:   Procedure Laterality Date  . DIALYSIS/PERMA CATHETER INSERTION N/A 01/12/2018   Procedure: DIALYSIS/PERMA CATHETER INSERTION;  Surgeon: Katha Cabal, MD;  Location: [facility];  Service: Cardiovascular;  Laterality: N/A;  . DIALYSIS/PERMA CATHETER INSERTION N/A 05/16/2019   Procedure: DIALYSIS/PERMA CATHETER INSERTION;  Surgeon: Algernon Huxley, MD;  Location: [facility];  Service: Cardiovascular;  Laterality: N/A;  . DIALYSIS/PERMA CATHETER INSERTION N/A 10/17/2019   Procedure: DIALYSIS/PERMA CATHETER INSERTION;  Surgeon: Algernon Huxley, MD;  Location: [facility];  Service: Cardiovascular;  Laterality: N/A;  . DIALYSIS/PERMA CATHETER INSERTION N/A 12/10/2019   Procedure: DIALYSIS/PERMA CATHETER INSERTION;  Surgeon: Katha Cabal, MD;  Location: [facility];  Service: Cardiovascular;  Laterality: N/A;  . DIALYSIS/PERMA CATHETER REMOVAL N/A 05/13/2019   Procedure: DIALYSIS/PERMA CATHETER REMOVAL;  Surgeon: Algernon Huxley, MD;  Location: [facility];  Service: Cardiovascular;  Laterality: N/A;  . DIALYSIS/PERMA CATHETER REMOVAL N/A 12/06/2019   Procedure: DIALYSIS/PERMA CATHETER REMOVAL;  Surgeon: Algernon Huxley, MD;  Location: [facility];  Service: Cardiovascular;  Laterality: N/A;  . TEE WITHOUT CARDIOVERSION N/A 05/15/2019   Procedure: TRANSESOPHAGEAL ECHOCARDIOGRAM (TEE);  Surgeon: Teodoro Spray, MD;  Location: [facility];  Service: Cardiovascular;  Laterality: N/A;    Home Medications:  Allergies as of 01/15/2020      Reactions   Tape Dermatitis, Rash      Medication List       Accurate as of January 15, 2020  12:43 PM. If you have any questions, ask your nurse or doctor.        STOP taking these medications   predniSONE 20 MG tablet Commonly known as: DELTASONE Stopped by: Hollice Espy, MD     TAKE these medications   albuterol 108 (90 Base) MCG/ACT inhaler Commonly known as: VENTOLIN HFA Inhale 2 puffs into the lungs  every 6 (six) hours as needed for wheezing or shortness of breath.   amLODipine 10 MG tablet Commonly known as: NORVASC Take 1 tablet (10 mg total) by mouth at bedtime.   atorvastatin 40 MG tablet Commonly known as: LIPITOR Take 40 mg by mouth at bedtime.   calcium carbonate 500 MG chewable tablet Commonly known as: TUMS - dosed in mg elemental calcium Chew 1 tablet by mouth every 6 (six) hours as needed for indigestion.   carvedilol 12.5 MG tablet Commonly known as: COREG Take 12.5 mg by mouth 2 (two) times daily with a meal.   citalopram 20 MG tablet Commonly known as: CELEXA Take 20 mg by mouth daily.   ergocalciferol 1.25 MG (50000 UT) capsule Commonly known as: VITAMIN D2 Take by mouth. Administered at analysis   ferric citrate 1 GM 210 MG(Fe) tablet Commonly known as: AURYXIA Take by mouth.   hydrALAZINE 25 MG tablet Commonly known as: APRESOLINE Take 1 tablet (25 mg total) by mouth every 8 (eight) hours.   lidocaine 5 % Commonly known as: Lidoderm Place 1 patch onto the skin every 12 (twelve) hours. Remove & Discard patch within 12 hours or as directed by MD   linagliptin 5 MG Tabs tablet Commonly known as: TRADJENTA Take 1 tablet (5 mg total) by mouth daily.   losartan 50 MG tablet Commonly known as: COZAAR Take 1 tablet (50 mg total) by mouth at bedtime.   meloxicam 7.5 MG tablet Commonly known as: MOBIC Take 7.5 mg by mouth daily.   MIRCERA IJ Mircera   sevelamer carbonate 800 MG tablet Commonly known as: RENVELA Take 2 tablet by mouth three times a day  with each meal   tiZANidine 2 MG tablet Commonly known as: ZANAFLEX Take 2 mg by mouth 3 (three) times daily as needed.   traZODone 50 MG tablet Commonly known as: DESYREL Take 50 mg by mouth at bedtime as needed for sleep.       Allergies:  Allergies  Allergen Reactions  . Tape Dermatitis and Rash    Family History: No family history on file.  Social History:  reports that he has  quit smoking. He quit after 20.00 years of use. He has never used smokeless tobacco. He reports that he does not drink alcohol and does not use drugs.   Physical Exam: BP 140/80   Pulse 70   Ht 5\' 1"  (1.549 m)   Wt 166 lb (75.3 kg)   BMI 31.37 kg/m   Constitutional:  Alert and oriented, No acute distress.  Accompanied by brother today. HEENT: [place] AT, moist mucus membranes.  Trachea midline, no masses. Cardiovascular: No clubbing, cyanosis, or edema. Respiratory: Normal respiratory effort, no increased work of breathing. Skin: No rashes, bruises or suspicious lesions. Neurologic: Grossly intact, no focal deficits, moving all 4 extremities. Psychiatric: Normal mood and affect.  Laboratory Data: Lab Results  Component Value Date   WBC 9.3 12/09/2019   HGB 8.4 (L) 12/09/2019   HCT 24.8 (L) 12/09/2019   MCV 90.2 12/09/2019   PLT 183 12/09/2019    Lab Results  Component Value Date  CREATININE 12.09 (H) 12/09/2019    No results found for: PSA  No results found for: TESTOSTERONE  Lab Results  Component Value Date   HGBA1C 6.2 (H) 12/06/2019    Urinalysis    Component Value Date/Time   COLORURINE YELLOW (A) 11/10/2019 1702   APPEARANCEUR CLOUDY (A) 11/10/2019 1702   LABSPEC 1.006 11/10/2019 1702   PHURINE 6.0 11/10/2019 1702   GLUCOSEU NEGATIVE 11/10/2019 1702   HGBUR MODERATE (A) 11/10/2019 1702   BILIRUBINUR NEGATIVE 11/10/2019 1702   KETONESUR NEGATIVE 11/10/2019 1702   PROTEINUR 100 (A) 11/10/2019 1702   NITRITE NEGATIVE 11/10/2019 1702   LEUKOCYTESUR LARGE (A) 11/10/2019 1702    Lab Results  Component Value Date   BACTERIA RARE (A) 11/10/2019    Pertinent Imaging: CT Renal Stone Study  Narrative CLINICAL DATA:  Lower back pain, flank pain  EXAM: CT ABDOMEN AND PELVIS WITHOUT CONTRAST  TECHNIQUE: Multidetector CT imaging of the abdomen and pelvis was performed following the standard protocol without IV contrast.  COMPARISON:   11/10/2019  FINDINGS: Lower chest: No acute pleural or parenchymal lung disease.  Hepatobiliary: Calcified gallstone is identified within the gallbladder fundus. No evidence of acute cholecystitis. The liver is unremarkable.  Pancreas: Unremarkable. No pancreatic ductal dilatation or surrounding inflammatory changes.  Spleen: Normal in size without focal abnormality.  Adrenals/Urinary Tract: Calcifications at the bilateral renal hila are felt to be vascular. No definite urinary tract calculi or obstructive uropathy within either kidney. The adrenals and bladder are grossly normal.  Stomach/Bowel: No bowel obstruction or ileus. Normal appendix right lower quadrant. No bowel wall thickening or inflammatory change.  Vascular/Lymphatic: Aortic atherosclerosis. No enlarged abdominal or pelvic lymph nodes.  Reproductive: Prostate is enlarged measuring 4.8 x 5.5 cm.  Other: No free fluid or free gas. Bilateral fat containing inguinal hernias, left greater than right.  Musculoskeletal: No acute or destructive bony lesions. Chronic L1 compression deformity. Reconstructed images demonstrate no additional findings.  IMPRESSION: 1. Small calcifications of the bilateral renal hila are likely vascular given the degree of atherosclerosis within the aorta and its branches. No evidence of urinary tract calculi or obstruction. 2. Cholelithiasis without cholecystitis. 3. Bilateral fat containing inguinal hernias.  No bowel herniation. 4. Enlarged prostate. 5.  Aortic Atherosclerosis (ICD10-I70.0).   Electronically Signed By: Randa Ngo M.D. On: 11/10/2019 16:22   IMPRESSION: 1. PIRADS category 4 lesion in the LEFT apical peripheral zone. 2. PIRADS category 3 lesion in the LEFT mid to apical transitional zone. 3. Nodule in the RIGHT posterior extraperitoneal pelvis adjacent to the mesorectal fascia and pelvic floor musculature. Findings could represent a metastatic lesion or  finding such as nerve sheath tumor though this appears to be removed from the sciatic nerve. Comparison with more remote prior imaging if available could be helpful for further evaluation. 4. Moderate-sized bilateral fat containing inguinal hernias LEFT greater than RIGHT. 5. Small umbilical hernia.  Electronically Signed: By: Zetta Bills M.D. On: 12/09/2019 08:02  MRI was personally reviewed today along with CT renal stone protocol from 11/2019.  Assessment & Plan:    1. Prostate cancer [facility]) Lengthy discussion today with both the patient and his brother  He does have low risk prostate cancer with a slowly rising PSA.  MRI is reassuring for no obvious evidence of locally advanced or metastatic disease.  We discussed his overall prognosis today along with his additional severe medical comorbidities.  His life expectancy is likely less than 5 years thus as such, treatment for this  prostate cancer whether it is a low risk for more aggressive is likely not clinically meaningful and/or significant.  Extraprostatic lesion in the pelvis likely represents unknown urologic benign tumor, very atypical for prostate cancer.  We will plan to continue to follow his PSA.  We will recheck today.  If his PSA continues to rise, may consider PSMA PET scan for further staging and also to help further characterize this atypical MRI finding.  Return in 6 months with PSA, will call with PSA results and further recommendations based on these results - PSA; Future - PSA   Hollice Espy, MD  [facility] [address], [place], [place] [address] [phone]  I spent 30 total minutes on the day of the encounter including pre-visit review of the medical record, face-to-face time with the patient, and post visit ordering of labs/imaging/tests.

## 2020-01-16 LAB — PSA: Prostate Specific Ag, Serum: 19.4 ng/mL — ABNORMAL HIGH (ref 0.0–4.0)

## 2020-01-17 ENCOUNTER — Telehealth: Payer: Self-pay | Admitting: *Deleted

## 2020-01-17 ENCOUNTER — Other Ambulatory Visit: Payer: Self-pay | Admitting: Urology

## 2020-01-17 NOTE — Telephone Encounter (Addendum)
Patient and brother informed, voiced understanding.   ----- Message from Hollice Espy, MD sent at 01/17/2020  1:46 PM EST ----- Please let this patient and his brother (on the DPR) know that the PSA is stable and actually has come down a little bit to 19.  This is good news.  Plan to repeat in 6 months as discussed.  I am also planning to present the patient at tumor board discussion next week to get other opinions about this other nodule in his pelvis.  I will get back to him if there is anything else recommended beyond what was discussed in clinic.  Hollice Espy, MD

## 2020-04-28 ENCOUNTER — Observation Stay (HOSPITAL_COMMUNITY)
Admission: EM | Admit: 2020-04-28 | Discharge: 2020-04-29 | Disposition: A | Discharge status: Home / Self Care | Payer: Medicare Other | Attending: Internal Medicine | Admitting: Internal Medicine

## 2020-04-28 ENCOUNTER — Encounter (HOSPITAL_COMMUNITY): Payer: Self-pay | Admitting: Pharmacy Technician

## 2020-04-28 ENCOUNTER — Emergency Department (HOSPITAL_COMMUNITY): Payer: Medicare Other

## 2020-04-28 DIAGNOSIS — Y92410 Unspecified street and highway as the place of occurrence of the external cause: Secondary | ICD-10-CM | POA: Insufficient documentation

## 2020-04-28 DIAGNOSIS — S065X0A Traumatic subdural hemorrhage without loss of consciousness, initial encounter: Secondary | ICD-10-CM | POA: Diagnosis not present

## 2020-04-28 DIAGNOSIS — S065X9A Traumatic subdural hemorrhage with loss of consciousness of unspecified duration, initial encounter: Secondary | ICD-10-CM

## 2020-04-28 DIAGNOSIS — Z79899 Other long term (current) drug therapy: Secondary | ICD-10-CM | POA: Insufficient documentation

## 2020-04-28 DIAGNOSIS — Z87891 Personal history of nicotine dependence: Secondary | ICD-10-CM | POA: Insufficient documentation

## 2020-04-28 DIAGNOSIS — E1122 Type 2 diabetes mellitus with diabetic chronic kidney disease: Secondary | ICD-10-CM | POA: Diagnosis not present

## 2020-04-28 DIAGNOSIS — S065XAA Traumatic subdural hemorrhage with loss of consciousness status unknown, initial encounter: Secondary | ICD-10-CM

## 2020-04-28 DIAGNOSIS — Z20822 Contact with and (suspected) exposure to covid-19: Secondary | ICD-10-CM | POA: Diagnosis not present

## 2020-04-28 DIAGNOSIS — N186 End stage renal disease: Secondary | ICD-10-CM | POA: Diagnosis present

## 2020-04-28 DIAGNOSIS — Z992 Dependence on renal dialysis: Secondary | ICD-10-CM | POA: Insufficient documentation

## 2020-04-28 DIAGNOSIS — I160 Hypertensive urgency: Secondary | ICD-10-CM

## 2020-04-28 DIAGNOSIS — E1129 Type 2 diabetes mellitus with other diabetic kidney complication: Secondary | ICD-10-CM

## 2020-04-28 DIAGNOSIS — I132 Hypertensive heart and chronic kidney disease with heart failure and with stage 5 chronic kidney disease, or end stage renal disease: Secondary | ICD-10-CM | POA: Diagnosis not present

## 2020-04-28 DIAGNOSIS — I5033 Acute on chronic diastolic (congestive) heart failure: Secondary | ICD-10-CM | POA: Diagnosis not present

## 2020-04-28 DIAGNOSIS — Z7984 Long term (current) use of oral hypoglycemic drugs: Secondary | ICD-10-CM | POA: Diagnosis not present

## 2020-04-28 DIAGNOSIS — J449 Chronic obstructive pulmonary disease, unspecified: Secondary | ICD-10-CM | POA: Diagnosis not present

## 2020-04-28 DIAGNOSIS — S0990XA Unspecified injury of head, initial encounter: Secondary | ICD-10-CM | POA: Diagnosis present

## 2020-04-28 LAB — COMPREHENSIVE METABOLIC PANEL
ALT: 17 U/L (ref 0–44)
AST: 22 U/L (ref 15–41)
Albumin: 4 g/dL (ref 3.5–5.0)
Alkaline Phosphatase: 116 U/L (ref 38–126)
Anion gap: 14 (ref 5–15)
BUN: 19 mg/dL (ref 8–23)
CO2: 29 mmol/L (ref 22–32)
Calcium: 8.8 mg/dL — ABNORMAL LOW (ref 8.9–10.3)
Chloride: 92 mmol/L — ABNORMAL LOW (ref 98–111)
Creatinine, Ser: 5.58 mg/dL — ABNORMAL HIGH (ref 0.61–1.24)
GFR, Estimated: 11 mL/min — ABNORMAL LOW (ref >60)
Glucose, Bld: 84 mg/dL (ref 70–99)
Potassium: 4.1 mmol/L (ref 3.5–5.1)
Sodium: 135 mmol/L (ref 135–145)
Total Bilirubin: 0.7 mg/dL (ref 0.3–1.2)
Total Protein: 7.9 g/dL (ref 6.5–8.1)

## 2020-04-28 LAB — CBC WITH DIFFERENTIAL/PLATELET
Abs Immature Granulocytes: 0.02 10*3/uL (ref 0.00–0.07)
Basophils Absolute: 0.1 10*3/uL (ref 0.0–0.1)
Basophils Relative: 1 %
Eosinophils Absolute: 0.4 10*3/uL (ref 0.0–0.5)
Eosinophils Relative: 5 %
HCT: 37 % — ABNORMAL LOW (ref 39.0–52.0)
Hemoglobin: 12 g/dL — ABNORMAL LOW (ref 13.0–17.0)
Immature Granulocytes: 0 %
Lymphocytes Relative: 29 %
Lymphs Abs: 2.1 10*3/uL (ref 0.7–4.0)
MCH: 28.5 pg (ref 26.0–34.0)
MCHC: 32.4 g/dL (ref 30.0–36.0)
MCV: 87.9 fL (ref 80.0–100.0)
Monocytes Absolute: 0.9 10*3/uL (ref 0.1–1.0)
Monocytes Relative: 12 %
Neutro Abs: 4 10*3/uL (ref 1.7–7.7)
Neutrophils Relative %: 53 %
Platelets: 288 10*3/uL (ref 150–400)
RBC: 4.21 MIL/uL — ABNORMAL LOW (ref 4.22–5.81)
RDW: 15.5 % (ref 11.5–15.5)
WBC: 7.5 10*3/uL (ref 4.0–10.5)
nRBC: 0 % (ref 0.0–0.2)

## 2020-04-28 LAB — RESP PANEL BY RT-PCR (FLU A&B, COVID) ARPGX2
Influenza A by PCR: NEGATIVE
Influenza B by PCR: NEGATIVE
SARS Coronavirus 2 by RT PCR: NEGATIVE

## 2020-04-28 MED ORDER — SEVELAMER CARBONATE 800 MG PO TABS: 800.0000 mg | ORAL_TABLET | Freq: Three times a day (TID) | ORAL | Status: DC

## 2020-04-28 MED ORDER — ALBUTEROL SULFATE HFA 108 (90 BASE) MCG/ACT IN AERS
2.0000 | INHALATION_SPRAY | Freq: Four times a day (QID) | RESPIRATORY_TRACT | Status: DC | PRN
  Filled 2020-04-28: qty 6.7

## 2020-04-28 MED ORDER — LABETALOL HCL 5 MG/ML IV SOLN
10.0000 mg | Freq: Once | INTRAVENOUS | Status: AC
  Administered 2020-04-28: 10 mg via INTRAVENOUS
  Filled 2020-04-28: qty 4

## 2020-04-28 MED ORDER — SODIUM CHLORIDE 0.9 % IV SOLN: 250.0000 mL | INTRAVENOUS | Status: DC | PRN

## 2020-04-28 MED ORDER — SODIUM CHLORIDE 0.9% FLUSH: 3.0000 mL | INTRAVENOUS | Status: DC | PRN

## 2020-04-28 MED ORDER — HYDRALAZINE HCL 20 MG/ML IJ SOLN
10.0000 mg | Freq: Once | INTRAMUSCULAR | Status: AC
  Administered 2020-04-28: 10 mg via INTRAVENOUS
  Filled 2020-04-28: qty 1

## 2020-04-28 MED ORDER — ACETAMINOPHEN 650 MG RE SUPP: 650.0000 mg | Freq: Four times a day (QID) | RECTAL | Status: DC | PRN

## 2020-04-28 MED ORDER — TRAZODONE HCL 50 MG PO TABS: 50.0000 mg | ORAL_TABLET | Freq: Every evening | ORAL | Status: DC | PRN

## 2020-04-28 MED ORDER — SODIUM CHLORIDE 0.9% FLUSH
3.0000 mL | Freq: Two times a day (BID) | INTRAVENOUS | Status: DC
  Administered 2020-04-28: 3 mL via INTRAVENOUS

## 2020-04-28 MED ORDER — CARVEDILOL 12.5 MG PO TABS
12.5000 mg | ORAL_TABLET | Freq: Two times a day (BID) | ORAL | Status: DC
  Administered 2020-04-29: 12.5 mg via ORAL
  Filled 2020-04-28: qty 1

## 2020-04-28 MED ORDER — ONDANSETRON HCL 4 MG PO TABS: 4.0000 mg | ORAL_TABLET | Freq: Four times a day (QID) | ORAL | Status: DC | PRN

## 2020-04-28 MED ORDER — ATORVASTATIN CALCIUM 40 MG PO TABS
40.0000 mg | ORAL_TABLET | Freq: Every day | ORAL | Status: DC
  Administered 2020-04-29: 40 mg via ORAL
  Filled 2020-04-28: qty 1

## 2020-04-28 MED ORDER — AMLODIPINE BESYLATE 10 MG PO TABS
10.0000 mg | ORAL_TABLET | Freq: Every day | ORAL | Status: DC
  Administered 2020-04-29: 10 mg via ORAL
  Filled 2020-04-28: qty 1

## 2020-04-28 MED ORDER — ACETAMINOPHEN 325 MG PO TABS: 650.0000 mg | ORAL_TABLET | Freq: Four times a day (QID) | ORAL | Status: DC | PRN

## 2020-04-28 MED ORDER — LINAGLIPTIN 5 MG PO TABS
5.0000 mg | ORAL_TABLET | Freq: Every day | ORAL | Status: DC
  Administered 2020-04-29: 5 mg via ORAL
  Filled 2020-04-28: qty 1

## 2020-04-28 MED ORDER — METOPROLOL TARTRATE 5 MG/5ML IV SOLN: 5.0000 mg | Freq: Once | INTRAVENOUS | Status: DC

## 2020-04-28 MED ORDER — HYDRALAZINE HCL 25 MG PO TABS
25.0000 mg | ORAL_TABLET | Freq: Three times a day (TID) | ORAL | Status: DC
  Administered 2020-04-29 (×3): 25 mg via ORAL
  Filled 2020-04-28 (×3): qty 1

## 2020-04-28 MED ORDER — SEVELAMER CARBONATE 800 MG PO TABS
1600.0000 mg | ORAL_TABLET | Freq: Three times a day (TID) | ORAL | Status: DC
  Administered 2020-04-29 (×2): 1600 mg via ORAL
  Filled 2020-04-28 (×2): qty 2

## 2020-04-28 MED ORDER — CITALOPRAM HYDROBROMIDE 20 MG PO TABS
20.0000 mg | ORAL_TABLET | Freq: Every day | ORAL | Status: DC
  Administered 2020-04-29: 20 mg via ORAL
  Filled 2020-04-28: qty 1

## 2020-04-28 MED ORDER — ONDANSETRON HCL 4 MG/2ML IJ SOLN: 4.0000 mg | Freq: Four times a day (QID) | INTRAMUSCULAR | Status: DC | PRN
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
## 2020-04-28 NOTE — ED Notes (Signed)
Attempted to call report x 1  
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
## 2020-04-29 ENCOUNTER — Inpatient Hospital Stay (HOSPITAL_COMMUNITY): Payer: Medicare Other

## 2020-04-29 ENCOUNTER — Other Ambulatory Visit: Payer: Self-pay

## 2020-04-29 DIAGNOSIS — S065X9A Traumatic subdural hemorrhage with loss of consciousness of unspecified duration, initial encounter: Secondary | ICD-10-CM

## 2020-04-29 LAB — CBC
HCT: 32.1 % — ABNORMAL LOW (ref 39.0–52.0)
Hemoglobin: 10.7 g/dL — ABNORMAL LOW (ref 13.0–17.0)
MCH: 28.8 pg (ref 26.0–34.0)
MCHC: 33.3 g/dL (ref 30.0–36.0)
MCV: 86.5 fL (ref 80.0–100.0)
Platelets: 280 10*3/uL (ref 150–400)
RBC: 3.71 MIL/uL — ABNORMAL LOW (ref 4.22–5.81)
RDW: 15.8 % — ABNORMAL HIGH (ref 11.5–15.5)
WBC: 7 10*3/uL (ref 4.0–10.5)
nRBC: 0 % (ref 0.0–0.2)

## 2020-04-29 LAB — BASIC METABOLIC PANEL
Anion gap: 13 (ref 5–15)
BUN: 23 mg/dL (ref 8–23)
CO2: 29 mmol/L (ref 22–32)
Calcium: 8.2 mg/dL — ABNORMAL LOW (ref 8.9–10.3)
Chloride: 92 mmol/L — ABNORMAL LOW (ref 98–111)
Creatinine, Ser: 6.61 mg/dL — ABNORMAL HIGH (ref 0.61–1.24)
GFR, Estimated: 9 mL/min — ABNORMAL LOW (ref >60)
Glucose, Bld: 106 mg/dL — ABNORMAL HIGH (ref 70–99)
Potassium: 3.6 mmol/L (ref 3.5–5.1)
Sodium: 134 mmol/L — ABNORMAL LOW (ref 135–145)

## 2020-04-29 LAB — MRSA PCR SCREENING: MRSA by PCR: POSITIVE — AB

## 2020-04-29 MED ORDER — MUPIROCIN 2 % EX OINT
1.0000 "application " | TOPICAL_OINTMENT | Freq: Two times a day (BID) | CUTANEOUS | Status: DC
  Administered 2020-04-29: 1 via NASAL
  Filled 2020-04-29: qty 22

## 2020-04-29 MED ORDER — CHLORHEXIDINE GLUCONATE CLOTH 2 % EX PADS
6.0000 | MEDICATED_PAD | Freq: Every day | CUTANEOUS | Status: DC
  Administered 2020-04-29: 6 via TOPICAL

## 2020-04-29 MED ORDER — POLYETHYLENE GLYCOL 3350 17 G PO PACK
17.0000 g | PACK | Freq: Every day | ORAL | Status: DC | PRN
  Administered 2020-04-29: 17 g via ORAL
  Filled 2020-04-29: qty 1

## 2020-04-29 NOTE — Progress Notes (Signed)
Pt admitted from ED to 4NP-05. Pt and A&O x4 with pleasant demeanor. Pt has light neck pain described as aching. Pupils are equal/reactive. Neuro exam WDL. No deficits observed. Educated pt on plan of care, correct use of call bell, and fall prevention strategies. Pt verbalized understanding. Will continue to monitor.

## 2020-04-29 NOTE — Plan of Care (Signed)

## 2020-04-29 NOTE — Progress Notes (Signed)
Discharge instructions discussed with patient and his brother, both verbalize understanding, deny questions.  Patient discharged home with brother via private vehicle.  All belongings accounted for by patient.  No changes noted in assessment.

## 2020-04-29 NOTE — TOC CAGE-AID Note (Signed)
Transition of Care [facility]) - CAGE-AID Screening   Patient Details  Name: Daniel Thomas MRN: [id number] Date of Birth: 26-Jan-1955   Elvina Sidle, RN Trauma Response Nurse Phone Number: 04/29/2020, 9:56 AM       CAGE-AID Screening:    Have You Ever Felt You Ought to Cut Down on Your Drinking or Drug Use?: No Have People Annoyed You By [employer] Your Drinking Or Drug Use?: No Have You Felt Bad Or Guilty About Your Drinking Or Drug Use?: No Have You Ever Had a Drink or Used Drugs First Thing In The Morning to Steady Your Nerves or to Get Rid of a Hangover?: No CAGE-AID Score: 0  Substance Abuse Education Offered: No (denies any alcohol or drug use)

## 2020-04-29 NOTE — Care Management CC44 (Signed)
Condition Code 44 Documentation Completed  Patient Details  Name: Daniel Thomas MRN: [id number] Date of Birth: August 04, 1955   Condition Code 44 given:  Yes Patient signature on Condition Code 44 notice:  Yes Documentation of 2 MD's agreement:  Yes Code 44 added to claim:  Yes    Dawayne Patricia, RN 04/29/2020, 3:20 PM
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
## 2020-04-29 NOTE — Plan of Care (Signed)
  Problem: Education: Goal: Knowledge of General Education information will improve Description: Including pain rating scale, medication(s)/side effects and non-pharmacologic comfort measures Outcome: Progressing   Problem: Health Behavior/Discharge Planning: Goal: Ability to manage health-related needs will improve Outcome: Progressing   Problem: Clinical Measurements: Goal: Ability to maintain clinical measurements within normal limits will improve Outcome: Progressing Goal: Will remain free from infection Outcome: Progressing Goal: Diagnostic test results will improve Outcome: Progressing Goal: Cardiovascular complication will be avoided Outcome: Progressing   Problem: Activity: Goal: Risk for activity intolerance will decrease Outcome: Progressing   Problem: Nutrition: Goal: Adequate nutrition will be maintained Outcome: Progressing   Problem: Elimination: Goal: Will not experience complications related to urinary retention Outcome: Progressing   Problem: Pain Managment: Goal: General experience of comfort will improve Outcome: Progressing   Problem: Safety: Goal: Ability to remain free from injury will improve Outcome: Progressing   Problem: Skin Integrity: Goal: Risk for impaired skin integrity will decrease Outcome: Progressing   

## 2020-04-29 NOTE — Care Management Obs Status (Signed)
[place] NOTIFICATION   Patient Details  Name: Daniel Thomas MRN: [id number] Date of Birth: 07-26-1955   Medicare Observation Status Notification Given:  Yes    Dawayne Patricia, RN 04/29/2020, 3:20 PM

## 2020-06-20 ENCOUNTER — Inpatient Hospital Stay (HOSPITAL_COMMUNITY)
Admission: EM | Admit: 2020-06-20 | Discharge: 2020-07-03 | DRG: 870 | Disposition: E | Payer: Medicare Other | Attending: Internal Medicine | Admitting: Internal Medicine

## 2020-06-20 ENCOUNTER — Emergency Department (HOSPITAL_COMMUNITY): Payer: Medicare Other

## 2020-06-20 ENCOUNTER — Other Ambulatory Visit: Payer: Self-pay

## 2020-06-20 ENCOUNTER — Encounter (HOSPITAL_COMMUNITY): Payer: Self-pay | Admitting: *Deleted

## 2020-06-20 DIAGNOSIS — G935 Compression of brain: Secondary | ICD-10-CM | POA: Diagnosis not present

## 2020-06-20 DIAGNOSIS — K8000 Calculus of gallbladder with acute cholecystitis without obstruction: Secondary | ICD-10-CM | POA: Diagnosis present

## 2020-06-20 DIAGNOSIS — Z823 Family history of stroke: Secondary | ICD-10-CM

## 2020-06-20 DIAGNOSIS — R112 Nausea with vomiting, unspecified: Secondary | ICD-10-CM

## 2020-06-20 DIAGNOSIS — J15212 Pneumonia due to Methicillin resistant Staphylococcus aureus: Secondary | ICD-10-CM | POA: Diagnosis not present

## 2020-06-20 DIAGNOSIS — N2581 Secondary hyperparathyroidism of renal origin: Secondary | ICD-10-CM | POA: Diagnosis present

## 2020-06-20 DIAGNOSIS — D631 Anemia in chronic kidney disease: Secondary | ICD-10-CM | POA: Diagnosis present

## 2020-06-20 DIAGNOSIS — J439 Emphysema, unspecified: Secondary | ICD-10-CM | POA: Diagnosis not present

## 2020-06-20 DIAGNOSIS — I132 Hypertensive heart and chronic kidney disease with heart failure and with stage 5 chronic kidney disease, or end stage renal disease: Secondary | ICD-10-CM | POA: Diagnosis present

## 2020-06-20 DIAGNOSIS — E11649 Type 2 diabetes mellitus with hypoglycemia without coma: Secondary | ICD-10-CM | POA: Diagnosis not present

## 2020-06-20 DIAGNOSIS — E8889 Other specified metabolic disorders: Secondary | ICD-10-CM | POA: Diagnosis not present

## 2020-06-20 DIAGNOSIS — I5032 Chronic diastolic (congestive) heart failure: Secondary | ICD-10-CM | POA: Diagnosis present

## 2020-06-20 DIAGNOSIS — Z20822 Contact with and (suspected) exposure to covid-19: Secondary | ICD-10-CM | POA: Diagnosis present

## 2020-06-20 DIAGNOSIS — G931 Anoxic brain damage, not elsewhere classified: Secondary | ICD-10-CM | POA: Diagnosis not present

## 2020-06-20 DIAGNOSIS — E871 Hypo-osmolality and hyponatremia: Secondary | ICD-10-CM | POA: Diagnosis not present

## 2020-06-20 DIAGNOSIS — Z01818 Encounter for other preprocedural examination: Secondary | ICD-10-CM | POA: Diagnosis not present

## 2020-06-20 DIAGNOSIS — Z79899 Other long term (current) drug therapy: Secondary | ICD-10-CM

## 2020-06-20 DIAGNOSIS — Y848 Other medical procedures as the cause of abnormal reaction of the patient, or of later complication, without mention of misadventure at the time of the procedure: Secondary | ICD-10-CM | POA: Diagnosis not present

## 2020-06-20 DIAGNOSIS — K567 Ileus, unspecified: Secondary | ICD-10-CM

## 2020-06-20 DIAGNOSIS — N186 End stage renal disease: Secondary | ICD-10-CM | POA: Diagnosis present

## 2020-06-20 DIAGNOSIS — Z992 Dependence on renal dialysis: Secondary | ICD-10-CM

## 2020-06-20 DIAGNOSIS — B9562 Methicillin resistant Staphylococcus aureus infection as the cause of diseases classified elsewhere: Secondary | ICD-10-CM | POA: Diagnosis not present

## 2020-06-20 DIAGNOSIS — T886XXA Anaphylactic reaction due to adverse effect of correct drug or medicament properly administered, initial encounter: Secondary | ICD-10-CM | POA: Diagnosis not present

## 2020-06-20 DIAGNOSIS — I469 Cardiac arrest, cause unspecified: Secondary | ICD-10-CM | POA: Diagnosis not present

## 2020-06-20 DIAGNOSIS — E1122 Type 2 diabetes mellitus with diabetic chronic kidney disease: Secondary | ICD-10-CM

## 2020-06-20 DIAGNOSIS — E1129 Type 2 diabetes mellitus with other diabetic kidney complication: Secondary | ICD-10-CM | POA: Diagnosis present

## 2020-06-20 DIAGNOSIS — R0902 Hypoxemia: Secondary | ICD-10-CM

## 2020-06-20 DIAGNOSIS — K810 Acute cholecystitis: Secondary | ICD-10-CM | POA: Diagnosis present

## 2020-06-20 DIAGNOSIS — J9601 Acute respiratory failure with hypoxia: Secondary | ICD-10-CM

## 2020-06-20 DIAGNOSIS — Z8249 Family history of ischemic heart disease and other diseases of the circulatory system: Secondary | ICD-10-CM

## 2020-06-20 DIAGNOSIS — G936 Cerebral edema: Secondary | ICD-10-CM | POA: Diagnosis not present

## 2020-06-20 DIAGNOSIS — K851 Biliary acute pancreatitis without necrosis or infection: Secondary | ICD-10-CM

## 2020-06-20 DIAGNOSIS — I9589 Other hypotension: Secondary | ICD-10-CM | POA: Diagnosis not present

## 2020-06-20 DIAGNOSIS — I10 Essential (primary) hypertension: Secondary | ICD-10-CM | POA: Diagnosis not present

## 2020-06-20 DIAGNOSIS — G9389 Other specified disorders of brain: Secondary | ICD-10-CM | POA: Diagnosis not present

## 2020-06-20 DIAGNOSIS — Z515 Encounter for palliative care: Secondary | ICD-10-CM | POA: Diagnosis not present

## 2020-06-20 DIAGNOSIS — Z87891 Personal history of nicotine dependence: Secondary | ICD-10-CM

## 2020-06-20 DIAGNOSIS — Z4659 Encounter for fitting and adjustment of other gastrointestinal appliance and device: Secondary | ICD-10-CM

## 2020-06-20 DIAGNOSIS — Z9889 Other specified postprocedural states: Secondary | ICD-10-CM

## 2020-06-20 DIAGNOSIS — K7200 Acute and subacute hepatic failure without coma: Secondary | ICD-10-CM | POA: Diagnosis present

## 2020-06-20 DIAGNOSIS — Z978 Presence of other specified devices: Secondary | ICD-10-CM

## 2020-06-20 DIAGNOSIS — J449 Chronic obstructive pulmonary disease, unspecified: Secondary | ICD-10-CM | POA: Diagnosis present

## 2020-06-20 DIAGNOSIS — I4901 Ventricular fibrillation: Secondary | ICD-10-CM | POA: Diagnosis not present

## 2020-06-20 DIAGNOSIS — G253 Myoclonus: Secondary | ICD-10-CM | POA: Diagnosis not present

## 2020-06-20 DIAGNOSIS — A419 Sepsis, unspecified organism: Principal | ICD-10-CM | POA: Diagnosis present

## 2020-06-20 DIAGNOSIS — I468 Cardiac arrest due to other underlying condition: Secondary | ICD-10-CM | POA: Diagnosis not present

## 2020-06-20 DIAGNOSIS — F32A Depression, unspecified: Secondary | ICD-10-CM | POA: Diagnosis present

## 2020-06-20 DIAGNOSIS — R6520 Severe sepsis without septic shock: Secondary | ICD-10-CM | POA: Diagnosis present

## 2020-06-20 DIAGNOSIS — E1165 Type 2 diabetes mellitus with hyperglycemia: Secondary | ICD-10-CM | POA: Diagnosis not present

## 2020-06-20 DIAGNOSIS — G9341 Metabolic encephalopathy: Secondary | ICD-10-CM | POA: Diagnosis not present

## 2020-06-20 DIAGNOSIS — Z7189 Other specified counseling: Secondary | ICD-10-CM | POA: Diagnosis not present

## 2020-06-20 DIAGNOSIS — T508X5A Adverse effect of diagnostic agents, initial encounter: Secondary | ICD-10-CM | POA: Diagnosis not present

## 2020-06-20 DIAGNOSIS — J0410 Acute tracheitis without obstruction: Secondary | ICD-10-CM | POA: Diagnosis not present

## 2020-06-20 DIAGNOSIS — R1114 Bilious vomiting: Secondary | ICD-10-CM | POA: Diagnosis not present

## 2020-06-20 DIAGNOSIS — J9600 Acute respiratory failure, unspecified whether with hypoxia or hypercapnia: Secondary | ICD-10-CM

## 2020-06-20 DIAGNOSIS — N400 Enlarged prostate without lower urinary tract symptoms: Secondary | ICD-10-CM | POA: Diagnosis present

## 2020-06-20 DIAGNOSIS — E875 Hyperkalemia: Secondary | ICD-10-CM | POA: Diagnosis not present

## 2020-06-20 DIAGNOSIS — E7800 Pure hypercholesterolemia, unspecified: Secondary | ICD-10-CM | POA: Diagnosis present

## 2020-06-20 DIAGNOSIS — R748 Abnormal levels of other serum enzymes: Secondary | ICD-10-CM | POA: Diagnosis present

## 2020-06-20 LAB — CBC WITH DIFFERENTIAL/PLATELET
Abs Immature Granulocytes: 0.03 10*3/uL (ref 0.00–0.07)
Basophils Absolute: 0 10*3/uL (ref 0.0–0.1)
Basophils Relative: 0 %
Eosinophils Absolute: 0.1 10*3/uL (ref 0.0–0.5)
Eosinophils Relative: 1 %
HCT: 32.1 % — ABNORMAL LOW (ref 39.0–52.0)
Hemoglobin: 10.4 g/dL — ABNORMAL LOW (ref 13.0–17.0)
Immature Granulocytes: 0 %
Lymphocytes Relative: 8 %
Lymphs Abs: 0.8 10*3/uL (ref 0.7–4.0)
MCH: 30.6 pg (ref 26.0–34.0)
MCHC: 32.4 g/dL (ref 30.0–36.0)
MCV: 94.4 fL (ref 80.0–100.0)
Monocytes Absolute: 0.4 10*3/uL (ref 0.1–1.0)
Monocytes Relative: 4 %
Neutro Abs: 8.7 10*3/uL — ABNORMAL HIGH (ref 1.7–7.7)
Neutrophils Relative %: 87 %
Platelets: 335 10*3/uL (ref 150–400)
RBC: 3.4 MIL/uL — ABNORMAL LOW (ref 4.22–5.81)
RDW: 14.8 % (ref 11.5–15.5)
WBC: 10.1 10*3/uL (ref 4.0–10.5)
nRBC: 0 % (ref 0.0–0.2)

## 2020-06-20 LAB — COMPREHENSIVE METABOLIC PANEL
ALT: 20 U/L (ref 0–44)
AST: 20 U/L (ref 15–41)
Albumin: 3.8 g/dL (ref 3.5–5.0)
Alkaline Phosphatase: 111 U/L (ref 38–126)
Anion gap: 16 — ABNORMAL HIGH (ref 5–15)
BUN: 45 mg/dL — ABNORMAL HIGH (ref 8–23)
CO2: 24 mmol/L (ref 22–32)
Calcium: 8.3 mg/dL — ABNORMAL LOW (ref 8.9–10.3)
Chloride: 95 mmol/L — ABNORMAL LOW (ref 98–111)
Creatinine, Ser: 9.62 mg/dL — ABNORMAL HIGH (ref 0.61–1.24)
GFR, Estimated: 6 mL/min — ABNORMAL LOW (ref >60)
Glucose, Bld: 156 mg/dL — ABNORMAL HIGH (ref 70–99)
Potassium: 4.4 mmol/L (ref 3.5–5.1)
Sodium: 135 mmol/L (ref 135–145)
Total Bilirubin: 0.7 mg/dL (ref 0.3–1.2)
Total Protein: 7.4 g/dL (ref 6.5–8.1)

## 2020-06-20 LAB — GLUCOSE, CAPILLARY
Glucose-Capillary: 135 mg/dL — ABNORMAL HIGH (ref 70–99)
Glucose-Capillary: 139 mg/dL — ABNORMAL HIGH (ref 70–99)

## 2020-06-20 LAB — TROPONIN I (HIGH SENSITIVITY)
Troponin I (High Sensitivity): 60 ng/L — ABNORMAL HIGH (ref <18)
Troponin I (High Sensitivity): 57 ng/L — ABNORMAL HIGH (ref <18)

## 2020-06-20 LAB — LIPASE, BLOOD: Lipase: 108 U/L — ABNORMAL HIGH (ref 11–51)

## 2020-06-20 MED ORDER — ACETAMINOPHEN 325 MG PO TABS: 650.0000 mg | ORAL_TABLET | Freq: Four times a day (QID) | ORAL | Status: DC | PRN

## 2020-06-20 MED ORDER — POLYETHYLENE GLYCOL 3350 17 G PO PACK: 17.0000 g | PACK | Freq: Every day | ORAL | Status: DC | PRN

## 2020-06-20 MED ORDER — CALCIUM CARBONATE ANTACID 500 MG PO CHEW: 1.0000 | CHEWABLE_TABLET | Freq: Four times a day (QID) | ORAL | Status: DC | PRN

## 2020-06-20 MED ORDER — ATORVASTATIN CALCIUM 40 MG PO TABS
40.0000 mg | ORAL_TABLET | Freq: Every day | ORAL | Status: DC
  Administered 2020-06-21: 40 mg via ORAL
  Filled 2020-06-20: qty 1

## 2020-06-20 MED ORDER — LOSARTAN POTASSIUM 50 MG PO TABS: 50.0000 mg | ORAL_TABLET | Freq: Every day | ORAL | Status: DC

## 2020-06-20 MED ORDER — HYDRALAZINE HCL 25 MG PO TABS
25.0000 mg | ORAL_TABLET | Freq: Three times a day (TID) | ORAL | Status: DC
  Administered 2020-06-20 – 2020-06-22 (×6): 25 mg via ORAL
  Filled 2020-06-20 (×6): qty 1

## 2020-06-20 MED ORDER — CITALOPRAM HYDROBROMIDE 20 MG PO TABS
20.0000 mg | ORAL_TABLET | Freq: Every day | ORAL | Status: DC
  Administered 2020-06-20 – 2020-06-22 (×3): 20 mg via ORAL
  Filled 2020-06-20 (×3): qty 1

## 2020-06-20 MED ORDER — ONDANSETRON HCL 4 MG PO TABS: 4.0000 mg | ORAL_TABLET | Freq: Four times a day (QID) | ORAL | Status: DC | PRN

## 2020-06-20 MED ORDER — ENOXAPARIN SODIUM 30 MG/0.3ML IJ SOSY
30.0000 mg | PREFILLED_SYRINGE | INTRAMUSCULAR | Status: DC
  Administered 2020-06-21 – 2020-06-23 (×3): 30 mg via SUBCUTANEOUS
  Filled 2020-06-20 (×3): qty 0.3

## 2020-06-20 MED ORDER — ACETAMINOPHEN 650 MG RE SUPP: 650.0000 mg | Freq: Four times a day (QID) | RECTAL | Status: DC | PRN

## 2020-06-20 MED ORDER — SODIUM CHLORIDE 0.9 % IV SOLN
1.0000 g | INTRAVENOUS | Status: DC
  Administered 2020-06-20 – 2020-06-22 (×3): 1 g via INTRAVENOUS
  Filled 2020-06-20 (×4): qty 1

## 2020-06-20 MED ORDER — METHOCARBAMOL 1000 MG/10ML IJ SOLN
500.0000 mg | Freq: Four times a day (QID) | INTRAVENOUS | Status: DC | PRN
  Filled 2020-06-20 (×4): qty 5

## 2020-06-20 MED ORDER — INSULIN ASPART 100 UNIT/ML IJ SOLN
0.0000 [IU] | Freq: Three times a day (TID) | INTRAMUSCULAR | Status: DC
  Administered 2020-06-21: 1 [IU] via SUBCUTANEOUS
  Administered 2020-06-23: 5 [IU] via SUBCUTANEOUS

## 2020-06-20 MED ORDER — LACTATED RINGERS IV SOLN: INTRAVENOUS | Status: DC

## 2020-06-20 MED ORDER — CARVEDILOL 12.5 MG PO TABS
12.5000 mg | ORAL_TABLET | Freq: Two times a day (BID) | ORAL | Status: DC
  Administered 2020-06-20 – 2020-06-22 (×5): 12.5 mg via ORAL
  Filled 2020-06-20 (×6): qty 1

## 2020-06-20 MED ORDER — METOPROLOL TARTRATE 5 MG/5ML IV SOLN: 5.0000 mg | Freq: Four times a day (QID) | INTRAVENOUS | Status: DC | PRN

## 2020-06-20 MED ORDER — SEVELAMER CARBONATE 800 MG PO TABS
1600.0000 mg | ORAL_TABLET | Freq: Three times a day (TID) | ORAL | Status: DC
  Administered 2020-06-20 – 2020-06-21 (×3): 1600 mg via ORAL
  Filled 2020-06-20 (×6): qty 2

## 2020-06-20 MED ORDER — CHLORHEXIDINE GLUCONATE CLOTH 2 % EX PADS
6.0000 | MEDICATED_PAD | Freq: Every day | CUTANEOUS | Status: DC
  Administered 2020-06-21 – 2020-06-23 (×3): 6 via TOPICAL

## 2020-06-20 MED ORDER — OXYCODONE HCL 5 MG PO TABS: 5.0000 mg | ORAL_TABLET | ORAL | Status: DC | PRN

## 2020-06-20 MED ORDER — AMLODIPINE BESYLATE 10 MG PO TABS
10.0000 mg | ORAL_TABLET | Freq: Every day | ORAL | Status: DC
  Administered 2020-06-21 (×2): 10 mg via ORAL
  Filled 2020-06-20 (×2): qty 1

## 2020-06-20 MED ORDER — TRAZODONE HCL 50 MG PO TABS
25.0000 mg | ORAL_TABLET | Freq: Every day | ORAL | Status: DC
  Administered 2020-06-21: 25 mg via ORAL
  Filled 2020-06-20: qty 1

## 2020-06-20 MED ORDER — ONDANSETRON HCL 4 MG/2ML IJ SOLN: 4.0000 mg | Freq: Four times a day (QID) | INTRAMUSCULAR | Status: DC | PRN

## 2020-06-20 MED ORDER — ONDANSETRON HCL 4 MG/2ML IJ SOLN
4.0000 mg | Freq: Once | INTRAMUSCULAR | Status: AC
  Administered 2020-06-20: 4 mg via INTRAVENOUS
  Filled 2020-06-20: qty 2

## 2020-06-20 MED ORDER — METRONIDAZOLE 500 MG/100ML IV SOLN
500.0000 mg | Freq: Three times a day (TID) | INTRAVENOUS | Status: DC
  Administered 2020-06-20 – 2020-07-01 (×32): 500 mg via INTRAVENOUS
  Filled 2020-06-20 (×33): qty 100

## 2020-06-20 NOTE — H&P (Signed)
[facility] History and Physical  GLOVER CAPANO LNL:[id number] DOB: 04/17/1955 DOA: 06/09/2020  Referring physician: Franchot Heidelberg, PA PCP: Marguerita Merles, MD   Chief Complaint: nausea and vomiting  HPI: Daniel Thomas is a 65 y.o. male with hx of end-stage renal disease on hospice, COPD, CHF, type 2 diabetes hypertension, who presents with vomiting.  Patient reports that last evening he had some tacos for dinner.  In the middle of the night he awoke with significant nausea and vomiting and had multiple bouts of emesis.  Had significant abdominal does endorse abdominal distention.  Since arriving to the hospital he has had multiple bowel movements as well but within normal.  He endorses a history of abdominal bloat and discomfort fatty heavy meals.  Prior history of problems with his gallbladder.  Denies fevers.  Has not missed any dialysis sessions recently except for the one that was scheduled today, no missed medications.  In the ED initial vital signs notable only for mild hypertension.  CMP was notable for creatinine of 9.6, normal AST and ALT, normal T bili, elevated lipase at 108.  CBC without leukocytosis, stable of 10.  Troponin was obtained and was mildly elevated at 60, downtrending to 57 on recheck 2 hours later.  EKG was obtained and was unchanged from prior.  Right upper quadrant ultrasound was obtained which showed mildly distended gallbladder with cholelithiasis as well as a positive Murphy sign concerning for acute cholecystitis.  He was treated symptomatically with Zofran with good effect.  General surgery was consulted regarding his gallbladder, nephrology was consulted regarding his ESRD, he was admitted to the medicine service for further work-up and management.  Review of Systems:  Pertinent positives and negative per HPI, all others reviewed and negative  Past Medical History:  Diagnosis Date   COPD (chronic obstructive pulmonary disease) ([place])    Diabetes  mellitus without complication ([place])    Enlarged prostate    ESRD on dialysis ([place])    Heart murmur    High cholesterol    Hypertension    Past Surgical History:  Procedure Laterality Date   DIALYSIS/PERMA CATHETER INSERTION N/A 01/12/2018   Procedure: DIALYSIS/PERMA CATHETER INSERTION;  Surgeon: Katha Cabal, MD;  Location: [place] [facility];  Service: Cardiovascular;  Laterality: N/A;   DIALYSIS/PERMA CATHETER INSERTION N/A 05/16/2019   Procedure: DIALYSIS/PERMA CATHETER INSERTION;  Surgeon: Algernon Huxley, MD;  Location: [place] [facility];  Service: Cardiovascular;  Laterality: N/A;   DIALYSIS/PERMA CATHETER INSERTION N/A 10/17/2019   Procedure: DIALYSIS/PERMA CATHETER INSERTION;  Surgeon: Algernon Huxley, MD;  Location: [place] [facility];  Service: Cardiovascular;  Laterality: N/A;   DIALYSIS/PERMA CATHETER INSERTION N/A 12/10/2019   Procedure: DIALYSIS/PERMA CATHETER INSERTION;  Surgeon: Katha Cabal, MD;  Location: [place] [facility];  Service: Cardiovascular;  Laterality: N/A;   DIALYSIS/PERMA CATHETER REMOVAL N/A 05/13/2019   Procedure: DIALYSIS/PERMA CATHETER REMOVAL;  Surgeon: Algernon Huxley, MD;  Location: [place] [facility];  Service: Cardiovascular;  Laterality: N/A;   DIALYSIS/PERMA CATHETER REMOVAL N/A 12/06/2019   Procedure: DIALYSIS/PERMA CATHETER REMOVAL;  Surgeon: Algernon Huxley, MD;  Location: [place] [facility];  Service: Cardiovascular;  Laterality: N/A;   TEE WITHOUT CARDIOVERSION N/A 05/15/2019   Procedure: TRANSESOPHAGEAL ECHOCARDIOGRAM (TEE);  Surgeon: Teodoro Spray, MD;  Location: [facility];  Service: Cardiovascular;  Laterality: N/A;   Social History:  reports that he has quit smoking. He has never used smokeless tobacco. He reports that he does not  drink alcohol and does not use drugs.  Allergies  Allergen Reactions   Tape Dermatitis and Rash    No family history on file.   Prior to Admission medications   Medication Sig Start Date End Date  Taking? Authorizing Provider  albuterol (VENTOLIN HFA) 108 (90 Base) MCG/ACT inhaler Inhale 2 puffs into the lungs every 6 (six) hours as needed for wheezing or shortness of breath.    [provider]  amLODipine (NORVASC) 10 MG tablet Take 1 tablet (10 mg total) by mouth at bedtime. 12/05/19   Loletha Grayer, MD  atorvastatin (LIPITOR) 40 MG tablet Take 40 mg by mouth at bedtime.    [provider]  calcium carbonate (TUMS - DOSED IN MG ELEMENTAL CALCIUM) 500 MG chewable tablet Chew 1 tablet by mouth every 6 (six) hours as needed for indigestion.    [provider]  carvedilol (COREG) 12.5 MG tablet Take 12.5 mg by mouth 2 (two) times daily with a meal. 03/31/18   [provider]  citalopram (CELEXA) 20 MG tablet Take 20 mg by mouth daily.    [provider]  ergocalciferol (VITAMIN D2) 1.25 MG (50000 UT) capsule Take by mouth. Administered at analysis 02/02/18   [provider]  ferric citrate (AURYXIA) 1 GM 210 MG(Fe) tablet Take by mouth.  04/29/19   [provider]  fluticasone (FLONASE) 50 MCG/ACT nasal spray Place 1 spray into both nostrils daily. 04/27/20   [provider]  hydrALAZINE (APRESOLINE) 25 MG tablet Take 1 tablet (25 mg total) by mouth every 8 (eight) hours. 05/17/19   Nolberto Hanlon, MD  lidocaine (LIDODERM) 5 % Place 1 patch onto the skin every 12 (twelve) hours. Remove & Discard patch within 12 hours or as directed by MD Patient not taking: Reported on 04/29/2020 11/10/19 11/09/20  Laban Emperor, PA-C  losartan (COZAAR) 50 MG tablet Take 1 tablet (50 mg total) by mouth at bedtime. 12/05/19   Loletha Grayer, MD  sevelamer carbonate (RENVELA) 800 MG tablet Take 1,600 mg by mouth 3 (three) times daily with meals. 12/23/19   [provider]  traZODone (DESYREL) 50 MG tablet Take 50 mg by mouth at bedtime.    [provider]   Physical Exam: Vitals:   06/15/2020 0640 06/05/2020 0645 06/07/2020 0856  06/19/2020 1000  BP:  (!) 152/74 (!) 158/74   Pulse:  85 79   Resp:  (!) 8 18   Temp:      TempSrc:      SpO2:  100% 100%   Weight: 5.897 kg   72.6 kg  Height: 5\' 1"  (1.549 m)   5\' 1"  (1.549 m)    Wt Readings from Last 3 Encounters:  06/07/2020 72.6 kg  01/15/20 75.3 kg  12/11/19 75.7 kg     General:  Appears calm and comfortable Eyes: PERRL, normal lids, irises & conjunctiva ENT: grossly normal hearing, lips & tongue Neck: no masses Cardiovascular: RRR, no m/r/g. No LE edema. Telemetry: SR, no arrhythmias  Respiratory: CTA bilaterally, no w/r/r. Normal respiratory effort. Abdomen: soft, notably distended, tender to palpation only in RUQ Skin: no rash or induration seen on limited exam Musculoskeletal: grossly normal tone BUE/BLE Psychiatric: grossly normal mood and affect, speech fluent and appropriate Neurologic: grossly non-focal.          Labs on Admission:  Basic Metabolic Panel: Recent Labs  Lab 06/24/2020 0720  NA 135  K 4.4  CL 95*  CO2 24  GLUCOSE 156*  BUN  45*  CREATININE 9.62*  CALCIUM 8.3*   Liver Function Tests: Recent Labs  Lab 06/19/2020 0720  AST 20  ALT 20  ALKPHOS 111  BILITOT 0.7  PROT 7.4  ALBUMIN 3.8   Recent Labs  Lab 06/09/2020 0720  LIPASE 108*   No results for input(s): AMMONIA in the last 168 hours. CBC: Recent Labs  Lab 06/24/2020 0720  WBC 10.1  NEUTROABS 8.7*  HGB 10.4*  HCT 32.1*  MCV 94.4  PLT 335   Cardiac Enzymes: No results for input(s): CKTOTAL, CKMB, CKMBINDEX, TROPONINI in the last 168 hours.  BNP (last 3 results) Recent Labs    12/05/19 0402  BNP 902.6*    ProBNP (last 3 results) No results for input(s): PROBNP in the last 8760 hours.  CBG: No results for input(s): GLUCAP in the last 168 hours.  Radiological Exams on Admission: US Abdomen Limited RUQ (LIVER/GB)  Result Date: 06/11/2020 CLINICAL DATA:  65 year old with nausea and vomiting. EXAM: ULTRASOUND ABDOMEN LIMITED RIGHT UPPER QUADRANT  COMPARISON:  01/15/2018 FINDINGS: Gallbladder: Gallbladder is mildly distended and there are multiple small echogenic foci compatible with shadowing gallstones. Index stone measures approximately 5 mm. Reportedly, the patient has a sonographic Murphy sign. There is no evidence for gallbladder wall thickening or pericholecystic fluid. Evidence for echogenic sludge. Common bile duct: Diameter: 3 mm Liver: No focal lesion identified. Within normal limits in parenchymal echogenicity. Portal vein is patent on color Doppler imaging with normal direction of blood flow towards the liver. Other: None. IMPRESSION: Gallbladder is mildly distended with cholelithiasis. Reportedly, the patient has a sonographic Murphy sign. Findings are concerning for acute cholecystitis. Electronically Signed   By: Markus Daft M.D.   On: 06/08/2020 08:44    EKG: Independently reviewed.  Sinus rhythm, normal axis, T wave inversion in aVL, intraventricular conduction delay likely left-sided, no other ST or T changes, mildly prolonged QTC.  Compared to prior there are no significant changes.  Assessment/Plan Active Problems:   Severe sepsis (HCC)   ESRD (end stage renal disease) (HCC)   Chronic obstructive pulmonary disease, unspecified ([place])   Essential hypertension   DM (diabetes mellitus), type 2 with renal complications ([place])   Acute cholecystitis   #?Acute cholecystitis? #?Gallstone pancreatitis? Patient presenting with acute nausea vomiting, sonographic findings suggestive of but not definitive for cholecystitis, and elevated lipase.  Do not suspect to his ESRD is contributing given the acuteness of the symptoms and sonographic findings.  Viral gastroenteritis is another consideration.  No history of abdominal surgery and having bowel movements.  Obstruction seems unlikely.  Overall very well-appearing but given comorbidities high risk for progression, will treat conservatively with antibiotics pending surgery consultation. -  Cefepime per pharmacy protocol, Flagyl 5 mg IV daily - N.p.o. - LR at 100 cc/h-pain meds as needed - Follow-up surgery recommendations for further clarify diagnosis  #ESRD Nephrology has been consulted from the ED, appreciate their assistance with coordinating dialysis. - Continue calcium carbonate, sevelamer  #DM2 Hold Tradjenta in setting of n.p.o. status, check sugars 4 times daily with very sensitive sliding scale correction factor.  #Known medical problems Hypertension-continue amlodipine, carvedilol, hydralazine, losartan Hyperlipidemia-continue atorvastatin Depression-continue citalopram, trazodone COPD-nebs as needed  Code Status: Full code, confirmed DVT Prophylaxis: Lovenox Family Communication: brother updated at bedside Disposition Plan: Inpatient, Med-Surg   Time spent: 50 min  Clarnce Flock MD/MPH [facility]  Note:  This document was prepared using Systems analyst and may include unintentional dictation errors.
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
## 2020-06-20 NOTE — ED Notes (Signed)
Report attempted x 1

## 2020-06-20 NOTE — ED Notes (Signed)
Patient transported to Ultrasound 

## 2020-06-20 NOTE — Consult Note (Addendum)
Daniel Thomas 1955/02/06  [id number].    Requesting MD: Dr. Dione Plover Chief Complaint/Reason for Consult: RUQ abdominal pain/Possible acute cholecystitis   HPI: Daniel Thomas is a 65 y.o. male with a hx of ESRD on HD (T/T/S), COPD, HTN, HLD and DM2 who presented to the ED on 6/18 for abdominal pain, nausea or vomiting.  Patient reports that last night around 930pm he had spicy tacos.  He went to bed as normal and awoke around 2 AM with right upper quadrant abdominal pain without radiation, abdominal bloating, nausea, chills and diaphoresis.  He had 3 episodes of emesis with the last being at 2:30 AM.  In the ED he was afebrile without tachycardia or hypotension.  WBC within normal limits at 10.1.  LFTs within normal limits.  Lipase mildly elevated at 108 (denies any alcohol use). RUQ Korea w/ cholelithiasis without gallbladder wall thickening or pericholecystitis fluid. He did have a +Murphy's signs when undergoing [place] which raised the suspicion for Cholecystitis. We were asked to see.   Patient notes that presently he has no abdominal pain except for with palpation of the right upper quadrant.  His nausea, vomiting and bloating have resolved.  He denies any associated fever, chest pain, shortness of breath or constipation.  He is passing flatus and has had 2 episodes of diarrhea since onset.  He reports history of similar pain 3 weeks ago after eating tacos that resolved on its own.  Patient denies any prior abdominal surgeries.  He is not on blood thinners.  ROS: Review of Systems  Constitutional:  Positive for chills and diaphoresis. Negative for fever and malaise/fatigue.  Respiratory:  Negative for cough and shortness of breath.   Cardiovascular:  Negative for chest pain.  Gastrointestinal:  Positive for abdominal pain, diarrhea, nausea and vomiting. Negative for constipation.  Genitourinary:  Negative for dysuria.  Musculoskeletal:  Negative for back pain.  Psychiatric/Behavioral:   Negative for substance abuse.   All other systems reviewed and are negative.  No family history on file.  Past Medical History:  Diagnosis Date   COPD (chronic obstructive pulmonary disease) ([place])    Diabetes mellitus without complication ([place])    Enlarged prostate    ESRD on dialysis ([place])    Heart murmur    High cholesterol    Hypertension     Past Surgical History:  Procedure Laterality Date   DIALYSIS/PERMA CATHETER INSERTION N/A 01/12/2018   Procedure: DIALYSIS/PERMA CATHETER INSERTION;  Surgeon: Katha Cabal, MD;  Location: [place] [facility];  Service: Cardiovascular;  Laterality: N/A;   DIALYSIS/PERMA CATHETER INSERTION N/A 05/16/2019   Procedure: DIALYSIS/PERMA CATHETER INSERTION;  Surgeon: Algernon Huxley, MD;  Location: [facility];  Service: Cardiovascular;  Laterality: N/A;   DIALYSIS/PERMA CATHETER INSERTION N/A 10/17/2019   Procedure: DIALYSIS/PERMA CATHETER INSERTION;  Surgeon: Algernon Huxley, MD;  Location: [facility];  Service: Cardiovascular;  Laterality: N/A;   DIALYSIS/PERMA CATHETER INSERTION N/A 12/10/2019   Procedure: DIALYSIS/PERMA CATHETER INSERTION;  Surgeon: Katha Cabal, MD;  Location: [facility];  Service: Cardiovascular;  Laterality: N/A;   DIALYSIS/PERMA CATHETER REMOVAL N/A 05/13/2019   Procedure: DIALYSIS/PERMA CATHETER REMOVAL;  Surgeon: Algernon Huxley, MD;  Location: [facility];  Service: Cardiovascular;  Laterality: N/A;   DIALYSIS/PERMA CATHETER REMOVAL N/A 12/06/2019   Procedure: DIALYSIS/PERMA CATHETER REMOVAL;  Surgeon: Algernon Huxley, MD;  Location: [facility];  Service: Cardiovascular;  Laterality: N/A;   TEE  WITHOUT CARDIOVERSION N/A 05/15/2019   Procedure: TRANSESOPHAGEAL ECHOCARDIOGRAM (TEE);  Surgeon: Teodoro Spray, MD;  Location: [facility];  Service: Cardiovascular;  Laterality: N/A;    Social History:  reports that he has quit smoking. He has never used smokeless tobacco. He reports  that he does not drink alcohol and does not use drugs. No alcohol use No illicit drug use No tobacco use Retired  Allergies:  Allergies  Allergen Reactions   Tape Dermatitis and Rash    (Not in a hospital admission)    Physical Exam: Blood pressure (!) 158/74, pulse 79, temperature 98.3 F (36.8 C), temperature source Oral, resp. rate 18, height 5\' 1"  (1.549 m), weight 72.6 kg, SpO2 100 %. General: pleasant, WD/WN AA male who is laying in bed in NAD HEENT: head is normocephalic, atraumatic.  Sclera are noninjected and without sclearl icterus.  PERRL.  Ears and nose without any masses or lesions.  Mouth is pink and moist. Dentition fair Heart: regular, rate, and rhythm.  Normal s1,s2. No obvious murmurs, gallops, or rubs noted.  Palpable radial pulses bilaterally  Lungs: CTAB, no wheezes, rhonchi, or rales noted.  Respiratory effort nonlabored Abd: Soft, protuberant abdomen with RUQ tenderness. No peritonitis. Negative Murphy's sign. Otherwise NT on my exam. +BS. No masses or organomegaly MS: no BUE/BLE edema, calves soft and nontender Skin: warm and dry with no masses, lesions, or rashes Psych: A&Ox4 with an appropriate affect Neuro: cranial nerves grossly intact, equal strength in BUE/BLE bilaterally, normal speech, thought process intact, gait not assessed  Results for orders placed or performed during the hospital encounter of 06/28/2020 (from the past 48 hour(s))  CBC with Differential     Status: Abnormal   Collection Time: 06/24/2020  7:20 AM  Result Value Ref Range   WBC 10.1 4.0 - 10.5 K/uL   RBC 3.40 (L) 4.22 - 5.81 MIL/uL   Hemoglobin 10.4 (L) 13.0 - 17.0 g/dL   HCT 32.1 (L) 39.0 - 52.0 %   MCV 94.4 80.0 - 100.0 fL   MCH 30.6 26.0 - 34.0 pg   MCHC 32.4 30.0 - 36.0 g/dL   RDW 14.8 11.5 - 15.5 %   Platelets 335 150 - 400 K/uL   nRBC 0.0 0.0 - 0.2 %   Neutrophils Relative % 87 %   Neutro Abs 8.7 (H) 1.7 - 7.7 K/uL   Lymphocytes Relative 8 %   Lymphs Abs 0.8 0.7 - 4.0  K/uL   Monocytes Relative 4 %   Monocytes Absolute 0.4 0.1 - 1.0 K/uL   Eosinophils Relative 1 %   Eosinophils Absolute 0.1 0.0 - 0.5 K/uL   Basophils Relative 0 %   Basophils Absolute 0.0 0.0 - 0.1 K/uL   Immature Granulocytes 0 %   Abs Immature Granulocytes 0.03 0.00 - 0.07 K/uL    Comment: Performed at [facility], [address] [place], [place] [address]  Comprehensive metabolic panel     Status: Abnormal   Collection Time: 06/15/2020  7:20 AM  Result Value Ref Range   Sodium 135 135 - 145 mmol/L   Potassium 4.4 3.5 - 5.1 mmol/L   Chloride 95 (L) 98 - 111 mmol/L   CO2 24 22 - 32 mmol/L   Glucose, Bld 156 (H) 70 - 99 mg/dL    Comment: Glucose reference range applies only to samples taken after fasting for at least 8 hours.   BUN 45 (H) 8 - 23 mg/dL   Creatinine, Ser 9.62 (H) 0.61 -  1.24 mg/dL   Calcium 8.3 (L) 8.9 - 10.3 mg/dL   Total Protein 7.4 6.5 - 8.1 g/dL   Albumin 3.8 3.5 - 5.0 g/dL   AST 20 15 - 41 U/L   ALT 20 0 - 44 U/L   Alkaline Phosphatase 111 38 - 126 U/L   Total Bilirubin 0.7 0.3 - 1.2 mg/dL   GFR, Estimated 6 (L) >60 mL/min    Comment: (NOTE) Calculated using the CKD-EPI Creatinine Equation (2021)    Anion gap 16 (H) 5 - 15    Comment: Performed at [facility] [address] [place], [place] [address]  Lipase, blood     Status: Abnormal   Collection Time: 06/23/2020  7:20 AM  Result Value Ref Range   Lipase 108 (H) 11 - 51 U/L    Comment: Performed at [facility] [address] [place],  [address]  Troponin I (High Sensitivity)     Status: Abnormal   Collection Time: 06/04/2020  7:20 AM  Result Value Ref Range   Troponin I (High Sensitivity) 60 (H) <18 ng/L    Comment: (NOTE) Elevated high sensitivity troponin I (hsTnI) values and significant  changes across serial measurements may suggest ACS but many other  chronic and acute conditions are known to elevate hsTnI results.  Refer to the "Links" section for chest pain  algorithms and additional  guidance. Performed at [facility], [place] [address], [place], [place] [address]   Troponin I (High Sensitivity)     Status: Abnormal   Collection Time: 06/14/2020  9:06 AM  Result Value Ref Range   Troponin I (High Sensitivity) 57 (H) <18 ng/L    Comment: (NOTE) Elevated high sensitivity troponin I (hsTnI) values and significant  changes across serial measurements may suggest ACS but many other  chronic and acute conditions are known to elevate hsTnI results.  Refer to the "Links" section for chest pain algorithms and additional  guidance. Performed at [facility], [place] [address] [place], [place] [address]    US Abdomen Limited RUQ (LIVER/GB)  Result Date: 06/16/2020 CLINICAL DATA:  65 year old with nausea and vomiting. EXAM: ULTRASOUND ABDOMEN LIMITED RIGHT UPPER QUADRANT COMPARISON:  01/15/2018 FINDINGS: Gallbladder: Gallbladder is mildly distended and there are multiple small echogenic foci compatible with shadowing gallstones. Index stone measures approximately 5 mm. Reportedly, the patient has a sonographic Murphy sign. There is no evidence for gallbladder wall thickening or pericholecystic fluid. Evidence for echogenic sludge. Common bile duct: Diameter: 3 mm Liver: No focal lesion identified. Within normal limits in parenchymal echogenicity. Portal vein is patent on color Doppler imaging with normal direction of blood flow towards the liver. Other: None. IMPRESSION: Gallbladder is mildly distended with cholelithiasis. Reportedly, the patient has a sonographic Murphy sign. Findings are concerning for acute cholecystitis. Electronically Signed   By: Markus Daft M.D.   On: 07/01/2020 08:44    Anti-infectives (From admission, onward)    Start     Dose/Rate Route Frequency Ordered Stop   06/18/2020 1030  metroNIDAZOLE (FLAGYL) IVPB 500 mg        500 mg 100 mL/hr over 60 Minutes Intravenous Every 8 hours 06/28/2020 1000     06/22/2020 1030  ceFEPIme  (MAXIPIME) 1 g in sodium chloride 0.9 % 100 mL IVPB        1 g 200 mL/hr over 30 Minutes Intravenous Every 24 hours 06/11/2020 1014          Assessment/Plan  Symptomatic Cholelithiasis with possible acute cholecystitis Elevated Lipase without meeting laboratory criteria for Pancreatitis  - RUQ Korea w/ cholelithiasis without gallbladder wall thickening or pericholecystitis fluid. He did have a +Murphy's signs when undergoing [place] which raised the suspicion for Cholecystitis. He not longer has a + Murphy's sign on my exam but is focally tender in the RUQ.  - WBC and LFT's wnl. Lipase is elevated at 108 but does not meet criteria for pancreatitis. CBD was wnl at 58mm on Korea. Repeat labs in AM.  - Agree with IV abx.  - No plans for surgery today. Okay for CLD - Patient scheduled for HD today. Recommend medical/cardiology clearance to determine patients risk for surgery. If he is high risk, may be reasonable to start with a trial of IV abx alone given his normal wbc, normal LFT's and he no longer has pain except with palpation.  - I reached out to [facility] and discussed recommendations  - We will follow along with you  FEN - CLD, IVF per primary VTE - SCDs, okay for chemical prophylaxis from a general surgery standpoint ID - Cefepime/Flagyl >>   COPD DM2 ESRD on HD T/T/S HLD HTN  Jillyn Ledger, [facility] 07/01/2020, 12:28 PM Please see Amion for pager number during day hours 7:00am-4:30pm

## 2020-06-20 NOTE — ED Provider Notes (Signed)
[facility] EMERGENCY DEPARTMENT Provider Note   CSN: [id number] Arrival date & time: 06/12/2020  9485     History Chief Complaint  Patient presents with   Emesis    Daniel Thomas is a 65 y.o. male presenting for evaluation of nausea, vomiting, abdominal bloating.  Patient states he was awoken from sleep at 2 AM this morning with nausea and vomiting.  He has associated abdominal bloating.  He has had intermittent chills and sweats.  He denies chest pain, increased shortness of breath from baseline, or change in bowel movements.  He has a history of chronic diarrhea, and has had 2 bowel movements this morning.  No headache or dizziness.  No recent sick contacts.  He did eat spicy tacos at 10 PM and lay down right afterwards.  He has a history of heartburn, but does not take anything for it.  He has not taken anything for his symptoms including Tums.  No abdominal surgeries.  He has a history of ESRD on dialysis, goes Tuesday, Thursday, Saturday.  Last session was on Thursday, was normal.  He is due for dialysis today.  He did take his medication at home prior to coming to the ER.  Patient states his symptoms are improving slightly, however he continues to have nausea and bloating.   Additional history taken chart review.  History of COPD, diabetes, ESRD on dialysis, hypertension, hyperlipidemia, enlarged prostate/elevated PSA.   HPI     Past Medical History:  Diagnosis Date   COPD (chronic obstructive pulmonary disease) ([place])    Diabetes mellitus without complication ([place])    Enlarged prostate    ESRD on dialysis ([place])    Heart murmur    High cholesterol    Hypertension     Patient Active Problem List   Diagnosis Date Noted   Acute cholecystitis 06/07/2020   DM (diabetes mellitus), type 2 with renal complications ([place]) 46/27/0350   Subdural hematoma ([place]) 04/28/2020   Hypertensive urgency 04/28/2020   Acute on chronic congestive heart failure ([place])     Essential hypertension    COPD exacerbation ([place]) 12/05/2019   Acute respiratory failure (H[facility])    SIRS (systemic inflammatory response syndrome) (H[facility])    Acute on chronic diastolic CHF (congestive heart failure) ([place])    Thyroid mass    Bacterial infection, unspecified 05/17/2019   Bacterial intestinal infection, unspecified 05/17/2019   Septicemia due to coagulase-negative staphylococcal infection ([place]) 05/14/2019   Severe sepsis ([place]) 05/12/2019   ESRD (end stage renal disease) ([place]) 05/12/2019   Leucocytosis 05/12/2019   Dialysis AV fistula malfunction ([place]) 04/17/2018   Anemia, unspecified 01/26/2018   Benign prostatic hyperplasia without lower urinary tract symptoms 01/16/2018   Chronic obstructive pulmonary disease, unspecified ([place]) 01/16/2018   Hyperkalemia 01/12/2018   Hypertensive emergency 01/06/2018    Past Surgical History:  Procedure Laterality Date   DIALYSIS/PERMA CATHETER INSERTION N/A 01/12/2018   Procedure: DIALYSIS/PERMA CATHETER INSERTION;  Surgeon: Katha Cabal, MD;  Location: [place] [facility];  Service: Cardiovascular;  Laterality: N/A;   DIALYSIS/PERMA CATHETER INSERTION N/A 05/16/2019   Procedure: DIALYSIS/PERMA CATHETER INSERTION;  Surgeon: Algernon Huxley, MD;  Location: [place] [facility];  Service: Cardiovascular;  Laterality: N/A;   DIALYSIS/PERMA CATHETER INSERTION N/A 10/17/2019   Procedure: DIALYSIS/PERMA CATHETER INSERTION;  Surgeon: Algernon Huxley, MD;  Location: [place];  Service: Cardiovascular;  Laterality: N/A;   DIALYSIS/PERMA CATHETER INSERTION N/A 12/10/2019   Procedure: DIALYSIS/PERMA CATHETER INSERTION;  Surgeon: Delana Meyer,  Dolores Lory, MD;  Location: [place] [facility];  Service: Cardiovascular;  Laterality: N/A;   DIALYSIS/PERMA CATHETER REMOVAL N/A 05/13/2019   Procedure: DIALYSIS/PERMA CATHETER REMOVAL;  Surgeon: Algernon Huxley, MD;  Location: [facility];  Service: Cardiovascular;  Laterality: N/A;   DIALYSIS/PERMA  CATHETER REMOVAL N/A 12/06/2019   Procedure: DIALYSIS/PERMA CATHETER REMOVAL;  Surgeon: Algernon Huxley, MD;  Location: [place] [facility];  Service: Cardiovascular;  Laterality: N/A;   TEE WITHOUT CARDIOVERSION N/A 05/15/2019   Procedure: TRANSESOPHAGEAL ECHOCARDIOGRAM (TEE);  Surgeon: Teodoro Spray, MD;  Location: [facility];  Service: Cardiovascular;  Laterality: N/A;       No family history on file.  Social History   Tobacco Use   Smoking status: Former    Years: 20.00    Pack years: 0.00    Types: Cigarettes   Smokeless tobacco: Never  Vaping Use   Vaping Use: Never used  Substance Use Topics   Alcohol use: Never   Drug use: Never    Home Medications Prior to Admission medications   Medication Sig Start Date End Date Taking? Authorizing Provider  albuterol (VENTOLIN HFA) 108 (90 Base) MCG/ACT inhaler Inhale 2 puffs into the lungs every 6 (six) hours as needed for wheezing or shortness of breath.    [provider]  amLODipine (NORVASC) 10 MG tablet Take 1 tablet (10 mg total) by mouth at bedtime. 12/05/19   Loletha Grayer, MD  atorvastatin (LIPITOR) 40 MG tablet Take 40 mg by mouth at bedtime.    [provider]  calcium carbonate (TUMS - DOSED IN MG ELEMENTAL CALCIUM) 500 MG chewable tablet Chew 1 tablet by mouth every 6 (six) hours as needed for indigestion.    [provider]  carvedilol (COREG) 12.5 MG tablet Take 12.5 mg by mouth 2 (two) times daily with a meal. 03/31/18   [provider]  citalopram (CELEXA) 20 MG tablet Take 20 mg by mouth daily.    [provider]  ergocalciferol (VITAMIN D2) 1.25 MG (50000 UT) capsule Take by mouth. Administered at analysis 02/02/18   [provider]  ferric citrate (AURYXIA) 1 GM 210 MG(Fe) tablet Take by mouth.  04/29/19   [provider]  fluticasone (FLONASE) 50 MCG/ACT nasal spray Place 1 spray into both nostrils daily. 04/27/20   [provider]  hydrALAZINE  (APRESOLINE) 25 MG tablet Take 1 tablet (25 mg total) by mouth every 8 (eight) hours. 05/17/19   Nolberto Hanlon, MD  lidocaine (LIDODERM) 5 % Place 1 patch onto the skin every 12 (twelve) hours. Remove & Discard patch within 12 hours or as directed by MD Patient not taking: Reported on 04/29/2020 11/10/19 11/09/20  Laban Emperor, PA-C  losartan (COZAAR) 50 MG tablet Take 1 tablet (50 mg total) by mouth at bedtime. 12/05/19   Loletha Grayer, MD  sevelamer carbonate (RENVELA) 800 MG tablet Take 1,600 mg by mouth 3 (three) times daily with meals. 12/23/19   [provider]  traZODone (DESYREL) 50 MG tablet Take 50 mg by mouth at bedtime.    [provider]    Allergies    Tape  Review of Systems   Review of Systems  Constitutional:  Positive for chills.  Gastrointestinal:  Positive for abdominal distention, diarrhea and vomiting.  All other systems reviewed and are negative.  Physical Exam Updated Vital Signs BP (!) 158/74 (BP Location: Right Arm)   Pulse 79   Temp 98.3 F (36.8 C) (Oral)  Resp 18   Ht 5\' 1"  (1.549 m)   Wt 72.6 kg   SpO2 100%   BMI 30.23 kg/m   Physical Exam Vitals and nursing note reviewed.  Constitutional:      General: He is not in acute distress.    Appearance: Normal appearance.     Comments: Appears nontoxic  HENT:     Head: Normocephalic and atraumatic.  Eyes:     Conjunctiva/sclera: Conjunctivae normal.     Pupils: Pupils are equal, round, and reactive to light.  Cardiovascular:     Rate and Rhythm: Normal rate and regular rhythm.     Pulses: Normal pulses.  Pulmonary:     Effort: Pulmonary effort is normal. No respiratory distress.     Breath sounds: Normal breath sounds. No wheezing.     Comments: Speaking in full sentences.  Clear lung sounds in all fields. Abdominal:     General: There is no distension.     Palpations: Abdomen is soft. There is no mass.     Tenderness: There is abdominal tenderness. There is no guarding or  rebound.     Comments: Mild ttp of the RUQ abd. No ttp elsewhere in the abd. No rigidity or distention. Negative rebound. No cva tenderness  Musculoskeletal:        General: Normal range of motion.     Cervical back: Normal range of motion and neck supple.     Right lower leg: No edema.     Left lower leg: No edema.  Skin:    General: Skin is warm and dry.     Capillary Refill: Capillary refill takes less than 2 seconds.  Neurological:     Mental Status: He is alert and oriented to person, place, and time.  Psychiatric:        Mood and Affect: Mood and affect normal.        Speech: Speech normal.        Behavior: Behavior normal.    ED Results / Procedures / Treatments   Labs (all labs ordered are listed, but only abnormal results are displayed) Labs Reviewed  CBC WITH DIFFERENTIAL/PLATELET - Abnormal; Notable for the following components:      Result Value   RBC 3.40 (*)    Hemoglobin 10.4 (*)    HCT 32.1 (*)    Neutro Abs 8.7 (*)    All other components within normal limits  COMPREHENSIVE METABOLIC PANEL - Abnormal; Notable for the following components:   Chloride 95 (*)    Glucose, Bld 156 (*)    BUN 45 (*)    Creatinine, Ser 9.62 (*)    Calcium 8.3 (*)    GFR, Estimated 6 (*)    Anion gap 16 (*)    All other components within normal limits  LIPASE, BLOOD - Abnormal; Notable for the following components:   Lipase 108 (*)    All other components within normal limits  TROPONIN I (HIGH SENSITIVITY) - Abnormal; Notable for the following components:   Troponin I (High Sensitivity) 60 (*)    All other components within normal limits  SARS CORONAVIRUS 2 (TAT 6-24 HRS)  TROPONIN I (HIGH SENSITIVITY)    EKG None  Radiology US Abdomen Limited RUQ (LIVER/GB)  Result Date: 06/28/2020 CLINICAL DATA:  65 year old with nausea and vomiting. EXAM: ULTRASOUND ABDOMEN LIMITED RIGHT UPPER QUADRANT COMPARISON:  01/15/2018 FINDINGS: Gallbladder: Gallbladder is mildly distended and  there are multiple small echogenic foci compatible with shadowing gallstones. Index stone  measures approximately 5 mm. Reportedly, the patient has a sonographic Murphy sign. There is no evidence for gallbladder wall thickening or pericholecystic fluid. Evidence for echogenic sludge. Common bile duct: Diameter: 3 mm Liver: No focal lesion identified. Within normal limits in parenchymal echogenicity. Portal vein is patent on color Doppler imaging with normal direction of blood flow towards the liver. Other: None. IMPRESSION: Gallbladder is mildly distended with cholelithiasis. Reportedly, the patient has a sonographic Murphy sign. Findings are concerning for acute cholecystitis. Electronically Signed   By: Markus Daft M.D.   On: 06/16/2020 08:44    Procedures Procedures   Medications Ordered in ED Medications  metroNIDAZOLE (FLAGYL) IVPB 500 mg (has no administration in time range)  ceFEPIme (MAXIPIME) 1 g in sodium chloride 0.9 % 100 mL IVPB (has no administration in time range)  ondansetron (ZOFRAN) injection 4 mg (4 mg Intravenous Given 06/16/2020 8032)    ED Course  I have reviewed the triage vital signs and the nursing notes.  Pertinent labs & imaging results that were available during my care of the patient were reviewed by me and considered in my medical decision making (see chart for details).    MDM Rules/Calculators/A&P                          Patient presented for evaluation nausea, vomiting, abdominal bloating.  On exam, patient is nontoxic.  Symptoms are improving slightly.  He did have spicy tacos last night does before laying down for bed.  Likely heartburn.  However considering his complex medical history, consider cardiac cause.  Consider cholelithiasis.  Consider viral GI illness versus gastritis.  Will obtain labs, ekg, right upper quadrant ultrasound, treat symptomatically and reassess.  Labs interpreted by me.  Elevated lipase at 108, consider pancreatitis versus gallstone  peritonitis.  Troponin is minimally elevated at 60, this is likely due to dialysis, however will trend.  No significant leukocytosis.  LFTs and bili normal.  Ultrasound shows multiple gallstones, and with right upper quadrant pain, there is concern for acute cholecystitis.  However there is no peri cholecystic fluid or gallbladder wall thickening.  In the setting of elevated lipase and gallstones, will consult with general surgery.  Discussed with Dr. Kieth Brightly  from general surgery, who will evaluate the patient.  Recommends medicine admit.  Discussed with Dr. Jonnie Finner from nephrology, who will consult on patient and set up dialysis as needed.  Discussed with Dr. Dione Plover from [facility] hospitalist service, patient to be admitted.  Final Clinical Impression(s) / ED Diagnoses Final diagnoses:  Nausea & vomiting  Gallstone pancreatitis    Rx / DC Orders ED Discharge Orders     None        Franchot Heidelberg, PA-C 06/23/2020 1015    Long, Wonda Olds, MD 06/22/20 2036

## 2020-06-20 NOTE — Progress Notes (Signed)
Pharmacy Antibiotic Note  Daniel Thomas is a 65 y.o. male admitted on 06/14/2020 with  acute cholecystitis .  Pharmacy has been consulted for cefepime dosing.  Plan: Cefepime 1g IV q24h --F/u nephrology plan, due for HD today per normal sched --Monitor renal function, clinical status, and antibiotic plan  Height: 5\' 1"  (154.9 cm) Weight: 72.6 kg (160 lb) IBW/kg (Calculated) : 52.3  Temp (24hrs), Avg:98.3 F (36.8 C), Min:98.3 F (36.8 C), Max:98.3 F (36.8 C)  Recent Labs  Lab 06/05/2020 0720  WBC 10.1  CREATININE 9.62*    Estimated Creatinine Clearance: 6.6 mL/min (A) (by C-G formula based on SCr of 9.62 mg/dL (H)).    Allergies  Allergen Reactions   Tape Dermatitis and Rash    Antimicrobials this admission: Cefepime 6/18 >> Flagyl 6/18 >>   Dose adjustments this admission: N/A  Thank you for allowing pharmacy to be a part of this patient's care.  Joetta Manners, PharmD, [facility] Clinical Pharmacist  Please check AMION for all [place] phone numbers After 10:00 PM, call [place] [phone]

## 2020-06-20 NOTE — ED Triage Notes (Signed)
Patient presents to ed via [facility] states he ate some spicy taco's last pm around 10 p 230 this am c/o nausea and vomited x 3. States he goes to dialysis T-TH-S, states he feels bloated however is able to pass gas and belch. States he doesn't produce urine and longer dialysis access right upper chest. Patient is alert and oriented.

## 2020-06-21 DIAGNOSIS — R112 Nausea with vomiting, unspecified: Secondary | ICD-10-CM

## 2020-06-21 LAB — CBC
HCT: 27.3 % — ABNORMAL LOW (ref 39.0–52.0)
Hemoglobin: 9.1 g/dL — ABNORMAL LOW (ref 13.0–17.0)
MCH: 30.8 pg (ref 26.0–34.0)
MCHC: 33.3 g/dL (ref 30.0–36.0)
MCV: 92.5 fL (ref 80.0–100.0)
Platelets: 293 10*3/uL (ref 150–400)
RBC: 2.95 MIL/uL — ABNORMAL LOW (ref 4.22–5.81)
RDW: 14.8 % (ref 11.5–15.5)
WBC: 17.5 10*3/uL — ABNORMAL HIGH (ref 4.0–10.5)
nRBC: 0 % (ref 0.0–0.2)

## 2020-06-21 LAB — GLUCOSE, CAPILLARY
Glucose-Capillary: 106 mg/dL — ABNORMAL HIGH (ref 70–99)
Glucose-Capillary: 114 mg/dL — ABNORMAL HIGH (ref 70–99)
Glucose-Capillary: 155 mg/dL — ABNORMAL HIGH (ref 70–99)
Glucose-Capillary: 100 mg/dL — ABNORMAL HIGH (ref 70–99)
Glucose-Capillary: 188 mg/dL — ABNORMAL HIGH (ref 70–99)

## 2020-06-21 LAB — COMPREHENSIVE METABOLIC PANEL
ALT: 22 U/L (ref 0–44)
AST: 20 U/L (ref 15–41)
Albumin: 3.2 g/dL — ABNORMAL LOW (ref 3.5–5.0)
Alkaline Phosphatase: 98 U/L (ref 38–126)
Anion gap: 9 (ref 5–15)
BUN: 14 mg/dL (ref 8–23)
CO2: 29 mmol/L (ref 22–32)
Calcium: 8.2 mg/dL — ABNORMAL LOW (ref 8.9–10.3)
Chloride: 95 mmol/L — ABNORMAL LOW (ref 98–111)
Creatinine, Ser: 4.54 mg/dL — ABNORMAL HIGH (ref 0.61–1.24)
GFR, Estimated: 14 mL/min — ABNORMAL LOW (ref >60)
Glucose, Bld: 123 mg/dL — ABNORMAL HIGH (ref 70–99)
Potassium: 3.7 mmol/L (ref 3.5–5.1)
Sodium: 133 mmol/L — ABNORMAL LOW (ref 135–145)
Total Bilirubin: 1 mg/dL (ref 0.3–1.2)
Total Protein: 6.7 g/dL (ref 6.5–8.1)

## 2020-06-21 LAB — SARS CORONAVIRUS 2 (TAT 6-24 HRS): SARS Coronavirus 2: NEGATIVE

## 2020-06-21 LAB — LIPASE, BLOOD: Lipase: 87 U/L — ABNORMAL HIGH (ref 11–51)

## 2020-06-21 MED ORDER — CINACALCET HCL 30 MG PO TABS
120.0000 mg | ORAL_TABLET | ORAL | Status: DC
  Filled 2020-06-21: qty 4

## 2020-06-21 MED ORDER — CALCITRIOL 0.25 MCG PO CAPS: 0.2500 ug | ORAL_CAPSULE | ORAL | Status: DC

## 2020-06-21 MED ORDER — HEPARIN SODIUM (PORCINE) 1000 UNIT/ML IJ SOLN
INTRAMUSCULAR | Status: AC
  Filled 2020-06-21: qty 4

## 2020-06-21 MED ORDER — PROSOURCE PLUS PO LIQD
30.0000 mL | Freq: Two times a day (BID) | ORAL | Status: DC
  Administered 2020-06-21 – 2020-06-22 (×3): 30 mL via ORAL
  Filled 2020-06-21 (×4): qty 30

## 2020-06-21 MED ORDER — DARBEPOETIN ALFA 60 MCG/0.3ML IJ SOSY
60.0000 ug | PREFILLED_SYRINGE | INTRAMUSCULAR | Status: DC
  Filled 2020-06-21: qty 0.3

## 2020-06-21 NOTE — Progress Notes (Signed)
[facility] Progress Note   Subjective:  Seen in room. No CP, dyspnea, abdominal pain, vomiting today. S/p HD overnight - did fine, net UF 1.4L.  Objective Vitals:   06/21/20 0028 06/21/20 0058 06/21/20 0118 06/21/20 0513  BP: (!) 101/41 138/65 (!) 143/77 128/75  Pulse: 84 84 96 95  Resp: 16 (!) 22 20 18   Temp:  98.4 F (36.9 C) 98.8 F (37.1 C) 98.3 F (36.8 C)  TempSrc:   Oral Oral  SpO2:    100%  Weight:  73 kg    Height:       Physical Exam General: Well appearing man, NAD Heart: RRR; no murmur Lungs: CTAB Abdomen: distended, but soft/non-tender Extremities: No LE edema Dialysis Access: TDC in R chest  Additional Objective Labs: Basic Metabolic Panel: Recent Labs  Lab 06/30/2020 0720 06/21/20 0233  NA 135 133*  K 4.4 3.7  CL 95* 95*  CO2 24 29  GLUCOSE 156* 123*  BUN 45* 14  CREATININE 9.62* 4.54*  CALCIUM 8.3* 8.2*   Liver Function Tests: Recent Labs  Lab 07/01/2020 0720 06/21/20 0233  AST 20 20  ALT 20 22  ALKPHOS 111 98  BILITOT 0.7 1.0  PROT 7.4 6.7  ALBUMIN 3.8 3.2*   Recent Labs  Lab 06/10/2020 0720 06/21/20 0233  LIPASE 108* 87*   CBC: Recent Labs  Lab 06/30/2020 0720 06/21/20 0233  WBC 10.1 17.5*  NEUTROABS 8.7*  --   HGB 10.4* 9.1*  HCT 32.1* 27.3*  MCV 94.4 92.5  PLT 335 293   Studies/Results: US Abdomen Limited RUQ (LIVER/GB)  Result Date: 06/23/2020 CLINICAL DATA:  65 year old with nausea and vomiting. EXAM: ULTRASOUND ABDOMEN LIMITED RIGHT UPPER QUADRANT COMPARISON:  01/15/2018 FINDINGS: Gallbladder: Gallbladder is mildly distended and there are multiple small echogenic foci compatible with shadowing gallstones. Index stone measures approximately 5 mm. Reportedly, the patient has a sonographic Murphy sign. There is no evidence for gallbladder wall thickening or pericholecystic fluid. Evidence for echogenic sludge. Common bile duct: Diameter: 3 mm Liver: No focal lesion identified. Within normal limits in  parenchymal echogenicity. Portal vein is patent on color Doppler imaging with normal direction of blood flow towards the liver. Other: None. IMPRESSION: Gallbladder is mildly distended with cholelithiasis. Reportedly, the patient has a sonographic Murphy sign. Findings are concerning for acute cholecystitis. Electronically Signed   By: Markus Daft M.D.   On: 06/03/2020 08:44    Medications:  ceFEPime (MAXIPIME) IV Stopped (06/11/2020 1141)   lactated ringers 100 mL/hr at 06/21/20 0457   methocarbamol (ROBAXIN) IV     metronidazole 100 mL/hr at 06/21/20 0600    amLODipine  10 mg Oral QHS   atorvastatin  40 mg Oral QHS   carvedilol  12.5 mg Oral BID WC   Chlorhexidine Gluconate Cloth  6 each Topical Q0600   citalopram  20 mg Oral Daily   enoxaparin (LOVENOX) injection  30 mg Subcutaneous Q24H   heparin sodium (porcine)       hydrALAZINE  25 mg Oral Q8H   insulin aspart  0-6 Units Subcutaneous TID WC   sevelamer carbonate  1,600 mg Oral TID WC   traZODone  25 mg Oral QHS    Dialysis Orders: TTS  [facility] - Dr Smith Mince)  4h 46min   70.5kg  400/500   2/2.25 bath  TDC  Hep 1000  - calc 0.2mcg PO q HD  - mircera 50 q4 last 5/31  - venofer 50 /wk  - sensipar 120mg   PO q HD  Assessment/Plan: 1. Abdominal pain/vomiting: Gallstone pancreatitis v. cholecystitis -> on Cefepime/Flagyl and general surgery following. Lipase improving. 2. ESRD: Continue HD per usual TTS sched -> next 6/21. 3. HTN/volume: BP controlled, no volume excess on exam. 4. Anemia: Hgb 9.1 - overdue for ESA -> will plan to dose with next HD. 5. Secondary hyperparathyroidism: Ca ok, Phos pending. Continue home binders/VDRA/sensipar. 6. Nutrition: Alb low - adding supplements.  Veneta Penton, PA-C 06/21/2020, 9:31 AM  [employer]

## 2020-06-21 NOTE — Progress Notes (Signed)
Daniel Thomas  HER:[id number] DOB: 09-07-1955 DOA: 06/05/2020 PCP: Marguerita Merles, MD    Brief Narrative:  65 year old with a history of ESRD, COPD, CHF, DM 2, and HTN who presented to the ED with intractable vomiting.  In the ED ER US revealed a mildly distended gallbladder with cholelithiasis and there was a positive Murphy sign.  Significant Events:  6/18 admit via ED  Consultants:  General surgery Nephrology  Code Status: FULL CODE  Antimicrobials:  Cefepime 6/18 > Flagyl 6/18 >  DVT prophylaxis: SCDs  Subjective: Vital signs stable.  Afebrile.  WBC trending upward.  Patient states he feels significantly better today.  He denies chest pain shortness of breath or abdominal pain.  He denies nausea or vomiting.  Assessment & Plan:  Acute cholecystitis /symptomatic cholelithiasis Much improved with medical therapy at this time -continue to monitor  Elevated lipase Clinical exam/history not convincing of a true pancreatitis -follow clinically  ESRD Ongoing dialysis per nephrology  DM2 CBG well controlled  HTN Blood pressure controlled  HLD Holding medical therapy at present  COPD Well compensated  Depression Continue home medical therapy  Family Communication: No family present at time of exam Status is: Inpatient  Remains inpatient appropriate because:Inpatient level of care appropriate due to severity of illness  Dispo: The patient is from: Home              Anticipated d/c is to: Home              Patient currently is not medically stable to d/c.   Difficult to place patient No    Objective: Blood pressure 128/75, pulse 95, temperature 98.3 F (36.8 C), temperature source Oral, resp. rate 18, height 5\' 1"  (1.549 m), weight 73 kg, SpO2 100 %.  Intake/Output Summary (Last 24 hours) at 06/21/2020 1120 Last data filed at 06/21/2020 0600 Gross per 24 hour  Intake 340 ml  Output 1456 ml  Net -1116 ml   Filed Weights   06/08/2020 1000 06/18/2020 2058  06/21/20 0058  Weight: 72.6 kg 75.1 kg 73 kg    Examination: General: No acute respiratory distress Lungs: Clear to auscultation bilaterally without wheezes or crackles Cardiovascular: Regular rate and rhythm without murmur gallop or rub normal S1 and S2 Abdomen: Nontender, nondistended, soft, bowel sounds positive, no rebound, no ascites, no appreciable mass Extremities: No significant cyanosis, clubbing, or edema bilateral lower extremities  CBC: Recent Labs  Lab 06/15/2020 0720 06/21/20 0233  WBC 10.1 17.5*  NEUTROABS 8.7*  --   HGB 10.4* 9.1*  HCT 32.1* 27.3*  MCV 94.4 92.5  PLT 335 856   Basic Metabolic Panel: Recent Labs  Lab 06/19/2020 0720 06/21/20 0233  NA 135 133*  K 4.4 3.7  CL 95* 95*  CO2 24 29  GLUCOSE 156* 123*  BUN 45* 14  CREATININE 9.62* 4.54*  CALCIUM 8.3* 8.2*   GFR: Estimated Creatinine Clearance: 14.1 mL/min (A) (by C-G formula based on SCr of 4.54 mg/dL (H)).  Liver Function Tests: Recent Labs  Lab 06/04/2020 0720 06/21/20 0233  AST 20 20  ALT 20 22  ALKPHOS 111 98  BILITOT 0.7 1.0  PROT 7.4 6.7  ALBUMIN 3.8 3.2*   Recent Labs  Lab 06/06/2020 0720 06/21/20 0233  LIPASE 108* 87*     HbA1C: Hgb A1c MFr Bld  Date/Time Value Ref Range Status  12/06/2019 03:57 AM 6.2 (H) 4.8 - 5.6 % Final    Comment:    (NOTE) Pre diabetes:  5.7%-6.4%  Diabetes:              >6.4%  Glycemic control for   <7.0% adults with diabetes   01/07/2018 02:11 PM 8.5 (H) 4.8 - 5.6 % Final    Comment:    (NOTE) Pre diabetes:          5.7%-6.4% Diabetes:              >6.4% Glycemic control for   <7.0% adults with diabetes     CBG: Recent Labs  Lab 06/29/2020 1823 06/26/2020 2026 06/21/20 0121 06/21/20 0748  GLUCAP 135* 139* 106* 114*    Recent Results (from the past 240 hour(s))  SARS CORONAVIRUS 2 (TAT 6-24 HRS) Nasopharyngeal Nasopharyngeal Swab     Status: None   Collection Time: 07/01/2020  9:32 AM   Specimen: Nasopharyngeal Swab   Result Value Ref Range Status   SARS Coronavirus 2 NEGATIVE NEGATIVE Final    Comment: (NOTE) SARS-CoV-2 target nucleic acids are NOT DETECTED.  The SARS-CoV-2 RNA is generally detectable in upper and lower respiratory specimens during the acute phase of infection. Negative results do not preclude SARS-CoV-2 infection, do not rule out co-infections with other pathogens, and should not be used as the sole basis for treatment or other patient management decisions. Negative results must be combined with clinical observations, patient history, and epidemiological information. The expected result is Negative.  Fact Sheet for Patients: SugarRoll.be  Fact Sheet for Healthcare Providers: https://www.woods-mathews.com/  This test is not yet approved or cleared by the [place] FDA and  has been authorized for detection and/or diagnosis of SARS-CoV-2 by FDA under an Emergency Use Authorization (EUA). This EUA will remain  in effect (meaning this test can be used) for the duration of the COVID-19 declaration under Se ction 564(b)(1) of the Act, 21 U.S.C. section 360bbb-3(b)(1), unless the authorization is terminated or revoked sooner.  Performed at [facility], [place] [address] [place], [place] [address]      Scheduled Meds:  (feeding supplement) PROSource Plus  30 mL Oral BID BM   amLODipine  10 mg Oral QHS   atorvastatin  40 mg Oral QHS   [START ON 06/23/2020] calcitRIOL  0.25 mcg Oral Once per day on Tue Thu Sat   carvedilol  12.5 mg Oral BID WC   Chlorhexidine Gluconate Cloth  6 each Topical Q0600   [START ON 06/23/2020] cinacalcet  120 mg Oral Once per day on Tue Thu Sat   citalopram  20 mg Oral Daily   [START ON 06/23/2020] darbepoetin (ARANESP) injection - DIALYSIS  60 mcg Intravenous Q Tue-HD   enoxaparin (LOVENOX) injection  30 mg Subcutaneous Q24H   heparin sodium (porcine)       hydrALAZINE  25 mg Oral Q8H   insulin aspart   0-6 Units Subcutaneous TID WC   sevelamer carbonate  1,600 mg Oral TID WC   traZODone  25 mg Oral QHS   Continuous Infusions:  ceFEPime (MAXIPIME) IV 1 g (06/21/20 1027)   lactated ringers 100 mL/hr at 06/21/20 0457   methocarbamol (ROBAXIN) IV     metronidazole 500 mg (06/21/20 1030)     LOS: 1 day   Cherene Altes, MD [facility] Office  [phone] Pager - Text Page per Amion  If 7PM-7AM, please contact night-coverage per Amion 06/21/2020, 11:20 AM

## 2020-06-22 ENCOUNTER — Inpatient Hospital Stay (HOSPITAL_COMMUNITY): Payer: Medicare Other

## 2020-06-22 DIAGNOSIS — Z01818 Encounter for other preprocedural examination: Secondary | ICD-10-CM

## 2020-06-22 DIAGNOSIS — R1114 Bilious vomiting: Secondary | ICD-10-CM

## 2020-06-22 LAB — COMPREHENSIVE METABOLIC PANEL
ALT: 16 U/L (ref 0–44)
ALT: 138 U/L — ABNORMAL HIGH (ref 0–44)
AST: 17 U/L (ref 15–41)
AST: 275 U/L — ABNORMAL HIGH (ref 15–41)
Albumin: 2.9 g/dL — ABNORMAL LOW (ref 3.5–5.0)
Albumin: 2.5 g/dL — ABNORMAL LOW (ref 3.5–5.0)
Alkaline Phosphatase: 87 U/L (ref 38–126)
Alkaline Phosphatase: 107 U/L (ref 38–126)
Anion gap: 13 (ref 5–15)
Anion gap: 19 — ABNORMAL HIGH (ref 5–15)
BUN: 30 mg/dL — ABNORMAL HIGH (ref 8–23)
BUN: 45 mg/dL — ABNORMAL HIGH (ref 8–23)
CO2: 23 mmol/L (ref 22–32)
CO2: 19 mmol/L — ABNORMAL LOW (ref 22–32)
Calcium: 7.7 mg/dL — ABNORMAL LOW (ref 8.9–10.3)
Calcium: 9.4 mg/dL (ref 8.9–10.3)
Chloride: 92 mmol/L — ABNORMAL LOW (ref 98–111)
Chloride: 86 mmol/L — ABNORMAL LOW (ref 98–111)
Creatinine, Ser: 7.3 mg/dL — ABNORMAL HIGH (ref 0.61–1.24)
Creatinine, Ser: 9.12 mg/dL — ABNORMAL HIGH (ref 0.61–1.24)
GFR, Estimated: 8 mL/min — ABNORMAL LOW (ref >60)
GFR, Estimated: 6 mL/min — ABNORMAL LOW (ref >60)
Glucose, Bld: 107 mg/dL — ABNORMAL HIGH (ref 70–99)
Glucose, Bld: 238 mg/dL — ABNORMAL HIGH (ref 70–99)
Potassium: 4 mmol/L (ref 3.5–5.1)
Potassium: 4.8 mmol/L (ref 3.5–5.1)
Sodium: 128 mmol/L — ABNORMAL LOW (ref 135–145)
Sodium: 124 mmol/L — ABNORMAL LOW (ref 135–145)
Total Bilirubin: 1.1 mg/dL (ref 0.3–1.2)
Total Bilirubin: 1 mg/dL (ref 0.3–1.2)
Total Protein: 6.2 g/dL — ABNORMAL LOW (ref 6.5–8.1)
Total Protein: 5.5 g/dL — ABNORMAL LOW (ref 6.5–8.1)

## 2020-06-22 LAB — CBC WITH DIFFERENTIAL/PLATELET
Abs Immature Granulocytes: 1.1 10*3/uL — ABNORMAL HIGH (ref 0.00–0.07)
Basophils Absolute: 0.1 10*3/uL (ref 0.0–0.1)
Basophils Relative: 0 %
Eosinophils Absolute: 0 10*3/uL (ref 0.0–0.5)
Eosinophils Relative: 0 %
HCT: 26.5 % — ABNORMAL LOW (ref 39.0–52.0)
Hemoglobin: 8.5 g/dL — ABNORMAL LOW (ref 13.0–17.0)
Immature Granulocytes: 4 %
Lymphocytes Relative: 13 %
Lymphs Abs: 3.9 10*3/uL (ref 0.7–4.0)
MCH: 31.4 pg (ref 26.0–34.0)
MCHC: 32.1 g/dL (ref 30.0–36.0)
MCV: 97.8 fL (ref 80.0–100.0)
Monocytes Absolute: 1.9 10*3/uL — ABNORMAL HIGH (ref 0.1–1.0)
Monocytes Relative: 6 %
Neutro Abs: 22.4 10*3/uL — ABNORMAL HIGH (ref 1.7–7.7)
Neutrophils Relative %: 77 %
Platelets: 194 10*3/uL (ref 150–400)
RBC: 2.71 MIL/uL — ABNORMAL LOW (ref 4.22–5.81)
RDW: 14.6 % (ref 11.5–15.5)
WBC: 29.3 10*3/uL — ABNORMAL HIGH (ref 4.0–10.5)
nRBC: 0.1 % (ref 0.0–0.2)

## 2020-06-22 LAB — POCT I-STAT 7, (LYTES, BLD GAS, ICA,H+H)
Acid-base deficit: 1 mmol/L (ref 0.0–2.0)
Bicarbonate: 22.8 mmol/L (ref 20.0–28.0)
Calcium, Ion: 1.14 mmol/L — ABNORMAL LOW (ref 1.15–1.40)
HCT: 25 % — ABNORMAL LOW (ref 39.0–52.0)
Hemoglobin: 8.5 g/dL — ABNORMAL LOW (ref 13.0–17.0)
O2 Saturation: 100 %
Patient temperature: 98.6
Potassium: 4.9 mmol/L (ref 3.5–5.1)
Sodium: 122 mmol/L — ABNORMAL LOW (ref 135–145)
TCO2: 24 mmol/L (ref 22–32)
pCO2 arterial: 35.4 mmHg (ref 32.0–48.0)
pH, Arterial: 7.417 (ref 7.350–7.450)
pO2, Arterial: 442 mmHg — ABNORMAL HIGH (ref 83.0–108.0)

## 2020-06-22 LAB — CBC
HCT: 26.1 % — ABNORMAL LOW (ref 39.0–52.0)
Hemoglobin: 8.6 g/dL — ABNORMAL LOW (ref 13.0–17.0)
MCH: 30.8 pg (ref 26.0–34.0)
MCHC: 33 g/dL (ref 30.0–36.0)
MCV: 93.5 fL (ref 80.0–100.0)
Platelets: 283 10*3/uL (ref 150–400)
RBC: 2.79 MIL/uL — ABNORMAL LOW (ref 4.22–5.81)
RDW: 14.5 % (ref 11.5–15.5)
WBC: 25.8 10*3/uL — ABNORMAL HIGH (ref 4.0–10.5)
nRBC: 0 % (ref 0.0–0.2)

## 2020-06-22 LAB — GLUCOSE, CAPILLARY
Glucose-Capillary: 145 mg/dL — ABNORMAL HIGH (ref 70–99)
Glucose-Capillary: 126 mg/dL — ABNORMAL HIGH (ref 70–99)
Glucose-Capillary: 119 mg/dL — ABNORMAL HIGH (ref 70–99)
Glucose-Capillary: 207 mg/dL — ABNORMAL HIGH (ref 70–99)

## 2020-06-22 LAB — LIPASE, BLOOD: Lipase: 40 U/L (ref 11–51)

## 2020-06-22 LAB — LACTIC ACID, PLASMA: Lactic Acid, Venous: 8.1 mmol/L (ref 0.5–1.9)

## 2020-06-22 LAB — TROPONIN I (HIGH SENSITIVITY): Troponin I (High Sensitivity): 2384 ng/L (ref <18)

## 2020-06-22 MED ORDER — IOHEXOL 9 MG/ML PO SOLN
500.0000 mL | ORAL | Status: AC
  Administered 2020-06-22: 500 mL via ORAL

## 2020-06-22 MED ORDER — METHYLPREDNISOLONE SODIUM SUCC 125 MG IJ SOLR
INTRAMUSCULAR | Status: AC
  Filled 2020-06-22: qty 2

## 2020-06-22 MED ORDER — PROPOFOL 1000 MG/100ML IV EMUL
0.0000 ug/kg/min | INTRAVENOUS | Status: DC
  Administered 2020-06-23 (×2): 40 ug/kg/min via INTRAVENOUS
  Administered 2020-06-23: 45 ug/kg/min via INTRAVENOUS
  Administered 2020-06-23: 5 ug/kg/min via INTRAVENOUS
  Administered 2020-06-23 – 2020-06-24 (×2): 35 ug/kg/min via INTRAVENOUS
  Administered 2020-06-24: 40 ug/kg/min via INTRAVENOUS
  Administered 2020-06-24: 45 ug/kg/min via INTRAVENOUS
  Administered 2020-06-24: 50 ug/kg/min via INTRAVENOUS
  Administered 2020-06-24: 35 ug/kg/min via INTRAVENOUS
  Administered 2020-06-25: 50 ug/kg/min via INTRAVENOUS
  Filled 2020-06-22 (×9): qty 100
  Filled 2020-06-22: qty 200
  Filled 2020-06-22: qty 100

## 2020-06-22 MED ORDER — IOHEXOL 300 MG/ML  SOLN
100.0000 mL | Freq: Once | INTRAMUSCULAR | Status: AC | PRN
  Administered 2020-06-22: 100 mL via INTRAVENOUS

## 2020-06-22 MED ORDER — DOCUSATE SODIUM 50 MG/5ML PO LIQD
100.0000 mg | Freq: Two times a day (BID) | ORAL | Status: DC
  Administered 2020-06-23 – 2020-07-02 (×15): 100 mg
  Filled 2020-06-22 (×15): qty 10

## 2020-06-22 MED ORDER — NOREPINEPHRINE 4 MG/250ML-% IV SOLN
INTRAVENOUS | Status: AC
  Administered 2020-06-22: 4 mg
  Filled 2020-06-22: qty 250

## 2020-06-22 MED ORDER — POLYETHYLENE GLYCOL 3350 17 G PO PACK
17.0000 g | PACK | Freq: Every day | ORAL | Status: DC
  Administered 2020-06-24 – 2020-06-27 (×4): 17 g
  Filled 2020-06-22 (×4): qty 1

## 2020-06-22 MED ORDER — SODIUM BICARBONATE 8.4 % IV SOLN
INTRAVENOUS | Status: AC
  Filled 2020-06-22: qty 100

## 2020-06-22 MED ORDER — FAMOTIDINE IN NACL 20-0.9 MG/50ML-% IV SOLN
20.0000 mg | Freq: Two times a day (BID) | INTRAVENOUS | Status: DC
  Administered 2020-06-23 – 2020-06-25 (×7): 20 mg via INTRAVENOUS
  Filled 2020-06-22 (×7): qty 50

## 2020-06-22 MED ORDER — FENTANYL CITRATE (PF) 100 MCG/2ML IJ SOLN
50.0000 ug | INTRAMUSCULAR | Status: DC | PRN
  Administered 2020-06-25 (×2): 50 ug via INTRAVENOUS
  Filled 2020-06-22 (×2): qty 2

## 2020-06-22 MED ORDER — DEXTROSE-NACL 5-0.45 % IV SOLN: INTRAVENOUS | Status: DC

## 2020-06-22 MED ORDER — FENTANYL CITRATE (PF) 100 MCG/2ML IJ SOLN
50.0000 ug | INTRAMUSCULAR | Status: DC | PRN
  Administered 2020-06-23 – 2020-06-26 (×6): 100 ug via INTRAVENOUS
  Administered 2020-06-27: 50 ug via INTRAVENOUS
  Filled 2020-06-22 (×7): qty 2

## 2020-06-22 MED ORDER — EPINEPHRINE 1 MG/10ML IJ SOSY
PREFILLED_SYRINGE | INTRAMUSCULAR | Status: AC
  Filled 2020-06-22: qty 30

## 2020-06-22 MED ORDER — CALCIUM CHLORIDE 10 % IV SOLN
INTRAVENOUS | Status: AC
  Filled 2020-06-22: qty 10

## 2020-06-22 MED ORDER — METHYLPREDNISOLONE SODIUM SUCC 125 MG IJ SOLR
60.0000 mg | Freq: Two times a day (BID) | INTRAMUSCULAR | Status: DC
  Administered 2020-06-23 – 2020-06-26 (×7): 60 mg via INTRAVENOUS
  Filled 2020-06-22 (×8): qty 2

## 2020-06-22 MED ORDER — IPRATROPIUM-ALBUTEROL 0.5-2.5 (3) MG/3ML IN SOLN: 3.0000 mL | RESPIRATORY_TRACT | Status: DC | PRN

## 2020-06-22 MED ORDER — DIPHENHYDRAMINE HCL 50 MG/ML IJ SOLN
25.0000 mg | Freq: Four times a day (QID) | INTRAMUSCULAR | Status: DC
  Administered 2020-06-23 – 2020-06-26 (×13): 25 mg via INTRAVENOUS
  Filled 2020-06-22 (×13): qty 1

## 2020-06-22 MED ORDER — EPINEPHRINE HCL 5 MG/250ML IV SOLN IN NS
0.5000 ug/min | INTRAVENOUS | Status: DC
Start: 2020-06-23 — End: 2020-06-24
  Administered 2020-06-22: 1 ug/min via INTRAVENOUS
  Administered 2020-06-23: 20 ug/min via INTRAVENOUS
  Administered 2020-06-23: 7 ug/min via INTRAVENOUS
  Administered 2020-06-23: 20 ug/min via INTRAVENOUS
  Filled 2020-06-22 (×2): qty 250

## 2020-06-22 NOTE — Progress Notes (Signed)
[facility] Progress Note   Subjective:     Patient sen and examined today at bedside. No complaints. Denies SOB, CP, ABD pain, N/V/D. Plan for HD tomorrow 06/23/20.   Objective Vitals:   06/21/20 1429 06/21/20 1430 06/21/20 2105 06/22/20 0453  BP: (!) 141/76  134/72 (!) 145/69  Pulse: 100  84 80  Resp: 16  18 18   Temp: 99.6 F (37.6 C) 99.6 F (37.6 C) 98.9 F (37.2 C) 98.2 F (36.8 C)  TempSrc: Oral Oral  Oral  SpO2: 100%  98%   Weight:      Height:       Physical Exam General: Well developed male; No acute respiratory distress Heart: Normal S1 and S2; No murmurs, gallops, or rubs Lungs: Clear throughout; No whezzing, rales, or rhonchi Abdomen: Round, distended but soft, active bowel sounds Extremities: No edema bilateral lower extremities Dialysis Access: [facility] R chest   Filed Weights   06/16/2020 1000 06/06/2020 2058 06/21/20 0058  Weight: 72.6 kg 75.1 kg 73 kg    Intake/Output Summary (Last 24 hours) at 06/22/2020 0937 Last data filed at 06/21/2020 2300 Gross per 24 hour  Intake 840 ml  Output 0 ml  Net 840 ml    Additional Objective Labs: Basic Metabolic Panel: Recent Labs  Lab 06/25/2020 0720 06/21/20 0233 06/22/20 0103  NA 135 133* 128*  K 4.4 3.7 4.0  CL 95* 95* 92*  CO2 24 29 23   GLUCOSE 156* 123* 107*  BUN 45* 14 30*  CREATININE 9.62* 4.54* 7.30*  CALCIUM 8.3* 8.2* 7.7*   Liver Function Tests: Recent Labs  Lab 06/19/2020 0720 06/21/20 0233 06/22/20 0103  AST 20 20 17   ALT 20 22 16   ALKPHOS 111 98 87  BILITOT 0.7 1.0 1.1  PROT 7.4 6.7 6.2*  ALBUMIN 3.8 3.2* 2.9*   Recent Labs  Lab 06/25/2020 0720 06/21/20 0233 06/22/20 0103  LIPASE 108* 87* 40   CBC: Recent Labs  Lab 06/06/2020 0720 06/21/20 0233 06/22/20 0103  WBC 10.1 17.5* 25.8*  NEUTROABS 8.7*  --   --   HGB 10.4* 9.1* 8.6*  HCT 32.1* 27.3* 26.1*  MCV 94.4 92.5 93.5  PLT 335 293 283   Blood Culture    Component Value Date/Time   SDES BLOOD RIGHT ANTECUBITAL  12/09/2019 0842   SPECREQUEST  12/09/2019 0842    BOTTLES DRAWN AEROBIC AND ANAEROBIC Blood Culture adequate volume   CULT  12/09/2019 0842    NO GROWTH 5 DAYS Performed at [facility], [address] [place], [place] [address]    REPTSTATUS 12/14/2019 FINAL 12/09/2019 0842    Cardiac Enzymes: No results for input(s): CKTOTAL, CKMB, CKMBINDEX, TROPONINI in the last 168 hours. CBG: Recent Labs  Lab 06/21/20 0748 06/21/20 1225 06/21/20 1642 06/21/20 2111 06/22/20 0734  GLUCAP 114* 155* 100* 188* 145*   Iron Studies: No results for input(s): IRON, TIBC, TRANSFERRIN, FERRITIN in the last 72 hours. Lab Results  Component Value Date   INR 1.1 12/05/2019   INR 1.1 05/12/2019   Studies/Results: No results found.  Medications:  ceFEPime (MAXIPIME) IV 1 g (06/21/20 1027)   lactated ringers 10 mL/hr at 06/21/20 1213   methocarbamol (ROBAXIN) IV     metronidazole 500 mg (06/22/20 0922)    (feeding supplement) PROSource Plus  30 mL Oral BID BM   amLODipine  10 mg Oral QHS   [START ON 06/23/2020] calcitRIOL  0.25 mcg Oral Once per day on Tue Thu Sat  carvedilol  12.5 mg Oral BID WC   Chlorhexidine Gluconate Cloth  6 each Topical Q0600   [START ON 06/23/2020] cinacalcet  120 mg Oral Once per day on Tue Thu Sat   citalopram  20 mg Oral Daily   [START ON 06/23/2020] darbepoetin (ARANESP) injection - DIALYSIS  60 mcg Intravenous Q Tue-HD   enoxaparin (LOVENOX) injection  30 mg Subcutaneous Q24H   hydrALAZINE  25 mg Oral Q8H   insulin aspart  0-6 Units Subcutaneous TID WC   sevelamer carbonate  1,600 mg Oral TID WC   traZODone  25 mg Oral QHS    Dialysis Orders: TTS [facility] - Dr Smith Mince)  4h 73min   70.5kg  400/500   2/2.25 bath  TDC  Hep 1000  - calc 0.40mcg PO q HD  - mircera 50 q4 last 5/31  - venofer 50 /wk  - sensipar 120mg  PO q HD  Assessment/Plan: 1. Abdominal pain/vomiting: Gallstone pancreatitis v. cholecystitis -> on Cefepime/Flagyl and  general surgery following. Lipase improving.  2. ESRD: Continue HD per usual TTS sched -> next 6/21-spoke with Surgery briefly-plan for possible percutaneous drain placement tomorrow-if this happens, will coordinate with HD to ensure patient receives HD treatment in the morning. 3. HTN/volume: BP controlled, no volume excess on exam. 4. Anemia: Hgb now 8.6 - overdue for ESA -> dose scheduled with HD 6/21. 5. Secondary hyperparathyroidism: Ca ok, Phos pending. Continue home binders/VDRA/sensipar. 6. Nutrition: Alb low - adding supplements.  Tobie Poet, NP [facility] 06/22/2020,9:37 AM  LOS: 2 days

## 2020-06-22 NOTE — Progress Notes (Signed)
Pt with myoclonic jerking activity noted. MD aware of activity.

## 2020-06-22 NOTE — Procedures (Signed)
Arterial Catheter Insertion Procedure Note  GABRIEL CONRY  [id number]  1955-02-27  Date:06/22/20  Time:11:47 PM    Provider Performing: Carrington Clamp A    Procedure: Insertion of Arterial Line [id number]) without US guidance  Indication(s) Blood pressure monitoring and/or need for frequent ABGs  Consent Unable to obtain consent due to emergent nature of procedure.  Anesthesia None   Time Out Verified patient identification, verified procedure, site/side was marked, verified correct patient position, special equipment/implants available, medications/allergies/relevant history reviewed, required imaging and test results available.   Sterile Technique Maximal sterile technique including full sterile barrier drape, hand hygiene, sterile gown, sterile gloves, mask, hair covering, sterile ultrasound probe cover (if used).   Procedure Description Area of catheter insertion was cleaned with chlorhexidine and draped in sterile fashion. Without real-time ultrasound guidance an arterial catheter was placed into the left radial artery.  Appropriate arterial tracings confirmed on monitor.     Complications/Tolerance None; patient tolerated the procedure well.   EBL Minimal   Specimen(s) None

## 2020-06-22 NOTE — Progress Notes (Signed)
After seeing Mr. Daniel Neas, RN informed me that his family had a question regarding his hemodialysis catheter. Returned back to the patient's room. The family informed me that [name] had a history of infections from his hemodialysis catheter and was inquiring whether his [facility] is causing his WBC to be elevated. IV team RN and myself were at bedside where she was able to remove the dressing so I can assess. TDC site was clean and dry with no evidence of erythema, swelling, foul order, or purulent drainage. New dressing was applied by IV team RN. I discussed with family that I do not suspect infection with the [facility] at this time. We will continue to monitor his CBC and TDC site during scheduled HD sessions.   Tobie Poet, NP

## 2020-06-22 NOTE — Consult Note (Signed)
NAME:  QUANTRELL SPLITT, MRN:  [id number], DOB:  05/02/1955, LOS: 2 ADMISSION DATE:  06/29/2020, CONSULTATION DATE:  6/20 REFERRING MD:  Dr. Thereasa Solo, CHIEF COMPLAINT:  N/V; Post Cardiac Arrest   History of Present Illness:  Patient is a 65 yo M with pertinent PMH of COPD, ESRD on dialysis, DM, Heart murmur, HTN, and hyperlipidemia presenting to [facility] on 6/18 with N/V.  Patient arrived to [facility] on 6/18 stating his N/V began yesterday (6/17) during the night. He has not missed any dialysis treatments except for today. Pertinent ED labs show Creatinine of 9.6, Lipase 108. Nephrology consulted for ESRD. RUQ ultrasound show cholithiasis and patient has positive Murphy's sign. Surgery consulted. Patient declined surgery and perc tube placement at this time.   On 6/20, patient underwent CT abdomen with IV/oral contrast to evaluate gallbladder. While in CT patient lost pulse and cardiac arrested for 30 minutes before ROSC. Patient tongue appeared swollen and required intubation with a 6.5 ET tube due to the swelling. Epi, steroids, calcium, mag, and bicarb were given. One shock was given during one intermission with V-fib on rhythm check.   PCCM consulted on 6/20 for post cardiac arrest care and mechanical ventilator/airway management.  Pertinent  Medical History   Past Medical History:  Diagnosis Date   COPD (chronic obstructive pulmonary disease) ([place])    Diabetes mellitus without complication ([place])    Enlarged prostate    ESRD on dialysis ([place])    Heart murmur    High cholesterol    Hypertension      Significant Hospital Events: Including procedures, antibiotic start and stop dates in addition to other pertinent events   6/18: admitted to [facility] with symptomatic cholelithiasis 6/20: CT abdomen with oral contrast ordered; patient cardiac arrested x30 minutes until ROSC and appeared anaphylactic from contrast with swollen tongue. Intubated with 6.5 ETT. Admitted to ICU. Patient went bradycardic and  cardiac arrested again and given 1 atropine, 1 epi, and bicarb given; ROSC after 2 mintues. Myoclonic activity: EEG pending.  Interim History / Subjective:  Patient intubated with 6.5 ETT on mech ventilation On Epi drip  Objective   Blood pressure 101/63, pulse 86, temperature 99.9 F (37.7 C), temperature source Oral, resp. rate 18, height 5\' 1"  (1.549 m), weight 73 kg, SpO2 95 %.        Intake/Output Summary (Last 24 hours) at 06/22/2020 2158 Last data filed at 06/22/2020 2111 Gross per 24 hour  Intake 395.03 ml  Output 0 ml  Net 395.03 ml   Filed Weights   06/16/2020 1000 06/24/2020 2058 06/21/20 0058  Weight: 72.6 kg 75.1 kg 73 kg    Examination: General:  critically ill appearing intubated patient HEENT: MM pink/moist Neuro: non responsive; pupils pin point; possible myoclonic jerking CV: s1s2, RRR PULM:  dim clear BS bilaterally GI: soft, bsx4 active  Extremities: warm/dry, no edema  Skin: no rashes or lesions   Labs/imaging that I havepersonally reviewed  (right click and "Reselect all SmartList Selections" daily)   Creatinine 7.3 WBC 25.8 (from 17.5) Hb 8.5 (from 9.1)  Chest/abdomen X-ray pending EEG pending  Resolved Hospital Problem list     Assessment & Plan:   S/p cardiac arrest 6/20: 30 minutes ROSC  Anaphylactic shock: tongue swelling after oral contrast 8/76 Acute Metabolic Encephalopathy P: -IV soludmedrol, benadryl, and pepcid -Wean Epi for MAP >65, but do not wean off. Keep on low dose for anaphylaxis.  -ICU admission with continuous telemetry -Will place A-line and central line -  EEG ordered -limit sedation for RASS 0 to -1  Acute respiratory failure s/p anaphylactic cardiac arrest: Intubated with 6.5 ETT due to swelling Hx of COPD P: -Continue mech vent 8 cc/kg -wean fio2 for sats >90% -Daily SBTs -Duoneb prn  Symptomatic Cholelithiasis w/ possible acute Cholecystitis Elevated lipase (improving) P: -Surgery following: patient/family  declined perc chole and lap chole -Continue IV antibiotic (Cefepime/Flagyl)  ESRD with hx of HTN Anemia P: -Nephro following: continue iHD -Trend BMP and CBC -hold home BP meds; consider restarting as BP allows  DM2 P: -SSI and CBG monitoring  Best Practice (right click and "Reselect all SmartList Selections" daily)   Diet/type: NPO Pain/Anxiety/Delirium protocol RASS goal: 0 VAP protocol (if indicated): Yes DVT prophylaxis: LMWH GI prophylaxis: H2B Glucose control:  SSI Central venous access:  Yes, and it is still needed Arterial line:  Yes, and it is still needed Foley:  N/A Mobility:  bed rest  PT consulted: N/A Studies pending: EEG Culture data pending:none Last reviewed culture data:today Antibiotics:cefepime and flagyl  Antibiotic de-escalation: no,  continue current rx Stop date: to be determined  Daily labs: ordered Code Status:  full code Last date of multidisciplinary goals of care discussion [pending] ccm prognosis: Serious Disposition: remains critically ill, will stay in intensive care        Labs   CBC: Recent Labs  Lab 06/30/2020 0720 06/21/20 0233 06/22/20 0103  WBC 10.1 17.5* 25.8*  NEUTROABS 8.7*  --   --   HGB 10.4* 9.1* 8.6*  HCT 32.1* 27.3* 26.1*  MCV 94.4 92.5 93.5  PLT 335 293 425    Basic Metabolic Panel: Recent Labs  Lab 06/04/2020 0720 06/21/20 0233 06/22/20 0103  NA 135 133* 128*  K 4.4 3.7 4.0  CL 95* 95* 92*  CO2 24 29 23   GLUCOSE 156* 123* 107*  BUN 45* 14 30*  CREATININE 9.62* 4.54* 7.30*  CALCIUM 8.3* 8.2* 7.7*   GFR: Estimated Creatinine Clearance: 8.8 mL/min (A) (by C-G formula based on SCr of 7.3 mg/dL (H)). Recent Labs  Lab 06/24/2020 0720 06/21/20 0233 06/22/20 0103  WBC 10.1 17.5* 25.8*    Liver Function Tests: Recent Labs  Lab 06/26/2020 0720 06/21/20 0233 06/22/20 0103  AST 20 20 17   ALT 20 22 16   ALKPHOS 111 98 87  BILITOT 0.7 1.0 1.1  PROT 7.4 6.7 6.2*  ALBUMIN 3.8 3.2* 2.9*   Recent  Labs  Lab 06/27/2020 0720 06/21/20 0233 06/22/20 0103  LIPASE 108* 87* 40   No results for input(s): AMMONIA in the last 168 hours.  ABG    Component Value Date/Time   PHART 7.30 (L) 12/05/2019 0730   PCO2ART 39 12/05/2019 0730   PO2ART 44 (L) 12/05/2019 0730   HCO3 19.2 (L) 12/05/2019 0730   ACIDBASEDEF 6.7 (H) 12/05/2019 0730   O2SAT 74.0 12/05/2019 0730     Coagulation Profile: No results for input(s): INR, PROTIME in the last 168 hours.  Cardiac Enzymes: No results for input(s): CKTOTAL, CKMB, CKMBINDEX, TROPONINI in the last 168 hours.  HbA1C: Hgb A1c MFr Bld  Date/Time Value Ref Range Status  12/06/2019 03:57 AM 6.2 (H) 4.8 - 5.6 % Final    Comment:    (NOTE) Pre diabetes:          5.7%-6.4%  Diabetes:              >6.4%  Glycemic control for   <7.0% adults with diabetes   01/07/2018 02:11 PM 8.5 (H) 4.8 -  5.6 % Final    Comment:    (NOTE) Pre diabetes:          5.7%-6.4% Diabetes:              >6.4% Glycemic control for   <7.0% adults with diabetes     CBG: Recent Labs  Lab 06/21/20 1642 06/21/20 2111 06/22/20 0734 06/22/20 1147 06/22/20 1653  GLUCAP 100* 188* 145* 126* 119*    Review of Systems:   Unable to obtain as patient is intubated on vent; obtained from chart and bedside nurse  Past Medical History:  He,  has a past medical history of COPD (chronic obstructive pulmonary disease) ([place]), Diabetes mellitus without complication ([place]), Enlarged prostate, ESRD on dialysis ([place]), Heart murmur, High cholesterol, and Hypertension.   Surgical History:   Past Surgical History:  Procedure Laterality Date   DIALYSIS/PERMA CATHETER INSERTION N/A 01/12/2018   Procedure: DIALYSIS/PERMA CATHETER INSERTION;  Surgeon: Katha Cabal, MD;  Location: [place] [facility];  Service: Cardiovascular;  Laterality: N/A;   DIALYSIS/PERMA CATHETER INSERTION N/A 05/16/2019   Procedure: DIALYSIS/PERMA CATHETER INSERTION;  Surgeon: Algernon Huxley, MD;  Location:  [place] [facility];  Service: Cardiovascular;  Laterality: N/A;   DIALYSIS/PERMA CATHETER INSERTION N/A 10/17/2019   Procedure: DIALYSIS/PERMA CATHETER INSERTION;  Surgeon: Algernon Huxley, MD;  Location: [facility];  Service: Cardiovascular;  Laterality: N/A;   DIALYSIS/PERMA CATHETER INSERTION N/A 12/10/2019   Procedure: DIALYSIS/PERMA CATHETER INSERTION;  Surgeon: Katha Cabal, MD;  Location: [place] [facility];  Service: Cardiovascular;  Laterality: N/A;   DIALYSIS/PERMA CATHETER REMOVAL N/A 05/13/2019   Procedure: DIALYSIS/PERMA CATHETER REMOVAL;  Surgeon: Algernon Huxley, MD;  Location: [place] [facility];  Service: Cardiovascular;  Laterality: N/A;   DIALYSIS/PERMA CATHETER REMOVAL N/A 12/06/2019   Procedure: DIALYSIS/PERMA CATHETER REMOVAL;  Surgeon: Algernon Huxley, MD;  Location: [place] [facility];  Service: Cardiovascular;  Laterality: N/A;   TEE WITHOUT CARDIOVERSION N/A 05/15/2019   Procedure: TRANSESOPHAGEAL ECHOCARDIOGRAM (TEE);  Surgeon: Teodoro Spray, MD;  Location: [facility];  Service: Cardiovascular;  Laterality: N/A;     Social History:   reports that he has quit smoking. He has never used smokeless tobacco. He reports that he does not drink alcohol and does not use drugs.   Family History:  His family history is not on file.   Allergies Allergies  Allergen Reactions   Tape Dermatitis and Rash     Home Medications  Prior to Admission medications   Medication Sig Start Date End Date Taking? Authorizing Provider  albuterol (VENTOLIN HFA) 108 (90 Base) MCG/ACT inhaler Inhale 2 puffs into the lungs every 6 (six) hours as needed for wheezing or shortness of breath.   Yes [provider]  amLODipine (NORVASC) 10 MG tablet Take 1 tablet (10 mg total) by mouth at bedtime. 12/05/19  Yes Wieting, Richard, MD  atorvastatin (LIPITOR) 40 MG tablet Take 40 mg by mouth at bedtime.   Yes [provider]  calcium carbonate (TUMS - DOSED IN MG ELEMENTAL  CALCIUM) 500 MG chewable tablet Chew 1 tablet by mouth every 6 (six) hours as needed for indigestion.   Yes [provider]  carvedilol (COREG) 12.5 MG tablet Take 12.5 mg by mouth 2 (two) times daily with a meal. 03/31/18  Yes [provider]  citalopram (CELEXA) 20 MG tablet Take 20 mg by mouth daily.   Yes [provider]  ergocalciferol (VITAMIN D2) 1.25 MG (50000 UT) capsule  Take by mouth. Administered at analysis 02/02/18  Yes [provider]  ferric citrate (AURYXIA) 1 GM 210 MG(Fe) tablet Take by mouth.  04/29/19  Yes [provider]  fluticasone (FLONASE) 50 MCG/ACT nasal spray Place 1 spray into both nostrils daily. 04/27/20  Yes [provider]  hydrALAZINE (APRESOLINE) 25 MG tablet Take 1 tablet (25 mg total) by mouth every 8 (eight) hours. 05/17/19  Yes Nolberto Hanlon, MD  linagliptin (TRADJENTA) 5 MG TABS tablet Take 5 mg by mouth daily.   Yes [provider]  sevelamer carbonate (RENVELA) 800 MG tablet Take 1,600 mg by mouth 3 (three) times daily with meals. 12/23/19  Yes [provider]  traZODone (DESYREL) 50 MG tablet Take 25 mg by mouth at bedtime.   Yes [provider]  lidocaine (LIDODERM) 5 % Place 1 patch onto the skin every 12 (twelve) hours. Remove & Discard patch within 12 hours or as directed by MD Patient not taking: No sig reported 11/10/19 11/09/20  Laban Emperor, PA-C  losartan (COZAAR) 50 MG tablet Take 1 tablet (50 mg total) by mouth at bedtime. 12/05/19   Loletha Grayer, MD     Critical care time: 45    JD Rollene Rotunda, [facility] 06/22/2020, 11:40 PM  Please see [email or web address] for pager details.  From 7A-7P if no response, please call [phone]. After hours, please call ELink [phone].

## 2020-06-22 NOTE — Consult Note (Signed)
Cardiology Consultation:  [name] MRN: [id number]; DOB: February 26, 1955  Admit date: 06/24/2020 Date of Consult: 06/22/2020  Primary Care Provider: Marguerita Merles, MD Primary Cardiologist: None  Primary Electrophysiologist:  None   Patient Profile:  Daniel Thomas is a 65 y.o. male with a hx of ESRD on hemodialysis via right chest PermCath, COPD, diastolic heart failure, diabetes, hypertension who is being seen today for the evaluation of preoperative assessment at the request of Daniel Catching, MD.  History of Present Illness:  Daniel Thomas was admitted to the hospital on 06/09/2020 with acute onset abdominal pain and nausea vomiting.  He is subsequently found to have acute cholecystitis.  Cardiology was asked to weigh in on preoperative assessment.  His medical history is significant for end-stage renal disease on hemodialysis via right chest PermCath.  He is reluctant to pursue an AV fistula.  He was evaluated by cardiology in 2019 in [place] prior to being considered for AV fistula placement and was deemed low risk for surgery.  He also has diabetes and hypertension.  He has never had a heart attack or stroke.  He does not appear to require insulin.  He informs me that prior to admission he was able to walk as far as he likes.  He had no chest pain or shortness of breath.  He reports he is without symptoms otherwise.  His EKG demonstrates sinus rhythm with an old anteroseptal infarct.  He had an echocardiogram performed in 2020 that showed normal LV function with an EF of 60 to 65%.  Labs are remarkable for a sodium of 128.  Serum creatinine 7.30.  WBC 25.8.  Hemoglobin 8.6.    Heart Pathway Score:       Past Medical History: Past Medical History:  Diagnosis Date   COPD (chronic obstructive pulmonary disease) ([place])    Diabetes mellitus without complication ([place])    Enlarged prostate    ESRD on dialysis ([place])    Heart murmur    High cholesterol    Hypertension      Past Surgical History: Past Surgical History:  Procedure Laterality Date   DIALYSIS/PERMA CATHETER INSERTION N/A 01/12/2018   Procedure: DIALYSIS/PERMA CATHETER INSERTION;  Surgeon: Daniel Cabal, MD;  Location: [facility];  Service: Cardiovascular;  Laterality: N/A;   DIALYSIS/PERMA CATHETER INSERTION N/A 05/16/2019   Procedure: DIALYSIS/PERMA CATHETER INSERTION;  Surgeon: Daniel Huxley, MD;  Location: [facility];  Service: Cardiovascular;  Laterality: N/A;   DIALYSIS/PERMA CATHETER INSERTION N/A 10/17/2019   Procedure: DIALYSIS/PERMA CATHETER INSERTION;  Surgeon: Daniel Huxley, MD;  Location: [facility];  Service: Cardiovascular;  Laterality: N/A;   DIALYSIS/PERMA CATHETER INSERTION N/A 12/10/2019   Procedure: DIALYSIS/PERMA CATHETER INSERTION;  Surgeon: Daniel Cabal, MD;  Location: [facility];  Service: Cardiovascular;  Laterality: N/A;   DIALYSIS/PERMA CATHETER REMOVAL N/A 05/13/2019   Procedure: DIALYSIS/PERMA CATHETER REMOVAL;  Surgeon: Daniel Huxley, MD;  Location: [facility];  Service: Cardiovascular;  Laterality: N/A;   DIALYSIS/PERMA CATHETER REMOVAL N/A 12/06/2019   Procedure: DIALYSIS/PERMA CATHETER REMOVAL;  Surgeon: Daniel Huxley, MD;  Location: [facility];  Service: Cardiovascular;  Laterality: N/A;   TEE WITHOUT CARDIOVERSION N/A 05/15/2019   Procedure: TRANSESOPHAGEAL ECHOCARDIOGRAM (TEE);  Surgeon: Daniel Spray, MD;  Location: [facility];  Service: Cardiovascular;  Laterality: N/A;     Home Medications:  Prior to Admission medications   Medication Sig Start Date End Date Taking?  Authorizing Provider  albuterol (VENTOLIN HFA) 108 (90 Base) MCG/ACT inhaler Inhale 2 puffs into the lungs every 6 (six) hours as needed for wheezing or shortness of breath.   Yes [provider]  amLODipine (NORVASC) 10 MG tablet Take 1 tablet (10 mg total) by mouth at bedtime. 12/05/19  Yes Thomas, Richard, MD   atorvastatin (LIPITOR) 40 MG tablet Take 40 mg by mouth at bedtime.   Yes [provider]  calcium carbonate (TUMS - DOSED IN MG ELEMENTAL CALCIUM) 500 MG chewable tablet Chew 1 tablet by mouth every 6 (six) hours as needed for indigestion.   Yes [provider]  carvedilol (COREG) 12.5 MG tablet Take 12.5 mg by mouth 2 (two) times daily with a meal. 03/31/18  Yes [provider]  citalopram (CELEXA) 20 MG tablet Take 20 mg by mouth daily.   Yes [provider]  ergocalciferol (VITAMIN D2) 1.25 MG (50000 UT) capsule Take by mouth. Administered at analysis 02/02/18  Yes [provider]  ferric citrate (AURYXIA) 1 GM 210 MG(Fe) tablet Take by mouth.  04/29/19  Yes [provider]  fluticasone (FLONASE) 50 MCG/ACT nasal Thomas Place 1 Thomas into both nostrils daily. 04/27/20  Yes [provider]  hydrALAZINE (APRESOLINE) 25 MG tablet Take 1 tablet (25 mg total) by mouth every 8 (eight) hours. 05/17/19  Yes Daniel Hanlon, MD  linagliptin (TRADJENTA) 5 MG TABS tablet Take 5 mg by mouth daily.   Yes [provider]  sevelamer carbonate (RENVELA) 800 MG tablet Take 1,600 mg by mouth 3 (three) times daily with meals. 12/23/19  Yes [provider]  traZODone (DESYREL) 50 MG tablet Take 25 mg by mouth at bedtime.   Yes [provider]  lidocaine (LIDODERM) 5 % Place 1 patch onto the skin every 12 (twelve) hours. Remove & Discard patch within 12 hours or as directed by MD Patient not taking: No sig reported 11/10/19 11/09/20  Daniel Emperor, PA-C  losartan (COZAAR) 50 MG tablet Take 1 tablet (50 mg total) by mouth at bedtime. 12/05/19   Daniel Grayer, MD    Inpatient Medications: Scheduled Meds:  (feeding supplement) PROSource Plus  30 mL Oral BID BM   amLODipine  10 mg Oral QHS   [START ON 06/23/2020] calcitRIOL  0.25 mcg Oral Once per day on Tue Thu Sat   carvedilol  12.5 mg Oral BID WC   Chlorhexidine Gluconate Cloth  6  each Topical Q0600   [START ON 06/23/2020] cinacalcet  120 mg Oral Once per day on Tue Thu Sat   citalopram  20 mg Oral Daily   [START ON 06/23/2020] darbepoetin (ARANESP) injection - DIALYSIS  60 mcg Intravenous Q Tue-HD   enoxaparin (LOVENOX) injection  30 mg Subcutaneous Q24H   hydrALAZINE  25 mg Oral Q8H   insulin aspart  0-6 Units Subcutaneous TID WC   sevelamer carbonate  1,600 mg Oral TID WC   traZODone  25 mg Oral QHS   Continuous Infusions:  ceFEPime (MAXIPIME) IV 1 g (06/22/20 1133)   dextrose 5 % and 0.45% NaCl     methocarbamol (ROBAXIN) IV     metronidazole 500 mg (06/22/20 0922)   PRN Meds: acetaminophen **OR** [DISCONTINUED] acetaminophen, calcium carbonate, methocarbamol (ROBAXIN) IV, metoprolol tartrate, ondansetron **OR** ondansetron (ZOFRAN) IV, oxyCODONE, polyethylene glycol  Allergies:    Allergies  Allergen Reactions   Tape Dermatitis and Rash    Social History:   Social History   Socioeconomic History   Marital status:  Single    Spouse name: Not on file   Number of children: Not on file   Years of education: Not on file   Highest education level: Not on file  Occupational History   Not on file  Tobacco Use   Smoking status: Former    Years: 20.00    Pack years: 0.00    Types: Cigarettes   Smokeless tobacco: Never  Vaping Use   Vaping Use: Never used  Substance and Sexual Activity   Alcohol use: Never   Drug use: Never   Sexual activity: Not Currently  Other Topics Concern   Not on file  Social History Narrative   Lives home alone, 3 aunts that live next door and brother 1 mile away. Helps commute to appointments.   Social Determinants of Health   Financial Resource Strain: Not on file  Food Insecurity: Not on file  Transportation Needs: Not on file  Physical Activity: Not on file  Stress: Not on file  Social Connections: Not on file  Intimate Partner Violence: Not on file     Family History:   No family history on file.   ROS:   All other ROS reviewed and negative. Pertinent positives noted in the HPI.     Physical Exam/Data:   Vitals:   06/21/20 2105 06/22/20 0453 06/22/20 1420 06/22/20 1458  BP: 134/72 (!) 145/69 105/61 126/66  Pulse: 84 80  94  Resp: 18 18  19   Temp: 98.9 F (37.2 C) 98.2 F (36.8 C)  100.3 F (37.9 C)  TempSrc:  Oral  Oral  SpO2: 98%   98%  Weight:      Height:        Intake/Output Summary (Last 24 hours) at 06/22/2020 1622 Last data filed at 06/22/2020 1459 Gross per 24 hour  Intake 360 ml  Output 0 ml  Net 360 ml    Last 3 Weights 06/21/2020 06/23/2020 06/19/2020  Weight (lbs) 160 lb 15 oz 165 lb 9.1 oz 160 lb  Weight (kg) 73 kg 75.1 kg 72.576 kg    Body mass index is 30.41 kg/m.   General: Well nourished, well developed, in no acute distress Head: Atraumatic, normal size  Eyes: PEERLA, EOMI  Neck: Supple, no JVD Endocrine: No thryomegaly Cardiac: Normal S1, S2; RRR; no murmurs, rubs, or gallops Lungs: Clear to auscultation bilaterally, no wheezing, rhonchi or rales  Abd: Soft, nontender, no hepatomegaly  Ext: No edema, pulses 2+ Musculoskeletal: No deformities, BUE and BLE strength normal and equal Skin: Warm and dry, no rashes   Neuro: Alert and oriented to person, place, time, and situation, CNII-XII grossly intact, no focal deficits  Psych: Normal mood and affect   EKG:  The EKG was personally reviewed and demonstrates: Normal sinus rhythm heart rate 89, old anteroseptal infarct Telemetry:  Telemetry was personally reviewed and demonstrates: Not available  Relevant CV Studies: TTE 01/07/2018 - Left ventricle: The cavity size was normal. There was severe    concentric hypertrophy. Systolic function was normal. The    estimated ejection fraction was in the range of 60% to 65%. Wall    motion was normal; there were no regional wall motion    abnormalities. Features are consistent with a pseudonormal left    ventricular filling pattern, with concomitant abnormal  relaxation    and increased filling pressure (grade 2 diastolic dysfunction).  - Mitral valve: There was mild regurgitation.  - Right ventricle: The cavity size was normal. Wall thickness was  mildly to moderately increased.  - Tricuspid valve: There was mild-moderate regurgitation.  - Pulmonary arteries: Systolic pressure was mild to moderately    elevated PA peak pressure: 43 mm Hg (S).   Laboratory Data: High Sensitivity Troponin:   Recent Labs  Lab 06/09/2020 0720 06/26/2020 0906  TROPONINIHS 60* 57*     Cardiac EnzymesNo results for input(s): TROPONINI in the last 168 hours. No results for input(s): TROPIPOC in the last 168 hours.  Chemistry Recent Labs  Lab 06/23/2020 0720 06/21/20 0233 06/22/20 0103  NA 135 133* 128*  K 4.4 3.7 4.0  CL 95* 95* 92*  CO2 24 29 23   GLUCOSE 156* 123* 107*  BUN 45* 14 30*  CREATININE 9.62* 4.54* 7.30*  CALCIUM 8.3* 8.2* 7.7*  GFRNONAA 6* 14* 8*  ANIONGAP 16* 9 13    Recent Labs  Lab 06/15/2020 0720 06/21/20 0233 06/22/20 0103  PROT 7.4 6.7 6.2*  ALBUMIN 3.8 3.2* 2.9*  AST 20 20 17   ALT 20 22 16   ALKPHOS 111 98 87  BILITOT 0.7 1.0 1.1   Hematology Recent Labs  Lab 06/26/2020 0720 06/21/20 0233 06/22/20 0103  WBC 10.1 17.5* 25.8*  RBC 3.40* 2.95* 2.79*  HGB 10.4* 9.1* 8.6*  HCT 32.1* 27.3* 26.1*  MCV 94.4 92.5 93.5  MCH 30.6 30.8 30.8  MCHC 32.4 33.3 33.0  RDW 14.8 14.8 14.5  PLT 335 293 283   BNPNo results for input(s): BNP, PROBNP in the last 168 hours.  DDimer No results for input(s): DDIMER in the last 168 hours.  Radiology/Studies:  US Abdomen Limited RUQ (LIVER/GB)  Result Date: 06/08/2020 CLINICAL DATA:  65 year old with nausea and vomiting. EXAM: ULTRASOUND ABDOMEN LIMITED RIGHT UPPER QUADRANT COMPARISON:  01/15/2018 FINDINGS: Gallbladder: Gallbladder is mildly distended and there are multiple small echogenic foci compatible with shadowing gallstones. Index stone measures approximately 5 mm. Reportedly, the patient  has a sonographic Murphy sign. There is no evidence for gallbladder wall thickening or pericholecystic fluid. Evidence for echogenic sludge. Common bile duct: Diameter: 3 mm Liver: No focal lesion identified. Within normal limits in parenchymal echogenicity. Portal vein is patent on color Doppler imaging with normal direction of blood flow towards the liver. Other: None. IMPRESSION: Gallbladder is mildly distended with cholelithiasis. Reportedly, the patient has a sonographic Murphy sign. Findings are concerning for acute cholecystitis. Electronically Signed   By: Markus Daft M.D.   On: 06/12/2020 08:44    Assessment and Plan:   Preoperative Assessment -He has been admitted with acute cholecystitis.  He has an elevated white count of 25,000.  He reports no chest pain or shortness of breath.  Prior to this admission he was able to complete greater than 4 METS without any limitations.  He had an echocardiogram performed in 2020 that showed normal LV function.  He has no murmurs on examination.  He overall is without any cardiovascular complaints. --The Revised Cardiac Risk Index = 2 (CHF, Cr >2) which equates to 6.6% (moderate risk) estimated risk of perioperative myocardial infarction, pulmonary edema, ventricular fibrillation, cardiac arrest, or complete heart block.  -He has moderate risk as estimated by the RCRI.  Given his lack of cardiovascular symptoms and ability to achieve greater than 4 METS I would not delay his surgery for further cardiovascular testing.  I will defer to the surgical team how best to proceed, but I would not delay his surgery.  Any procedure he does have is rather urgent given his acute cholecystitis.  This would not be  delayed regardless. -Our service is available as needed in the peri-operative period.    [facility] will sign off.   Medication Recommendations: As above Other recommendations (labs, testing, etc): None Follow up as an outpatient: None needed  For  questions or updates, please contact [place] Please consult [email or web address] for contact info under   Signed, [place] T. Audie Box, MD, [place]  06/22/2020 4:22 PM

## 2020-06-22 NOTE — Progress Notes (Signed)
Chaplain began ministry with pt's family earlier in the evening. Continued spiritual care throughout the later evening including praying w/ family present and family on the phone.  Chaplain stands ready to attend family throughout the rest of the night as called to service.  [place]

## 2020-06-22 NOTE — Progress Notes (Signed)
Pt BP decreased and HR decreased, elink notified.

## 2020-06-22 NOTE — Progress Notes (Addendum)
eLink Physician-Brief Progress Note Patient Name: Daniel Thomas DOB: 06/01/1955 MRN: [id number]   Date of Service  06/22/2020  HPI/Events of Note  45 M ESRD on HD, COPD, DM, HTN, HFpEF initially admitted for acute cholecystitis. Received oral contrast. Cardiac arrest while in CT scan. Report of swollen tongue during intubation. ROSC after 26 minutes.  eICU Interventions  Continue post arrest care  Became bradycardic and hypotensive Atropine given, PEA arrested ROSC after 1 round on epi Place on epinephrine for anaphylactic shock Bedside CCM team informed, patient will need arterial line. IO in place but will likely need central line as well     Intervention Category Evaluation Type: New Patient Evaluation  Judd Lien 06/22/2020, 10:47 PM

## 2020-06-22 NOTE — Progress Notes (Signed)
Progress Note     Subjective: CC: right upper quadrant abdominal pain is worse. He has been tolerating clear liquids well without nausea, emesis, or increased abdominal pain. He denies any new respiratory or urinary complaints. He states he lives alone, ambulates without assistive device, and performs activities of daily living without assistance.   He asked I call and discuss treatment options with his brother Daniel Thomas) and I had extensive discussion with him on speaker phone while in the patient's room   Objective: Vital signs in last 24 hours: Temp:  [98.2 F (36.8 C)-99.6 F (37.6 C)] 98.2 F (36.8 C) (06/20 0453) Pulse Rate:  [80-100] 80 (06/20 0453) Resp:  [16-18] 18 (06/20 0453) BP: (134-145)/(69-76) 145/69 (06/20 0453) SpO2:  [98 %-100 %] 98 % (06/19 2105) Last BM Date: 06/29/2020  Intake/Output from previous day: 06/19 0701 - 06/20 0700 In: 1680 [P.O.:1680] Out: 0  Intake/Output this shift: No intake/output data recorded.  PE: General: pleasant, WD, male who is laying in bed in NAD HEENT: head is normocephalic, atraumatic. Mouth is pink and moist Heart: regular, rate, and rhythm.  Palpable radial pulses bilaterally Lungs: Respiratory effort nonlabored Abd: soft, ND, +BS, focal moderate to severe TTP RUQ with guarding MS: all 4 extremities are symmetrical with no cyanosis, clubbing, or edema. Skin: warm and dry with no masses, lesions, or rashes Psych: A&Ox3 with an appropriate affect.    Lab Results:  Recent Labs    06/21/20 0233 06/22/20 0103  WBC 17.5* 25.8*  HGB 9.1* 8.6*  HCT 27.3* 26.1*  PLT 293 283   BMET Recent Labs    06/21/20 0233 06/22/20 0103  NA 133* 128*  K 3.7 4.0  CL 95* 92*  CO2 29 23  GLUCOSE 123* 107*  BUN 14 30*  CREATININE 4.54* 7.30*  CALCIUM 8.2* 7.7*   PT/INR No results for input(s): LABPROT, INR in the last 72 hours. CMP     Component Value Date/Time   NA 128 (L) 06/22/2020 0103   K 4.0 06/22/2020 0103   CL  92 (L) 06/22/2020 0103   CO2 23 06/22/2020 0103   GLUCOSE 107 (H) 06/22/2020 0103   BUN 30 (H) 06/22/2020 0103   CREATININE 7.30 (H) 06/22/2020 0103   CALCIUM 7.7 (L) 06/22/2020 0103   PROT 6.2 (L) 06/22/2020 0103   ALBUMIN 2.9 (L) 06/22/2020 0103   AST 17 06/22/2020 0103   ALT 16 06/22/2020 0103   ALKPHOS 87 06/22/2020 0103   BILITOT 1.1 06/22/2020 0103   GFRNONAA 8 (L) 06/22/2020 0103   GFRAA 10 (L) 06/29/2019 0340   Lipase     Component Value Date/Time   LIPASE 40 06/22/2020 0103       Studies/Results: US Abdomen Limited RUQ (LIVER/GB)  Result Date: 07/01/2020 CLINICAL DATA:  65 year old with nausea and vomiting. EXAM: ULTRASOUND ABDOMEN LIMITED RIGHT UPPER QUADRANT COMPARISON:  01/15/2018 FINDINGS: Gallbladder: Gallbladder is mildly distended and there are multiple small echogenic foci compatible with shadowing gallstones. Index stone measures approximately 5 mm. Reportedly, the patient has a sonographic Murphy sign. There is no evidence for gallbladder wall thickening or pericholecystic fluid. Evidence for echogenic sludge. Common bile duct: Diameter: 3 mm Liver: No focal lesion identified. Within normal limits in parenchymal echogenicity. Portal vein is patent on color Doppler imaging with normal direction of blood flow towards the liver. Other: None. IMPRESSION: Gallbladder is mildly distended with cholelithiasis. Reportedly, the patient has a sonographic Murphy sign. Findings are concerning for acute cholecystitis. Electronically Signed  By: Markus Daft M.D.   On: 06/11/2020 08:44    Anti-infectives: Anti-infectives (From admission, onward)    Start     Dose/Rate Route Frequency Ordered Stop   06/03/2020 1030  metroNIDAZOLE (FLAGYL) IVPB 500 mg        500 mg 100 mL/hr over 60 Minutes Intravenous Every 8 hours 06/08/2020 1000     06/10/2020 1030  ceFEPIme (MAXIPIME) 1 g in sodium chloride 0.9 % 100 mL IVPB        1 g 200 mL/hr over 30 Minutes Intravenous Every 24 hours 06/15/2020  1014          Assessment/Plan  Symptomatic Cholelithiasis with possible acute cholecystitis Elevated Lipase without meeting laboratory criteria for Pancreatitis - RUQ [place] w/ cholelithiasis without gallbladder wall thickening or pericholecystitis fluid. +Murphy's sign - LFTs wnl, lipase normal - WBC 25.8 (17.5) and moderate to severe tenderness on exam today - had long discussion with patient and brother regarding treatment options of continued medical management with IV abx vs perc chole vs lap chole with recommendation for perc chole given his cardiopulmonary risk factors and s/sx of worsening infection. Patient and brother are scared of both procedural options and are hesitant to proceed - he does not want to undergo anesthesia but also is worried of having a tube his whole life or dying from it (father died shortly after b/l perc neph tubes). We discussed the differences between nephrostomy and cholecystostomy. Brother wants to make sure all nonoperative options have been explored and he has received adequate abx before considering surgery - have requested cardiology consult for risk stratification for surgery   FEN - NPO for now, IVF per primary VTE - SCDs, lovenox ID - Cefepime/Flagyl >>    COPD DM2 ESRD on HD T/T/S HLD HTN    LOS: 2 days    Daniel Thomas, [facility] 06/22/2020, 8:15 AM Please see Amion for pager number during day hours 7:00am-4:30pm

## 2020-06-22 NOTE — ED Provider Notes (Signed)
The emergency department staff including myself, resident physician and nurses were called emergently to the CT scanner after patient suddenly lost a pulse.  Per report patient was admitted for gallbladder pathology and was sent down from inpatient for CT scan with contrast.  Approximately 45 minutes after contrast patient suddenly lost a pulse, per report his tongue was swollen.  CPR was started immediately afterwards.  On arrival CPR had begun, I continue to supervise resuscitation efforts and CPR.  Nursing staff helping with coordination and timing of medications and reminders.  Couple rounds of CPR after multiple epinephrine doses, steroid doses, calcium and bicarb were given.  CPR was continued after pulse check initially showed asystole.  Repeat pulse check showed ventricular fibrillation, shock 120 J utilized and CPR continued.  During entire resuscitation effort patient did not move spontaneously or show signs of significant perfusion.  Initial attempts at intubation difficult due to swollen tongue and airway, unsuccessful with 7-1/2 tube.  Glide scope brought in and utilized and once we had 6 and half ET tube we were successful.  Color change on the capnography was yellow.  Respiratory assisted with bagging and further management.  Resident physicians from internal medicine came down to assist and continue to follow the patient, they called critical care to come down and assessed.  After approximately 30 minutes of resuscitation efforts we were able to obtain a pulse, sinus rhythm with PACs.  Discussed obtaining EKG when we are able to as we are still in the CT scanner with limited access. Amio bolus given. Calcium/ Magn/ solumedrol doses given.  Discussed with resident physicians who reviewed medical records, blood work was unremarkable earlier today, potassium and glucose normal.  Primary physician following patient came down for an update in the emergency room/CT scanner. Code team, primary  physician and soon critical care comfortable with continuing efforts.  CPR  Date/Time: 06/22/2020 10:01 PM Performed by: Elnora Morrison, MD Authorized by: Elnora Morrison, MD  CPR Procedure Details:      Amount of time prior to administration of ACLS/BLS (minutes):  1   ACLS/BLS initiated by EMS: No     CPR/ACLS performed in the ED: Yes     Duration of CPR (minutes):  24   Outcome: ROSC obtained    CPR performed via ACLS guidelines under my direct supervision.  See RN documentation for details including defibrillator use, medications, doses and timing. Comments:     Supervised Procedure Name: Intubation Date/Time: 06/22/2020 10:02 PM Performed by: Elnora Morrison, MD Pre-anesthesia Checklist: Patient identified, Patient being monitored, Emergency Drugs available, Timeout performed and Suction available Oxygen Delivery Method: Non-rebreather mask Preoxygenation: Pre-oxygenation with 100% oxygen Induction Type: Rapid sequence Ventilation: Mask ventilation without difficulty Laryngoscope Size: Glidescope and 3 Grade View: Grade III Tube size: 6.5 mm Number of attempts: 3 Airway Equipment and Method: Bougie stylet Placement Confirmation: ETT inserted through vocal cords under direct vision, CO2 detector and Breath sounds checked- equal and bilateral Secured at: 18 cm Tube secured with: ETT holder Difficulty Due To: Difficulty was anticipated   Cardioversion emergent Indication cardiac arrest 100 Joules V fib Successful Performed by myself.  Anaphylactic shock Cardiac arrest ESRD on dialysis  Golda Acre, MD 06/22/20 2205

## 2020-06-22 NOTE — Progress Notes (Signed)
Daniel Thomas  LGX:[id number] DOB: [birth year] DOA: 06/26/2020 PCP: Marguerita Merles, MD    Brief Narrative:  65 year old with a history of ESRD, COPD, CHF, DM 2, and HTN who presented to the ED with intractable vomiting.  In the ED US revealed a mildly distended gallbladder with cholelithiasis and there was a positive Murphy sign.  Significant Events:  6/18 admit via ED  Consultants:  General Surgery Nephrology  Code Status: FULL CODE  Antimicrobials:  Cefepime 6/18 > Flagyl 6/18 >  DVT prophylaxis: SCDs  Subjective: No new events reported overnight.  Afebrile.  Vital signs stable.  WBC continues to trend upward.  Discussed patient's care with the surgical team.  Patient's family has expressed to the surgical team that they have extreme distrust of the [facility] medical system.  Patient and family are presently not willing to consider surgical treatment  Assessment & Plan:  Acute cholecystitis /symptomatic cholelithiasis General surgery following -appears he needs a cholecystectomy or at least a percutaneous cholecystostomy tube for now -the patient is wishing to avoid any procedure at all cost and is quite fearful of a bad outcome -the surgery team is discussing this with the patient and his family and obtaining follow-up CT abdomen/pelvis for reassessment  Elevated lipase Clinical exam/history not convincing of a true pancreatitis -lipase normal in follow-up  ESRD Ongoing dialysis per Nephrology  DM2 CBG well controlled  HTN Blood pressure controlled  HLD Holding medical therapy at present  COPD Well compensated with no active wheezing  Depression Continue home medical therapy  Family Communication: No family present at time of exam Status is: Inpatient  Remains inpatient appropriate because:Inpatient level of care appropriate due to severity of illness  Dispo: The patient is from: Home              Anticipated d/c is to: Home              Patient currently is  not medically stable to d/c.   Difficult to place patient No    Objective: Blood pressure (!) 145/69, pulse 80, temperature 98.2 F (36.8 C), temperature source Oral, resp. rate 18, height 5\' 1"  (1.549 m), weight 73 kg, SpO2 98 %.  Intake/Output Summary (Last 24 hours) at 06/22/2020 0814 Last data filed at 06/21/2020 2300 Gross per 24 hour  Intake 1680 ml  Output 0 ml  Net 1680 ml    Filed Weights   06/08/2020 1000 06/14/2020 2058 06/21/20 0058  Weight: 72.6 kg 75.1 kg 73 kg    Examination: General: No acute respiratory distress Lungs: Clear to auscultation bilaterally without wheezing Cardiovascular: RRR without murmur or rub Abdomen: Tender to even slight palpation right upper quadrant with apparent rebound, no mass, bowel sounds positive Extremities: No significant edema bilateral lower extremities  CBC: Recent Labs  Lab 06/06/2020 0720 06/21/20 0233 06/22/20 0103  WBC 10.1 17.5* 25.8*  NEUTROABS 8.7*  --   --   HGB 10.4* 9.1* 8.6*  HCT 32.1* 27.3* 26.1*  MCV 94.4 92.5 93.5  PLT 335 293 814    Basic Metabolic Panel: Recent Labs  Lab 06/04/2020 0720 06/21/20 0233 06/22/20 0103  NA 135 133* 128*  K 4.4 3.7 4.0  CL 95* 95* 92*  CO2 24 29 23   GLUCOSE 156* 123* 107*  BUN 45* 14 30*  CREATININE 9.62* 4.54* 7.30*  CALCIUM 8.3* 8.2* 7.7*    GFR: Estimated Creatinine Clearance: 8.8 mL/min (A) (by C-G formula based on SCr of 7.3 mg/dL (H)).  Liver Function Tests: Recent Labs  Lab 06/27/2020 0720 06/21/20 0233 06/22/20 0103  AST 20 20 17   ALT 20 22 16   ALKPHOS 111 98 87  BILITOT 0.7 1.0 1.1  PROT 7.4 6.7 6.2*  ALBUMIN 3.8 3.2* 2.9*    Recent Labs  Lab 07/01/2020 0720 06/21/20 0233 06/22/20 0103  LIPASE 108* 87* 40      HbA1C: Hgb A1c MFr Bld  Date/Time Value Ref Range Status  12/06/2019 03:57 AM 6.2 (H) 4.8 - 5.6 % Final    Comment:    (NOTE) Pre diabetes:          5.7%-6.4%  Diabetes:              >6.4%  Glycemic control for   <7.0% adults  with diabetes   01/07/2018 02:11 PM 8.5 (H) 4.8 - 5.6 % Final    Comment:    (NOTE) Pre diabetes:          5.7%-6.4% Diabetes:              >6.4% Glycemic control for   <7.0% adults with diabetes     CBG: Recent Labs  Lab 06/21/20 0748 06/21/20 1225 06/21/20 1642 06/21/20 2111 06/22/20 0734  GLUCAP 114* 155* 100* 188* 145*     Recent Results (from the past 240 hour(s))  SARS CORONAVIRUS 2 (TAT 6-24 HRS) Nasopharyngeal Nasopharyngeal Swab     Status: None   Collection Time: 06/26/2020  9:32 AM   Specimen: Nasopharyngeal Swab  Result Value Ref Range Status   SARS Coronavirus 2 NEGATIVE NEGATIVE Final    Comment: (NOTE) SARS-CoV-2 target nucleic acids are NOT DETECTED.  The SARS-CoV-2 RNA is generally detectable in upper and lower respiratory specimens during the acute phase of infection. Negative results do not preclude SARS-CoV-2 infection, do not rule out co-infections with other pathogens, and should not be used as the sole basis for treatment or other patient management decisions. Negative results must be combined with clinical observations, patient history, and epidemiological information. The expected result is Negative.  Fact Sheet for Patients: SugarRoll.be  Fact Sheet for Healthcare Providers: https://www.woods-mathews.com/  This test is not yet approved or cleared by the [place] FDA and  has been authorized for detection and/or diagnosis of SARS-CoV-2 by FDA under an Emergency Use Authorization (EUA). This EUA will remain  in effect (meaning this test can be used) for the duration of the COVID-19 declaration under Se ction 564(b)(1) of the Act, 21 U.S.C. section 360bbb-3(b)(1), unless the authorization is terminated or revoked sooner.  Performed at [facility], [place] [address], [place], [place] [address]       Scheduled Meds:  (feeding supplement) PROSource Plus  30 mL Oral BID BM   amLODipine   10 mg Oral QHS   [START ON 06/23/2020] calcitRIOL  0.25 mcg Oral Once per day on Tue Thu Sat   carvedilol  12.5 mg Oral BID WC   Chlorhexidine Gluconate Cloth  6 each Topical Q0600   [START ON 06/23/2020] cinacalcet  120 mg Oral Once per day on Tue Thu Sat   citalopram  20 mg Oral Daily   [START ON 06/23/2020] darbepoetin (ARANESP) injection - DIALYSIS  60 mcg Intravenous Q Tue-HD   enoxaparin (LOVENOX) injection  30 mg Subcutaneous Q24H   hydrALAZINE  25 mg Oral Q8H   insulin aspart  0-6 Units Subcutaneous TID WC   sevelamer carbonate  1,600 mg Oral TID WC   traZODone  25 mg Oral  QHS   Continuous Infusions:  ceFEPime (MAXIPIME) IV 1 g (06/21/20 1027)   lactated ringers 10 mL/hr at 06/21/20 1213   methocarbamol (ROBAXIN) IV     metronidazole 500 mg (06/22/20 0143)     LOS: 2 days   Cherene Altes, MD [facility] Office  [phone] Pager - Text Page per Shea Evans  If 7PM-7AM, please contact night-coverage per Amion 06/22/2020, 8:14 AM

## 2020-06-23 ENCOUNTER — Inpatient Hospital Stay (HOSPITAL_COMMUNITY): Payer: Medicare Other

## 2020-06-23 ENCOUNTER — Encounter (HOSPITAL_COMMUNITY): Payer: Self-pay | Admitting: Internal Medicine

## 2020-06-23 DIAGNOSIS — K810 Acute cholecystitis: Secondary | ICD-10-CM | POA: Diagnosis not present

## 2020-06-23 DIAGNOSIS — N186 End stage renal disease: Secondary | ICD-10-CM

## 2020-06-23 DIAGNOSIS — J9601 Acute respiratory failure with hypoxia: Secondary | ICD-10-CM | POA: Diagnosis not present

## 2020-06-23 HISTORY — PX: IR PERC CHOLECYSTOSTOMY: IMG2326

## 2020-06-23 LAB — RENAL FUNCTION PANEL
Albumin: 2.5 g/dL — ABNORMAL LOW (ref 3.5–5.0)
Anion gap: 16 — ABNORMAL HIGH (ref 5–15)
BUN: 56 mg/dL — ABNORMAL HIGH (ref 8–23)
CO2: 20 mmol/L — ABNORMAL LOW (ref 22–32)
Calcium: 8 mg/dL — ABNORMAL LOW (ref 8.9–10.3)
Chloride: 93 mmol/L — ABNORMAL LOW (ref 98–111)
Creatinine, Ser: 8.07 mg/dL — ABNORMAL HIGH (ref 0.61–1.24)
GFR, Estimated: 7 mL/min — ABNORMAL LOW (ref >60)
Glucose, Bld: 144 mg/dL — ABNORMAL HIGH (ref 70–99)
Phosphorus: 4.5 mg/dL (ref 2.5–4.6)
Potassium: 3.7 mmol/L (ref 3.5–5.1)
Sodium: 129 mmol/L — ABNORMAL LOW (ref 135–145)

## 2020-06-23 LAB — COMPREHENSIVE METABOLIC PANEL
ALT: 352 U/L — ABNORMAL HIGH (ref 0–44)
AST: 901 U/L — ABNORMAL HIGH (ref 15–41)
Albumin: 2.6 g/dL — ABNORMAL LOW (ref 3.5–5.0)
Alkaline Phosphatase: 168 U/L — ABNORMAL HIGH (ref 38–126)
Anion gap: 18 — ABNORMAL HIGH (ref 5–15)
BUN: 57 mg/dL — ABNORMAL HIGH (ref 8–23)
CO2: 18 mmol/L — ABNORMAL LOW (ref 22–32)
Calcium: 7.6 mg/dL — ABNORMAL LOW (ref 8.9–10.3)
Chloride: 89 mmol/L — ABNORMAL LOW (ref 98–111)
Creatinine, Ser: 9.01 mg/dL — ABNORMAL HIGH (ref 0.61–1.24)
GFR, Estimated: 6 mL/min — ABNORMAL LOW (ref >60)
Glucose, Bld: 359 mg/dL — ABNORMAL HIGH (ref 70–99)
Potassium: 4.8 mmol/L (ref 3.5–5.1)
Sodium: 125 mmol/L — ABNORMAL LOW (ref 135–145)
Total Bilirubin: 1.9 mg/dL — ABNORMAL HIGH (ref 0.3–1.2)
Total Protein: 5.9 g/dL — ABNORMAL LOW (ref 6.5–8.1)

## 2020-06-23 LAB — TROPONIN I (HIGH SENSITIVITY): Troponin I (High Sensitivity): 2996 ng/L (ref <18)

## 2020-06-23 LAB — GLUCOSE, CAPILLARY
Glucose-Capillary: 114 mg/dL — ABNORMAL HIGH (ref 70–99)
Glucose-Capillary: 363 mg/dL — ABNORMAL HIGH (ref 70–99)
Glucose-Capillary: 398 mg/dL — ABNORMAL HIGH (ref 70–99)
Glucose-Capillary: 417 mg/dL — ABNORMAL HIGH (ref 70–99)
Glucose-Capillary: 394 mg/dL — ABNORMAL HIGH (ref 70–99)
Glucose-Capillary: 347 mg/dL — ABNORMAL HIGH (ref 70–99)
Glucose-Capillary: 305 mg/dL — ABNORMAL HIGH (ref 70–99)
Glucose-Capillary: 238 mg/dL — ABNORMAL HIGH (ref 70–99)
Glucose-Capillary: 196 mg/dL — ABNORMAL HIGH (ref 70–99)
Glucose-Capillary: 144 mg/dL — ABNORMAL HIGH (ref 70–99)
Glucose-Capillary: 131 mg/dL — ABNORMAL HIGH (ref 70–99)
Glucose-Capillary: 107 mg/dL — ABNORMAL HIGH (ref 70–99)
Glucose-Capillary: 96 mg/dL (ref 70–99)
Glucose-Capillary: 97 mg/dL (ref 70–99)
Glucose-Capillary: 101 mg/dL — ABNORMAL HIGH (ref 70–99)
Glucose-Capillary: 101 mg/dL — ABNORMAL HIGH (ref 70–99)

## 2020-06-23 LAB — POCT I-STAT 7, (LYTES, BLD GAS, ICA,H+H)
Acid-base deficit: 2 mmol/L (ref 0.0–2.0)
Bicarbonate: 21.7 mmol/L (ref 20.0–28.0)
Calcium, Ion: 1.07 mmol/L — ABNORMAL LOW (ref 1.15–1.40)
HCT: 26 % — ABNORMAL LOW (ref 39.0–52.0)
Hemoglobin: 8.8 g/dL — ABNORMAL LOW (ref 13.0–17.0)
O2 Saturation: 100 %
Potassium: 5.8 mmol/L — ABNORMAL HIGH (ref 3.5–5.1)
Sodium: 124 mmol/L — ABNORMAL LOW (ref 135–145)
TCO2: 23 mmol/L (ref 22–32)
pCO2 arterial: 32.4 mmHg (ref 32.0–48.0)
pH, Arterial: 7.434 (ref 7.350–7.450)
pO2, Arterial: 326 mmHg — ABNORMAL HIGH (ref 83.0–108.0)

## 2020-06-23 LAB — LACTIC ACID, PLASMA: Lactic Acid, Venous: 4.2 mmol/L (ref 0.5–1.9)

## 2020-06-23 LAB — MAGNESIUM
Magnesium: 2.5 mg/dL — ABNORMAL HIGH (ref 1.7–2.4)
Magnesium: 2.3 mg/dL (ref 1.7–2.4)

## 2020-06-23 LAB — MRSA NEXT GEN BY PCR, NASAL: MRSA by PCR Next Gen: DETECTED — AB

## 2020-06-23 LAB — TRIGLYCERIDES: Triglycerides: 92 mg/dL (ref <150)

## 2020-06-23 LAB — CBC
HCT: 25.7 % — ABNORMAL LOW (ref 39.0–52.0)
Hemoglobin: 8.4 g/dL — ABNORMAL LOW (ref 13.0–17.0)
MCH: 30.9 pg (ref 26.0–34.0)
MCHC: 32.7 g/dL (ref 30.0–36.0)
MCV: 94.5 fL (ref 80.0–100.0)
Platelets: 272 10*3/uL (ref 150–400)
RBC: 2.72 MIL/uL — ABNORMAL LOW (ref 4.22–5.81)
RDW: 14.7 % (ref 11.5–15.5)
WBC: 30.4 10*3/uL — ABNORMAL HIGH (ref 4.0–10.5)
nRBC: 0 % (ref 0.0–0.2)

## 2020-06-23 MED ORDER — SODIUM CHLORIDE 0.9% FLUSH
5.0000 mL | Freq: Three times a day (TID) | INTRAVENOUS | Status: DC
  Administered 2020-06-23 – 2020-07-02 (×25): 5 mL

## 2020-06-23 MED ORDER — PRISMASOL BGK 4/2.5 32-4-2.5 MEQ/L EC SOLN: Status: DC

## 2020-06-23 MED ORDER — SODIUM CHLORIDE 0.9% FLUSH
10.0000 mL | INTRAVENOUS | Status: DC | PRN
  Administered 2020-06-25: 10 mL

## 2020-06-23 MED ORDER — NOREPINEPHRINE 16 MG/250ML-% IV SOLN
0.0000 ug/min | INTRAVENOUS | Status: DC
  Filled 2020-06-23: qty 250

## 2020-06-23 MED ORDER — LEVETIRACETAM IN NACL 1000 MG/100ML IV SOLN
1000.0000 mg | Freq: Two times a day (BID) | INTRAVENOUS | Status: DC
  Administered 2020-06-23 – 2020-07-02 (×19): 1000 mg via INTRAVENOUS
  Filled 2020-06-23 (×19): qty 100

## 2020-06-23 MED ORDER — CHLORHEXIDINE GLUCONATE CLOTH 2 % EX PADS
6.0000 | MEDICATED_PAD | Freq: Every day | CUTANEOUS | Status: DC
  Administered 2020-06-23 – 2020-07-02 (×12): 6 via TOPICAL

## 2020-06-23 MED ORDER — ORAL CARE MOUTH RINSE
15.0000 mL | OROMUCOSAL | Status: DC
  Administered 2020-06-23 – 2020-07-02 (×91): 15 mL via OROMUCOSAL

## 2020-06-23 MED ORDER — OXYCODONE HCL 5 MG PO TABS: 5.0000 mg | ORAL_TABLET | ORAL | Status: DC | PRN

## 2020-06-23 MED ORDER — PRISMASOL BGK 4/2.5 32-4-2.5 MEQ/L REPLACEMENT SOLN: Status: DC

## 2020-06-23 MED ORDER — DARBEPOETIN ALFA 60 MCG/0.3ML IJ SOSY
60.0000 ug | PREFILLED_SYRINGE | INTRAMUSCULAR | Status: DC
  Administered 2020-06-23 – 2020-06-30 (×2): 60 ug via SUBCUTANEOUS
  Filled 2020-06-23 (×2): qty 0.3

## 2020-06-23 MED ORDER — ARTIFICIAL TEARS OPHTHALMIC OINT
1.0000 "application " | TOPICAL_OINTMENT | Freq: Three times a day (TID) | OPHTHALMIC | Status: DC
  Administered 2020-06-23 – 2020-07-02 (×27): 1 via OPHTHALMIC
  Filled 2020-06-23 (×3): qty 3.5

## 2020-06-23 MED ORDER — VALPROIC ACID 250 MG/5ML PO SOLN
250.0000 mg | Freq: Three times a day (TID) | ORAL | Status: DC
  Administered 2020-06-23 – 2020-06-29 (×20): 250 mg
  Filled 2020-06-23 (×20): qty 5

## 2020-06-23 MED ORDER — POLYETHYLENE GLYCOL 3350 17 G PO PACK: 17.0000 g | PACK | Freq: Every day | ORAL | Status: DC | PRN

## 2020-06-23 MED ORDER — HEPARIN SODIUM (PORCINE) 1000 UNIT/ML DIALYSIS
1000.0000 [IU] | INTRAMUSCULAR | Status: DC | PRN
  Administered 2020-06-24: 5000 [IU] via INTRAVENOUS_CENTRAL
  Administered 2020-06-26: 4200 [IU] via INTRAVENOUS_CENTRAL
  Filled 2020-06-23 (×3): qty 6
  Filled 2020-06-23 (×2): qty 5
  Filled 2020-06-23: qty 3
  Filled 2020-06-23: qty 6

## 2020-06-23 MED ORDER — POTASSIUM CHLORIDE 20 MEQ PO PACK
20.0000 meq | PACK | Freq: Once | ORAL | Status: AC
  Administered 2020-06-23: 20 meq
  Filled 2020-06-23: qty 1

## 2020-06-23 MED ORDER — ONDANSETRON HCL 4 MG PO TABS: 4.0000 mg | ORAL_TABLET | Freq: Four times a day (QID) | ORAL | Status: DC | PRN

## 2020-06-23 MED ORDER — NOREPINEPHRINE 4 MG/250ML-% IV SOLN
0.0000 ug/min | INTRAVENOUS | Status: DC
  Administered 2020-06-23: 16 ug/min via INTRAVENOUS
  Filled 2020-06-23: qty 250

## 2020-06-23 MED ORDER — ONDANSETRON HCL 4 MG/2ML IJ SOLN
4.0000 mg | Freq: Four times a day (QID) | INTRAMUSCULAR | Status: DC | PRN
  Administered 2020-06-27: 4 mg via INTRAVENOUS
  Filled 2020-06-23 (×2): qty 2

## 2020-06-23 MED ORDER — NOREPINEPHRINE 4 MG/250ML-% IV SOLN
INTRAVENOUS | Status: AC
  Administered 2020-06-23: 20 ug/min via INTRAVENOUS
  Filled 2020-06-23: qty 250

## 2020-06-23 MED ORDER — INSULIN ASPART 100 UNIT/ML IJ SOLN
1.0000 [IU] | INTRAMUSCULAR | Status: DC
  Administered 2020-06-24: 3 [IU] via SUBCUTANEOUS
  Administered 2020-06-24 (×2): 1 [IU] via SUBCUTANEOUS
  Administered 2020-06-25: 3 [IU] via SUBCUTANEOUS
  Administered 2020-06-25: 1 [IU] via SUBCUTANEOUS
  Administered 2020-06-25: 2 [IU] via SUBCUTANEOUS
  Administered 2020-06-25 (×2): 3 [IU] via SUBCUTANEOUS
  Administered 2020-06-25: 2 [IU] via SUBCUTANEOUS
  Administered 2020-06-26 (×2): 3 [IU] via SUBCUTANEOUS

## 2020-06-23 MED ORDER — VALPROATE SODIUM 100 MG/ML IV SOLN
1500.0000 mg | Freq: Once | INTRAVENOUS | Status: AC
  Administered 2020-06-23: 1500 mg via INTRAVENOUS
  Filled 2020-06-23: qty 15

## 2020-06-23 MED ORDER — SODIUM CHLORIDE 0.9% FLUSH
10.0000 mL | Freq: Two times a day (BID) | INTRAVENOUS | Status: DC
  Administered 2020-06-23 – 2020-06-26 (×6): 10 mL
  Administered 2020-06-26: 30 mL
  Administered 2020-06-27 – 2020-06-29 (×6): 10 mL
  Administered 2020-06-30: 40 mL
  Administered 2020-07-02: 20 mL

## 2020-06-23 MED ORDER — SODIUM CHLORIDE 0.9 % FOR CRRT: INTRAVENOUS_CENTRAL | Status: DC | PRN

## 2020-06-23 MED ORDER — LIDOCAINE HCL (PF) 1 % IJ SOLN
INTRAMUSCULAR | Status: AC | PRN
  Administered 2020-06-23: 10 mL

## 2020-06-23 MED ORDER — CHLORHEXIDINE GLUCONATE 0.12% ORAL RINSE (MEDLINE KIT)
15.0000 mL | Freq: Two times a day (BID) | OROMUCOSAL | Status: DC
  Administered 2020-06-23 – 2020-07-02 (×19): 15 mL via OROMUCOSAL

## 2020-06-23 MED ORDER — MUPIROCIN 2 % EX OINT
1.0000 "application " | TOPICAL_OINTMENT | Freq: Two times a day (BID) | CUTANEOUS | Status: AC
  Administered 2020-06-23 – 2020-06-28 (×10): 1 via NASAL
  Filled 2020-06-23 (×2): qty 22

## 2020-06-23 MED ORDER — LIDOCAINE HCL 1 % IJ SOLN
INTRAMUSCULAR | Status: AC
  Filled 2020-06-23: qty 20

## 2020-06-23 MED ORDER — ACETAMINOPHEN 325 MG PO TABS
650.0000 mg | ORAL_TABLET | Freq: Four times a day (QID) | ORAL | Status: DC | PRN
  Administered 2020-06-26 – 2020-06-28 (×4): 650 mg
  Filled 2020-06-23 (×4): qty 2

## 2020-06-23 MED ORDER — INSULIN DETEMIR 100 UNIT/ML ~~LOC~~ SOLN
5.0000 [IU] | Freq: Two times a day (BID) | SUBCUTANEOUS | Status: DC
  Administered 2020-06-23 – 2020-06-26 (×6): 5 [IU] via SUBCUTANEOUS
  Filled 2020-06-23 (×7): qty 0.05

## 2020-06-23 MED ORDER — SODIUM CHLORIDE 0.9 % IV BOLUS
500.0000 mL | Freq: Once | INTRAVENOUS | Status: AC
  Administered 2020-06-23: 500 mL via INTRAVENOUS

## 2020-06-23 MED ORDER — TRAZODONE HCL 50 MG PO TABS: 25.0000 mg | ORAL_TABLET | Freq: Every day | ORAL | Status: DC

## 2020-06-23 MED ORDER — CALCITRIOL 1 MCG/ML PO SOLN
0.2500 ug | ORAL | Status: DC
  Administered 2020-06-25 – 2020-07-02 (×4): 0.25 ug
  Filled 2020-06-23 (×4): qty 0.25

## 2020-06-23 MED ORDER — DEXTROSE 50 % IV SOLN: 0.0000 mL | INTRAVENOUS | Status: DC | PRN

## 2020-06-23 MED ORDER — HEPARIN SODIUM (PORCINE) 5000 UNIT/ML IJ SOLN
5000.0000 [IU] | Freq: Three times a day (TID) | INTRAMUSCULAR | Status: DC
  Administered 2020-06-24 – 2020-07-02 (×23): 5000 [IU] via SUBCUTANEOUS
  Filled 2020-06-23 (×24): qty 1

## 2020-06-23 MED ORDER — NOREPINEPHRINE 4 MG/250ML-% IV SOLN: 0.0000 ug/min | INTRAVENOUS | Status: DC

## 2020-06-23 MED ORDER — LEVETIRACETAM IN NACL 500 MG/100ML IV SOLN
500.0000 mg | Freq: Two times a day (BID) | INTRAVENOUS | Status: DC
  Administered 2020-06-23: 500 mg via INTRAVENOUS
  Filled 2020-06-23: qty 100

## 2020-06-23 MED ORDER — CITALOPRAM HYDROBROMIDE 20 MG PO TABS: 20.0000 mg | ORAL_TABLET | Freq: Every day | ORAL | Status: DC

## 2020-06-23 MED ORDER — INSULIN REGULAR(HUMAN) IN NACL 100-0.9 UT/100ML-% IV SOLN
INTRAVENOUS | Status: DC
  Administered 2020-06-23: 6.5 [IU]/h via INTRAVENOUS
  Administered 2020-06-23: 11.5 [IU]/h via INTRAVENOUS
  Filled 2020-06-23 (×2): qty 100

## 2020-06-23 MED ORDER — PROSOURCE TF PO LIQD
45.0000 mL | Freq: Two times a day (BID) | ORAL | Status: DC
  Administered 2020-06-24: 45 mL
  Filled 2020-06-23: qty 45

## 2020-06-23 MED ORDER — SODIUM CHLORIDE 0.9 % IV SOLN
2.0000 g | Freq: Two times a day (BID) | INTRAVENOUS | Status: DC
  Administered 2020-06-23 – 2020-06-26 (×7): 2 g via INTRAVENOUS
  Filled 2020-06-23 (×7): qty 2

## 2020-06-23 NOTE — Code Documentation (Signed)
Called by CT tech for unresponsive patient with faint pulse.Instructed tech on my way call a code.    Upon arrival patient was noted to be apneic and pulseless.  Patient also had an enlarged tongue that was protruding from mouth.   CPR began and instructed CT tech to grab ambu bag and pull the Code Blue button  ACLS protocol began.    Patient initially in PEA then VFib then Asystole until approx 26 mins of CPR  9 epis 2 calcium, 1 bicarb, 2 defib, Mag 2grams, D50 amp, with ROSC achieved.   Patient was intubated by DR Reather Converse and patient was transported to [facility] .

## 2020-06-23 NOTE — Procedures (Signed)
Pre procedural Dx: Acute cholecysitis Post procedural Dx: Same  Technically successful US and Fluoro guided placement of a 10 Fr drainage catheter placement into the gallbladder lumen. Chole tube connected to gravity bag.  EBL: None  Complications: None immediate  Jay Romana Deaton, MD Pager #: [phone]   

## 2020-06-23 NOTE — Progress Notes (Signed)
[facility] Progress Note Patient Name: MONTEZ CUDA DOB: [birth year] MRN: [id number]   Date of Service  06/23/2020  HPI/Events of Note  Recurrence of hypotension and bradycardia Given epi 0.2 with robust response  eICU Interventions  Continue to titrate up epinephrine and titrated down norepinephrine to off     Intervention Category Major Interventions: Hypotension - evaluation and management  Judd Lien 06/23/2020, 1:28 AM

## 2020-06-23 NOTE — Progress Notes (Signed)
eLink Physician-Brief Progress Note Patient Name: Daniel Thomas DOB: [birth year] MRN: [id number]   Date of Service  06/23/2020  HPI/Events of Note  Spoke with brother through video. Patien does not have wife nor children. Updated him that patient has been requiring epinephrine pushes to keep BP and HR up. Also updated him of EEG findings as relayed by Dr Leonel Ramsay of profound generalized cerebral dysfunction seen in anoxic brain injury that is highly correlated with poor prognosis.  eICU Interventions  Brother states they still want everything done as this is the patient's wish. He sites example of his mom in almost the same dire situation that lived for another year.     Intervention Category Major Interventions: Hypotension - evaluation and management Minor Interventions: Communication with other healthcare providers and/or family  Judd Lien 06/23/2020, 2:13 AM

## 2020-06-23 NOTE — Progress Notes (Signed)
RT NOTE: patient does not meet SBT criteria for this AM.  Weaned FIO2 from 100% to 80% based on sats of 100%.  Patient tolerating current ventilator settings well.  Will continue to monitor.

## 2020-06-23 NOTE — Procedures (Signed)
Patient Name: Daniel Thomas  MRN: [id number]  Epilepsy Attending: Lora Havens  Referring Physician/Provider: Dr. Kathrynn Speed Duration: 06/23/2020 0203 to 06/24/2020 0203  Patient history: 65 year old male status post cardiac arrest.  EEG done for seizures.  Level of alertness: Comatose.  AEDs during EEG study: Keppra, Depakote, propofol  Technical aspects: This EEG study was done with scalp electrodes positioned according to the 10-20 International system of electrode placement. Electrical activity was acquired at a sampling rate of 500Hz  and reviewed with a high frequency filter of 70Hz  and a low frequency filter of 1Hz . EEG data were recorded continuously and digitally stored.   Description: EEG initially showed near continuous generalized background suppression.  Every 2 to 15 seconds generalized highly epileptiform bursts were noted.  During the burst patient was noted to have brief generalized whole body jerking consistent with myoclonic seizure. After around 8 AM on 06/23/2020, EEG predominantly showed generalized background suppression. After around 2300 on 06/14/2020, EEG again showed intermittent bursts of high amplitude 3 to 5 Hz sharply contoured theta-delta slowing lasting 0.5-1 second every 10-30 seconds. EEG was not reactive to tactile stimulation. Hyperventilation and photic stimulation were not performed.     ABNORMALITY -Myoclonic seizure, generalized -Burst suppression with highly epileptiform discharges, generalized  IMPRESSION: This study initially showed myoclonic seizures every 2 to 15 seconds. After around 8 AM on 06/23/2020, no further seizures were noted. EEG also showed profound diffuse encephalopathy.  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   [name]

## 2020-06-23 NOTE — Progress Notes (Signed)
LTM EEG hooked up and running - no initial skin breakdown - neuro notified. [facility] monitoring.

## 2020-06-23 NOTE — Progress Notes (Signed)
Stat  EEG complete - results pending.  

## 2020-06-23 NOTE — Progress Notes (Addendum)
Progress Note     Subjective: CC: unfortunate events from overnight noted after anaphylaxis to IV contrast for CT scan and subsequent PEA arrest with suspected hypoxic brain injury now.  Does not respond to pain.   Objective: Vital signs in last 24 hours: Temp:  [98.6 F (37 C)-100.3 F (37.9 C)] 98.7 F (37.1 C) (06/21 0755) Pulse Rate:  [37-97] 87 (06/21 0738) Resp:  [17-25] 21 (06/21 0738) BP: (53-168)/(42-88) 168/68 (06/21 0738) SpO2:  [88 %-100 %] 100 % (06/21 0738) Arterial Line BP: (68-241)/(40-87) 162/63 (06/21 0645) FiO2 (%):  [40 %-100 %] 80 % (06/21 0738) Last BM Date: 06/23/20  Intake/Output from previous day: 06/20 0701 - 06/21 0700 In: 294.1 [P.O.:120; I.V.:174.1] Out: 150 [Emesis/NG output:150] Intake/Output this shift: No intake/output data recorded.  PE: General: critically ill, on vent Heart: regular, rate, and rhythm.   Lungs: Respiratory effort nonlabored on vent Abd: soft, unable to assess tenderness due to unresponsive nature    Lab Results:  Recent Labs    06/22/20 2151 06/22/20 2233 06/23/20 0132 06/23/20 0517  WBC 29.3*  --   --  30.4*  HGB 8.5*   < > 8.8* 8.4*  HCT 26.5*   < > 26.0* 25.7*  PLT 194  --   --  272   < > = values in this interval not displayed.   BMET Recent Labs    06/22/20 2151 06/22/20 2233 06/23/20 0132 06/23/20 0517  NA 124*   < > 124* 125*  K 4.8   < > 5.8* 4.8  CL 86*  --   --  89*  CO2 19*  --   --  18*  GLUCOSE 238*  --   --  359*  BUN 45*  --   --  57*  CREATININE 9.12*  --   --  9.01*  CALCIUM 9.4  --   --  7.6*   < > = values in this interval not displayed.   PT/INR No results for input(s): LABPROT, INR in the last 72 hours. CMP     Component Value Date/Time   NA 125 (L) 06/23/2020 0517   K 4.8 06/23/2020 0517   CL 89 (L) 06/23/2020 0517   CO2 18 (L) 06/23/2020 0517   GLUCOSE 359 (H) 06/23/2020 0517   BUN 57 (H) 06/23/2020 0517   CREATININE 9.01 (H) 06/23/2020 0517   CALCIUM 7.6 (L)  06/23/2020 0517   PROT 5.9 (L) 06/23/2020 0517   ALBUMIN 2.6 (L) 06/23/2020 0517   AST 901 (H) 06/23/2020 0517   ALT 352 (H) 06/23/2020 0517   ALKPHOS 168 (H) 06/23/2020 0517   BILITOT 1.9 (H) 06/23/2020 0517   GFRNONAA 6 (L) 06/23/2020 0517   GFRAA 10 (L) 06/29/2019 0340   Lipase     Component Value Date/Time   LIPASE 40 06/22/2020 0103       Studies/Results: CT ABDOMEN PELVIS W CONTRAST  Result Date: 06/22/2020 CLINICAL DATA:  65 year old male with abdominal pain. EXAM: CT ABDOMEN AND PELVIS WITH CONTRAST TECHNIQUE: Multidetector CT imaging of the abdomen and pelvis was performed using the standard protocol following bolus administration of intravenous contrast. CONTRAST:  120mL OMNIPAQUE IOHEXOL 300 MG/ML  SOLN COMPARISON:  Abdominal ultrasound dated 06/19/2020. CT abdomen pelvis dated 11/10/2019. FINDINGS: Evaluation of this exam is limited due to respiratory motion artifact. Lower chest: Bibasilar linear atelectasis. There is coronary vascular calcification and calcification of the mitral annulus. Partially visualized central venous line with tip at the cavoatrial junction. No  intra-abdominal free air or free fluid. Hepatobiliary: The liver is unremarkable. No intrahepatic biliary dilatation. There is a stone the gallbladder. The gallbladder is distended. There is gallbladder wall edema with pericholecystic fluid and stranding. Findings most consistent with acute cholecystitis. Further evaluation with ultrasound recommended. Pancreas: Unremarkable. No pancreatic ductal dilatation or surrounding inflammatory changes. Spleen: There is an area of hypoenhancement involving the midportion of the spleen most concerning for splenic infarct. Adrenals/Urinary Tract: The adrenal glands unremarkable. Vascular calcification versus small nonobstructing bilateral renal calculi measure up to 3 mm in the lower pole of the right kidney. There is no hydronephrosis on either side. There is slight  heterogeneous enhancement the renal parenchyma. Correlation with urinalysis recommended to exclude UTI. The visualized ureters and urinary bladder appear unremarkable. Stomach/Bowel: There is no bowel obstruction or active inflammation. The appendix is normal. Vascular/Lymphatic: Mild aortoiliac atherosclerotic disease. The IVC is unremarkable. No portal venous gas. There is no adenopathy. Reproductive: Enlarged prostate gland measuring 5 cm in transverse axial diameter. The seminal vesicles are symmetric. Other: Small fat containing bilateral inguinal hernia. There is slight protrusion of sigmoid colon into the left inguinal canal. Musculoskeletal: Osteopenia with degenerative changes of the spine. Old L1 compression fracture. There is compression fracture of inferior endplate of V77, new since the prior CT. Correlation with clinical exam and point tenderness recommended. IMPRESSION: 1. Cholelithiasis with findings most consistent with acute cholecystitis. Further evaluation with ultrasound recommended. 2. 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 3. No bowel obstruction. Normal appendix. 4. Aortic Atherosclerosis (ICD10-I70.0). Electronically Signed   By: Anner Crete M.D.   On: 06/22/2020 21:28   DG CHEST PORT 1 VIEW  Result Date: 06/22/2020 CLINICAL DATA:  Emergent intubation with NG tube placement. EXAM: PORTABLE CHEST 1 VIEW COMPARISON:  Chest radiograph 12/05/2019 FINDINGS: Endotracheal tube tip is 3.7 cm from the carina. Tip and side port of the enteric tube below the diaphragm in the stomach there is a right internal jugular dialysis catheter with tip at the atrial caval junction. Previous left-sided dialysis catheter is no longer seen. Stable heart size and mediastinal contours vascular congestion. No focal airspace disease. No pneumothorax. No acute osseous abnormalities are seen. IMPRESSION: 1. Endotracheal tube tip 3.7 cm from the carina. Enteric tube in place. 2. Right internal jugular dialysis catheter with  tip at the atrial caval junction. 3. Vascular congestion. Electronically Signed   By: Keith Rake M.D.   On: 06/22/2020 22:42   DG Abd Portable 1V  Result Date: 06/22/2020 CLINICAL DATA:  NG tube placement. EXAM: PORTABLE ABDOMEN - 1 VIEW COMPARISON:  Abdominopelvic CT earlier today FINDINGS: Tip and side port of the enteric tube below the diaphragm in the stomach enteric contrast is seen throughout the colon from prior CT. No free air or obstruction. Known gallstone is not well visualized IMPRESSION: Tip and side port of the enteric tube below the diaphragm in the stomach. Electronically Signed   By: Keith Rake M.D.   On: 06/22/2020 22:42   EEG adult  Result Date: 06/23/2020 Greta Doom, MD     06/23/2020  1:54 AM History: 65 yo M s/p cardiac arrest. Sedation: Propofol 5 mcg/kg/min Technique: This is a 21 channel routine scalp EEG performed at the bedside with bipolar and monopolar montages arranged in accordance to the international 10/20 system of electrode placement. One channel was dedicated to EKG recording. Background: The background is diffusely attenuated with occasional epileptiform bursts lasting 1-2 seconds associated with generalized myoclonus.  Photic stimulation: Physiologic driving is not  performed EEG Abnormalities: 1) Burst suppression EEG with myoclonus assocaited with bursts Clinical Interpretation: This EEG demonstrates evidence of profound generalized cerebral dysfunction as can be seen with hypoxic brain injury. In the post-cardiac arrest setting, this EEG is highly correlated with poor prognosis. Levetiracetam and valproic acid can both sometimes be useful in controlling the symptomatic movements, as can propofol. Roland Rack, MD [facility] [phone] If 7pm- 7am, please page neurology on call as listed in [place].    Anti-infectives: Anti-infectives (From admission, onward)    Start     Dose/Rate Route Frequency Ordered Stop   06/22/2020  1030  metroNIDAZOLE (FLAGYL) IVPB 500 mg        500 mg 100 mL/hr over 60 Minutes Intravenous Every 8 hours 07/01/2020 1000     06/22/2020 1030  ceFEPIme (MAXIPIME) 1 g in sodium chloride 0.9 % 100 mL IVPB        1 g 200 mL/hr over 30 Minutes Intravenous Every 24 hours 06/23/2020 1014          Assessment/Plan  Symptomatic Cholelithiasis with possible acute cholecystitis Elevated Lipase without meeting laboratory criteria for Pancreatitis - RUQ [place] w/ cholelithiasis without gallbladder wall thickening or pericholecystitis fluid. +Murphy's sign - LFTs wnl, lipase normal - WBC 30K today post arrest  - patient remains on abx therapy today -unfortunately, given events of overnight, patient is not a surgical candidate.  Could still consider perc drain, but concerned given his neurologic status that this may be a futile effort.  Also brother and patient were very concerned about a drain yesterday. -will continue IV abx therapy at this to treat gallbladder for now.  ADDENDUM: Brother was present several minutes after I left initially.  I updated him on the surgical side of things.  We rediscussed options if IV abx therapy only vs perc chole drain placement.  I made it very clear that even with the perc chole drain treating the gallbladder infection that this will likely not change is prognosis or his neurologic status.  His brother states understanding but would like his gallbladder drained to help with infection source control.  I will write an order for IR to evaluate the case and make a decision on drain placement.   FEN - NPO for now, IVF per primary VTE - SCDs, lovenox ID - Cefepime/Flagyl >>    COPD DM2 ESRD on HD T/T/S HLD HTN S/p PEA arrest now with suspect anoxic brain injury.  Poor prognosis.    LOS: 3 days    Henreitta Cea, [facility] Surgery 06/23/2020, 9:01 AM Please see Amion for pager number during day hours 7:00am-4:30pm

## 2020-06-23 NOTE — Procedures (Signed)
History: 65 yo M s/p cardiac arrest.   Sedation: Propofol 5 mcg/kg/min  Technique: This is a 21 channel routine scalp EEG performed at the bedside with bipolar and monopolar montages arranged in accordance to the international 10/20 system of electrode placement. One channel was dedicated to EKG recording.    Background: The background is diffusely attenuated with occasional epileptiform bursts lasting 1-2 seconds associated with generalized myoclonus.    Photic stimulation: Physiologic driving is not performed   EEG Abnormalities: 1) Burst suppression EEG with myoclonus assocaited with bursts  Clinical Interpretation: This EEG demonstrates evidence of profound generalized cerebral dysfunction as can be seen with hypoxic brain injury. In the post-cardiac arrest setting, this EEG is highly correlated with poor prognosis.   Levetiracetam and valproic acid can both sometimes be useful in controlling the symptomatic movements, as can propofol.   Roland Rack, MD [facility] [phone]  If 7pm- 7am, please page neurology on call as listed in [place].

## 2020-06-23 NOTE — Consult Note (Signed)
NAME:  TEMESGEN WEIGHTMAN, MRN:  [id number], DOB:  02/04/1955, LOS: 3 ADMISSION DATE:  06/21/2020, CONSULTATION DATE:  6/20 REFERRING MD:  Dr. Thereasa Solo, CHIEF COMPLAINT:  N/V; Post Cardiac Arrest   History of Present Illness:  Patient is a 65 yo M with pertinent PMH of COPD, ESRD on dialysis, DM, Heart murmur, HTN, and hyperlipidemia presenting to [facility] on 6/18 with N/V.  Patient arrived to [facility] on 6/18 stating his N/V began yesterday (6/17) during the night. He has not missed any dialysis treatments except for today. Pertinent ED labs show Creatinine of 9.6, Lipase 108. Nephrology consulted for ESRD. RUQ ultrasound show cholithiasis and patient has positive Murphy's sign. Surgery consulted. Patient declined surgery and perc tube placement at this time.   On 6/20, patient underwent CT abdomen with IV/oral contrast to evaluate gallbladder. While in CT patient lost pulse and cardiac arrested for 30 minutes before ROSC. Patient tongue appeared swollen and required intubation with a 6.5 ET tube due to the swelling. Epi, steroids, calcium, mag, and bicarb were given. One shock was given during one intermission with V-fib on rhythm check.   PCCM consulted on 6/20 for post cardiac arrest care and mechanical ventilator/airway management.  Pertinent  Medical History   Past Medical History:  Diagnosis Date   COPD (chronic obstructive pulmonary disease) ([place])    Diabetes mellitus without complication ([place])    Enlarged prostate    ESRD on dialysis ([place])    Heart murmur    High cholesterol    Hypertension     Significant Hospital Events: Including procedures, antibiotic start and stop dates in addition to other pertinent events   6/18: admitted to [facility] with symptomatic cholelithiasis 6/20: CT abdomen with oral contrast ordered; patient cardiac arrested x30 minutes until ROSC and appeared anaphylactic from contrast with swollen tongue. Intubated with 6.5 ETT. Admitted to ICU. Patient went bradycardic and  cardiac arrested again and given 1 atropine, 1 epi, and bicarb given; ROSC after 2 mintues. Myoclonic activity: EEG pending. 6/21: Initiated on CRRT.  Percutaneous cholecystostomy  Interim History / Subjective:   Cardiac arrest yesterday and CT scan likely from septic decompensation versus anaphylaxis.  Continues to have myoclonus.  Objective   Blood pressure 119/61, pulse 79, temperature 98.2 F (36.8 C), temperature source Oral, resp. rate (!) 23, height 5\' 1"  (1.549 m), weight 73 kg, SpO2 100 %.    Vent Mode: PRVC FiO2 (%):  [40 %-100 %] 60 % Set Rate:  [20 bmp] 20 bmp Vt Set:  [420 mL] 420 mL PEEP:  [5 cmH20] 5 cmH20 Plateau Pressure:  [17 cmH20-24 cmH20] 22 cmH20   Intake/Output Summary (Last 24 hours) at 06/23/2020 1302 Last data filed at 06/23/2020 1200 Gross per 24 hour  Intake 2770.25 ml  Output 150 ml  Net 2620.25 ml    Filed Weights   06/19/2020 1000 06/10/2020 2058 06/21/20 0058  Weight: 72.6 kg 75.1 kg 73 kg    Examination: General:  critically ill appearing intubated patient HEENT: MM pink/moist Neuro: non responsive; pupils pin point; stimulus induced myoclonic jerking myoclonic jerking CV: s1s2, RRR PULM:  dim clear BS bilaterally GI: soft, bsx4 active  Extremities: warm/dry, no edema, right subclavian dialysis catheter Skin: no rashes or lesions  Point-of-care echo shows normal LV RV dysfunction Labs/imaging that I havepersonally reviewed  (right click and "Reselect all SmartList Selections" daily)   Continues EEG shows isoelectric tracing. Hyponatremia.  Creatinine elevated at 9.0 Hyperglycemia Metabolic acidosis Mild transaminitis consistent with shock  liver Stress leukocytosis.  Resolved Hospital Problem list     Assessment & Plan:   Critically ill due to cardiac arrest 6/20: 30 minutes ROSC  Possible anaphylactic shock: tongue swelling after oral contrast 6/20 Anoxic encephalopathy with subcortical myoclonus P: Acute respiratory failure s/p  anaphylactic cardiac arrest: Intubated with 6.5  Hx of COPD Symptomatic Cholelithiasis w/ possible acute Cholecystitis Elevated lipase (improving) ESRD with hx of HTN Anemia of chronic renal DM2 uncontrolled  Plan:  -Continue full ventilatory support.  Mental status precludes extubation -As patient is now hypertensive, wean vasopressors starting with epinephrine. -Continue propofol, titrate to myoclonus suppression.  Continue Keppra and add valproate. -Persistent myoclonus past 48 hours portends a very poor prognosis. -Initiate CRRT today -Patient's brother has consented for percutaneous cholecystostomy -Insulin infusion.  Best Practice (right click and "Reselect all SmartList Selections" daily)   Diet/type: NPO Pain/Anxiety/Delirium protocol RASS goal: 0 on propofol for myoclonus suppression VAP protocol (if indicated): Yes DVT prophylaxis: LMWH GI prophylaxis: H2B Glucose control:  SSI Central venous access:  Yes, and it is still needed Arterial line:  Yes, and it is still needed Foley:  N/A Mobility:  bed rest  PT consulted: N/A Studies pending: EEG Culture data pending:none Last reviewed culture data:today Antibiotics:cefepime and flagyl  Antibiotic de-escalation: no,  continue current rx Stop date: to be determined  Daily labs: ordered Code Status:  full code Last date of multidisciplinary goals of care discussion [pending] ccm prognosis: Serious Disposition: remains critically ill, will stay in intensive care  I have updated patient's brother and explained that the patient is at high risk for further deterioration and poor neurological outcome.  He continues to request aggressive medical care.  Labs   CBC: Recent Labs  Lab 06/16/2020 0720 06/21/20 0233 06/22/20 0103 06/22/20 2151 06/22/20 2233 06/23/20 0132 06/23/20 0517  WBC 10.1 17.5* 25.8* 29.3*  --   --  30.4*  NEUTROABS 8.7*  --   --  22.4*  --   --   --   HGB 10.4* 9.1* 8.6* 8.5* 8.5* 8.8* 8.4*   HCT 32.1* 27.3* 26.1* 26.5* 25.0* 26.0* 25.7*  MCV 94.4 92.5 93.5 97.8  --   --  94.5  PLT 335 293 283 194  --   --  272     Basic Metabolic Panel: Recent Labs  Lab 06/08/2020 0720 06/21/20 0233 06/22/20 0103 06/22/20 2151 06/22/20 2233 06/23/20 0001 06/23/20 0132 06/23/20 0517  NA 135 133* 128* 124* 122*  --  124* 125*  K 4.4 3.7 4.0 4.8 4.9  --  5.8* 4.8  CL 95* 95* 92* 86*  --   --   --  89*  CO2 24 29 23  19*  --   --   --  18*  GLUCOSE 156* 123* 107* 238*  --   --   --  359*  BUN 45* 14 30* 45*  --   --   --  57*  CREATININE 9.62* 4.54* 7.30* 9.12*  --   --   --  9.01*  CALCIUM 8.3* 8.2* 7.7* 9.4  --   --   --  7.6*  MG  --   --   --   --   --  2.5*  --  2.3    GFR: Estimated Creatinine Clearance: 7.1 mL/min (A) (by C-G formula based on SCr of 9.01 mg/dL (H)). Recent Labs  Lab 06/21/20 0233 06/22/20 0103 06/22/20 2151 06/23/20 0517  WBC 17.5* 25.8* 29.3* 30.4*  LATICACIDVEN  --   --  8.1* 4.2*     Liver Function Tests: Recent Labs  Lab 06/25/2020 0720 06/21/20 0233 06/22/20 0103 06/22/20 2151 06/23/20 0517  AST 20 20 17  275* 901*  ALT 20 22 16  138* 352*  ALKPHOS 111 98 87 107 168*  BILITOT 0.7 1.0 1.1 1.0 1.9*  PROT 7.4 6.7 6.2* 5.5* 5.9*  ALBUMIN 3.8 3.2* 2.9* 2.5* 2.6*    Recent Labs  Lab 06/09/2020 0720 06/21/20 0233 06/22/20 0103  LIPASE 108* 87* 40    No results for input(s): AMMONIA in the last 168 hours.  ABG    Component Value Date/Time   PHART 7.434 06/23/2020 0132   PCO2ART 32.4 06/23/2020 0132   PO2ART 326 (H) 06/23/2020 0132   HCO3 21.7 06/23/2020 0132   TCO2 23 06/23/2020 0132   ACIDBASEDEF 2.0 06/23/2020 0132   O2SAT 100.0 06/23/2020 0132      Coagulation Profile: No results for input(s): INR, PROTIME in the last 168 hours.  Cardiac Enzymes: No results for input(s): CKTOTAL, CKMB, CKMBINDEX, TROPONINI in the last 168 hours.  HbA1C: Hgb A1c MFr Bld  Date/Time Value Ref Range Status  12/06/2019 03:57 AM 6.2 (H) 4.8 -  5.6 % Final    Comment:    (NOTE) Pre diabetes:          5.7%-6.4%  Diabetes:              >6.4%  Glycemic control for   <7.0% adults with diabetes   01/07/2018 02:11 PM 8.5 (H) 4.8 - 5.6 % Final    Comment:    (NOTE) Pre diabetes:          5.7%-6.4% Diabetes:              >6.4% Glycemic control for   <7.0% adults with diabetes     CBG: Recent Labs  Lab 06/23/20 0753 06/23/20 0837 06/23/20 0936 06/23/20 1046 06/23/20 1157  GLUCAP 398* 417* 394* 347* 305*   CRITICAL CARE Performed by: Kipp Brood   Total critical care time: 40 minutes  Critical care time was exclusive of separately billable procedures and treating other patients.  Critical care was necessary to treat or prevent imminent or life-threatening deterioration.  Critical care was time spent personally by me on the following activities: development of treatment plan with patient and/or surrogate as well as nursing, discussions with consultants, evaluation of patient's response to treatment, examination of patient, obtaining history from patient or surrogate, ordering and performing treatments and interventions, ordering and review of laboratory studies, ordering and review of radiographic studies, pulse oximetry, re-evaluation of patient's condition and participation in multidisciplinary rounds.  Kipp Brood, MD [facility] ICU Physician [place]  Pager: [phone] Mobile: [phone] After hours: [phone].

## 2020-06-23 NOTE — Progress Notes (Signed)
PHARMACY NOTE:  ANTIMICROBIAL RENAL DOSAGE ADJUSTMENT  Current antimicrobial regimen includes a mismatch between antimicrobial dosage and estimated renal function.  As per policy approved by the Pharmacy & Therapeutics and Medical Executive Committees, the antimicrobial dosage will be adjusted accordingly.  Current antimicrobial dosage:  cefepime 1g qHS  Indication: IAI  Renal Function:  Estimated Creatinine Clearance: 7.1 mL/min (A) (by C-G formula based on SCr of 9.01 mg/dL (H)). []      On intermittent HD, scheduled: [x]      On CRRT    Antimicrobial dosage has been changed to:  Cefepime 2g q12h  Additional comments:   Thank you for allowing pharmacy to be a part of this patient's care.  Arrie Senate, PharmD, BCPS, [facility] Clinical Pharmacist [phone] Please check AMION for all [place] numbers 06/23/2020

## 2020-06-23 NOTE — Progress Notes (Signed)
[facility] Progress Note   Subjective:    Cardiac arrest yesterday, prolonged code until ROSC Thought to be anaphylaxis from IV contrast Post code with poor neuro exam and EEG is very worrisome Hypotensive on Epi, NE gtt Most recent labs  K4.8, SNa 125, HCO3 18, BUN 57 80% FIO2 Brother Mervis at bedside, updated  Objective Vitals:   06/23/20 0630 06/23/20 0645 06/23/20 0738 06/23/20 0755  BP: (!) 155/71 (!) 145/74 (!) 168/68   Pulse: 87 87 87   Resp: 19 20 (!) 21   Temp:    98.7 F (37.1 C)  TempSrc:    Oral  SpO2: 100% 100% 100%   Weight:      Height:       Physical Exam General: intubated, sedated Heart: Regular Lungs: Coarse bs b/l Abdomen: Round, distended but soft, active bowel sounds Extremities: No edema bilateral lower extremities Dialysis Access: [facility] R chest  NEURO: encephalopathic  Filed Weights   06/13/2020 1000 06/12/2020 2058 06/21/20 0058  Weight: 72.6 kg 75.1 kg 73 kg    Intake/Output Summary (Last 24 hours) at 06/23/2020 0900 Last data filed at 06/23/2020 0300 Gross per 24 hour  Intake 174.08 ml  Output 150 ml  Net 24.08 ml     Additional Objective Labs: Basic Metabolic Panel: Recent Labs  Lab 06/22/20 0103 06/22/20 2151 06/22/20 2233 06/23/20 0132 06/23/20 0517  NA 128* 124* 122* 124* 125*  K 4.0 4.8 4.9 5.8* 4.8  CL 92* 86*  --   --  89*  CO2 23 19*  --   --  18*  GLUCOSE 107* 238*  --   --  359*  BUN 30* 45*  --   --  57*  CREATININE 7.30* 9.12*  --   --  9.01*  CALCIUM 7.7* 9.4  --   --  7.6*    Liver Function Tests: Recent Labs  Lab 06/22/20 0103 06/22/20 2151 06/23/20 0517  AST 17 275* 901*  ALT 16 138* 352*  ALKPHOS 87 107 168*  BILITOT 1.1 1.0 1.9*  PROT 6.2* 5.5* 5.9*  ALBUMIN 2.9* 2.5* 2.6*    Recent Labs  Lab 06/26/2020 0720 06/21/20 0233 06/22/20 0103  LIPASE 108* 87* 40    CBC: Recent Labs  Lab 06/05/2020 0720 06/21/20 0233 06/22/20 0103 06/22/20 2151 06/22/20 2233 06/23/20 0132  06/23/20 0517  WBC 10.1 17.5* 25.8* 29.3*  --   --  30.4*  NEUTROABS 8.7*  --   --  22.4*  --   --   --   HGB 10.4* 9.1* 8.6* 8.5* 8.5* 8.8* 8.4*  HCT 32.1* 27.3* 26.1* 26.5* 25.0* 26.0* 25.7*  MCV 94.4 92.5 93.5 97.8  --   --  94.5  PLT 335 293 283 194  --   --  272    Blood Culture    Component Value Date/Time   SDES BLOOD RIGHT ANTECUBITAL 12/09/2019 0842   SPECREQUEST  12/09/2019 0842    BOTTLES DRAWN AEROBIC AND ANAEROBIC Blood Culture adequate volume   CULT  12/09/2019 0842    NO GROWTH 5 DAYS Performed at [facility], [address] [place], [place] [address]    REPTSTATUS 12/14/2019 FINAL 12/09/2019 0842    Cardiac Enzymes: No results for input(s): CKTOTAL, CKMB, CKMBINDEX, TROPONINI in the last 168 hours. CBG: Recent Labs  Lab 06/22/20 1147 06/22/20 1653 06/22/20 2229 06/23/20 0620 06/23/20 0753  GLUCAP 126* 119* 207* 363* 398*    Iron Studies: No results for input(s):  IRON, TIBC, TRANSFERRIN, FERRITIN in the last 72 hours. Lab Results  Component Value Date   INR 1.1 12/05/2019   INR 1.1 05/12/2019   Studies/Results: CT ABDOMEN PELVIS W CONTRAST  Result Date: 06/22/2020 CLINICAL DATA:  65 year old male with abdominal pain. EXAM: CT ABDOMEN AND PELVIS WITH CONTRAST TECHNIQUE: Multidetector CT imaging of the abdomen and pelvis was performed using the standard protocol following bolus administration of intravenous contrast. CONTRAST:  188mL OMNIPAQUE IOHEXOL 300 MG/ML  SOLN COMPARISON:  Abdominal ultrasound dated 06/23/2020. CT abdomen pelvis dated 11/10/2019. FINDINGS: Evaluation of this exam is limited due to respiratory motion artifact. Lower chest: Bibasilar linear atelectasis. There is coronary vascular calcification and calcification of the mitral annulus. Partially visualized central venous line with tip at the cavoatrial junction. No intra-abdominal free air or free fluid. Hepatobiliary: The liver is unremarkable. No intrahepatic biliary  dilatation. There is a stone the gallbladder. The gallbladder is distended. There is gallbladder wall edema with pericholecystic fluid and stranding. Findings most consistent with acute cholecystitis. Further evaluation with ultrasound recommended. Pancreas: Unremarkable. No pancreatic ductal dilatation or surrounding inflammatory changes. Spleen: There is an area of hypoenhancement involving the midportion of the spleen most concerning for splenic infarct. Adrenals/Urinary Tract: The adrenal glands unremarkable. Vascular calcification versus small nonobstructing bilateral renal calculi measure up to 3 mm in the lower pole of the right kidney. There is no hydronephrosis on either side. There is slight heterogeneous enhancement the renal parenchyma. Correlation with urinalysis recommended to exclude UTI. The visualized ureters and urinary bladder appear unremarkable. Stomach/Bowel: There is no bowel obstruction or active inflammation. The appendix is normal. Vascular/Lymphatic: Mild aortoiliac atherosclerotic disease. The IVC is unremarkable. No portal venous gas. There is no adenopathy. Reproductive: Enlarged prostate gland measuring 5 cm in transverse axial diameter. The seminal vesicles are symmetric. Other: Small fat containing bilateral inguinal hernia. There is slight protrusion of sigmoid colon into the left inguinal canal. Musculoskeletal: Osteopenia with degenerative changes of the spine. Old L1 compression fracture. There is compression fracture of inferior endplate of V69, new since the prior CT. Correlation with clinical exam and point tenderness recommended. IMPRESSION: 1. Cholelithiasis with findings most consistent with acute cholecystitis. Further evaluation with ultrasound recommended. 2. 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 3. No bowel obstruction. Normal appendix. 4. Aortic Atherosclerosis (ICD10-I70.0). Electronically Signed   By: Anner Crete M.D.   On: 06/22/2020 21:28   DG CHEST PORT 1  VIEW  Result Date: 06/22/2020 CLINICAL DATA:  Emergent intubation with NG tube placement. EXAM: PORTABLE CHEST 1 VIEW COMPARISON:  Chest radiograph 12/05/2019 FINDINGS: Endotracheal tube tip is 3.7 cm from the carina. Tip and side port of the enteric tube below the diaphragm in the stomach there is a right internal jugular dialysis catheter with tip at the atrial caval junction. Previous left-sided dialysis catheter is no longer seen. Stable heart size and mediastinal contours vascular congestion. No focal airspace disease. No pneumothorax. No acute osseous abnormalities are seen. IMPRESSION: 1. Endotracheal tube tip 3.7 cm from the carina. Enteric tube in place. 2. Right internal jugular dialysis catheter with tip at the atrial caval junction. 3. Vascular congestion. Electronically Signed   By: Keith Rake M.D.   On: 06/22/2020 22:42   DG Abd Portable 1V  Result Date: 06/22/2020 CLINICAL DATA:  NG tube placement. EXAM: PORTABLE ABDOMEN - 1 VIEW COMPARISON:  Abdominopelvic CT earlier today FINDINGS: Tip and side port of the enteric tube below the diaphragm in the stomach enteric contrast is seen throughout the colon from  prior CT. No free air or obstruction. Known gallstone is not well visualized IMPRESSION: Tip and side port of the enteric tube below the diaphragm in the stomach. Electronically Signed   By: Keith Rake M.D.   On: 06/22/2020 22:42   EEG adult  Result Date: 06/23/2020 Greta Doom, MD     06/23/2020  1:54 AM History: 66 yo M s/p cardiac arrest. Sedation: Propofol 5 mcg/kg/min Technique: This is a 21 channel routine scalp EEG performed at the bedside with bipolar and monopolar montages arranged in accordance to the international 10/20 system of electrode placement. One channel was dedicated to EKG recording. Background: The background is diffusely attenuated with occasional epileptiform bursts lasting 1-2 seconds associated with generalized myoclonus.  Photic stimulation:  Physiologic driving is not performed EEG Abnormalities: 1) Burst suppression EEG with myoclonus assocaited with bursts Clinical Interpretation: This EEG demonstrates evidence of profound generalized cerebral dysfunction as can be seen with hypoxic brain injury. In the post-cardiac arrest setting, this EEG is highly correlated with poor prognosis. Levetiracetam and valproic acid can both sometimes be useful in controlling the symptomatic movements, as can propofol. Roland Rack, MD [facility] [phone] If 7pm- 7am, please page neurology on call as listed in [place].    Medications:  ceFEPime (MAXIPIME) IV 1 g (06/22/20 1133)   epinephrine 20 mcg/min (06/23/20 0654)   famotidine (PEPCID) IV 20 mg (06/23/20 0406)   insulin 11.5 Units/hr (06/23/20 0841)   levETIRAcetam     metronidazole 500 mg (06/23/20 0532)   norepinephrine (LEVOPHED) Adult infusion     propofol (DIPRIVAN) infusion 35 mcg/kg/min (06/23/20 4970)   valproate sodium 1,500 mg (06/23/20 0835)    (feeding supplement) PROSource Plus  30 mL Oral BID BM   [START ON 06/24/2020] calcitRIOL  0.25 mcg Per Tube Once per day on Tue Thu Sat   chlorhexidine gluconate (MEDLINE KIT)  15 mL Mouth Rinse BID   Chlorhexidine Gluconate Cloth  6 each Topical Q0600   darbepoetin (ARANESP) injection - DIALYSIS  60 mcg Intravenous Q Tue-HD   diphenhydrAMINE  25 mg Intravenous Q6H   docusate  100 mg Per Tube BID   [START ON 06/24/2020] heparin injection (subcutaneous)  5,000 Units Subcutaneous Q8H   mouth rinse  15 mL Mouth Rinse 10 times per day   methylPREDNISolone (SOLU-MEDROL) injection  60 mg Intravenous Q12H   polyethylene glycol  17 g Per Tube Daily   valproic acid  250 mg Per Tube TID    Dialysis Orders: TTS [place] [facility] - Dr Smith Mince)  4h 76mn   70.5kg  400/500   2/2.25 bath  TDC  Hep 1000  - calc 0.287m PO q HD  - mircera 50 q4 last 5/31  - venofer 50 /wk  - sensipar 12070mO q HD  Assessment/Plan: S/p  cardiac arrest, prolonged time until ROSC 06/22/20.   Like severe anoxic brain injury, neuro following Acute cholecysttis, on Cefepime/Flagyl, CCS following; likely to need perc drain ESRD: R IJ TDC. [place] at this [place] on 2 pressors Hypotension post #1 on NE and Epi, per CCM Anemia: Hgb now 8.4, transfuse as needed.   CKD-BMD Ca ok,  PCM Mild/mod hyponatremia: should correct slowly with CRRT.    RyaRexene AgentD  [facility] 06/23/2020,9:00 AM  LOS: 3 days

## 2020-06-23 NOTE — Procedures (Signed)
Central Venous Catheter Insertion Procedure Note  Daniel Thomas  [id number]  [birth year]  Date:06/23/20  Time:12:52 AM   Provider Performing:Merrie Epler D Rollene Rotunda   Procedure: Insertion of Non-tunneled Central Venous [phone]) with US guidance (00511)   Indication(s) Medication administration  Consent Risks of the procedure as well as the alternatives and risks of each were explained to the patient and/or caregiver.  Consent for the procedure was obtained and is signed in the bedside chart  Anesthesia Topical only with 1% lidocaine   Timeout Verified patient identification, verified procedure, site/side was marked, verified correct patient position, special equipment/implants available, medications/allergies/relevant history reviewed, required imaging and test results available.  Sterile Technique Maximal sterile technique including full sterile barrier drape, hand hygiene, sterile gown, sterile gloves, mask, hair covering, sterile ultrasound probe cover (if used).  Procedure Description Area of catheter insertion was cleaned with chlorhexidine and draped in sterile fashion.  With real-time ultrasound guidance a central venous catheter was placed into the right femoral vein. Nonpulsatile blood flow and easy flushing noted in all ports.  The catheter was sutured in place and sterile dressing applied.  Complications/Tolerance None; patient tolerated the procedure well. Chest X-ray is ordered to verify placement for internal jugular or subclavian cannulation.   Chest x-ray is not ordered for femoral cannulation.  EBL Minimal  Specimen(s) None  JD Rexene Agent [facility] 06/23/2020, 12:53 AM  Please see [email or web address] for pager details.  From 7A-7P if no response, please call [phone]. After hours, please call ELink [phone].

## 2020-06-23 NOTE — Progress Notes (Signed)
LTM maint complete - no skin breakdown under: Fp1 F3 F4 

## 2020-06-23 NOTE — H&P (Signed)
Chief Complaint: Patient was seen in consultation today for image guided percutaneous cholecystectomy catheter placement  Chief Complaint  Patient presents with   Emesis   at the request of Saverio Danker PA-C  Referring Physician(s): Saverio Danker PA-C  Supervising Physician: Sandi Mariscal  Patient Status: [facility] - In-pt  History of Present Illness: Daniel Thomas is a 65 y.o. male with PHs of end-stage renal disease on dialysis and hospice, COPD, CHF, type 2 diabetes hypertension who came in to ED 6/18 with vomitting and was found to have mildly distended gallbladder with cholelithiasis as well as a positive Murphy sign concerning for acute cholecystitis.  Patient was afebrile with normal WBCc and LFTs in ED. Patient was admitted and general surgery was consulted who discuss treatment options for the cholelithiasis and possible cholecystitis with the patient and his family. Patient and family both declined surgery and ercutaneous cholecystectomy catheter placement at the time of initial consultation. 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 Now patient with trending up leukocytosis with WBCc 30.4 and worsening LFTs.   General surgery met again with the patient and his family this morning, patient's brother would like to proceed with percutaneous cholecystectomy catheter placement to help with infection.   IR was requested for an image guided percutaneous cholecystectomy catheter placement.   Patient sedated and intubated, ROS not discussed.   Past Medical History:  Diagnosis Date   COPD (chronic obstructive pulmonary disease) ([place])    Diabetes mellitus without complication ([place])    Enlarged prostate    ESRD on dialysis ([place])    Heart murmur    High  cholesterol    Hypertension     Past Surgical History:  Procedure Laterality Date   DIALYSIS/PERMA CATHETER INSERTION N/A 01/12/2018   Procedure: DIALYSIS/PERMA CATHETER INSERTION;  Surgeon: Katha Cabal, MD;  Location: [place] [facility];  Service: Cardiovascular;  Laterality: N/A;   DIALYSIS/PERMA CATHETER INSERTION N/A 05/16/2019   Procedure: DIALYSIS/PERMA CATHETER INSERTION;  Surgeon: Algernon Huxley, MD;  Location: [place] [facility];  Service: Cardiovascular;  Laterality: N/A;   DIALYSIS/PERMA CATHETER INSERTION N/A 10/17/2019   Procedure: DIALYSIS/PERMA CATHETER INSERTION;  Surgeon: Algernon Huxley, MD;  Location: [place] [facility];  Service: Cardiovascular;  Laterality: N/A;   DIALYSIS/PERMA CATHETER INSERTION N/A 12/10/2019   Procedure: DIALYSIS/PERMA CATHETER INSERTION;  Surgeon: Katha Cabal, MD;  Location: [place] [facility];  Service: Cardiovascular;  Laterality: N/A;   DIALYSIS/PERMA CATHETER REMOVAL N/A 05/13/2019   Procedure: DIALYSIS/PERMA CATHETER REMOVAL;  Surgeon: Algernon Huxley, MD;  Location: [place] [facility];  Service: Cardiovascular;  Laterality: N/A;   DIALYSIS/PERMA CATHETER REMOVAL N/A 12/06/2019   Procedure: DIALYSIS/PERMA CATHETER REMOVAL;  Surgeon: Algernon Huxley, MD;  Location: [place] [facility];  Service: Cardiovascular;  Laterality: N/A;   TEE WITHOUT CARDIOVERSION N/A 05/15/2019   Procedure: TRANSESOPHAGEAL ECHOCARDIOGRAM (TEE);  Surgeon: Teodoro Spray, MD;  Location: [facility];  Service: Cardiovascular;  Laterality: N/A;    Allergies: Contrast media [iodinated diagnostic agents] and Tape  Medications: Prior to Admission medications   Medication Sig Start Date End Date Taking? Authorizing Provider  albuterol (VENTOLIN HFA) 108 (90 Base) MCG/ACT inhaler Inhale 2 puffs into the lungs every 6 (six) hours as needed for wheezing or shortness of breath.   Yes  [provider]  amLODipine (NORVASC) 10 MG tablet Take 1 tablet (10 mg total)  by mouth at bedtime. 12/05/19  Yes Wieting, Richard, MD  atorvastatin (LIPITOR) 40 MG tablet Take 40 mg by mouth at bedtime.   Yes [provider]  calcium carbonate (TUMS - DOSED IN MG ELEMENTAL CALCIUM) 500 MG chewable tablet Chew 1 tablet by mouth every 6 (six) hours as needed for indigestion.   Yes [provider]  carvedilol (COREG) 12.5 MG tablet Take 12.5 mg by mouth 2 (two) times daily with a meal. 03/31/18  Yes [provider]  citalopram (CELEXA) 20 MG tablet Take 20 mg by mouth daily.   Yes [provider]  ergocalciferol (VITAMIN D2) 1.25 MG (50000 UT) capsule Take by mouth. Administered at analysis 02/02/18  Yes [provider]  ferric citrate (AURYXIA) 1 GM 210 MG(Fe) tablet Take by mouth.  04/29/19  Yes [provider]  fluticasone (FLONASE) 50 MCG/ACT nasal spray Place 1 spray into both nostrils daily. 04/27/20  Yes [provider]  hydrALAZINE (APRESOLINE) 25 MG tablet Take 1 tablet (25 mg total) by mouth every 8 (eight) hours. 05/17/19  Yes Nolberto Hanlon, MD  linagliptin (TRADJENTA) 5 MG TABS tablet Take 5 mg by mouth daily.   Yes [provider]  sevelamer carbonate (RENVELA) 800 MG tablet Take 1,600 mg by mouth 3 (three) times daily with meals. 12/23/19  Yes [provider]  traZODone (DESYREL) 50 MG tablet Take 25 mg by mouth at bedtime.   Yes [provider]  lidocaine (LIDODERM) 5 % Place 1 patch onto the skin every 12 (twelve) hours. Remove & Discard patch within 12 hours or as directed by MD Patient not taking: No sig reported 11/10/19 11/09/20  Laban Emperor, PA-C  losartan (COZAAR) 50 MG tablet Take 1 tablet (50 mg total) by mouth at bedtime. 12/05/19   Loletha Grayer, MD     No family history on file.  Social History   Socioeconomic History   Marital status: Single    Spouse name: Not on file   Number of children: Not on file   Years of education: Not on file   Highest education  level: Not on file  Occupational History   Not on file  Tobacco Use   Smoking status: Former    Years: 20.00    Pack years: 0.00    Types: Cigarettes   Smokeless tobacco: Never  Vaping Use   Vaping Use: Never used  Substance and Sexual Activity   Alcohol use: Never   Drug use: Never   Sexual activity: Not Currently  Other Topics Concern   Not on file  Social History Narrative   Lives home alone, 3 aunts that live next door and brother 1 mile away. Helps commute to appointments.   Social Determinants of Health   Financial Resource Strain: Not on file  Food Insecurity: Not on file  Transportation Needs: Not on file  Physical Activity: Not on file  Stress: Not on file  Social Connections: Not on file     Review of Systems: A 12 point ROS discussed and pertinent positives are indicated in the HPI above.  All other systems are negative.   Vital Signs: BP (!) 168/68   Pulse 87   Temp 98.7 F (37.1 C) (Oral)   Resp (!) 21   Ht _0  (1.549 m)   Wt 160 lb 15 oz (73 kg)   SpO2 100%   BMI 30.41 kg/m  Physical Exam Vitals reviewed.  Constitutional:      Appearance: He is ill-appearing.  HENT:     Head: Normocephalic and atraumatic.  Cardiovascular:     Rate and Rhythm: Normal rate and regular rhythm.     Pulses: Normal pulses.     Heart sounds: Normal heart sounds.  Pulmonary:     Comments: Intubated, breath sound clear bilaterally  Abdominal:     General: Abdomen is flat.     Palpations: Abdomen is soft.  Skin:    General: Skin is warm and dry.     Coloration: Skin is not jaundiced or pale.  Neurological:     Comments: Sedated/intubated        Imaging: CT ABDOMEN PELVIS W CONTRAST  Result Date: 06/22/2020 CLINICAL DATA:  65 year old male with abdominal pain. EXAM: CT ABDOMEN AND PELVIS WITH CONTRAST TECHNIQUE: Multidetector CT imaging of the abdomen and pelvis was performed using the standard protocol following bolus administration of intravenous  contrast. CONTRAST:  138m OMNIPAQUE IOHEXOL 300 MG/ML  SOLN COMPARISON:  Abdominal ultrasound dated 06/23/2020. CT abdomen pelvis dated 11/10/2019. FINDINGS: Evaluation of this exam is limited due to respiratory motion artifact. Lower chest: Bibasilar linear atelectasis. There is coronary vascular calcification and calcification of the mitral annulus. Partially visualized central venous line with tip at the cavoatrial junction. No intra-abdominal free air or free fluid. Hepatobiliary: The liver is unremarkable. No intrahepatic biliary dilatation. There is a stone the gallbladder. The gallbladder is distended. There is gallbladder wall edema with pericholecystic fluid and stranding. Findings most consistent with acute cholecystitis. Further evaluation with ultrasound recommended. Pancreas: Unremarkable. No pancreatic ductal dilatation or surrounding inflammatory changes. Spleen: There is an area of hypoenhancement involving the midportion of the spleen most concerning for splenic infarct. Adrenals/Urinary Tract: The adrenal glands unremarkable. Vascular calcification versus small nonobstructing bilateral renal calculi measure up to 3 mm in the lower pole of the right kidney. There is no hydronephrosis on either side. There is slight heterogeneous enhancement the renal parenchyma. Correlation with urinalysis recommended to exclude UTI. The visualized ureters and urinary bladder appear unremarkable. Stomach/Bowel: There is no bowel obstruction or active inflammation. The appendix is normal. Vascular/Lymphatic: Mild aortoiliac atherosclerotic disease. The IVC is unremarkable. No portal venous gas. There is no adenopathy. Reproductive: Enlarged prostate gland measuring 5 cm in transverse axial diameter. The seminal vesicles are symmetric. Other: Small fat containing bilateral inguinal hernia. There is slight protrusion of sigmoid colon into the left inguinal canal. Musculoskeletal: Osteopenia with degenerative changes  of the spine. Old L1 compression fracture. There is compression fracture of inferior endplate of TP53 new since the prior CT. Correlation with clinical exam and point tenderness recommended. IMPRESSION: 1. Cholelithiasis with findings most consistent with acute cholecystitis. Further evaluation with ultrasound recommended. 2. 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 3. No bowel obstruction. Normal appendix. 4. Aortic Atherosclerosis (ICD10-I70.0). Electronically Signed   By: AAnner CreteM.D.   On: 06/22/2020 21:28   DG CHEST PORT 1 VIEW  Result Date: 06/22/2020 CLINICAL DATA:  Emergent intubation with NG tube placement. EXAM: PORTABLE CHEST 1 VIEW COMPARISON:  Chest radiograph 12/05/2019 FINDINGS: Endotracheal tube tip is 3.7 cm from the carina. Tip and side port of the enteric tube below the diaphragm in the stomach there is a right internal jugular dialysis catheter with tip at the atrial caval junction. Previous left-sided dialysis catheter is no longer seen. Stable heart size and mediastinal contours vascular congestion. No focal airspace disease. No pneumothorax. No acute osseous abnormalities are seen.  IMPRESSION: 1. Endotracheal tube tip 3.7 cm from the carina. Enteric tube in place. 2. Right internal jugular dialysis catheter with tip at the atrial caval junction. 3. Vascular congestion. Electronically Signed   By: Keith Rake M.D.   On: 06/22/2020 22:42   DG Abd Portable 1V  Result Date: 06/22/2020 CLINICAL DATA:  NG tube placement. EXAM: PORTABLE ABDOMEN - 1 VIEW COMPARISON:  Abdominopelvic CT earlier today FINDINGS: Tip and side port of the enteric tube below the diaphragm in the stomach enteric contrast is seen throughout the colon from prior CT. No free air or obstruction. Known gallstone is not well visualized IMPRESSION: Tip and side port of the enteric tube below the diaphragm in the stomach. Electronically Signed   By: Keith Rake M.D.   On: 06/22/2020 22:42   EEG adult  Result Date:  06/23/2020 Greta Doom, MD     06/23/2020  1:54 AM History: 65 yo M s/p cardiac arrest. Sedation: Propofol 5 mcg/kg/min Technique: This is a 21 channel routine scalp EEG performed at the bedside with bipolar and monopolar montages arranged in accordance to the international 10/20 system of electrode placement. One channel was dedicated to EKG recording. Background: The background is diffusely attenuated with occasional epileptiform bursts lasting 1-2 seconds associated with generalized myoclonus.  Photic stimulation: Physiologic driving is not performed EEG Abnormalities: 1) Burst suppression EEG with myoclonus assocaited with bursts Clinical Interpretation: This EEG demonstrates evidence of profound generalized cerebral dysfunction as can be seen with hypoxic brain injury. In the post-cardiac arrest setting, this EEG is highly correlated with poor prognosis. Levetiracetam and valproic acid can both sometimes be useful in controlling the symptomatic movements, as can propofol. Roland Rack, MD [facility] [phone] If 7pm- 7am, please page neurology on call as listed in [place].   US Abdomen Limited RUQ (LIVER/GB)  Result Date: 06/15/2020 CLINICAL DATA:  65 year old with nausea and vomiting. EXAM: ULTRASOUND ABDOMEN LIMITED RIGHT UPPER QUADRANT COMPARISON:  01/15/2018 FINDINGS: Gallbladder: Gallbladder is mildly distended and there are multiple small echogenic foci compatible with shadowing gallstones. Index stone measures approximately 5 mm. Reportedly, the patient has a sonographic Murphy sign. There is no evidence for gallbladder wall thickening or pericholecystic fluid. Evidence for echogenic sludge. Common bile duct: Diameter: 3 mm Liver: No focal lesion identified. Within normal limits in parenchymal echogenicity. Portal vein is patent on color Doppler imaging with normal direction of blood flow towards the liver. Other: None. IMPRESSION: Gallbladder is mildly distended with  cholelithiasis. Reportedly, the patient has a sonographic Murphy sign. Findings are concerning for acute cholecystitis. Electronically Signed   By: Markus Daft M.D.   On: 06/10/2020 08:44    Labs:  CBC: Recent Labs    06/21/20 0233 06/22/20 0103 06/22/20 2151 06/22/20 2233 06/23/20 0132 06/23/20 0517  WBC 17.5* 25.8* 29.3*  --   --  30.4*  HGB 9.1* 8.6* 8.5* 8.5* 8.8* 8.4*  HCT 27.3* 26.1* 26.5* 25.0* 26.0* 25.7*  PLT 293 283 194  --   --  272    COAGS: Recent Labs    12/05/19 0402  INR 1.1  APTT 31    BMP: Recent Labs    06/29/19 0340 10/29/19 1205 06/21/20 0233 06/22/20 0103 06/22/20 2151 06/22/20 2233 06/23/20 0132 06/23/20 0517  NA 137   < > 133* 128* 124* 122* 124* 125*  K 3.4*   < > 3.7 4.0 4.8 4.9 5.8* 4.8  CL 99   < > 95* 92* 86*  --   --  89*  CO2 26   < > 29 23 19*  --   --  18*  GLUCOSE 120*   < > 123* 107* 238*  --   --  359*  BUN 34*   < > 14 30* 45*  --   --  57*  CALCIUM 9.4   < > 8.2* 7.7* 9.4  --   --  7.6*  CREATININE 6.35*   < > 4.54* 7.30* 9.12*  --   --  9.01*  GFRNONAA 9*   < > 14* 8* 6*  --   --  6*  GFRAA 10*  --   --   --   --   --   --   --    < > = values in this interval not displayed.    LIVER FUNCTION TESTS: Recent Labs    06/21/20 0233 06/22/20 0103 06/22/20 2151 06/23/20 0517  BILITOT 1.0 1.1 1.0 1.9*  AST 20 17 275* 901*  ALT 22 16 138* 352*  ALKPHOS 98 87 107 168*  PROT 6.7 6.2* 5.5* 5.9*  ALBUMIN 3.2* 2.9* 2.5* 2.6*    TUMOR MARKERS: No results for input(s): AFPTM, CEA, CA199, CHROMGRNA in the last 8760 hours.  Assessment and Plan: 65 y.o. male with mildly distended gallbladder with cholelithiasis as well as a positive Murphy sign concerning for acute cholecystitis. 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  General surgery has been following the  patient, who discussed treatment options with the patient and his family. The patient and his family initially declined surgery or percutaneous cholecystectomy catheter placement; however, the patient's brother would like to proceed with percutaneous cholecystectomy catheter placement to help with the gall bladder infection.   IR was requested for an image guided percutaneous cholecystectomy catheter placement.  Case was reviewed and approved by Dr. Pascal Lux.  NPO since yesterday.  VSS - on pressors  CBC with WBC 30.4, hgb 8.4, plt 272  CMP with ASL 901, ALT 352, Alk phos 168, T bili 1.9, BUN 57, Crt 9.01, GFR 6  Not on anticoag/antiplt treatment Most recent INR 1.1 on 12/2019  Risks and benefits discussed with the patient and family members including, but not limited to bleeding, infection, gallbladder perforation, bile leak, sepsis or even death.  All of the patient's questions were answered, patient is agreeable to proceed. Consent signed and in chart.   Thank you for this interesting consult.  I greatly enjoyed meeting MEDARDO HASSING and look forward to participating in their care.  A copy of this report was sent to the requesting provider on this date.  Electronically Signed: Tera Mater, PA-C 06/23/2020, 9:52 AM   I spent a total of 40 Minutes   in face to face in clinical consultation, greater than 50% of which was counseling/coordinating care for percutaneous cholecystectomy catheter placement.

## 2020-06-24 ENCOUNTER — Inpatient Hospital Stay (HOSPITAL_COMMUNITY): Payer: Medicare Other

## 2020-06-24 DIAGNOSIS — I469 Cardiac arrest, cause unspecified: Secondary | ICD-10-CM

## 2020-06-24 DIAGNOSIS — K810 Acute cholecystitis: Secondary | ICD-10-CM | POA: Diagnosis not present

## 2020-06-24 DIAGNOSIS — K851 Biliary acute pancreatitis without necrosis or infection: Secondary | ICD-10-CM

## 2020-06-24 DIAGNOSIS — J9601 Acute respiratory failure with hypoxia: Secondary | ICD-10-CM | POA: Diagnosis not present

## 2020-06-24 DIAGNOSIS — Z978 Presence of other specified devices: Secondary | ICD-10-CM | POA: Diagnosis not present

## 2020-06-24 DIAGNOSIS — J439 Emphysema, unspecified: Secondary | ICD-10-CM | POA: Diagnosis not present

## 2020-06-24 LAB — COMPREHENSIVE METABOLIC PANEL
ALT: 240 U/L — ABNORMAL HIGH (ref 0–44)
AST: 207 U/L — ABNORMAL HIGH (ref 15–41)
Albumin: 2.4 g/dL — ABNORMAL LOW (ref 3.5–5.0)
Alkaline Phosphatase: 139 U/L — ABNORMAL HIGH (ref 38–126)
Anion gap: 10 (ref 5–15)
BUN: 40 mg/dL — ABNORMAL HIGH (ref 8–23)
CO2: 23 mmol/L (ref 22–32)
Calcium: 7.9 mg/dL — ABNORMAL LOW (ref 8.9–10.3)
Chloride: 99 mmol/L (ref 98–111)
Creatinine, Ser: 5.29 mg/dL — ABNORMAL HIGH (ref 0.61–1.24)
GFR, Estimated: 11 mL/min — ABNORMAL LOW (ref >60)
Glucose, Bld: 109 mg/dL — ABNORMAL HIGH (ref 70–99)
Potassium: 4.7 mmol/L (ref 3.5–5.1)
Sodium: 132 mmol/L — ABNORMAL LOW (ref 135–145)
Total Bilirubin: 0.8 mg/dL (ref 0.3–1.2)
Total Protein: 5.7 g/dL — ABNORMAL LOW (ref 6.5–8.1)

## 2020-06-24 LAB — GLUCOSE, CAPILLARY
Glucose-Capillary: 100 mg/dL — ABNORMAL HIGH (ref 70–99)
Glucose-Capillary: 104 mg/dL — ABNORMAL HIGH (ref 70–99)
Glucose-Capillary: 105 mg/dL — ABNORMAL HIGH (ref 70–99)
Glucose-Capillary: 110 mg/dL — ABNORMAL HIGH (ref 70–99)
Glucose-Capillary: 136 mg/dL — ABNORMAL HIGH (ref 70–99)
Glucose-Capillary: 138 mg/dL — ABNORMAL HIGH (ref 70–99)
Glucose-Capillary: 213 mg/dL — ABNORMAL HIGH (ref 70–99)

## 2020-06-24 LAB — MAGNESIUM
Magnesium: 2.3 mg/dL (ref 1.7–2.4)
Magnesium: 2.4 mg/dL (ref 1.7–2.4)
Magnesium: 1.8 mg/dL (ref 1.7–2.4)

## 2020-06-24 LAB — ECHOCARDIOGRAM COMPLETE
AR max vel: 1.62 cm2
AV Area VTI: 1.69 cm2
AV Area mean vel: 1.85 cm2
AV Mean grad: 6 mmHg
AV Peak grad: 13.5 mmHg
Ao pk vel: 1.84 m/s
Area-P 1/2: 3.03 cm2
Height: 61 in
S' Lateral: 2.3 cm
Weight: 2920.65 oz

## 2020-06-24 LAB — CBC
HCT: 24.1 % — ABNORMAL LOW (ref 39.0–52.0)
Hemoglobin: 8.3 g/dL — ABNORMAL LOW (ref 13.0–17.0)
MCH: 31.1 pg (ref 26.0–34.0)
MCHC: 34.4 g/dL (ref 30.0–36.0)
MCV: 90.3 fL (ref 80.0–100.0)
Platelets: 289 10*3/uL (ref 150–400)
RBC: 2.67 MIL/uL — ABNORMAL LOW (ref 4.22–5.81)
RDW: 14.6 % (ref 11.5–15.5)
WBC: 22.6 10*3/uL — ABNORMAL HIGH (ref 4.0–10.5)
nRBC: 0 % (ref 0.0–0.2)

## 2020-06-24 LAB — PHOSPHORUS
Phosphorus: 3.8 mg/dL (ref 2.5–4.6)
Phosphorus: 4.1 mg/dL (ref 2.5–4.6)

## 2020-06-24 LAB — RENAL FUNCTION PANEL
Albumin: 1.7 g/dL — ABNORMAL LOW (ref 3.5–5.0)
Anion gap: 6 (ref 5–15)
BUN: 29 mg/dL — ABNORMAL HIGH (ref 8–23)
CO2: 17 mmol/L — ABNORMAL LOW (ref 22–32)
Calcium: 6 mg/dL — CL (ref 8.9–10.3)
Chloride: 114 mmol/L — ABNORMAL HIGH (ref 98–111)
Creatinine, Ser: 3.06 mg/dL — ABNORMAL HIGH (ref 0.61–1.24)
GFR, Estimated: 22 mL/min — ABNORMAL LOW (ref >60)
Glucose, Bld: 112 mg/dL — ABNORMAL HIGH (ref 70–99)
Phosphorus: 2.8 mg/dL (ref 2.5–4.6)
Potassium: 3.4 mmol/L — ABNORMAL LOW (ref 3.5–5.1)
Sodium: 137 mmol/L (ref 135–145)

## 2020-06-24 MED ORDER — VITAL 1.5 CAL PO LIQD
1000.0000 mL | ORAL | Status: DC
  Administered 2020-06-24 – 2020-06-27 (×4): 1000 mL

## 2020-06-24 MED ORDER — PROSOURCE TF PO LIQD
90.0000 mL | Freq: Three times a day (TID) | ORAL | Status: DC
  Administered 2020-06-24 – 2020-07-02 (×24): 90 mL
  Filled 2020-06-24 (×25): qty 90

## 2020-06-24 MED ORDER — POTASSIUM PHOSPHATES 15 MMOLE/5ML IV SOLN
20.0000 mmol | Freq: Once | INTRAVENOUS | Status: DC
  Filled 2020-06-24: qty 6.67

## 2020-06-24 MED ORDER — PROSOURCE TF PO LIQD: 45.0000 mL | Freq: Four times a day (QID) | ORAL | Status: DC

## 2020-06-24 MED ORDER — B COMPLEX-C PO TABS
1.0000 | ORAL_TABLET | Freq: Every day | ORAL | Status: DC
  Administered 2020-06-24 – 2020-07-02 (×8): 1
  Filled 2020-06-24 (×9): qty 1

## 2020-06-24 MED ORDER — VITAL 1.5 CAL PO LIQD: 1000.0000 mL | ORAL | Status: DC

## 2020-06-24 MED ORDER — HYDRALAZINE HCL 20 MG/ML IJ SOLN
10.0000 mg | INTRAMUSCULAR | Status: DC | PRN
  Administered 2020-06-25 – 2020-06-26 (×6): 10 mg via INTRAVENOUS
  Filled 2020-06-24 (×6): qty 1

## 2020-06-24 NOTE — Progress Notes (Signed)
Pt was transported to CT via ventilator.No complications during transport.RT will continue to monitor.

## 2020-06-24 NOTE — Progress Notes (Signed)
  Echocardiogram 2D Echocardiogram has been performed.  Merrie Roof F 06/24/2020, 11:57 AM

## 2020-06-24 NOTE — Progress Notes (Signed)
NAME:  Daniel Thomas, MRN:  [id number], DOB:  Dec 27, 1955, LOS: 4 ADMISSION DATE:  06/12/2020, CONSULTATION DATE:  6/20 REFERRING MD:  Dr. Thereasa Solo, CHIEF COMPLAINT:  N/V; Post Cardiac Arrest   History of Present Illness:  Patient is a 65 yo M with pertinent PMH of COPD, ESRD on dialysis, DM, Heart murmur, HTN, and hyperlipidemia presenting to [facility] on 6/18 with N/V.  Patient arrived to [facility] on 6/18 stating his N/V began yesterday (6/17) during the night. He has not missed any dialysis treatments except for today. Pertinent ED labs show Creatinine of 9.6, Lipase 108. Nephrology consulted for ESRD. RUQ ultrasound show cholithiasis and patient has positive Murphy's sign. Surgery consulted. Patient declined surgery and perc tube placement at this time.   On 6/20, patient underwent CT abdomen with IV/oral contrast to evaluate gallbladder. While in CT patient lost pulse and cardiac arrested for 30 minutes before ROSC. Patient tongue appeared swollen and required intubation with a 6.5 ET tube due to the swelling. Epi, steroids, calcium, mag, and bicarb were given. One shock was given during one intermission with V-fib on rhythm check.   PCCM consulted on 6/20 for post cardiac arrest care and mechanical ventilator/airway management.  Pertinent  Medical History   Past Medical History:  Diagnosis Date   COPD (chronic obstructive pulmonary disease) ([place])    Diabetes mellitus without complication ([place])    Enlarged prostate    ESRD on dialysis ([place])    Heart murmur    High cholesterol    Hypertension     Significant Hospital Events: Including procedures, antibiotic start and stop dates in addition to other pertinent events   6/18: admitted to [facility] with symptomatic cholelithiasis 6/20: CT abdomen with oral contrast ordered; patient cardiac arrested x30 minutes until ROSC and appeared anaphylactic from contrast with swollen tongue. Intubated with 6.5 ETT. Admitted to ICU. Patient went bradycardic and  cardiac arrested again and given 1 atropine, 1 epi, and bicarb given; ROSC after 2 mintues. Myoclonic activity: EEG pending. 6/21: Initiated on CRRT.  Percutaneous cholecystostomy  Interim History / Subjective:  Propofol increased this AM due to vent dyssynchrony.  Currently on 23. Pt remains non-responsive.  On CRRT.  Objective   Blood pressure 110/61, pulse 77, temperature (!) 97.52 F (36.4 C), resp. rate 20, height 5\' 1"  (1.549 m), weight 82.8 kg, SpO2 100 %.    Vent Mode: PRVC FiO2 (%):  [40 %-100 %] 40 % Set Rate:  [20 bmp] 20 bmp Vt Set:  [420 mL] 420 mL PEEP:  [5 cmH20] 5 cmH20 Plateau Pressure:  [20 cmH20-22 cmH20] 21 cmH20   Intake/Output Summary (Last 24 hours) at 06/24/2020 0731 Last data filed at 06/24/2020 0700 Gross per 24 hour  Intake 2394.18 ml  Output 2140 ml  Net 254.18 ml    Filed Weights   06/21/20 0058 06/23/20 1345 06/24/20 0500  Weight: 73 kg 84.2 kg 82.8 kg    Examination: General: Adult male, critically ill, in NAD. Neuro: Sedated, not responsive. HEENT: [place]/AT. Sclerae anicteric. ETT in place. Cardiovascular: RRR, no M/R/G.  Lungs: Respirations even and unlabored.  CTA bilaterally, No W/R/R. Abdomen: Per chole tube C/D/I with minimal bilious output.  BS x 4, soft, NT/ND.  Musculoskeletal: No gross deformities, no edema.  Skin: Intact, warm, no rashes.   Resolved Hospital Problem list     Assessment & Plan:   Critically ill due to cardiac arrest 6/20: 30 minutes ROSC  Possible anaphylactic shock: tongue swelling after oral contrast  6/20 - s/p solumedrol, benadryl, pepcid. - Continue supportive care. - Avoid fevers. - Assess echo. - Solumedrol, Benadryl, Pepcid.  Anoxic encephalopathy with subcortical myoclonus. - Propofol as needed to suppress myoclonus. - Continue Keppra, Valproic Acid. - Follow EEG. - Assess CT head. - Prognostication within next 24 hours pending his neuro recovery.  Acute respiratory failure s/p anaphylactic  cardiac arrest: Intubated with 6.5. Hx of COPD - Continue full vent support. - Mental status precludes weaning trial. - Bronchial hygiene. - Follow CXR.  Symptomatic Cholelithiasis w/ possible acute Cholecystitis - s/p perc chole. Elevated lipase (improving). - Continue perc chole drain to gravity. - Continue Cefepime, Flagyl. - IR following. - Continue supportive care.  ESRD with hx of HTN. - Continue CRRT.  Anemia of chronic renal disease. - Transfuse for Hgb < 7.  DM2 uncontrolled. - SSI.   Best Practice (right click and "Reselect all SmartList Selections" daily)   Diet/type: NPO Pain/Anxiety/Delirium protocol RASS goal: 0 on propofol for myoclonus suppression VAP protocol (if indicated): Yes DVT prophylaxis: LMWH GI prophylaxis: H2B Glucose control:  SSI Central venous access:  Yes, and it is still needed Arterial line:  Yes, and it is still needed Foley:  N/A Mobility:  bed rest  PT consulted: N/A Studies pending: EEG, CT head, echo Culture data pending:none Last reviewed culture data:today Antibiotics:cefepime and flagyl  Antibiotic de-escalation: no,  continue current rx Stop date: to be determined  Daily labs: ordered Code Status:  full code Last date of multidisciplinary goals of care discussion [pending] ccm prognosis: Serious Disposition: remains critically ill, will stay in intensive care  I have updated patient's brother and explained that the patient is at high risk for further deterioration and poor neurological outcome.  He continues to request aggressive medical care.  He is requesting to see the code sheet with time of initial loss of pulses and first response time/  CPR start time.   CC time: 40 min.   Montey Hora, [facility] For pager details, please see AMION or use Epic chat  After 1900, please call [facility] for cross coverage needs 06/24/2020, 7:43 AM

## 2020-06-24 NOTE — Plan of Care (Signed)
  Problem: Education: Goal: Knowledge of General Education information will improve Description: Including pain rating scale, medication(s)/side effects and non-pharmacologic comfort measures Outcome: Not Progressing   Problem: Health Behavior/Discharge Planning: Goal: Ability to manage health-related needs will improve Outcome: Not Progressing   Problem: Clinical Measurements: Goal: Ability to maintain clinical measurements within normal limits will improve Outcome: Progressing Goal: Will remain free from infection Outcome: Progressing Goal: Diagnostic test results will improve Outcome: Progressing Goal: Respiratory complications will improve Outcome: Progressing Goal: Cardiovascular complication will be avoided Outcome: Progressing   Problem: Activity: Goal: Risk for activity intolerance will decrease Outcome: Not Progressing   Problem: Nutrition: Goal: Adequate nutrition will be maintained Outcome: Not Progressing   Problem: Coping: Goal: Level of anxiety will decrease Outcome: Progressing   Problem: Elimination: Goal: Will not experience complications related to bowel motility Outcome: Not Progressing Goal: Will not experience complications related to urinary retention Outcome: Not Applicable   Problem: Pain Managment: Goal: General experience of comfort will improve Outcome: Progressing   Problem: Safety: Goal: Ability to remain free from injury will improve Outcome: Progressing   Problem: Skin Integrity: Goal: Risk for impaired skin integrity will decrease Outcome: Not Progressing   Problem: Education: Goal: Knowledge of disease and its progression will improve Outcome: Not Progressing Goal: Individualized Educational Video(s) Outcome: Not Progressing   Problem: Fluid Volume: Goal: Compliance with measures to maintain balanced fluid volume will improve Outcome: Progressing   Problem: Health Behavior/Discharge Planning: Goal: Ability to manage  health-related needs will improve Outcome: Not Progressing   Problem: Nutritional: Goal: Ability to make healthy dietary choices will improve Outcome: Not Progressing   Problem: Clinical Measurements: Goal: Complications related to the disease process, condition or treatment will be avoided or minimized Outcome: Progressing

## 2020-06-24 NOTE — Progress Notes (Signed)
Referring Physician(s): Saverio Danker ([place])  Supervising Physician: Markus Daft  Patient Status:  [facility] - In-pt  Chief Complaint:  History of acute cholecystitis s/p percutaneous cholecystostomy tube placement in IR 06/23/2020.  Subjective:  Patient laying in bed, intubated with sedation, on CRRT. RN and RT at bedside. Cholecystostomy tube site c/d/i.   Allergies: Contrast media [iodinated diagnostic agents] and Tape  Medications: Prior to Admission medications   Medication Sig Start Date End Date Taking? Authorizing Provider  albuterol (VENTOLIN HFA) 108 (90 Base) MCG/ACT inhaler Inhale 2 puffs into the lungs every 6 (six) hours as needed for wheezing or shortness of breath.   Yes [provider]  amLODipine (NORVASC) 10 MG tablet Take 1 tablet (10 mg total) by mouth at bedtime. 12/05/19  Yes Wieting, Richard, MD  atorvastatin (LIPITOR) 40 MG tablet Take 40 mg by mouth at bedtime.   Yes [provider]  calcium carbonate (TUMS - DOSED IN MG ELEMENTAL CALCIUM) 500 MG chewable tablet Chew 1 tablet by mouth every 6 (six) hours as needed for indigestion.   Yes [provider]  carvedilol (COREG) 12.5 MG tablet Take 12.5 mg by mouth 2 (two) times daily with a meal. 03/31/18  Yes [provider]  citalopram (CELEXA) 20 MG tablet Take 20 mg by mouth daily.   Yes [provider]  ergocalciferol (VITAMIN D2) 1.25 MG (50000 UT) capsule Take by mouth. Administered at analysis 02/02/18  Yes [provider]  ferric citrate (AURYXIA) 1 GM 210 MG(Fe) tablet Take by mouth.  04/29/19  Yes [provider]  fluticasone (FLONASE) 50 MCG/ACT nasal spray Place 1 spray into both nostrils daily. 04/27/20  Yes [provider]  hydrALAZINE (APRESOLINE) 25 MG tablet Take 1 tablet (25 mg total) by mouth every 8 (eight) hours. 05/17/19  Yes Nolberto Hanlon, MD  linagliptin (TRADJENTA) 5 MG TABS tablet Take 5 mg by mouth daily.   Yes [provider]  sevelamer carbonate (RENVELA) 800 MG tablet Take 1,600 mg by mouth 3 (three) times daily with meals. 12/23/19  Yes [provider]  traZODone (DESYREL) 50 MG tablet Take 25 mg by mouth at bedtime.   Yes [provider]  lidocaine (LIDODERM) 5 % Place 1 patch onto the skin every 12 (twelve) hours. Remove & Discard patch within 12 hours or as directed by MD Patient not taking: No sig reported 11/10/19 11/09/20  Laban Emperor, PA-C  losartan (COZAAR) 50 MG tablet Take 1 tablet (50 mg total) by mouth at bedtime. 12/05/19   Loletha Grayer, MD     Vital Signs: BP 111/61 (BP Location: Left Arm)   Pulse 76   Temp (!) 97.34 F (36.3 C)   Resp 16   Ht 5\' 1"  (1.549 m)   Wt 182 lb 8.7 oz (82.8 kg)   SpO2 100%   BMI 34.49 kg/m   Physical Exam Vitals and nursing note reviewed.  Constitutional:      General: He is not in acute distress.    Comments: Intubated/sedated. On CRRT.  Pulmonary:     Effort: Pulmonary effort is normal. No respiratory distress.     Comments: Intubated/sedated. Abdominal:     Comments: Cholecystostomy tube site without erythema, drainage, or active bleeding; approximately 25 cc dark bile in gravity bag.  Skin:    General: Skin is warm and dry.  Neurological:     Comments: Intubated/sedated.    Imaging: CT ABDOMEN PELVIS W CONTRAST  Result Date: 06/22/2020 CLINICAL DATA:  65 year old male with abdominal pain. EXAM: CT ABDOMEN AND PELVIS WITH CONTRAST TECHNIQUE: Multidetector CT imaging of the abdomen and pelvis was performed using the standard protocol following bolus administration of intravenous contrast. CONTRAST:  171mL OMNIPAQUE IOHEXOL 300 MG/ML  SOLN COMPARISON:  Abdominal ultrasound dated 06/09/2020. CT abdomen pelvis dated 11/10/2019. FINDINGS: Evaluation of this exam is limited due to respiratory motion artifact. Lower chest: Bibasilar linear atelectasis. There is coronary vascular calcification and calcification of the  mitral annulus. Partially visualized central venous line with tip at the cavoatrial junction. No intra-abdominal free air or free fluid. Hepatobiliary: The liver is unremarkable. No intrahepatic biliary dilatation. There is a stone the gallbladder. The gallbladder is distended. There is gallbladder wall edema with pericholecystic fluid and stranding. Findings most consistent with acute cholecystitis. Further evaluation with ultrasound recommended. Pancreas: Unremarkable. No pancreatic ductal dilatation or surrounding inflammatory changes. Spleen: There is an area of hypoenhancement involving the midportion of the spleen most concerning for splenic infarct. Adrenals/Urinary Tract: The adrenal glands unremarkable. Vascular calcification versus small nonobstructing bilateral renal calculi measure up to 3 mm in the lower pole of the right kidney. There is no hydronephrosis on either side. There is slight heterogeneous enhancement the renal parenchyma. Correlation with urinalysis recommended to exclude UTI. The visualized ureters and urinary bladder appear unremarkable. Stomach/Bowel: There is no bowel obstruction or active inflammation. The appendix is normal. Vascular/Lymphatic: Mild aortoiliac atherosclerotic disease. The IVC is unremarkable. No portal venous gas. There is no adenopathy. Reproductive: Enlarged prostate gland measuring 5 cm in transverse axial diameter. The seminal vesicles are symmetric. Other: Small fat containing bilateral inguinal hernia. There is slight protrusion of sigmoid colon into the left inguinal canal. Musculoskeletal: Osteopenia with degenerative changes of the spine. Old L1 compression fracture. There is compression fracture of inferior endplate of G62, new since the prior CT. Correlation with clinical exam and point tenderness recommended. IMPRESSION: 1. Cholelithiasis with findings most consistent with acute cholecystitis. Further evaluation with ultrasound recommended. 2. 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 3. No bowel obstruction. Normal appendix. 4. Aortic Atherosclerosis (ICD10-I70.0). Electronically Signed   By: Anner Crete M.D.   On: 06/22/2020 21:28   IR Perc Cholecystostomy  Result Date: 06/23/2020 INDICATION: Acute cholecystitis. Please perform image guided cholecystostomy tube placement for infection source control purposes. EXAM: ULTRASOUND AND FLUOROSCOPIC-GUIDED CHOLECYSTOSTOMY TUBE PLACEMENT COMPARISON:  CT abdomen and pelvis-06/22/2020; right upper quadrant abdominal ultrasound-06/15/2020 MEDICATIONS: The patient is currently admitted to the hospital and on intravenous antibiotics. Antibiotics were administered within an appropriate time frame prior to skin puncture. ANESTHESIA/SEDATION: Patient is currently intubated and sedated in the ICU. CONTRAST:  None, no contrast was administered secondary to patient's anaphylactic reaction to intravenous contrast during abdominal CT performed 06/22/2020. FLUOROSCOPY TIME:  18 seconds (3 mGy) COMPLICATIONS: None immediate. PROCEDURE: Informed written consent was obtained from the patient's family after a discussion of the risks, benefits and alternatives to treatment. Questions regarding the procedure were encouraged and answered. A timeout was performed prior to the initiation of the procedure. The right upper abdominal quadrant was prepped and draped in the usual sterile fashion, and a sterile drape was applied covering the operative field. Maximum barrier sterile technique with sterile gowns and gloves were used for the procedure. A timeout was performed prior to the initiation of the procedure. Local anesthesia was provided with 1% lidocaine with epinephrine. Ultrasound scanning of the right upper quadrant demonstrates a markedly dilated gallbladder with gallbladder wall thickening and minimal amount of pericholecystic fluid. Utilizing a transhepatic approach, a  22 gauge needle was advanced into the gallbladder under direct ultrasound  guidance. An ultrasound image was saved for documentation purposes. Appropriate intraluminal puncture was confirmed with the efflux of bile and advancement of an 0.018 wire into the gallbladder lumen. The needle was exchanged for an [place] set. A small amount of contrast was injected to confirm appropriate intraluminal positioning. Over a [place] wire, a 24.2-French Cook cholecystomy tube was advanced into the gallbladder fossa, coiled and locked. Bile was aspirated and several post procedural spot radiographic images were obtained in various obliquities. Again, no contrast was administered secondary to patient's anaphylaxis to intravenous contrast received during abdominal CT performed 06/22/2020. The catheter was secured to the skin with suture, connected to a drainage bag and a dressing was placed. The patient tolerated the procedure well without immediate post procedural complication. IMPRESSION: Successful ultrasound and fluoroscopic guided placement of a 10.2 French cholecystostomy tube. Electronically Signed   By: Sandi Mariscal M.D.   On: 06/23/2020 14:26   DG CHEST PORT 1 VIEW  Result Date: 06/24/2020 CLINICAL DATA:  Respiratory failure.  Intubation. EXAM: PORTABLE CHEST 1 VIEW COMPARISON:  06/22/2020. FINDINGS: Endotracheal tube tip 1.5 cm above the carina. NG tube tip below left hemidiaphragm. Dialysis catheter in stable position. Cardiomegaly. No pulmonary venous congestion. Low lung volumes with prominent bibasilar subsegmental atelectasis noted on today's exam. No pleural effusion or pneumothorax. IMPRESSION: Endotracheal tube tip 1.5 cm above the carina. NG tube tip below left hemidiaphragm. Dialysis catheter stable position. 2.  Cardiomegaly.  No pulmonary venous congestion. 3. Low lung volumes with prominent bibasilar subsegmental atelectasis noted on today's exam. Electronically Signed   By: Marcello Moores  Register   On: 06/24/2020 07:13   DG CHEST PORT 1 VIEW  Result Date: 06/22/2020 CLINICAL  DATA:  Emergent intubation with NG tube placement. EXAM: PORTABLE CHEST 1 VIEW COMPARISON:  Chest radiograph 12/05/2019 FINDINGS: Endotracheal tube tip is 3.7 cm from the carina. Tip and side port of the enteric tube below the diaphragm in the stomach there is a right internal jugular dialysis catheter with tip at the atrial caval junction. Previous left-sided dialysis catheter is no longer seen. Stable heart size and mediastinal contours vascular congestion. No focal airspace disease. No pneumothorax. No acute osseous abnormalities are seen. IMPRESSION: 1. Endotracheal tube tip 3.7 cm from the carina. Enteric tube in place. 2. Right internal jugular dialysis catheter with tip at the atrial caval junction. 3. Vascular congestion. Electronically Signed   By: Keith Rake M.D.   On: 06/22/2020 22:42   DG Abd Portable 1V  Result Date: 06/22/2020 CLINICAL DATA:  NG tube placement. EXAM: PORTABLE ABDOMEN - 1 VIEW COMPARISON:  Abdominopelvic CT earlier today FINDINGS: Tip and side port of the enteric tube below the diaphragm in the stomach enteric contrast is seen throughout the colon from prior CT. No free air or obstruction. Known gallstone is not well visualized IMPRESSION: Tip and side port of the enteric tube below the diaphragm in the stomach. Electronically Signed   By: Keith Rake M.D.   On: 06/22/2020 22:42   EEG adult  Result Date: 06/23/2020 Greta Doom, MD     06/23/2020  1:54 AM History: 65 yo M s/p cardiac arrest. Sedation: Propofol 5 mcg/kg/min Technique: This is a 21 channel routine scalp EEG performed at the bedside with bipolar and monopolar montages arranged in accordance to the international 10/20 system of electrode placement. One channel was dedicated to EKG recording. Background: The background is diffusely attenuated with occasional  epileptiform bursts lasting 1-2 seconds associated with generalized myoclonus.  Photic stimulation: Physiologic driving is not performed EEG  Abnormalities: 1) Burst suppression EEG with myoclonus assocaited with bursts Clinical Interpretation: This EEG demonstrates evidence of profound generalized cerebral dysfunction as can be seen with hypoxic brain injury. In the post-cardiac arrest setting, this EEG is highly correlated with poor prognosis. Levetiracetam and valproic acid can both sometimes be useful in controlling the symptomatic movements, as can propofol. Roland Rack, MD [facility] [phone] If 7pm- 7am, please page neurology on call as listed in [place].   Overnight EEG with video  Result Date: 06/23/2020 Lora Havens, MD     06/24/2020  8:48 AM Patient Name: ATHENS LEBEAU MRN: [id number] Epilepsy Attending: Lora Havens Referring Physician/Provider: Dr. Kathrynn Speed Duration: 06/23/2020 0203 to 06/24/2020 0203 Patient history: 65 year old male status post cardiac arrest.  EEG done for seizures. Level of alertness: Comatose. AEDs during EEG study: Keppra, Depakote, propofol Technical aspects: This EEG study was done with scalp electrodes positioned according to the 10-20 International system of electrode placement. Electrical activity was acquired at a sampling rate of 500Hz  and reviewed with a high frequency filter of 70Hz  and a low frequency filter of 1Hz . EEG data were recorded continuously and digitally stored. Description: EEG initially showed near continuous generalized background suppression.  Every 2 to 15 seconds generalized highly epileptiform bursts were noted.  During the burst patient was noted to have brief generalized whole body jerking consistent with myoclonic seizure. After around 8 AM on 06/23/2020, EEG predominantly showed generalized background suppression. After around 2300 on 06/14/2020, EEG again showed intermittent bursts of high amplitude 3 to 5 Hz sharply contoured theta-delta slowing lasting 0.5-1 second every 10-30 seconds. EEG was not reactive to tactile stimulation.  Hyperventilation and photic stimulation were not performed.   ABNORMALITY -Myoclonic seizure, generalized -Burst suppression with highly epileptiform discharges, generalized IMPRESSION: This study initially showed myoclonic seizures every 2 to 15 seconds. After around 8 AM on 06/23/2020, no further seizures were noted. EEG also showed profound diffuse encephalopathy.  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 [place]:  CBC: Recent Labs    06/22/20 0103 06/22/20 2151 06/22/20 2233 06/23/20 0132 06/23/20 0517 06/24/20 0316  WBC 25.8* 29.3*  --   --  30.4* 22.6*  HGB 8.6* 8.5* 8.5* 8.8* 8.4* 8.3*  HCT 26.1* 26.5* 25.0* 26.0* 25.7* 24.1*  PLT 283 194  --   --  272 289    COAGS: Recent Labs    12/05/19 0402  INR 1.1  APTT 31    BMP: Recent Labs    06/29/19 0340 10/29/19 1205 06/22/20 2151 06/22/20 2233 06/23/20 0132 06/23/20 0517 06/23/20 1605 06/24/20 0316  NA 137   < > 124*   < > 124* 125* 129* 132*  K 3.4*   < > 4.8   < > 5.8* 4.8 3.7 4.7  CL 99   < > 86*  --   --  89* 93* 99  CO2 26   < > 19*  --   --  18* 20* 23  GLUCOSE 120*   < > 238*  --   --  359* 144* 109*  BUN 34*   < > 45*  --   --  57* 56* 40*  CALCIUM 9.4   < > 9.4  --   --  7.6* 8.0* 7.9*  CREATININE 6.35*   < >  9.12*  --   --  9.01* 8.07* 5.29*  GFRNONAA 9*   < > 6*  --   --  6* 7* 11*  GFRAA 10*  --   --   --   --   --   --   --    < > = values in this interval not displayed.    LIVER FUNCTION TESTS: Recent Labs    06/22/20 0103 06/22/20 2151 06/23/20 0517 06/23/20 1605 06/24/20 0316  BILITOT 1.1 1.0 1.9*  --  0.8  AST 17 275* 901*  --  207*  ALT 16 138* 352*  --  240*  ALKPHOS 87 107 168*  --  139*  PROT 6.2* 5.5* 5.9*  --  5.7*  ALBUMIN 2.9* 2.5* 2.6* 2.5* 2.4*    Assessment and Plan:  History of acute cholecystitis s/p percutaneous cholecystostomy tube placement in IR 06/23/2020. Cholecystostomy tube stable with  approximately 25 cc dark bile in gravity bag (additional 135 cc output from drain in past 24 hours per chart). Continue current drain management- continue with Qshift flushes/monitor of output. Plan for follow-up cholangiogram/possible exchange in 6-8 weeks unless cholecystectomy occurs in the interim. Further plans per CCM/CCS/nephrology/neurology- appreciate and agree with management. IR to follow.   Electronically Signed: Earley Abide, PA-C 06/24/2020, 11:02 AM   I spent a total of 15 Minutes at the the patient's bedside AND on the patient's hospital floor or unit, greater than 50% of which was counseling/coordinating care for acute cholecystitis s/p cholecystostomy tube placement.

## 2020-06-24 NOTE — Progress Notes (Signed)
[facility] Progress Note   Subjective:    Stable on CRRT, not clotting Off pressors AM labs K 4.7 and P 3.8. Brother Mervis at bedside, updated IR did perc biliary drain yesterday  Objective Vitals:   06/24/20 0730 06/24/20 0738 06/24/20 0745 06/24/20 0800  BP:  110/61  111/61  Pulse: 78  78 76  Resp: (!) 26 20 (!) 29 20  Temp: (!) 97.52 F (36.4 C)  (!) 97.52 F (36.4 C) (!) 97.34 F (36.3 C)  TempSrc:    Esophageal  SpO2: 100% 100% 100% 100%  Weight:      Height:       Physical Exam General: intubated, sedated Heart: Regular Lungs: Coarse bs b/l Abdomen: Round, distended but soft, active bowel sounds Extremities: No edema bilateral lower extremities Dialysis Access: [facility] R chest  NEURO: encephalopathic  Filed Weights   06/21/20 0058 06/23/20 1345 06/24/20 0500  Weight: 73 kg 84.2 kg 82.8 kg    Intake/Output Summary (Last 24 hours) at 06/24/2020 0831 Last data filed at 06/24/2020 0800 Gross per 24 hour  Intake 2276.68 ml  Output 2263 ml  Net 13.68 ml     Additional Objective Labs: Basic Metabolic Panel: Recent Labs  Lab 06/23/20 0517 06/23/20 1605 06/24/20 0316  NA 125* 129* 132*  K 4.8 3.7 4.7  CL 89* 93* 99  CO2 18* 20* 23  GLUCOSE 359* 144* 109*  BUN 57* 56* 40*  CREATININE 9.01* 8.07* 5.29*  CALCIUM 7.6* 8.0* 7.9*  PHOS  --  4.5 3.8    Liver Function Tests: Recent Labs  Lab 06/22/20 2151 06/23/20 0517 06/23/20 1605 06/24/20 0316  AST 275* 901*  --  207*  ALT 138* 352*  --  240*  ALKPHOS 107 168*  --  139*  BILITOT 1.0 1.9*  --  0.8  PROT 5.5* 5.9*  --  5.7*  ALBUMIN 2.5* 2.6* 2.5* 2.4*    Recent Labs  Lab 06/19/2020 0720 06/21/20 0233 06/22/20 0103  LIPASE 108* 87* 40    CBC: Recent Labs  Lab 06/28/2020 0720 06/21/20 0233 06/22/20 0103 06/22/20 2151 06/22/20 2233 06/23/20 0132 06/23/20 0517 06/24/20 0316  WBC 10.1 17.5* 25.8* 29.3*  --   --  30.4* 22.6*  NEUTROABS 8.7*  --   --  22.4*  --   --   --    --   HGB 10.4* 9.1* 8.6* 8.5*   < > 8.8* 8.4* 8.3*  HCT 32.1* 27.3* 26.1* 26.5*   < > 26.0* 25.7* 24.1*  MCV 94.4 92.5 93.5 97.8  --   --  94.5 90.3  PLT 335 293 283 194  --   --  272 289   < > = values in this interval not displayed.    Blood Culture    Component Value Date/Time   SDES BLOOD RIGHT ANTECUBITAL 12/09/2019 0842   SPECREQUEST  12/09/2019 0842    BOTTLES DRAWN AEROBIC AND ANAEROBIC Blood Culture adequate volume   CULT  12/09/2019 0842    NO GROWTH 5 DAYS Performed at [facility], [address] [place], [place] [address]    REPTSTATUS 12/14/2019 FINAL 12/09/2019 3570    Cardiac Enzymes: No results for input(s): CKTOTAL, CKMB, CKMBINDEX, TROPONINI in the last 168 hours. CBG: Recent Labs  Lab 06/23/20 2306 06/24/20 0007 06/24/20 0100 06/24/20 0319 06/24/20 0631  GLUCAP 101* 100* 104* 105* 110*    Iron Studies: No results for input(s): IRON, TIBC, TRANSFERRIN, FERRITIN in the last 72  hours. Lab Results  Component Value Date   INR 1.1 12/05/2019   INR 1.1 05/12/2019   Studies/Results: CT ABDOMEN PELVIS W CONTRAST  Result Date: 06/22/2020 CLINICAL DATA:  65 year old male with abdominal pain. EXAM: CT ABDOMEN AND PELVIS WITH CONTRAST TECHNIQUE: Multidetector CT imaging of the abdomen and pelvis was performed using the standard protocol following bolus administration of intravenous contrast. CONTRAST:  133mL OMNIPAQUE IOHEXOL 300 MG/ML  SOLN COMPARISON:  Abdominal ultrasound dated 06/18/2020. CT abdomen pelvis dated 11/10/2019. FINDINGS: Evaluation of this exam is limited due to respiratory motion artifact. Lower chest: Bibasilar linear atelectasis. There is coronary vascular calcification and calcification of the mitral annulus. Partially visualized central venous line with tip at the cavoatrial junction. No intra-abdominal free air or free fluid. Hepatobiliary: The liver is unremarkable. No intrahepatic biliary dilatation. There is a stone the  gallbladder. The gallbladder is distended. There is gallbladder wall edema with pericholecystic fluid and stranding. Findings most consistent with acute cholecystitis. Further evaluation with ultrasound recommended. Pancreas: Unremarkable. No pancreatic ductal dilatation or surrounding inflammatory changes. Spleen: There is an area of hypoenhancement involving the midportion of the spleen most concerning for splenic infarct. Adrenals/Urinary Tract: The adrenal glands unremarkable. Vascular calcification versus small nonobstructing bilateral renal calculi measure up to 3 mm in the lower pole of the right kidney. There is no hydronephrosis on either side. There is slight heterogeneous enhancement the renal parenchyma. Correlation with urinalysis recommended to exclude UTI. The visualized ureters and urinary bladder appear unremarkable. Stomach/Bowel: There is no bowel obstruction or active inflammation. The appendix is normal. Vascular/Lymphatic: Mild aortoiliac atherosclerotic disease. The IVC is unremarkable. No portal venous gas. There is no adenopathy. Reproductive: Enlarged prostate gland measuring 5 cm in transverse axial diameter. The seminal vesicles are symmetric. Other: Small fat containing bilateral inguinal hernia. There is slight protrusion of sigmoid colon into the left inguinal canal. Musculoskeletal: Osteopenia with degenerative changes of the spine. Old L1 compression fracture. There is compression fracture of inferior endplate of Z32, new since the prior CT. Correlation with clinical exam and point tenderness recommended. IMPRESSION: 1. Cholelithiasis with findings most consistent with acute cholecystitis. Further evaluation with ultrasound recommended. 2. 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 3. No bowel obstruction. Normal appendix. 4. Aortic Atherosclerosis (ICD10-I70.0). Electronically Signed   By: Anner Crete M.D.   On: 06/22/2020 21:28   IR Perc Cholecystostomy  Result Date: 06/23/2020 INDICATION:  Acute cholecystitis. Please perform image guided cholecystostomy tube placement for infection source control purposes. EXAM: ULTRASOUND AND FLUOROSCOPIC-GUIDED CHOLECYSTOSTOMY TUBE PLACEMENT COMPARISON:  CT abdomen and pelvis-06/22/2020; right upper quadrant abdominal ultrasound-06/27/2020 MEDICATIONS: The patient is currently admitted to the hospital and on intravenous antibiotics. Antibiotics were administered within an appropriate time frame prior to skin puncture. ANESTHESIA/SEDATION: Patient is currently intubated and sedated in the ICU. CONTRAST:  None, no contrast was administered secondary to patient's anaphylactic reaction to intravenous contrast during abdominal CT performed 06/22/2020. FLUOROSCOPY TIME:  18 seconds (3 mGy) COMPLICATIONS: None immediate. PROCEDURE: Informed written consent was obtained from the patient's family after a discussion of the risks, benefits and alternatives to treatment. Questions regarding the procedure were encouraged and answered. A timeout was performed prior to the initiation of the procedure. The right upper abdominal quadrant was prepped and draped in the usual sterile fashion, and a sterile drape was applied covering the operative field. Maximum barrier sterile technique with sterile gowns and gloves were used for the procedure. A timeout was performed prior to the initiation of the procedure. Local anesthesia was provided  with 1% lidocaine with epinephrine. Ultrasound scanning of the right upper quadrant demonstrates a markedly dilated gallbladder with gallbladder wall thickening and minimal amount of pericholecystic fluid. Utilizing a transhepatic approach, a 22 gauge needle was advanced into the gallbladder under direct ultrasound guidance. An ultrasound image was saved for documentation purposes. Appropriate intraluminal puncture was confirmed with the efflux of bile and advancement of an 0.018 wire into the gallbladder lumen. The needle was exchanged for an  Seymour set. A small amount of contrast was injected to confirm appropriate intraluminal positioning. Over a Benson wire, a 56.2-French Cook cholecystomy tube was advanced into the gallbladder fossa, coiled and locked. Bile was aspirated and several post procedural spot radiographic images were obtained in various obliquities. Again, no contrast was administered secondary to patient's anaphylaxis to intravenous contrast received during abdominal CT performed 06/22/2020. The catheter was secured to the skin with suture, connected to a drainage bag and a dressing was placed. The patient tolerated the procedure well without immediate post procedural complication. IMPRESSION: Successful ultrasound and fluoroscopic guided placement of a 10.2 French cholecystostomy tube. Electronically Signed   By: Sandi Mariscal M.D.   On: 06/23/2020 14:26   DG CHEST PORT 1 VIEW  Result Date: 06/24/2020 CLINICAL DATA:  Respiratory failure.  Intubation. EXAM: PORTABLE CHEST 1 VIEW COMPARISON:  06/22/2020. FINDINGS: Endotracheal tube tip 1.5 cm above the carina. NG tube tip below left hemidiaphragm. Dialysis catheter in stable position. Cardiomegaly. No pulmonary venous congestion. Low lung volumes with prominent bibasilar subsegmental atelectasis noted on today's exam. No pleural effusion or pneumothorax. IMPRESSION: Endotracheal tube tip 1.5 cm above the carina. NG tube tip below left hemidiaphragm. Dialysis catheter stable position. 2.  Cardiomegaly.  No pulmonary venous congestion. 3. Low lung volumes with prominent bibasilar subsegmental atelectasis noted on today's exam. Electronically Signed   By: Marcello Moores  Register   On: 06/24/2020 07:13   DG CHEST PORT 1 VIEW  Result Date: 06/22/2020 CLINICAL DATA:  Emergent intubation with NG tube placement. EXAM: PORTABLE CHEST 1 VIEW COMPARISON:  Chest radiograph 12/05/2019 FINDINGS: Endotracheal tube tip is 3.7 cm from the carina. Tip and side port of the enteric tube below the diaphragm  in the stomach there is a right internal jugular dialysis catheter with tip at the atrial caval junction. Previous left-sided dialysis catheter is no longer seen. Stable heart size and mediastinal contours vascular congestion. No focal airspace disease. No pneumothorax. No acute osseous abnormalities are seen. IMPRESSION: 1. Endotracheal tube tip 3.7 cm from the carina. Enteric tube in place. 2. Right internal jugular dialysis catheter with tip at the atrial caval junction. 3. Vascular congestion. Electronically Signed   By: Keith Rake M.D.   On: 06/22/2020 22:42   DG Abd Portable 1V  Result Date: 06/22/2020 CLINICAL DATA:  NG tube placement. EXAM: PORTABLE ABDOMEN - 1 VIEW COMPARISON:  Abdominopelvic CT earlier today FINDINGS: Tip and side port of the enteric tube below the diaphragm in the stomach enteric contrast is seen throughout the colon from prior CT. No free air or obstruction. Known gallstone is not well visualized IMPRESSION: Tip and side port of the enteric tube below the diaphragm in the stomach. Electronically Signed   By: Keith Rake M.D.   On: 06/22/2020 22:42   EEG adult  Result Date: 06/23/2020 Greta Doom, MD     06/23/2020  1:54 AM History: 65 yo M s/p cardiac arrest. Sedation: Propofol 5 mcg/kg/min Technique: This is a 21 channel routine scalp EEG performed at the  bedside with bipolar and monopolar montages arranged in accordance to the international 10/20 system of electrode placement. One channel was dedicated to EKG recording. Background: The background is diffusely attenuated with occasional epileptiform bursts lasting 1-2 seconds associated with generalized myoclonus.  Photic stimulation: Physiologic driving is not performed EEG Abnormalities: 1) Burst suppression EEG with myoclonus assocaited with bursts Clinical Interpretation: This EEG demonstrates evidence of profound generalized cerebral dysfunction as can be seen with hypoxic brain injury. In the  post-cardiac arrest setting, this EEG is highly correlated with poor prognosis. Levetiracetam and valproic acid can both sometimes be useful in controlling the symptomatic movements, as can propofol. Roland Rack, MD [facility] [phone] If 7pm- 7am, please page neurology on call as listed in [place].    Medications:   prismasol BGK 4/2.5 500 mL/hr at 06/24/20 0056    prismasol BGK 4/2.5 300 mL/hr at 06/24/20 0759   ceFEPime (MAXIPIME) IV Stopped (06/24/20 1696)   famotidine (PEPCID) IV 20 mg (06/24/20 0817)   levETIRAcetam Stopped (06/24/20 0226)   metronidazole Stopped (06/24/20 0330)   prismasol BGK 4/2.5 1,500 mL/hr at 06/24/20 0759   propofol (DIPRIVAN) infusion 40 mcg/kg/min (06/24/20 0800)    artificial tears  1 application Both Eyes V8L   calcitRIOL  0.25 mcg Per Tube Once per day on Tue Thu Sat   chlorhexidine gluconate (MEDLINE KIT)  15 mL Mouth Rinse BID   Chlorhexidine Gluconate Cloth  6 each Topical Daily   darbepoetin (ARANESP) injection - DIALYSIS  60 mcg Subcutaneous Weekly   diphenhydrAMINE  25 mg Intravenous Q6H   docusate  100 mg Per Tube BID   feeding supplement (PROSource TF)  45 mL Per Tube BID BM   heparin injection (subcutaneous)  5,000 Units Subcutaneous Q8H   insulin aspart  1-3 Units Subcutaneous Q4H   insulin detemir  5 Units Subcutaneous Q12H   mouth rinse  15 mL Mouth Rinse 10 times per day   methylPREDNISolone (SOLU-MEDROL) injection  60 mg Intravenous Q12H   mupirocin ointment  1 application Nasal BID   polyethylene glycol  17 g Per Tube Daily   sodium chloride flush  10-40 mL Intracatheter Q12H   sodium chloride flush  5 mL Intracatheter Q8H   valproic acid  250 mg Per Tube TID    Dialysis Orders: TTS [place] [facility])  4h 87mn   70.5kg  400/500   2/2.25 bath  TDC  Hep 1000  - calc 0.225m PO q HD  - mircera 50 q4 last 5/31  - venofer 50 /wk  - sensipar 1207mO q HD  Assessment/Plan: S/p cardiac  arrest, prolonged time until ROSC 06/22/20.  Likely anaphylaxis to IV contrast Like severe anoxic brain injury, neuro following Acute cholecysttis, on Cefepime/Flagyl, CCS following; s/p perc drain 6/21; cefepime flagyl ESRD: R IJ TDC. UNCBartlettCont on CRRT for now, start with some UF. Cont all 4K.  Not on heparin or citrate at this time.  P ok.  Hypotension post #1 now off pressors Anemia: Hgb stable 8s, transfuse as needed.   CKD-BMD Ca ok,  PCM Mild/mod hyponatremia: improving with CRRT.    RyaRexene AgentD  [facility] 06/24/2020,8:31 AM  LOS: 4 days

## 2020-06-24 NOTE — Consult Note (Addendum)
Neurology Consultation Reason for Consult: Neuro prognostication postcardiac arrest Referring Physician: Dr. Kipp Brood  CC: Post cardiac arrest  History is obtained from chart review as patient is intubated/sedated  HPI: Daniel Thomas is a 65 y.o. male with past medical history of hypertension, diabetes, hyperlipidemia, end-stage renal disease on dialysis who initially presented to [facility] on 6/18 for nausea and vomiting.  Work-up included right upper quadrant ultrasound which showed cholelithiasis without gallbladder wall thickening.  Surgery was consulted and recommended percutaneous Chole versus percutaneous tube placement for possible acute cholecystitis.  However, patient and family declined.  On 06/22/2020 patient was undergoing CT abdomen with contrast to further evaluate gallbladder.  While in the CT, patient had a cardiac arrest, 30 minutes before ROSC.  He was then noted to have myoclonic seizures for which she was started on Keppra, Depakote and propofol.  Neurology was consulted for neuro prognostication.   ROS:  Unable to obtain due to altered mental status.   Past Medical History:  Diagnosis Date   COPD (chronic obstructive pulmonary disease) ([place])    Diabetes mellitus without complication ([place])    Enlarged prostate    ESRD on dialysis ([place])    Heart murmur    High cholesterol    Hypertension     History reviewed.  Unable to obtain family history as patient is intubated/sedated  Social History: Per chart review reports that he has quit smoking. He has never used smokeless tobacco. He reports that he does not drink alcohol and does not use drugs.   Medications Prior to Admission  Medication Sig Dispense Refill Last Dose   albuterol (VENTOLIN HFA) 108 (90 Base) MCG/ACT inhaler Inhale 2 puffs into the lungs every 6 (six) hours as needed for wheezing or shortness of breath.   06/19/2020   amLODipine (NORVASC) 10 MG tablet Take 1 tablet (10 mg total) by mouth  at bedtime. 30 tablet 0 06/11/2020   atorvastatin (LIPITOR) 40 MG tablet Take 40 mg by mouth at bedtime.   06/18/2020   calcium carbonate (TUMS - DOSED IN MG ELEMENTAL CALCIUM) 500 MG chewable tablet Chew 1 tablet by mouth every 6 (six) hours as needed for indigestion.   Past Week   carvedilol (COREG) 12.5 MG tablet Take 12.5 mg by mouth 2 (two) times daily with a meal.   06/19/2020 at 7a   citalopram (CELEXA) 20 MG tablet Take 20 mg by mouth daily.   06/19/2020   ergocalciferol (VITAMIN D2) 1.25 MG (50000 UT) capsule Take by mouth. Administered at analysis   Past Week   ferric citrate (AURYXIA) 1 GM 210 MG(Fe) tablet Take by mouth.    Past Week   fluticasone (FLONASE) 50 MCG/ACT nasal spray Place 1 spray into both nostrils daily.   Past Week   hydrALAZINE (APRESOLINE) 25 MG tablet Take 1 tablet (25 mg total) by mouth every 8 (eight) hours. 90 tablet 0 06/19/2020   linagliptin (TRADJENTA) 5 MG TABS tablet Take 5 mg by mouth daily.   06/19/2020   sevelamer carbonate (RENVELA) 800 MG tablet Take 1,600 mg by mouth 3 (three) times daily with meals.   06/19/2020   traZODone (DESYREL) 50 MG tablet Take 25 mg by mouth at bedtime.   06/19/2020   lidocaine (LIDODERM) 5 % Place 1 patch onto the skin every 12 (twelve) hours. Remove & Discard patch within 12 hours or as directed by MD (Patient not taking: No sig reported) 10 patch 0 Not Taking   losartan (COZAAR) 50  MG tablet Take 1 tablet (50 mg total) by mouth at bedtime. 30 tablet 0       Exam: Current vital signs: BP 111/61 (BP Location: Left Arm)   Pulse 76   Temp (!) 97.34 F (36.3 C)   Resp 16   Ht 5\' 1"  (1.549 m)   Wt 82.8 kg   SpO2 100%   BMI 34.49 kg/m  Vital signs in last 24 hours: Temp:  [91.4 F (33 C)-99.68 F (37.6 C)] 97.34 F (36.3 C) (06/22 0900) Pulse Rate:  [56-88] 76 (06/22 0900) Resp:  [12-29] 16 (06/22 0900) BP: (92-153)/(34-75) 111/61 (06/22 0800) SpO2:  [98 %-100 %] 100 % (06/22 1028) Arterial Line BP: (99-157)/(47-74)  120/50 (06/22 0900) FiO2 (%):  [40 %-100 %] 40 % (06/22 1028) Weight:  [82.8 kg-84.2 kg] 82.8 kg (06/22 0500)   Physical Exam  Constitutional: Appears well-developed and well-nourished.  Psych:  unable to assess due to intubation/sedation Eyes: No scleral injection HENT: No OP obstrucion Head: Normocephalic.  Cardiovascular: Normal rate and regular rhythm.  Respiratory: Intubated, coarse breath sounds bilaterally GI: Soft. Cholecystostomy tube in place. Skin: Warm Neuro: On propofol at 26mcg/hr, comatose, does not open eyes to noxious stimuli, PERRLA, subtle corneal reflex on right, absent on left, cough reflex present, does not withdraw to noxious stimuli in all 4 extremities  I have reviewed labs in epic and the results pertinent to this consultation are: CBC:  Recent Labs  Lab 06/13/2020 0720 06/21/20 0233 06/22/20 2151 06/22/20 2233 06/23/20 0517 06/24/20 0316  WBC 10.1   < > 29.3*  --  30.4* 22.6*  NEUTROABS 8.7*  --  22.4*  --   --   --   HGB 10.4*   < > 8.5*   < > 8.4* 8.3*  HCT 32.1*   < > 26.5*   < > 25.7* 24.1*  MCV 94.4   < > 97.8  --  94.5 90.3  PLT 335   < > 194  --  272 289   < > = values in this interval not displayed.    Basic Metabolic Panel:  Lab Results  Component Value Date   NA 132 (L) 06/24/2020   K 4.7 06/24/2020   CO2 23 06/24/2020   GLUCOSE 109 (H) 06/24/2020   BUN 40 (H) 06/24/2020   CREATININE 5.29 (H) 06/24/2020   CALCIUM 7.9 (L) 06/24/2020   GFRNONAA 11 (L) 06/24/2020   GFRAA 10 (L) 06/29/2019   Lipid Panel:  Lab Results  Component Value Date   LDLCALC 139 (H) 01/07/2018   HgbA1c:  Lab Results  Component Value Date   HGBA1C 6.2 (H) 12/06/2019   Urine Drug Screen: No results found for: LABOPIA, COCAINSCRNUR, LABBENZ, AMPHETMU, THCU, LABBARB  Alcohol Level No results found for: ETH   I have reviewed the images obtained: CT head without contrast 06/24/2020:  1. Loss of gray-white differentiation subtle in most areas but more  pronounced particularly in the basal ganglia on the RIGHT compatible with early changes related to hypoxic/ischemic injury. No herniation or significant changes of edema at this time. 2. Decreased size of mixed density extra-axial collection along the RIGHT posterior frontal parietal region with decreased mass effect. 3. Tiny extra-axial collection, small subdural may be present along the LEFT convexity as well better seen on coronal images decreased in conspicuity compared to the previous exam. 4. Potential diminished gray-white differentiation. 5. No midline shift. 6. Scattered ethmoid opacification and signs of sphenoid sinus disease.   ASSESSMENT/PLAN:  65 year old male with multiple medical comorbidities as noted in HPI, had in-hospital cardiac arrest on 06/22/2020, 30 minutes for ROSC.  Was noted to have myoclonic seizures.   Anoxic/hypoxic brain injury Myoclonic seizures Cardiac arrest Left subdural hematoma, chronic Right extra-axial hematoma, chronic -Myoclonic seizures improved, EEG not showing but suppression pattern -CT head as described above showing early changes related to hypoxic/ischemic injury but no edema or herniation.  Recommendations: -Continue Keppra 1000 mg twice daily, Depakote to 50 mg 3 times daily.  We will check Depakote levels so that we can adjust if patient has any further seizures -Continue to titrate propofol for seizure suppression -Patient had prolonged downtime, myoclonic seizures in the first 24 hours and CT head is already showing early signs of hypoxic/anoxic injury.  I called  and discussed with patient's brothers Joe and [name] individually.  Discussed with both that I am concerned patient has suffered significant anoxic/hypoxic brain injury.  Per brothers, patient was living independently, was able to take care of basic ADLs and go to his dialysis appointment prior to this admission.  I discussed that I am concerned that patient will most likely not  recover to the point where he is able to live independently. I was planning to discuss more about neurologic injury and outcomes.  However, patient's brother [name] shared how doctors said their mother would not survive after the stroke that she had but she was able to live for another year.  I asked him how that 1 year went and he described she had multiple admissions to the hospital and eventually passed away due to cardiac arrest while she was DNR. [name] expressed his concerns and dissatisfaction with the hospital system in general and how he plans on getting a lawyer to subpoena the video footage of the care his brother has been receiving.  I discussed with him that I am here to discuss and help him with Mr. Brick Klopf's neurologic injury and prognosis. I asked him if I can meet him and the family members when they are in the hospital.  He said he will be there at 8 AM.  I told him to please let the nurse know when he arrives so I can come. He said he has told me what time he will be there and if I do not come at that time, it means I do not care about [place].  He then hung up the phone. -Updated critical care team about my conversation with family.  Continue goals of conversation with family.  Will likely benefit from palliative care consult -We will plan for MRI brain after 72 hours  -Continue seizure precautions -Management of rest of comorbidities per primary team  Thank you for allowing [place] to participate in the care of this patient. If you have any further questions, please contact  me or neurohospitalist.    I have spent a total of 150  minutes with the patient reviewing hospital notes,  test results, labs and examining the patient as well as establishing an assessment and plan that was discussed personally with the patient's brothers, ICU team.  > 50% of time was spent in direct patient care.       Zeb Comfort Epilepsy [facility]

## 2020-06-24 NOTE — Progress Notes (Signed)
Notified Dr. Zenda Alpers (Nephrology) of K 3.4 and Ca 7.9; he stated to leave CRRT with the K 4.0 and Ca 2.5, no changes need to be made at this time.  Consuella Lose RN

## 2020-06-24 NOTE — Progress Notes (Signed)
Discontinued cEEG study.  Notified [facility] monitoring.  Removed EEG electrodes.  No skin breakdown observed.  ?

## 2020-06-24 NOTE — Progress Notes (Signed)
[facility] Progress Note Patient Name: SAYEED WEATHERALL DOB: [birth year] MRN: [id number]   Date of Service  06/24/2020  HPI/Events of Note  Nursing request for AM CXR.  eICU Interventions  Will order portable CXR now.      Intervention Category Major Interventions: Respiratory failure - evaluation and management  Shiraz Bastyr Eugene 06/24/2020, 5:15 AM

## 2020-06-24 NOTE — Progress Notes (Signed)
[facility] Progress Note Patient Name: [name] DOB: 10-17-1955 MRN: [id number]   Date of Service  06/24/2020  HPI/Events of Note  Hypertension - BP = 174/67.    Plan: Hydralazine 10 mg IV Q 4 hours PRN SBP > 160 or DBP > 100.      Intervention Category Major Interventions: Hypertension - evaluation and management  Curtisha Bendix Eugene 06/24/2020, 8:20 PM

## 2020-06-24 NOTE — Progress Notes (Signed)
Progress Note     Subjective: CC: still sedated.  No new events.  Got perc chole drain yesterday   Objective: Vital signs in last 24 hours: Temp:  [91.4 F (33 C)-99.68 F (37.6 C)] 97.34 F (36.3 C) (06/22 0800) Pulse Rate:  [56-88] 76 (06/22 0800) Resp:  [12-29] 20 (06/22 0800) BP: (92-153)/(34-75) 111/61 (06/22 0800) SpO2:  [98 %-100 %] 100 % (06/22 0800) Arterial Line BP: (99-166)/(47-74) 114/49 (06/22 0800) FiO2 (%):  [40 %-100 %] 40 % (06/22 0800) Weight:  [82.8 kg-84.2 kg] 82.8 kg (06/22 0500) Last BM Date: 06/24/20  Intake/Output from previous day: 06/21 0701 - 06/22 0700 In: 2394.2 [I.V.:1149.3; NG/GT:255; IV Piggyback:864.9] Out: 2140 [Emesis/NG output:600; Drains:135] Intake/Output this shift: Total I/O In: 19.3 [I.V.:19.3] Out: 123 [Emesis/NG output:50; Other:73]  PE: General: critically ill, on vent Abd: soft, unable to assess tenderness due to unresponsive nature.  Perc chole drain in place with brown bilious output    Lab Results:  Recent Labs    06/23/20 0517 06/24/20 0316  WBC 30.4* 22.6*  HGB 8.4* 8.3*  HCT 25.7* 24.1*  PLT 272 289   BMET Recent Labs    06/23/20 1605 06/24/20 0316  NA 129* 132*  K 3.7 4.7  CL 93* 99  CO2 20* 23  GLUCOSE 144* 109*  BUN 56* 40*  CREATININE 8.07* 5.29*  CALCIUM 8.0* 7.9*   PT/INR No results for input(s): LABPROT, INR in the last 72 hours. CMP     Component Value Date/Time   NA 132 (L) 06/24/2020 0316   K 4.7 06/24/2020 0316   CL 99 06/24/2020 0316   CO2 23 06/24/2020 0316   GLUCOSE 109 (H) 06/24/2020 0316   BUN 40 (H) 06/24/2020 0316   CREATININE 5.29 (H) 06/24/2020 0316   CALCIUM 7.9 (L) 06/24/2020 0316   PROT 5.7 (L) 06/24/2020 0316   ALBUMIN 2.4 (L) 06/24/2020 0316   AST 207 (H) 06/24/2020 0316   ALT 240 (H) 06/24/2020 0316   ALKPHOS 139 (H) 06/24/2020 0316   BILITOT 0.8 06/24/2020 0316   GFRNONAA 11 (L) 06/24/2020 0316   GFRAA 10 (L) 06/29/2019 0340   Lipase     Component  Value Date/Time   LIPASE 40 06/22/2020 0103       Studies/Results: CT ABDOMEN PELVIS W CONTRAST  Result Date: 06/22/2020 CLINICAL DATA:  65 year old male with abdominal pain. EXAM: CT ABDOMEN AND PELVIS WITH CONTRAST TECHNIQUE: Multidetector CT imaging of the abdomen and pelvis was performed using the standard protocol following bolus administration of intravenous contrast. CONTRAST:  123mL OMNIPAQUE IOHEXOL 300 MG/ML  SOLN COMPARISON:  Abdominal ultrasound dated 06/26/2020. CT abdomen pelvis dated 11/10/2019. FINDINGS: Evaluation of this exam is limited due to respiratory motion artifact. Lower chest: Bibasilar linear atelectasis. There is coronary vascular calcification and calcification of the mitral annulus. Partially visualized central venous line with tip at the cavoatrial junction. No intra-abdominal free air or free fluid. Hepatobiliary: The liver is unremarkable. No intrahepatic biliary dilatation. There is a stone the gallbladder. The gallbladder is distended. There is gallbladder wall edema with pericholecystic fluid and stranding. Findings most consistent with acute cholecystitis. Further evaluation with ultrasound recommended. Pancreas: Unremarkable. No pancreatic ductal dilatation or surrounding inflammatory changes. Spleen: There is an area of hypoenhancement involving the midportion of the spleen most concerning for splenic infarct. Adrenals/Urinary Tract: The adrenal glands unremarkable. Vascular calcification versus small nonobstructing bilateral renal calculi measure up to 3 mm in the lower pole of the right kidney. There is  no hydronephrosis on either side. There is slight heterogeneous enhancement the renal parenchyma. Correlation with urinalysis recommended to exclude UTI. The visualized ureters and urinary bladder appear unremarkable. Stomach/Bowel: There is no bowel obstruction or active inflammation. The appendix is normal. Vascular/Lymphatic: Mild aortoiliac atherosclerotic  disease. The IVC is unremarkable. No portal venous gas. There is no adenopathy. Reproductive: Enlarged prostate gland measuring 5 cm in transverse axial diameter. The seminal vesicles are symmetric. Other: Small fat containing bilateral inguinal hernia. There is slight protrusion of sigmoid colon into the left inguinal canal. Musculoskeletal: Osteopenia with degenerative changes of the spine. Old L1 compression fracture. There is compression fracture of inferior endplate of J67, new since the prior CT. Correlation with clinical exam and point tenderness recommended. IMPRESSION: 1. Cholelithiasis with findings most consistent with acute cholecystitis. Further evaluation with ultrasound recommended. 2. 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 3. No bowel obstruction. Normal appendix. 4. Aortic Atherosclerosis (ICD10-I70.0). Electronically Signed   By: Anner Crete M.D.   On: 06/22/2020 21:28   IR Perc Cholecystostomy  Result Date: 06/23/2020 INDICATION: Acute cholecystitis. Please perform image guided cholecystostomy tube placement for infection source control purposes. EXAM: ULTRASOUND AND FLUOROSCOPIC-GUIDED CHOLECYSTOSTOMY TUBE PLACEMENT COMPARISON:  CT abdomen and pelvis-06/22/2020; right upper quadrant abdominal ultrasound-06/23/2020 MEDICATIONS: The patient is currently admitted to the hospital and on intravenous antibiotics. Antibiotics were administered within an appropriate time frame prior to skin puncture. ANESTHESIA/SEDATION: Patient is currently intubated and sedated in the ICU. CONTRAST:  None, no contrast was administered secondary to patient's anaphylactic reaction to intravenous contrast during abdominal CT performed 06/22/2020. FLUOROSCOPY TIME:  18 seconds (3 mGy) COMPLICATIONS: None immediate. PROCEDURE: Informed written consent was obtained from the patient's family after a discussion of the risks, benefits and alternatives to treatment. Questions regarding the procedure were encouraged and answered. A  timeout was performed prior to the initiation of the procedure. The right upper abdominal quadrant was prepped and draped in the usual sterile fashion, and a sterile drape was applied covering the operative field. Maximum barrier sterile technique with sterile gowns and gloves were used for the procedure. A timeout was performed prior to the initiation of the procedure. Local anesthesia was provided with 1% lidocaine with epinephrine. Ultrasound scanning of the right upper quadrant demonstrates a markedly dilated gallbladder with gallbladder wall thickening and minimal amount of pericholecystic fluid. Utilizing a transhepatic approach, a 22 gauge needle was advanced into the gallbladder under direct ultrasound guidance. An ultrasound image was saved for documentation purposes. Appropriate intraluminal puncture was confirmed with the efflux of bile and advancement of an 0.018 wire into the gallbladder lumen. The needle was exchanged for an [place] set. A small amount of contrast was injected to confirm appropriate intraluminal positioning. Over a [place] wire, a 34.2-French Cook cholecystomy tube was advanced into the gallbladder fossa, coiled and locked. Bile was aspirated and several post procedural spot radiographic images were obtained in various obliquities. Again, no contrast was administered secondary to patient's anaphylaxis to intravenous contrast received during abdominal CT performed 06/22/2020. The catheter was secured to the skin with suture, connected to a drainage bag and a dressing was placed. The patient tolerated the procedure well without immediate post procedural complication. IMPRESSION: Successful ultrasound and fluoroscopic guided placement of a 10.2 French cholecystostomy tube. Electronically Signed   By: Sandi Mariscal M.D.   On: 06/23/2020 14:26   DG CHEST PORT 1 VIEW  Result Date: 06/24/2020 CLINICAL DATA:  Respiratory failure.  Intubation. EXAM: PORTABLE CHEST 1 VIEW COMPARISON:   06/22/2020. FINDINGS:  Endotracheal tube tip 1.5 cm above the carina. NG tube tip below left hemidiaphragm. Dialysis catheter in stable position. Cardiomegaly. No pulmonary venous congestion. Low lung volumes with prominent bibasilar subsegmental atelectasis noted on today's exam. No pleural effusion or pneumothorax. IMPRESSION: Endotracheal tube tip 1.5 cm above the carina. NG tube tip below left hemidiaphragm. Dialysis catheter stable position. 2.  Cardiomegaly.  No pulmonary venous congestion. 3. Low lung volumes with prominent bibasilar subsegmental atelectasis noted on today's exam. Electronically Signed   By: Marcello Moores  Register   On: 06/24/2020 07:13   DG CHEST PORT 1 VIEW  Result Date: 06/22/2020 CLINICAL DATA:  Emergent intubation with NG tube placement. EXAM: PORTABLE CHEST 1 VIEW COMPARISON:  Chest radiograph 12/05/2019 FINDINGS: Endotracheal tube tip is 3.7 cm from the carina. Tip and side port of the enteric tube below the diaphragm in the stomach there is a right internal jugular dialysis catheter with tip at the atrial caval junction. Previous left-sided dialysis catheter is no longer seen. Stable heart size and mediastinal contours vascular congestion. No focal airspace disease. No pneumothorax. No acute osseous abnormalities are seen. IMPRESSION: 1. Endotracheal tube tip 3.7 cm from the carina. Enteric tube in place. 2. Right internal jugular dialysis catheter with tip at the atrial caval junction. 3. Vascular congestion. Electronically Signed   By: Keith Rake M.D.   On: 06/22/2020 22:42   DG Abd Portable 1V  Result Date: 06/22/2020 CLINICAL DATA:  NG tube placement. EXAM: PORTABLE ABDOMEN - 1 VIEW COMPARISON:  Abdominopelvic CT earlier today FINDINGS: Tip and side port of the enteric tube below the diaphragm in the stomach enteric contrast is seen throughout the colon from prior CT. No free air or obstruction. Known gallstone is not well visualized IMPRESSION: Tip and side port of the  enteric tube below the diaphragm in the stomach. Electronically Signed   By: Keith Rake M.D.   On: 06/22/2020 22:42   EEG adult  Result Date: 06/23/2020 Greta Doom, MD     06/23/2020  1:54 AM History: 65 yo M s/p cardiac arrest. Sedation: Propofol 5 mcg/kg/min Technique: This is a 21 channel routine scalp EEG performed at the bedside with bipolar and monopolar montages arranged in accordance to the international 10/20 system of electrode placement. One channel was dedicated to EKG recording. Background: The background is diffusely attenuated with occasional epileptiform bursts lasting 1-2 seconds associated with generalized myoclonus.  Photic stimulation: Physiologic driving is not performed EEG Abnormalities: 1) Burst suppression EEG with myoclonus assocaited with bursts Clinical Interpretation: This EEG demonstrates evidence of profound generalized cerebral dysfunction as can be seen with hypoxic brain injury. In the post-cardiac arrest setting, this EEG is highly correlated with poor prognosis. Levetiracetam and valproic acid can both sometimes be useful in controlling the symptomatic movements, as can propofol. Roland Rack, MD [facility] [phone] If 7pm- 7am, please page neurology on call as listed in [place].    Anti-infectives: Anti-infectives (From admission, onward)    Start     Dose/Rate Route Frequency Ordered Stop   06/23/20 1800  ceFEPIme (MAXIPIME) 2 g in sodium chloride 0.9 % 100 mL IVPB        2 g 200 mL/hr over 30 Minutes Intravenous Every 12 hours 06/23/20 0955     06/14/2020 1030  metroNIDAZOLE (FLAGYL) IVPB 500 mg        500 mg 100 mL/hr over 60 Minutes Intravenous Every 8 hours 06/15/2020 1000     06/12/2020 1030  ceFEPIme (MAXIPIME) 1 g  in sodium chloride 0.9 % 100 mL IVPB  Status:  Discontinued        1 g 200 mL/hr over 30 Minutes Intravenous Every 24 hours 06/03/2020 1014 06/23/20 0955        Assessment/Plan  Symptomatic Cholelithiasis  with possible acute cholecystitis Elevated Lipase without meeting laboratory criteria for Pancreatitis - WBC down to 22K today from 30K -appreciate IR assistance in drain placement yesterday -would treat for 7 days total of IV abx therapy for gallbladder.  -Gallbladder now adequately drained -brother at bedside and updated from surgery standpoint -may start TFs from our standpoint   FEN - NPO for now, IVF per primary, may start TFs from our standpoint VTE - SCDs, lovenox ID - Cefepime/Flagyl >>    COPD DM2 ESRD on HD T/T/S HLD HTN S/p PEA arrest now with suspect anoxic brain injury.  Poor prognosis.    LOS: 4 days    Henreitta Cea, [facility] Surgery 06/24/2020, 8:31 AM Please see Amion for pager number during day hours 7:00am-4:30pm

## 2020-06-24 NOTE — Procedures (Addendum)
Patient Name: Daniel Thomas  MRN: [id number]  Epilepsy Attending: Lora Havens  Referring Physician/Provider: Dr. Kathrynn Speed Duration: 06/24/2020 0203 to 06/24/2020 7673   Patient history: 65 year old male status post cardiac arrest.  EEG done for seizures.   Level of alertness: Comatose.   AEDs during EEG study: Keppra, Depakote, propofol   Technical aspects: This EEG study was done with scalp electrodes positioned according to the 10-20 International system of electrode placement. Electrical activity was acquired at a sampling rate of 500Hz  and reviewed with a high frequency filter of 70Hz  and a low frequency filter of 1Hz . EEG data were recorded continuously and digitally stored.   Description: EEG showed burst suppression pattern with bursts of sharply contoured 2 to 5 Hz theta-delta slowing lasting 1 second followed by 5 to 6 seconds of generalized EEG suppression.  EEG was not reactive to tactile stimulation.  Hyperventilation and photic stimulation were not performed.      ABNORMALITY -Burst suppression, generalized   IMPRESSION: This study is suggestive of profound diffuse encephalopathy. No definite seizures were seen during the study. 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     [name] Barbra Sarks

## 2020-06-24 NOTE — Progress Notes (Signed)
Initial Nutrition Assessment  DOCUMENTATION CODES:   Not applicable  INTERVENTION:   Tube Feeding via OG: Vital 1.5 at 50 ml/hr Begin TF at 20 ml/hr; titrate by 10 mL q 8 hours until goal rate of 50 ml/hr Pro-Source TF 84mL TID Provides 2040 kcals, 147 g of protein and 912 mL of free water  TF regimen and propofol at current rate providing 2502 total kcal/day  Add B complex with C   NUTRITION DIAGNOSIS:   Inadequate oral intake related to acute illness as evidenced by NPO status.  GOAL:   Patient will meet greater than or equal to 90% of their needs  MONITOR:   Vent status, Labs, Weight trends, Skin, TF tolerance  REASON FOR ASSESSMENT:   Consult, Ventilator Enteral/tube feeding initiation and management  ASSESSMENT:   65 yo male admitted with N/V with symptomatic cholelithiasis and possible cholecystitis, cardiac arrest requiring intubation. PMH includes COPD, ESRD/HD, DM, heart murmor, HTN, HLD  6/18 Admitted 6/20 Cardiac arrest, Intubated 6/21 CRRT initiated, IR placed perc cholecystostomy tube  Pt remains sedated on vent support, on CRRT Propofol: 17.5  Ok to initiate TF per Surgery. Discussed with CCM and agreeable with starting TF today as well  Abdomen obese but soft; BS faint, +smear BM this AM. OG with 400 mL bilious output this shift so far, 600 mL overnight. MD is aware.  EDW 70.5 kg; current wt 82.8 kg; noted weight of 75.1 kg on admission  Unable to obtain diet and weight history.   Labs: sodium 132 (L), Creatinine 5.29 Meds: ss novolog, levemir, solumedrol, miralax, colace   Diet Order:   Diet Order             Diet NPO time specified Except for: Sips with Meds  Diet effective now                   EDUCATION NEEDS:   Not appropriate for education at this time  Skin:  Skin Assessment: Reviewed RN Assessment  Last BM:  6/22 smear  Height:   Ht Readings from Last 1 Encounters:  06/22/20 5\' 1"  (1.549 m)    Weight:   Wt  Readings from Last 1 Encounters:  06/24/20 82.8 kg     BMI:  Body mass index is 34.49 kg/m.  Estimated Nutritional Needs:   Kcal:  2952-8413 kcals  Protein:  125-150 g  Fluid:  1000 mL plus UOP   Kerman Passey MS, RDN, LDN, CNSC Registered Dietitian III Clinical Nutrition RD Pager and On-Call Pager Number Located in [place]

## 2020-06-25 LAB — COMPREHENSIVE METABOLIC PANEL
ALT: 179 U/L — ABNORMAL HIGH (ref 0–44)
AST: 89 U/L — ABNORMAL HIGH (ref 15–41)
Albumin: 2.6 g/dL — ABNORMAL LOW (ref 3.5–5.0)
Alkaline Phosphatase: 163 U/L — ABNORMAL HIGH (ref 38–126)
Anion gap: 9 (ref 5–15)
BUN: 34 mg/dL — ABNORMAL HIGH (ref 8–23)
CO2: 23 mmol/L (ref 22–32)
Calcium: 8.2 mg/dL — ABNORMAL LOW (ref 8.9–10.3)
Chloride: 102 mmol/L (ref 98–111)
Creatinine, Ser: 3.2 mg/dL — ABNORMAL HIGH (ref 0.61–1.24)
GFR, Estimated: 21 mL/min — ABNORMAL LOW (ref >60)
Glucose, Bld: 166 mg/dL — ABNORMAL HIGH (ref 70–99)
Potassium: 5.2 mmol/L — ABNORMAL HIGH (ref 3.5–5.1)
Sodium: 134 mmol/L — ABNORMAL LOW (ref 135–145)
Total Bilirubin: 0.5 mg/dL (ref 0.3–1.2)
Total Protein: 6.2 g/dL — ABNORMAL LOW (ref 6.5–8.1)

## 2020-06-25 LAB — CBC
HCT: 26.6 % — ABNORMAL LOW (ref 39.0–52.0)
Hemoglobin: 8.7 g/dL — ABNORMAL LOW (ref 13.0–17.0)
MCH: 31 pg (ref 26.0–34.0)
MCHC: 32.7 g/dL (ref 30.0–36.0)
MCV: 94.7 fL (ref 80.0–100.0)
Platelets: 311 10*3/uL (ref 150–400)
RBC: 2.81 MIL/uL — ABNORMAL LOW (ref 4.22–5.81)
RDW: 15.4 % (ref 11.5–15.5)
WBC: 25.8 10*3/uL — ABNORMAL HIGH (ref 4.0–10.5)
nRBC: 0.1 % (ref 0.0–0.2)

## 2020-06-25 LAB — GLUCOSE, CAPILLARY
Glucose-Capillary: 125 mg/dL — ABNORMAL HIGH (ref 70–99)
Glucose-Capillary: 149 mg/dL — ABNORMAL HIGH (ref 70–99)
Glucose-Capillary: 166 mg/dL — ABNORMAL HIGH (ref 70–99)
Glucose-Capillary: 192 mg/dL — ABNORMAL HIGH (ref 70–99)
Glucose-Capillary: 210 mg/dL — ABNORMAL HIGH (ref 70–99)
Glucose-Capillary: 211 mg/dL — ABNORMAL HIGH (ref 70–99)
Glucose-Capillary: 218 mg/dL — ABNORMAL HIGH (ref 70–99)

## 2020-06-25 LAB — RENAL FUNCTION PANEL
Albumin: 2.6 g/dL — ABNORMAL LOW (ref 3.5–5.0)
Anion gap: 3 — ABNORMAL LOW (ref 5–15)
BUN: 37 mg/dL — ABNORMAL HIGH (ref 8–23)
CO2: 26 mmol/L (ref 22–32)
Calcium: 7.6 mg/dL — ABNORMAL LOW (ref 8.9–10.3)
Chloride: 104 mmol/L (ref 98–111)
Creatinine, Ser: 2.67 mg/dL — ABNORMAL HIGH (ref 0.61–1.24)
GFR, Estimated: 26 mL/min — ABNORMAL LOW (ref >60)
Glucose, Bld: 217 mg/dL — ABNORMAL HIGH (ref 70–99)
Phosphorus: 3.9 mg/dL (ref 2.5–4.6)
Potassium: 4.4 mmol/L (ref 3.5–5.1)
Sodium: 133 mmol/L — ABNORMAL LOW (ref 135–145)

## 2020-06-25 LAB — PHOSPHORUS
Phosphorus: 5 mg/dL — ABNORMAL HIGH (ref 2.5–4.6)
Phosphorus: 4 mg/dL (ref 2.5–4.6)

## 2020-06-25 LAB — VALPROIC ACID LEVEL: Valproic Acid Lvl: 17 ug/mL — ABNORMAL LOW (ref 50.0–100.0)

## 2020-06-25 LAB — MAGNESIUM
Magnesium: 2.7 mg/dL — ABNORMAL HIGH (ref 1.7–2.4)
Magnesium: 2.7 mg/dL — ABNORMAL HIGH (ref 1.7–2.4)

## 2020-06-25 MED ORDER — CARVEDILOL 12.5 MG PO TABS
12.5000 mg | ORAL_TABLET | Freq: Two times a day (BID) | ORAL | Status: DC
  Administered 2020-06-25 – 2020-06-29 (×10): 12.5 mg
  Filled 2020-06-25 (×10): qty 1

## 2020-06-25 MED ORDER — HYDRALAZINE HCL 25 MG PO TABS
25.0000 mg | ORAL_TABLET | Freq: Three times a day (TID) | ORAL | Status: DC
  Administered 2020-06-25 – 2020-06-26 (×3): 25 mg
  Filled 2020-06-25 (×3): qty 1

## 2020-06-25 MED ORDER — PRISMASOL BGK 0/2.5 32-2.5 MEQ/L EC SOLN
Status: DC
  Filled 2020-06-25 (×30): qty 5000

## 2020-06-25 NOTE — Progress Notes (Addendum)
This RN was present for meeting with patient's brother, [name], and Dr Hortense Ramal.

## 2020-06-25 NOTE — Progress Notes (Signed)
Progress Note     Subjective: CC:  No new events, still unresponsive   Objective: Vital signs in last 24 hours: Temp:  [97.34 F (36.3 C)-99.32 F (37.4 C)] 98.06 F (36.7 C) (06/23 0900) Pulse Rate:  [73-93] 92 (06/23 0900) Resp:  [18-33] 22 (06/23 0900) BP: (119-166)/(65-74) 154/68 (06/23 0800) SpO2:  [98 %-100 %] 98 % (06/23 0900) Arterial Line BP: (122-174)/(54-67) 174/65 (06/23 0900) FiO2 (%):  [30 %-40 %] 30 % (06/23 0815) Weight:  [80.4 kg] 80.4 kg (06/23 0200) Last BM Date: 06/24/20  Intake/Output from previous day: 06/22 0701 - 06/23 0700 In: 2572.8 [I.V.:405.2; NG/GT:818.7; IV Piggyback:1333.9] Out: 4305 [Emesis/NG output:500; Drains:30] Intake/Output this shift: Total I/O In: 40 [NG/GT:40] Out: 27 [Other:46]  PE: General: critically ill, on vent Abd: soft, unable to assess tenderness due to unresponsive nature.  Perc chole drain in place with brown bilious output.  No changes    Lab Results:  Recent Labs    06/24/20 0316 06/25/20 0352  WBC 22.6* 25.8*  HGB 8.3* 8.7*  HCT 24.1* 26.6*  PLT 289 311   BMET Recent Labs    06/24/20 1520 06/25/20 0352  NA 137 134*  K 3.4* 5.2*  CL 114* 102  CO2 17* 23  GLUCOSE 112* 166*  BUN 29* 34*  CREATININE 3.06* 3.20*  CALCIUM 6.0* 8.2*   PT/INR No results for input(s): LABPROT, INR in the last 72 hours. CMP     Component Value Date/Time   NA 134 (L) 06/25/2020 0352   K 5.2 (H) 06/25/2020 0352   CL 102 06/25/2020 0352   CO2 23 06/25/2020 0352   GLUCOSE 166 (H) 06/25/2020 0352   BUN 34 (H) 06/25/2020 0352   CREATININE 3.20 (H) 06/25/2020 0352   CALCIUM 8.2 (L) 06/25/2020 0352   PROT 6.2 (L) 06/25/2020 0352   ALBUMIN 2.6 (L) 06/25/2020 0352   AST 89 (H) 06/25/2020 0352   ALT 179 (H) 06/25/2020 0352   ALKPHOS 163 (H) 06/25/2020 0352   BILITOT 0.5 06/25/2020 0352   GFRNONAA 21 (L) 06/25/2020 0352   GFRAA 10 (L) 06/29/2019 0340   Lipase     Component Value Date/Time   LIPASE 40 06/22/2020  0103       Studies/Results: CT HEAD WO CONTRAST  Result Date: 06/24/2020 CLINICAL DATA:  Status post cardiac arrest. EXAM: CT HEAD WITHOUT CONTRAST TECHNIQUE: Contiguous axial images were obtained from the base of the skull through the vertex without intravenous contrast. COMPARISON:  April 29, 2020. FINDINGS: Brain: Decreased size of lenticular extra-axial collection along the high RIGHT posterior frontal and parietal convexity with mixed attenuation material layering dependently. No midline shift. No intraparenchymal hemorrhage. Tiny extra-axial collection, small subdural may be present along the LEFT convexity as well better seen on coronal images decreased in conspicuity compared to the previous exam no hydrocephalus. RIGHT sided extra-axial collection in coronal plane measuring 4.8 x 1.0 cm in greatest dimension as compared to 5.8 x 1.8 cm on the prior study. Less mass effect upon adjacent sulci (image 51/5) Very subtle area of extra-axial material on image 31 of series 5 May correspond to previous presumed extra-axial collection/small subdural. Diminished conspicuity of gray-white differentiation may be present. Vascular: No hyperdense vessel or unexpected calcification. Skull: Normal. Negative for fracture or focal lesion. Sinuses/Orbits: Scattered ethmoid opacification and signs of sphenoid sinus disease. Other: None IMPRESSION: 1. Loss of gray-white differentiation subtle in most areas but more pronounced particularly in the basal ganglia on the RIGHT compatible with  early changes related to hypoxic/ischemic injury. No herniation or significant changes of edema at this time. 2. Decreased size of mixed density extra-axial collection along the RIGHT posterior frontal parietal region with decreased mass effect. 3. Tiny extra-axial collection, small subdural may be present along the LEFT convexity as well better seen on coronal images decreased in conspicuity compared to the previous exam. 4. Potential  diminished gray-white differentiation. 5. No midline shift. 6. Scattered ethmoid opacification and signs of sphenoid sinus disease. 7. Critical Value/emergent results were called by telephone at the time of interpretation on 06/24/2020 at 11:31 am to provider [facility] , who verbally acknowledged these results. Electronically Signed   By: Zetta Bills M.D.   On: 06/24/2020 10:58   IR Perc Cholecystostomy  Result Date: 06/23/2020 INDICATION: Acute cholecystitis. Please perform image guided cholecystostomy tube placement for infection source control purposes. EXAM: ULTRASOUND AND FLUOROSCOPIC-GUIDED CHOLECYSTOSTOMY TUBE PLACEMENT COMPARISON:  CT abdomen and pelvis-06/22/2020; right upper quadrant abdominal ultrasound-06/23/2020 MEDICATIONS: The patient is currently admitted to the hospital and on intravenous antibiotics. Antibiotics were administered within an appropriate time frame prior to skin puncture. ANESTHESIA/SEDATION: Patient is currently intubated and sedated in the ICU. CONTRAST:  None, no contrast was administered secondary to patient's anaphylactic reaction to intravenous contrast during abdominal CT performed 06/22/2020. FLUOROSCOPY TIME:  18 seconds (3 mGy) COMPLICATIONS: None immediate. PROCEDURE: Informed written consent was obtained from the patient's family after a discussion of the risks, benefits and alternatives to treatment. Questions regarding the procedure were encouraged and answered. A timeout was performed prior to the initiation of the procedure. The right upper abdominal quadrant was prepped and draped in the usual sterile fashion, and a sterile drape was applied covering the operative field. Maximum barrier sterile technique with sterile gowns and gloves were used for the procedure. A timeout was performed prior to the initiation of the procedure. Local anesthesia was provided with 1% lidocaine with epinephrine. Ultrasound scanning of the right upper quadrant demonstrates a  markedly dilated gallbladder with gallbladder wall thickening and minimal amount of pericholecystic fluid. Utilizing a transhepatic approach, a 22 gauge needle was advanced into the gallbladder under direct ultrasound guidance. An ultrasound image was saved for documentation purposes. Appropriate intraluminal puncture was confirmed with the efflux of bile and advancement of an 0.018 wire into the gallbladder lumen. The needle was exchanged for an [place] set. A small amount of contrast was injected to confirm appropriate intraluminal positioning. Over a [place] wire, a 57.2-French Cook cholecystomy tube was advanced into the gallbladder fossa, coiled and locked. Bile was aspirated and several post procedural spot radiographic images were obtained in various obliquities. Again, no contrast was administered secondary to patient's anaphylaxis to intravenous contrast received during abdominal CT performed 06/22/2020. The catheter was secured to the skin with suture, connected to a drainage bag and a dressing was placed. The patient tolerated the procedure well without immediate post procedural complication. IMPRESSION: Successful ultrasound and fluoroscopic guided placement of a 10.2 French cholecystostomy tube. Electronically Signed   By: Sandi Mariscal M.D.   On: 06/23/2020 14:26   DG CHEST PORT 1 VIEW  Result Date: 06/24/2020 CLINICAL DATA:  Respiratory failure.  Intubation. EXAM: PORTABLE CHEST 1 VIEW COMPARISON:  06/22/2020. FINDINGS: Endotracheal tube tip 1.5 cm above the carina. NG tube tip below left hemidiaphragm. Dialysis catheter in stable position. Cardiomegaly. No pulmonary venous congestion. Low lung volumes with prominent bibasilar subsegmental atelectasis noted on today's exam. No pleural effusion or pneumothorax. IMPRESSION: Endotracheal tube tip 1.5 cm  above the carina. NG tube tip below left hemidiaphragm. Dialysis catheter stable position. 2.  Cardiomegaly.  No pulmonary venous congestion. 3. Low  lung volumes with prominent bibasilar subsegmental atelectasis noted on today's exam. Electronically Signed   By: Marcello Moores  Register   On: 06/24/2020 07:13   ECHOCARDIOGRAM COMPLETE  Result Date: 06/24/2020    ECHOCARDIOGRAM REPORT   Patient Name:   Daniel Thomas Date of Exam: 06/24/2020 Medical Rec #:  [id number]      Height:       61.0 in Accession #:    [id number]     Weight:       182.5 lb Date of Birth:  1955-03-31      BSA:          1.817 m Patient Age:    25 years       BP:           149/68 mmHg Patient Gender: M              HR:           80 bpm. Exam Location:  Inpatient Procedure: 2D Echo, Cardiac Doppler, Color Doppler, Strain Analysis and 3D Echo Indications:    Cardiac Arrest I46.9  History:        Patient has prior history of Echocardiogram examinations, most                 recent 12/18/2019. Signs/Symptoms:Shortness of Breath; Risk                 Factors:Hypertension.  Sonographer:    Merrie Roof RDCS Referring Phys: [id number] RAHUL P DESAI IMPRESSIONS  1. Left ventricular ejection fraction, by estimation, is 60 to 65%. The left ventricle has normal function. The left ventricle has no regional wall motion abnormalities. There is moderate concentric left ventricular hypertrophy. Left ventricular diastolic parameters are consistent with Grade I diastolic dysfunction (impaired relaxation). Elevated left ventricular end-diastolic pressure.  2. Right ventricular systolic function is normal. The right ventricular size is normal.  3. The mitral valve is normal in structure. Trivial mitral valve regurgitation. No evidence of mitral stenosis. Moderate mitral annular calcification.  4. The aortic valve is tricuspid. There is mild calcification of the aortic valve. There is mild thickening of the aortic valve. Aortic valve regurgitation is not visualized. No aortic stenosis is present.  5. IVC not dilated. Unable to assess for respiratory collapse as patient is on a ventilator. FINDINGS  Left Ventricle: Left  ventricular ejection fraction, by estimation, is 60 to 65%. The left ventricle has normal function. The left ventricle has no regional wall motion abnormalities. Global longitudinal strain performed but not reported based on interpreter judgement due to suboptimal tracking. The left ventricular internal cavity size was normal in size. There is moderate concentric left ventricular hypertrophy. Left ventricular diastolic parameters are consistent with Grade I diastolic dysfunction (impaired relaxation). Elevated left ventricular end-diastolic pressure. Right Ventricle: The right ventricular size is normal. No increase in right ventricular wall thickness. Right ventricular systolic function is normal. Left Atrium: Left atrial size was normal in size. Right Atrium: Right atrial size was normal in size. Pericardium: There is no evidence of pericardial effusion. Mitral Valve: The mitral valve is normal in structure. Moderate mitral annular calcification. Trivial mitral valve regurgitation. No evidence of mitral valve stenosis. Tricuspid Valve: The tricuspid valve is normal in structure. Tricuspid valve regurgitation is not demonstrated. No evidence of tricuspid stenosis. Aortic Valve: The aortic valve is tricuspid. There  is mild calcification of the aortic valve. There is mild thickening of the aortic valve. Aortic valve regurgitation is not visualized. No aortic stenosis is present. Aortic valve mean gradient measures 6.0 mmHg. Aortic valve peak gradient measures 13.5 mmHg. Aortic valve area, by VTI measures 1.69 cm. Pulmonic Valve: The pulmonic valve was normal in structure. Pulmonic valve regurgitation is trivial. No evidence of pulmonic stenosis. Aorta: The aortic root is normal in size and structure. Venous: IVC not dilated. Unable to assess for respiratory collapse as patient is on a ventilator. IAS/Shunts: No atrial level shunt detected by color flow Doppler.  LEFT VENTRICLE PLAX 2D LVIDd:         2.90 cm   Diastology LVIDs:         2.30 cm  LV e' lateral:   3.81 cm/s LV PW:         1.60 cm  LV E/e' lateral: 23.0 LV IVS:        1.60 cm LVOT diam:     2.00 cm LV SV:         56 LV SV Index:   31 LVOT Area:     3.14 cm 3D Volume EF:                         3D EF:        54 %                         LV EDV:       98 ml                         LV ESV:       45 ml                         LV SV:        52 ml RIGHT VENTRICLE RV Basal diam:  4.20 cm RV Mid diam:    3.40 cm LEFT ATRIUM             Index       RIGHT ATRIUM           Index LA diam:        3.80 cm 2.09 cm/m  RA Area:     16.50 cm LA Vol (A2C):   36.2 ml 19.92 ml/m RA Volume:   44.40 ml  24.44 ml/m LA Vol (A4C):   28.6 ml 15.74 ml/m LA Biplane Vol: 32.2 ml 17.72 ml/m  AORTIC VALVE AV Area (Vmax):    1.62 cm AV Area (Vmean):   1.85 cm AV Area (VTI):     1.69 cm AV Vmax:           184.00 cm/s AV Vmean:          118.000 cm/s AV VTI:            0.329 m AV Peak Grad:      13.5 mmHg AV Mean Grad:      6.0 mmHg LVOT Vmax:         94.60 cm/s LVOT Vmean:        69.500 cm/s LVOT VTI:          0.177 m LVOT/AV VTI ratio: 0.54  AORTA Ao Root diam: 3.10 cm Ao Asc diam:  3.20 cm MITRAL VALVE MV Area (PHT): 3.03 cm  SHUNTS MV Decel Time: 250 msec     Systemic VTI:  0.18 m MV E velocity: 87.80 cm/s   Systemic Diam: 2.00 cm MV A velocity: 139.00 cm/s MV E/A ratio:  0.63 Skeet Latch MD Electronically signed by Skeet Latch MD Signature Date/Time: 06/24/2020/4:32:03 PM    Final     Anti-infectives: Anti-infectives (From admission, onward)    Start     Dose/Rate Route Frequency Ordered Stop   06/23/20 1800  ceFEPIme (MAXIPIME) 2 g in sodium chloride 0.9 % 100 mL IVPB        2 g 200 mL/hr over 30 Minutes Intravenous Every 12 hours 06/23/20 0955     06/03/2020 1030  metroNIDAZOLE (FLAGYL) IVPB 500 mg        500 mg 100 mL/hr over 60 Minutes Intravenous Every 8 hours 06/22/2020 1000     06/07/2020 1030  ceFEPIme (MAXIPIME) 1 g in sodium chloride 0.9 % 100 mL IVPB   Status:  Discontinued        1 g 200 mL/hr over 30 Minutes Intravenous Every 24 hours 06/29/2020 1014 06/23/20 0955        Assessment/Plan  Symptomatic Cholelithiasis with possible acute cholecystitis Elevated Lipase without meeting laboratory criteria for Pancreatitis - WBC 25K -appreciate IR assistance in drain placement 6/21 -would treat for 7 days total of IV abx therapy for gallbladder.  -Gallbladder now adequately drained -brother not at bedside this morning when I was present -may start TFs from our standpoint -drain will need to stay in at least 6-8 weeks.  He can get a drain study by IR prior to that.  This patient will likely never be a surgical candidate given current state. -will arrange FU with Dr. Dema Severin for 2-3 months out, but unlikely to need anything further acutely or chronically from our standpoint. -we will sign off at this time.  Discussed with CCM   FEN - may start TFs from our standpoint VTE - SCDs, lovenox ID - Cefepime/Flagyl >> treat for 7 days total for gb   COPD DM2 ESRD on HD T/T/S HLD HTN S/p PEA arrest now with suspect anoxic brain injury.  Poor prognosis.    LOS: 5 days    Henreitta Cea, [facility] 06/25/2020, 9:22 AM Please see Amion for pager number during day hours 7:00am-4:30pm

## 2020-06-25 NOTE — Progress Notes (Signed)
[facility] Progress Note   Subjective:    Stable on CRRT, not clotting Off pressors, in  fact hypertensive-  neurology has seen -  unfortunately seems like poor neuro prognosis AM labs K 5.2 and P 5.0 nephew at bedside, updated-  brother is speaking with neurologist    Objective Vitals:   06/25/20 0500 06/25/20 0520 06/25/20 0600 06/25/20 0700  BP:  (!) 166/71 140/67   Pulse: 84  89 88  Resp: (!) 21  (!) 24 18  Temp: 98.24 F (36.8 C)  98.06 F (36.7 C) 97.88 F (36.6 C)  TempSrc:      SpO2: 100%  98% 98%  Weight:      Height:       Physical Exam General: intubated, sedated Heart: Regular Lungs: Coarse bs b/l Abdomen: Round, distended but soft, active bowel sounds Extremities: No edema bilateral lower extremities Dialysis Access: [facility] R chest  NEURO: encephalopathic  Filed Weights   06/23/20 1345 06/24/20 0500 06/25/20 0200  Weight: 84.2 kg 82.8 kg 80.4 kg    Intake/Output Summary (Last 24 hours) at 06/25/2020 0807 Last data filed at 06/25/2020 0700 Gross per 24 hour  Intake 2553.46 ml  Output 4182 ml  Net -1628.54 ml    Additional Objective Labs: Basic Metabolic Panel: Recent Labs  Lab 06/24/20 0316 06/24/20 1227 06/24/20 1520 06/25/20 0352  NA 132*  --  137 134*  K 4.7  --  3.4* 5.2*  CL 99  --  114* 102  CO2 23  --  17* 23  GLUCOSE 109*  --  112* 166*  BUN 40*  --  29* 34*  CREATININE 5.29*  --  3.06* 3.20*  CALCIUM 7.9*  --  6.0* 8.2*  PHOS 3.8 4.1 2.8 5.0*   Liver Function Tests: Recent Labs  Lab 06/23/20 0517 06/23/20 1605 06/24/20 0316 06/24/20 1520 06/25/20 0352  AST 901*  --  207*  --  89*  ALT 352*  --  240*  --  179*  ALKPHOS 168*  --  139*  --  163*  BILITOT 1.9*  --  0.8  --  0.5  PROT 5.9*  --  5.7*  --  6.2*  ALBUMIN 2.6*   < > 2.4* 1.7* 2.6*   < > = values in this interval not displayed.   Recent Labs  Lab 06/25/2020 0720 06/21/20 0233 06/22/20 0103  LIPASE 108* 87* 40   CBC: Recent Labs  Lab  06/17/2020 0720 06/21/20 0233 06/22/20 0103 06/22/20 2151 06/22/20 2233 06/23/20 0517 06/24/20 0316 06/25/20 0352  WBC 10.1   < > 25.8* 29.3*  --  30.4* 22.6* 25.8*  NEUTROABS 8.7*  --   --  22.4*  --   --   --   --   HGB 10.4*   < > 8.6* 8.5*   < > 8.4* 8.3* 8.7*  HCT 32.1*   < > 26.1* 26.5*   < > 25.7* 24.1* 26.6*  MCV 94.4   < > 93.5 97.8  --  94.5 90.3 94.7  PLT 335   < > 283 194  --  272 289 311   < > = values in this interval not displayed.   Blood Culture    Component Value Date/Time   SDES BLOOD RIGHT ANTECUBITAL 12/09/2019 0842   SPECREQUEST  12/09/2019 0842    BOTTLES DRAWN AEROBIC AND ANAEROBIC Blood Culture adequate volume   CULT  12/09/2019 0842    NO GROWTH 5 DAYS Performed  at [facility], [address] [place], [place] [address]    REPTSTATUS 12/14/2019 FINAL 12/09/2019 1275    Cardiac Enzymes: No results for input(s): CKTOTAL, CKMB, CKMBINDEX, TROPONINI in the last 168 hours. CBG: Recent Labs  Lab 06/24/20 1559 06/24/20 2043 06/25/20 0018 06/25/20 0356 06/25/20 0733  GLUCAP 136* 213* 210* 166* 149*   Iron Studies: No results for input(s): IRON, TIBC, TRANSFERRIN, FERRITIN in the last 72 hours. Lab Results  Component Value Date   INR 1.1 12/05/2019   INR 1.1 05/12/2019   Studies/Results: CT HEAD WO CONTRAST  Result Date: 06/24/2020 CLINICAL DATA:  Status post cardiac arrest. EXAM: CT HEAD WITHOUT CONTRAST TECHNIQUE: Contiguous axial images were obtained from the base of the skull through the vertex without intravenous contrast. COMPARISON:  April 29, 2020. FINDINGS: Brain: Decreased size of lenticular extra-axial collection along the high RIGHT posterior frontal and parietal convexity with mixed attenuation material layering dependently. No midline shift. No intraparenchymal hemorrhage. Tiny extra-axial collection, small subdural may be present along the LEFT convexity as well better seen on coronal images decreased in conspicuity compared  to the previous exam no hydrocephalus. RIGHT sided extra-axial collection in coronal plane measuring 4.8 x 1.0 cm in greatest dimension as compared to 5.8 x 1.8 cm on the prior study. Less mass effect upon adjacent sulci (image 51/5) Very subtle area of extra-axial material on image 31 of series 5 May correspond to previous presumed extra-axial collection/small subdural. Diminished conspicuity of gray-white differentiation may be present. Vascular: No hyperdense vessel or unexpected calcification. Skull: Normal. Negative for fracture or focal lesion. Sinuses/Orbits: Scattered ethmoid opacification and signs of sphenoid sinus disease. Other: None IMPRESSION: 1. Loss of gray-white differentiation subtle in most areas but more pronounced particularly in the basal ganglia on the RIGHT compatible with early changes related to hypoxic/ischemic injury. No herniation or significant changes of edema at this time. 2. Decreased size of mixed density extra-axial collection along the RIGHT posterior frontal parietal region with decreased mass effect. 3. Tiny extra-axial collection, small subdural may be present along the LEFT convexity as well better seen on coronal images decreased in conspicuity compared to the previous exam. 4. Potential diminished gray-white differentiation. 5. No midline shift. 6. Scattered ethmoid opacification and signs of sphenoid sinus disease. 7. Critical Value/emergent results were called by telephone at the time of interpretation on 06/24/2020 at 11:31 am to provider [facility] , who verbally acknowledged these results. Electronically Signed   By: Zetta Bills M.D.   On: 06/24/2020 10:58   IR Perc Cholecystostomy  Result Date: 06/23/2020 INDICATION: Acute cholecystitis. Please perform image guided cholecystostomy tube placement for infection source control purposes. EXAM: ULTRASOUND AND FLUOROSCOPIC-GUIDED CHOLECYSTOSTOMY TUBE PLACEMENT COMPARISON:  CT abdomen and pelvis-06/22/2020; right upper  quadrant abdominal ultrasound-06/06/2020 MEDICATIONS: The patient is currently admitted to the hospital and on intravenous antibiotics. Antibiotics were administered within an appropriate time frame prior to skin puncture. ANESTHESIA/SEDATION: Patient is currently intubated and sedated in the ICU. CONTRAST:  None, no contrast was administered secondary to patient's anaphylactic reaction to intravenous contrast during abdominal CT performed 06/22/2020. FLUOROSCOPY TIME:  18 seconds (3 mGy) COMPLICATIONS: None immediate. PROCEDURE: Informed written consent was obtained from the patient's family after a discussion of the risks, benefits and alternatives to treatment. Questions regarding the procedure were encouraged and answered. A timeout was performed prior to the initiation of the procedure. The right upper abdominal quadrant was prepped and draped in the usual sterile fashion, and a sterile drape was  applied covering the operative field. Maximum barrier sterile technique with sterile gowns and gloves were used for the procedure. A timeout was performed prior to the initiation of the procedure. Local anesthesia was provided with 1% lidocaine with epinephrine. Ultrasound scanning of the right upper quadrant demonstrates a markedly dilated gallbladder with gallbladder wall thickening and minimal amount of pericholecystic fluid. Utilizing a transhepatic approach, a 22 gauge needle was advanced into the gallbladder under direct ultrasound guidance. An ultrasound image was saved for documentation purposes. Appropriate intraluminal puncture was confirmed with the efflux of bile and advancement of an 0.018 wire into the gallbladder lumen. The needle was exchanged for an [place] set. A small amount of contrast was injected to confirm appropriate intraluminal positioning. Over a [place] wire, a 13.2-French Cook cholecystomy tube was advanced into the gallbladder fossa, coiled and locked. Bile was aspirated and several post  procedural spot radiographic images were obtained in various obliquities. Again, no contrast was administered secondary to patient's anaphylaxis to intravenous contrast received during abdominal CT performed 06/22/2020. The catheter was secured to the skin with suture, connected to a drainage bag and a dressing was placed. The patient tolerated the procedure well without immediate post procedural complication. IMPRESSION: Successful ultrasound and fluoroscopic guided placement of a 10.2 French cholecystostomy tube. Electronically Signed   By: Sandi Mariscal M.D.   On: 06/23/2020 14:26   DG CHEST PORT 1 VIEW  Result Date: 06/24/2020 CLINICAL DATA:  Respiratory failure.  Intubation. EXAM: PORTABLE CHEST 1 VIEW COMPARISON:  06/22/2020. FINDINGS: Endotracheal tube tip 1.5 cm above the carina. NG tube tip below left hemidiaphragm. Dialysis catheter in stable position. Cardiomegaly. No pulmonary venous congestion. Low lung volumes with prominent bibasilar subsegmental atelectasis noted on today's exam. No pleural effusion or pneumothorax. IMPRESSION: Endotracheal tube tip 1.5 cm above the carina. NG tube tip below left hemidiaphragm. Dialysis catheter stable position. 2.  Cardiomegaly.  No pulmonary venous congestion. 3. Low lung volumes with prominent bibasilar subsegmental atelectasis noted on today's exam. Electronically Signed   By: [place]   On: 06/24/2020 07:13   Overnight EEG with video  Result Date: 06/23/2020 Lora Havens, MD     06/24/2020  8:48 AM Patient Name: Daniel Thomas MRN: [id number] Epilepsy Attending: Lora Havens Referring Physician/Provider: Dr. Kathrynn Speed Duration: 06/23/2020 0203 to 06/24/2020 0203 Patient history: 65 year old male status post cardiac arrest.  EEG done for seizures. Level of alertness: Comatose. AEDs during EEG study: Keppra, Depakote, propofol Technical aspects: This EEG study was done with scalp electrodes positioned according to the 10-20  International system of electrode placement. Electrical activity was acquired at a sampling rate of $Remov'500Hz'MJrZca$  and reviewed with a high frequency filter of $RemoveB'70Hz'QWdrPNrf$  and a low frequency filter of $RemoveB'1Hz'tImOvjFP$ . EEG data were recorded continuously and digitally stored. Description: EEG initially showed near continuous generalized background suppression.  Every 2 to 15 seconds generalized highly epileptiform bursts were noted.  During the burst patient was noted to have brief generalized whole body jerking consistent with myoclonic seizure. After around 8 AM on 06/23/2020, EEG predominantly showed generalized background suppression. After around 2300 on 06/14/2020, EEG again showed intermittent bursts of high amplitude 3 to 5 Hz sharply contoured theta-delta slowing lasting 0.5-1 second every 10-30 seconds. EEG was not reactive to tactile stimulation. Hyperventilation and photic stimulation were not performed.   ABNORMALITY -Myoclonic seizure, generalized -Burst suppression with highly epileptiform discharges, generalized IMPRESSION: This study initially showed myoclonic seizures every 2 to 15 seconds. After around 8  AM on 06/23/2020, no further seizures were noted. EEG also showed profound diffuse encephalopathy.  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 Lora Havens   ECHOCARDIOGRAM COMPLETE  Result Date: 06/24/2020    ECHOCARDIOGRAM REPORT   Patient Name:   Daniel Thomas Date of Exam: 06/24/2020 Medical Rec #:  [id number]      Height:       61.0 in Accession #:    [id number]     Weight:       182.5 lb Date of Birth:  1955-06-22      BSA:          1.817 m Patient Age:    65 years       BP:           149/68 mmHg Patient Gender: M              HR:           80 bpm. Exam Location:  Inpatient Procedure: 2D Echo, Cardiac Doppler, Color Doppler, Strain Analysis and 3D Echo Indications:    Cardiac Arrest I46.9  History:        Patient has prior history of Echocardiogram examinations,  most                 recent 12/18/2019. Signs/Symptoms:Shortness of Breath; Risk                 Factors:Hypertension.  Sonographer:    Merrie Roof RDCS Referring Phys: [id number] RAHUL P DESAI IMPRESSIONS  1. Left ventricular ejection fraction, by estimation, is 60 to 65%. The left ventricle has normal function. The left ventricle has no regional wall motion abnormalities. There is moderate concentric left ventricular hypertrophy. Left ventricular diastolic parameters are consistent with Grade I diastolic dysfunction (impaired relaxation). Elevated left ventricular end-diastolic pressure.  2. Right ventricular systolic function is normal. The right ventricular size is normal.  3. The mitral valve is normal in structure. Trivial mitral valve regurgitation. No evidence of mitral stenosis. Moderate mitral annular calcification.  4. The aortic valve is tricuspid. There is mild calcification of the aortic valve. There is mild thickening of the aortic valve. Aortic valve regurgitation is not visualized. No aortic stenosis is present.  5. IVC not dilated. Unable to assess for respiratory collapse as patient is on a ventilator. FINDINGS  Left Ventricle: Left ventricular ejection fraction, by estimation, is 60 to 65%. The left ventricle has normal function. The left ventricle has no regional wall motion abnormalities. Global longitudinal strain performed but not reported based on interpreter judgement due to suboptimal tracking. The left ventricular internal cavity size was normal in size. There is moderate concentric left ventricular hypertrophy. Left ventricular diastolic parameters are consistent with Grade I diastolic dysfunction (impaired relaxation). Elevated left ventricular end-diastolic pressure. Right Ventricle: The right ventricular size is normal. No increase in right ventricular wall thickness. Right ventricular systolic function is normal. Left Atrium: Left atrial size was normal in size. Right Atrium: Right  atrial size was normal in size. Pericardium: There is no evidence of pericardial effusion. Mitral Valve: The mitral valve is normal in structure. Moderate mitral annular calcification. Trivial mitral valve regurgitation. No evidence of mitral valve stenosis. Tricuspid Valve: The tricuspid valve is normal in structure. Tricuspid valve regurgitation is not demonstrated. No evidence of tricuspid stenosis. Aortic Valve: The aortic valve is tricuspid. There is mild calcification of the aortic valve. There is mild thickening of the aortic valve.  Aortic valve regurgitation is not visualized. No aortic stenosis is present. Aortic valve mean gradient measures 6.0 mmHg. Aortic valve peak gradient measures 13.5 mmHg. Aortic valve area, by VTI measures 1.69 cm. Pulmonic Valve: The pulmonic valve was normal in structure. Pulmonic valve regurgitation is trivial. No evidence of pulmonic stenosis. Aorta: The aortic root is normal in size and structure. Venous: IVC not dilated. Unable to assess for respiratory collapse as patient is on a ventilator. IAS/Shunts: No atrial level shunt detected by color flow Doppler.  LEFT VENTRICLE PLAX 2D LVIDd:         2.90 cm  Diastology LVIDs:         2.30 cm  LV e' lateral:   3.81 cm/s LV PW:         1.60 cm  LV E/e' lateral: 23.0 LV IVS:        1.60 cm LVOT diam:     2.00 cm LV SV:         56 LV SV Index:   31 LVOT Area:     3.14 cm 3D Volume EF:                         3D EF:        54 %                         LV EDV:       98 ml                         LV ESV:       45 ml                         LV SV:        52 ml RIGHT VENTRICLE RV Basal diam:  4.20 cm RV Mid diam:    3.40 cm LEFT ATRIUM             Index       RIGHT ATRIUM           Index LA diam:        3.80 cm 2.09 cm/m  RA Area:     16.50 cm LA Vol (A2C):   36.2 ml 19.92 ml/m RA Volume:   44.40 ml  24.44 ml/m LA Vol (A4C):   28.6 ml 15.74 ml/m LA Biplane Vol: 32.2 ml 17.72 ml/m  AORTIC VALVE AV Area (Vmax):    1.62 cm AV Area  (Vmean):   1.85 cm AV Area (VTI):     1.69 cm AV Vmax:           184.00 cm/s AV Vmean:          118.000 cm/s AV VTI:            0.329 m AV Peak Grad:      13.5 mmHg AV Mean Grad:      6.0 mmHg LVOT Vmax:         94.60 cm/s LVOT Vmean:        69.500 cm/s LVOT VTI:          0.177 m LVOT/AV VTI ratio: 0.54  AORTA Ao Root diam: 3.10 cm Ao Asc diam:  3.20 cm MITRAL VALVE MV Area (PHT): 3.03 cm     SHUNTS MV Decel Time: 250 msec     Systemic VTI:  0.18 m MV E velocity: 87.80 cm/s   Systemic Diam: 2.00 cm MV A velocity: 139.00 cm/s MV E/A ratio:  0.63 Skeet Latch MD Electronically signed by Skeet Latch MD Signature Date/Time: 06/24/2020/4:32:03 PM    Final     Medications:   prismasol BGK 4/2.5 500 mL/hr at 06/24/20 2300    prismasol BGK 4/2.5 300 mL/hr at 06/25/20 0215   ceFEPime (MAXIPIME) IV Stopped (06/25/20 0650)   famotidine (PEPCID) IV Stopped (06/24/20 2205)   levETIRAcetam Stopped (06/25/20 0156)   metronidazole Stopped (06/25/20 0311)   potassium PHOSPHATE IVPB (in mmol) Stopped (06/24/20 2342)   prismasol BGK 4/2.5 1,500 mL/hr at 06/25/20 0805   propofol (DIPRIVAN) infusion Stopped (06/25/20 0619)    artificial tears  1 application Both Eyes Q4Y   B-complex with vitamin C  1 tablet Per Tube Daily   calcitRIOL  0.25 mcg Per Tube Once per day on Tue Thu Sat   chlorhexidine gluconate (MEDLINE KIT)  15 mL Mouth Rinse BID   Chlorhexidine Gluconate Cloth  6 each Topical Daily   darbepoetin (ARANESP) injection - DIALYSIS  60 mcg Subcutaneous Weekly   diphenhydrAMINE  25 mg Intravenous Q6H   docusate  100 mg Per Tube BID   feeding supplement (PROSource TF)  90 mL Per Tube TID   feeding supplement (VITAL 1.5 CAL)  1,000 mL Per Tube Q24H   heparin injection (subcutaneous)  5,000 Units Subcutaneous Q8H   insulin aspart  1-3 Units Subcutaneous Q4H   insulin detemir  5 Units Subcutaneous Q12H   mouth rinse  15 mL Mouth Rinse 10 times per day   methylPREDNISolone (SOLU-MEDROL) injection   60 mg Intravenous Q12H   mupirocin ointment  1 application Nasal BID   polyethylene glycol  17 g Per Tube Daily   sodium chloride flush  10-40 mL Intracatheter Q12H   sodium chloride flush  5 mL Intracatheter Q8H   valproic acid  250 mg Per Tube TID    Dialysis Orders: TTS [place] [facility])  4h 36mn   70.5kg  400/500   2/2.25 bath  TDC  Hep 1000  - calc 0.276m PO q HD  - mircera 50 q4 last 5/31  - venofer 50 /wk  - sensipar 12017mO q HD  Assessment/Plan: S/p cardiac arrest, prolonged time until ROSC 06/22/20.  Likely anaphylaxis to IV contrast Like severe anoxic brain injury, neuro following Acute cholecysttis, on Cefepime/Flagyl, CCS following; s/p perc drain 6/21; cefepime flagyl ESRD: R IJ TDC. [place] but with K over 5 this AM may not be the right time-  will change dialysate bath to 2 K and look to stop CRRT tomorrow and do IHD on Sat,  some UF.  Not on heparin or citrate at this time.  P ok.  Hypotension post #1 now off pressors and with compensatory hypertension Anemia: Hgb stable 8s, transfuse as needed.   CKD-BMD Ca ok,  PCM Mild/mod hyponatremia: improving with CRRT.    KelLouis MeckelD  [facility] 06/25/2020,8:07 AM  LOS: 5 days

## 2020-06-25 NOTE — Progress Notes (Signed)
Referring Physician(s): Saverio Danker PA-C  Supervising Physician: Arne Cleveland  Patient Status:  [facility] - In-pt  Chief Complaint:  History of acute cholecystitis s/p percutaneous cholecystostomy tube placement in IR 06/23/2020.  Subjective:  Pt laying in bed, intubated and sedated, on CRRT. RN at the bedside, reports that drain is working well.   Allergies: Contrast media [iodinated diagnostic agents] and Tape  Medications: Prior to Admission medications   Medication Sig Start Date End Date Taking? Authorizing Provider  albuterol (VENTOLIN HFA) 108 (90 Base) MCG/ACT inhaler Inhale 2 puffs into the lungs every 6 (six) hours as needed for wheezing or shortness of breath.   Yes [provider]  amLODipine (NORVASC) 10 MG tablet Take 1 tablet (10 mg total) by mouth at bedtime. 12/05/19  Yes Wieting, Richard, MD  atorvastatin (LIPITOR) 40 MG tablet Take 40 mg by mouth at bedtime.   Yes [provider]  calcium carbonate (TUMS - DOSED IN MG ELEMENTAL CALCIUM) 500 MG chewable tablet Chew 1 tablet by mouth every 6 (six) hours as needed for indigestion.   Yes [provider]  carvedilol (COREG) 12.5 MG tablet Take 12.5 mg by mouth 2 (two) times daily with a meal. 03/31/18  Yes [provider]  citalopram (CELEXA) 20 MG tablet Take 20 mg by mouth daily.   Yes [provider]  ergocalciferol (VITAMIN D2) 1.25 MG (50000 UT) capsule Take by mouth. Administered at analysis 02/02/18  Yes [provider]  ferric citrate (AURYXIA) 1 GM 210 MG(Fe) tablet Take by mouth.  04/29/19  Yes [provider]  fluticasone (FLONASE) 50 MCG/ACT nasal spray Place 1 spray into both nostrils daily. 04/27/20  Yes [provider]  hydrALAZINE (APRESOLINE) 25 MG tablet Take 1 tablet (25 mg total) by mouth every 8 (eight) hours. 05/17/19  Yes Nolberto Hanlon, MD  linagliptin (TRADJENTA) 5 MG TABS tablet Take 5 mg by mouth daily.   Yes [provider]  sevelamer carbonate (RENVELA) 800 MG tablet Take 1,600 mg by mouth 3 (three) times daily with meals. 12/23/19  Yes [provider]  traZODone (DESYREL) 50 MG tablet Take 25 mg by mouth at bedtime.   Yes [provider]  lidocaine (LIDODERM) 5 % Place 1 patch onto the skin every 12 (twelve) hours. Remove & Discard patch within 12 hours or as directed by MD Patient not taking: No sig reported 11/10/19 11/09/20  Laban Emperor, PA-C  losartan (COZAAR) 50 MG tablet Take 1 tablet (50 mg total) by mouth at bedtime. 12/05/19   Loletha Grayer, MD     Vital Signs: BP (!) 154/68   Pulse 92   Temp 98.06 F (36.7 C)   Resp (!) 22   Ht 5\' 1"  (1.549 m)   Wt 177 lb 4 oz (80.4 kg)   SpO2 98%   BMI 33.49 kg/m   Physical Exam Vitals reviewed.  Constitutional:      General: He is not in acute distress.    Appearance: He is ill-appearing.  Cardiovascular:     Rate and Rhythm: Normal rate.  Pulmonary:     Comments: Intubated  Abdominal:     General: Abdomen is flat.     Palpations: Abdomen is soft.  Skin:    General: Skin is warm and dry.     Coloration: Skin is not jaundiced.     Comments: Positive RUQ drain to gravity bag. Site is unremarkable with no erythema, edema, tenderness, bleeding or drainage. Suture and stat  lock in place. Dressing is clean, dry, and intact. 20 ml of  cloudy bile colored fluid noted in the gravity bag. Drain aspirates and flushes well.    Neurological:     Mental Status: He is alert. He is disoriented.    Imaging: CT HEAD WO CONTRAST  Result Date: 06/24/2020 CLINICAL DATA:  Status post cardiac arrest. EXAM: CT HEAD WITHOUT CONTRAST TECHNIQUE: Contiguous axial images were obtained from the base of the skull through the vertex without intravenous contrast. COMPARISON:  April 29, 2020. FINDINGS: Brain: Decreased size of lenticular extra-axial collection along the high RIGHT posterior frontal and parietal convexity with mixed  attenuation material layering dependently. No midline shift. No intraparenchymal hemorrhage. Tiny extra-axial collection, small subdural may be present along the LEFT convexity as well better seen on coronal images decreased in conspicuity compared to the previous exam no hydrocephalus. RIGHT sided extra-axial collection in coronal plane measuring 4.8 x 1.0 cm in greatest dimension as compared to 5.8 x 1.8 cm on the prior study. Less mass effect upon adjacent sulci (image 51/5) Very subtle area of extra-axial material on image 31 of series 5 May correspond to previous presumed extra-axial collection/small subdural. Diminished conspicuity of gray-white differentiation may be present. Vascular: No hyperdense vessel or unexpected calcification. Skull: Normal. Negative for fracture or focal lesion. Sinuses/Orbits: Scattered ethmoid opacification and signs of sphenoid sinus disease. Other: None IMPRESSION: 1. Loss of gray-white differentiation subtle in most areas but more pronounced particularly in the basal ganglia on the RIGHT compatible with early changes related to hypoxic/ischemic injury. No herniation or significant changes of edema at this time. 2. Decreased size of mixed density extra-axial collection along the RIGHT posterior frontal parietal region with decreased mass effect. 3. Tiny extra-axial collection, small subdural may be present along the LEFT convexity as well better seen on coronal images decreased in conspicuity compared to the previous exam. 4. Potential diminished gray-white differentiation. 5. No midline shift. 6. Scattered ethmoid opacification and signs of sphenoid sinus disease. 7. Critical Value/emergent results were called by telephone at the time of interpretation on 06/24/2020 at 11:31 am to provider [facility] , who verbally acknowledged these results. Electronically Signed   By: Zetta Bills M.D.   On: 06/24/2020 10:58   CT ABDOMEN PELVIS W CONTRAST  Result Date:  06/22/2020 CLINICAL DATA:  65 year old male with abdominal pain. EXAM: CT ABDOMEN AND PELVIS WITH CONTRAST TECHNIQUE: Multidetector CT imaging of the abdomen and pelvis was performed using the standard protocol following bolus administration of intravenous contrast. CONTRAST:  116mL OMNIPAQUE IOHEXOL 300 MG/ML  SOLN COMPARISON:  Abdominal ultrasound dated 06/26/2020. CT abdomen pelvis dated 11/10/2019. FINDINGS: Evaluation of this exam is limited due to respiratory motion artifact. Lower chest: Bibasilar linear atelectasis. There is coronary vascular calcification and calcification of the mitral annulus. Partially visualized central venous line with tip at the cavoatrial junction. No intra-abdominal free air or free fluid. Hepatobiliary: The liver is unremarkable. No intrahepatic biliary dilatation. There is a stone the gallbladder. The gallbladder is distended. There is gallbladder wall edema with pericholecystic fluid and stranding. Findings most consistent with acute cholecystitis. Further evaluation with ultrasound recommended. Pancreas: Unremarkable. No pancreatic ductal dilatation or surrounding inflammatory changes. Spleen: There is an area of hypoenhancement involving the midportion of the spleen most concerning for splenic infarct. Adrenals/Urinary Tract: The adrenal glands unremarkable. Vascular calcification versus small nonobstructing bilateral renal calculi measure up to 3 mm in the lower pole of the right kidney. There is no hydronephrosis on either  side. There is slight heterogeneous enhancement the renal parenchyma. Correlation with urinalysis recommended to exclude UTI. The visualized ureters and urinary bladder appear unremarkable. Stomach/Bowel: There is no bowel obstruction or active inflammation. The appendix is normal. Vascular/Lymphatic: Mild aortoiliac atherosclerotic disease. The IVC is unremarkable. No portal venous gas. There is no adenopathy. Reproductive: Enlarged prostate gland  measuring 5 cm in transverse axial diameter. The seminal vesicles are symmetric. Other: Small fat containing bilateral inguinal hernia. There is slight protrusion of sigmoid colon into the left inguinal canal. Musculoskeletal: Osteopenia with degenerative changes of the spine. Old L1 compression fracture. There is compression fracture of inferior endplate of V76, new since the prior CT. Correlation with clinical exam and point tenderness recommended. IMPRESSION: 1. Cholelithiasis with findings most consistent with acute cholecystitis. Further evaluation with ultrasound recommended. 2. 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 3. No bowel obstruction. Normal appendix. 4. Aortic Atherosclerosis (ICD10-I70.0). Electronically Signed   By: Anner Crete M.D.   On: 06/22/2020 21:28   IR Perc Cholecystostomy  Result Date: 06/23/2020 INDICATION: Acute cholecystitis. Please perform image guided cholecystostomy tube placement for infection source control purposes. EXAM: ULTRASOUND AND FLUOROSCOPIC-GUIDED CHOLECYSTOSTOMY TUBE PLACEMENT COMPARISON:  CT abdomen and pelvis-06/22/2020; right upper quadrant abdominal ultrasound-06/19/2020 MEDICATIONS: The patient is currently admitted to the hospital and on intravenous antibiotics. Antibiotics were administered within an appropriate time frame prior to skin puncture. ANESTHESIA/SEDATION: Patient is currently intubated and sedated in the ICU. CONTRAST:  None, no contrast was administered secondary to patient's anaphylactic reaction to intravenous contrast during abdominal CT performed 06/22/2020. FLUOROSCOPY TIME:  18 seconds (3 mGy) COMPLICATIONS: None immediate. PROCEDURE: Informed written consent was obtained from the patient's family after a discussion of the risks, benefits and alternatives to treatment. Questions regarding the procedure were encouraged and answered. A timeout was performed prior to the initiation of the procedure. The right upper abdominal quadrant was prepped and  draped in the usual sterile fashion, and a sterile drape was applied covering the operative field. Maximum barrier sterile technique with sterile gowns and gloves were used for the procedure. A timeout was performed prior to the initiation of the procedure. Local anesthesia was provided with 1% lidocaine with epinephrine. Ultrasound scanning of the right upper quadrant demonstrates a markedly dilated gallbladder with gallbladder wall thickening and minimal amount of pericholecystic fluid. Utilizing a transhepatic approach, a 22 gauge needle was advanced into the gallbladder under direct ultrasound guidance. An ultrasound image was saved for documentation purposes. Appropriate intraluminal puncture was confirmed with the efflux of bile and advancement of an 0.018 wire into the gallbladder lumen. The needle was exchanged for an [place] set. A small amount of contrast was injected to confirm appropriate intraluminal positioning. Over a Benson wire, a 26.2-French Cook cholecystomy tube was advanced into the gallbladder fossa, coiled and locked. Bile was aspirated and several post procedural spot radiographic images were obtained in various obliquities. Again, no contrast was administered secondary to patient's anaphylaxis to intravenous contrast received during abdominal CT performed 06/22/2020. The catheter was secured to the skin with suture, connected to a drainage bag and a dressing was placed. The patient tolerated the procedure well without immediate post procedural complication. IMPRESSION: Successful ultrasound and fluoroscopic guided placement of a 10.2 French cholecystostomy tube. Electronically Signed   By: Sandi Mariscal M.D.   On: 06/23/2020 14:26   DG CHEST PORT 1 VIEW  Result Date: 06/24/2020 CLINICAL DATA:  Respiratory failure.  Intubation. EXAM: PORTABLE CHEST 1 VIEW COMPARISON:  06/22/2020. FINDINGS: Endotracheal tube tip 1.5 cm  above the carina. NG tube tip below left hemidiaphragm. Dialysis  catheter in stable position. Cardiomegaly. No pulmonary venous congestion. Low lung volumes with prominent bibasilar subsegmental atelectasis noted on today's exam. No pleural effusion or pneumothorax. IMPRESSION: Endotracheal tube tip 1.5 cm above the carina. NG tube tip below left hemidiaphragm. Dialysis catheter stable position. 2.  Cardiomegaly.  No pulmonary venous congestion. 3. Low lung volumes with prominent bibasilar subsegmental atelectasis noted on today's exam. Electronically Signed   By: Marcello Moores  Register   On: 06/24/2020 07:13   DG CHEST PORT 1 VIEW  Result Date: 06/22/2020 CLINICAL DATA:  Emergent intubation with NG tube placement. EXAM: PORTABLE CHEST 1 VIEW COMPARISON:  Chest radiograph 12/05/2019 FINDINGS: Endotracheal tube tip is 3.7 cm from the carina. Tip and side port of the enteric tube below the diaphragm in the stomach there is a right internal jugular dialysis catheter with tip at the atrial caval junction. Previous left-sided dialysis catheter is no longer seen. Stable heart size and mediastinal contours vascular congestion. No focal airspace disease. No pneumothorax. No acute osseous abnormalities are seen. IMPRESSION: 1. Endotracheal tube tip 3.7 cm from the carina. Enteric tube in place. 2. Right internal jugular dialysis catheter with tip at the atrial caval junction. 3. Vascular congestion. Electronically Signed   By: Keith Rake M.D.   On: 06/22/2020 22:42   DG Abd Portable 1V  Result Date: 06/22/2020 CLINICAL DATA:  NG tube placement. EXAM: PORTABLE ABDOMEN - 1 VIEW COMPARISON:  Abdominopelvic CT earlier today FINDINGS: Tip and side port of the enteric tube below the diaphragm in the stomach enteric contrast is seen throughout the colon from prior CT. No free air or obstruction. Known gallstone is not well visualized IMPRESSION: Tip and side port of the enteric tube below the diaphragm in the stomach. Electronically Signed   By: Keith Rake M.D.   On: 06/22/2020  22:42   EEG adult  Result Date: 06/23/2020 Greta Doom, MD     06/23/2020  1:54 AM History: 65 yo M s/p cardiac arrest. Sedation: Propofol 5 mcg/kg/min Technique: This is a 21 channel routine scalp EEG performed at the bedside with bipolar and monopolar montages arranged in accordance to the international 10/20 system of electrode placement. One channel was dedicated to EKG recording. Background: The background is diffusely attenuated with occasional epileptiform bursts lasting 1-2 seconds associated with generalized myoclonus.  Photic stimulation: Physiologic driving is not performed EEG Abnormalities: 1) Burst suppression EEG with myoclonus assocaited with bursts Clinical Interpretation: This EEG demonstrates evidence of profound generalized cerebral dysfunction as can be seen with hypoxic brain injury. In the post-cardiac arrest setting, this EEG is highly correlated with poor prognosis. Levetiracetam and valproic acid can both sometimes be useful in controlling the symptomatic movements, as can propofol. Roland Rack, MD [facility] [phone] If 7pm- 7am, please page neurology on call as listed in [place].   Overnight EEG with video  Result Date: 06/23/2020 Lora Havens, MD     06/24/2020  8:48 AM Patient Name: LEEON MAKAR MRN: [id number] Epilepsy Attending: Lora Havens Referring Physician/Provider: Dr. Kathrynn Speed Duration: 06/23/2020 0203 to 06/24/2020 0203 Patient history: 65 year old male status post cardiac arrest.  EEG done for seizures. Level of alertness: Comatose. AEDs during EEG study: Keppra, Depakote, propofol Technical aspects: This EEG study was done with scalp electrodes positioned according to the 10-20 International system of electrode placement. Electrical activity was acquired at a sampling rate of 500Hz  and reviewed with a high frequency  filter of 70Hz  and a low frequency filter of 1Hz . EEG data were recorded continuously and digitally  stored. Description: EEG initially showed near continuous generalized background suppression.  Every 2 to 15 seconds generalized highly epileptiform bursts were noted.  During the burst patient was noted to have brief generalized whole body jerking consistent with myoclonic seizure. After around 8 AM on 06/23/2020, EEG predominantly showed generalized background suppression. After around 2300 on 06/14/2020, EEG again showed intermittent bursts of high amplitude 3 to 5 Hz sharply contoured theta-delta slowing lasting 0.5-1 second every 10-30 seconds. EEG was not reactive to tactile stimulation. Hyperventilation and photic stimulation were not performed.   ABNORMALITY -Myoclonic seizure, generalized -Burst suppression with highly epileptiform discharges, generalized IMPRESSION: This study initially showed myoclonic seizures every 2 to 15 seconds. After around 8 AM on 06/23/2020, no further seizures were noted. EEG also showed profound diffuse encephalopathy.  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 Lora Havens   ECHOCARDIOGRAM COMPLETE  Result Date: 06/24/2020    ECHOCARDIOGRAM REPORT   Patient Name:   KAMERIN GRUMBINE Date of Exam: 06/24/2020 Medical Rec #:  [id number]      Height:       61.0 in Accession #:    [id number]     Weight:       182.5 lb Date of Birth:  [birth year]      BSA:          1.817 m Patient Age:    33 years       BP:           149/68 mmHg Patient Gender: M              HR:           80 bpm. Exam Location:  Inpatient Procedure: 2D Echo, Cardiac Doppler, Color Doppler, Strain Analysis and 3D Echo Indications:    Cardiac Arrest I46.9  History:        Patient has prior history of Echocardiogram examinations, most                 recent 12/18/2019. Signs/Symptoms:Shortness of Breath; Risk                 Factors:Hypertension.  Sonographer:    Merrie Roof RDCS Referring Phys: [id number] RAHUL P DESAI IMPRESSIONS  1. Left ventricular ejection fraction,  by estimation, is 60 to 65%. The left ventricle has normal function. The left ventricle has no regional wall motion abnormalities. There is moderate concentric left ventricular hypertrophy. Left ventricular diastolic parameters are consistent with Grade I diastolic dysfunction (impaired relaxation). Elevated left ventricular end-diastolic pressure.  2. Right ventricular systolic function is normal. The right ventricular size is normal.  3. The mitral valve is normal in structure. Trivial mitral valve regurgitation. No evidence of mitral stenosis. Moderate mitral annular calcification.  4. The aortic valve is tricuspid. There is mild calcification of the aortic valve. There is mild thickening of the aortic valve. Aortic valve regurgitation is not visualized. No aortic stenosis is present.  5. IVC not dilated. Unable to assess for respiratory collapse as patient is on a ventilator. FINDINGS  Left Ventricle: Left ventricular ejection fraction, by estimation, is 60 to 65%. The left ventricle has normal function. The left ventricle has no regional wall motion abnormalities. Global longitudinal strain performed but not reported based on interpreter judgement due to suboptimal tracking. The left ventricular internal cavity size was normal in size.  There is moderate concentric left ventricular hypertrophy. Left ventricular diastolic parameters are consistent with Grade I diastolic dysfunction (impaired relaxation). Elevated left ventricular end-diastolic pressure. Right Ventricle: The right ventricular size is normal. No increase in right ventricular wall thickness. Right ventricular systolic function is normal. Left Atrium: Left atrial size was normal in size. Right Atrium: Right atrial size was normal in size. Pericardium: There is no evidence of pericardial effusion. Mitral Valve: The mitral valve is normal in structure. Moderate mitral annular calcification. Trivial mitral valve regurgitation. No evidence of mitral  valve stenosis. Tricuspid Valve: The tricuspid valve is normal in structure. Tricuspid valve regurgitation is not demonstrated. No evidence of tricuspid stenosis. Aortic Valve: The aortic valve is tricuspid. There is mild calcification of the aortic valve. There is mild thickening of the aortic valve. Aortic valve regurgitation is not visualized. No aortic stenosis is present. Aortic valve mean gradient measures 6.0 mmHg. Aortic valve peak gradient measures 13.5 mmHg. Aortic valve area, by VTI measures 1.69 cm. Pulmonic Valve: The pulmonic valve was normal in structure. Pulmonic valve regurgitation is trivial. No evidence of pulmonic stenosis. Aorta: The aortic root is normal in size and structure. Venous: IVC not dilated. Unable to assess for respiratory collapse as patient is on a ventilator. IAS/Shunts: No atrial level shunt detected by color flow Doppler.  LEFT VENTRICLE PLAX 2D LVIDd:         2.90 cm  Diastology LVIDs:         2.30 cm  LV e' lateral:   3.81 cm/s LV PW:         1.60 cm  LV E/e' lateral: 23.0 LV IVS:        1.60 cm LVOT diam:     2.00 cm LV SV:         56 LV SV Index:   31 LVOT Area:     3.14 cm 3D Volume EF:                         3D EF:        54 %                         LV EDV:       98 ml                         LV ESV:       45 ml                         LV SV:        52 ml RIGHT VENTRICLE RV Basal diam:  4.20 cm RV Mid diam:    3.40 cm LEFT ATRIUM             Index       RIGHT ATRIUM           Index LA diam:        3.80 cm 2.09 cm/m  RA Area:     16.50 cm LA Vol (A2C):   36.2 ml 19.92 ml/m RA Volume:   44.40 ml  24.44 ml/m LA Vol (A4C):   28.6 ml 15.74 ml/m LA Biplane Vol: 32.2 ml 17.72 ml/m  AORTIC VALVE AV Area (Vmax):    1.62 cm AV Area (Vmean):   1.85 cm AV Area (VTI):     1.69 cm AV  Vmax:           184.00 cm/s AV Vmean:          118.000 cm/s AV VTI:            0.329 m AV Peak Grad:      13.5 mmHg AV Mean Grad:      6.0 mmHg LVOT Vmax:         94.60 cm/s LVOT Vmean:         69.500 cm/s LVOT VTI:          0.177 m LVOT/AV VTI ratio: 0.54  AORTA Ao Root diam: 3.10 cm Ao Asc diam:  3.20 cm MITRAL VALVE MV Area (PHT): 3.03 cm     SHUNTS MV Decel Time: 250 msec     Systemic VTI:  0.18 m MV E velocity: 87.80 cm/s   Systemic Diam: 2.00 cm MV A velocity: 139.00 cm/s MV E/A ratio:  0.63 Skeet Latch MD Electronically signed by Skeet Latch MD Signature Date/Time: 06/24/2020/4:32:03 PM    Final     Labs:  CBC: Recent Labs    06/22/20 2151 06/22/20 2233 06/23/20 0132 06/23/20 0517 06/24/20 0316 06/25/20 0352  WBC 29.3*  --   --  30.4* 22.6* 25.8*  HGB 8.5*   < > 8.8* 8.4* 8.3* 8.7*  HCT 26.5*   < > 26.0* 25.7* 24.1* 26.6*  PLT 194  --   --  272 289 311   < > = values in this interval not displayed.    COAGS: Recent Labs    12/05/19 0402  INR 1.1  APTT 31    BMP: Recent Labs    06/29/19 0340 10/29/19 1205 06/23/20 1605 06/24/20 0316 06/24/20 1520 06/25/20 0352  NA 137   < > 129* 132* 137 134*  K 3.4*   < > 3.7 4.7 3.4* 5.2*  CL 99   < > 93* 99 114* 102  CO2 26   < > 20* 23 17* 23  GLUCOSE 120*   < > 144* 109* 112* 166*  BUN 34*   < > 56* 40* 29* 34*  CALCIUM 9.4   < > 8.0* 7.9* 6.0* 8.2*  CREATININE 6.35*   < > 8.07* 5.29* 3.06* 3.20*  GFRNONAA 9*   < > 7* 11* 22* 21*  GFRAA 10*  --   --   --   --   --    < > = values in this interval not displayed.    LIVER FUNCTION TESTS: Recent Labs    06/22/20 2151 06/23/20 0517 06/23/20 1605 06/24/20 0316 06/24/20 1520 06/25/20 0352  BILITOT 1.0 1.9*  --  0.8  --  0.5  AST 275* 901*  --  207*  --  89*  ALT 138* 352*  --  240*  --  179*  ALKPHOS 107 168*  --  139*  --  163*  PROT 5.5* 5.9*  --  5.7*  --  6.2*  ALBUMIN 2.5* 2.6* 2.5* 2.4* 1.7* 2.6*    Assessment and Plan: 65 y.o. male with cholelithiasis as well as a positive Murphy sign concerning for acute cholecystitis; s/p perc choley drain placement with Dr. Pascal Lux on 6/21   Pt stable, drain intact, flushes and aspirates  well. Puncture site unremarkable, no s/s of bleeding or infection.  OP 30 cc - cloudy bile  VS afebrile, bouts of tachypnea - sedated and intubated  WBC 25.8 today (22.6 yesterday)  Continue with flushing TID, output recording q  shift and dressing changes as needed. Would consider additional imaging when output is less than 10 ml for 24 hours not including flush material.     Further treatment plan per TRH/ CCS/ PCCM/ [facility]  Appreciate and agree with the plan.  IR to follow.    Electronically Signed: Tera Mater, PA-C 06/25/2020, 9:49 AM   I spent a total of 15 Minutes at the the patient's bedside AND on the patient's hospital floor or unit, greater than 50% of which was counseling/coordinating care for perc choley drain placement

## 2020-06-25 NOTE — Progress Notes (Signed)
Subjective: No clinical seizures overnight.  Patient's brother [name] and nephew at bedside.  ROS: Unable to obtain due to poor mental status  Examination  Vital signs in last 24 hours: Temp:  [97.34 F (36.3 C)-98.96 F (37.2 C)] 98.78 F (37.1 C) (06/23 1100) Pulse Rate:  [73-99] 99 (06/23 1100) Resp:  [18-33] 25 (06/23 1100) BP: (119-182)/(65-75) 154/75 (06/23 1000) SpO2:  [98 %-100 %] 100 % (06/23 1125) Arterial Line BP: (122-176)/(54-67) 176/65 (06/23 1100) FiO2 (%):  [30 %-40 %] 30 % (06/23 1125) Weight:  [80.4 kg] 80.4 kg (06/23 0200)  General: lying in bed, not in apparent distress CVS: pulse-normal rate and rhythm RS: Intubated, CTAB  Extremities: Warm Neuro: Comatose, does not open eyes to noxious stimuli, PERRLA, corneal reflex absent, unable to elicit cough reflex, does not withdraw to noxious stimuli in all extremities  Basic Metabolic Panel: Recent Labs  Lab 06/23/20 0517 06/23/20 1605 06/24/20 0316 06/24/20 1227 06/24/20 1520 06/25/20 0352  NA 125* 129* 132*  --  137 134*  K 4.8 3.7 4.7  --  3.4* 5.2*  CL 89* 93* 99  --  114* 102  CO2 18* 20* 23  --  17* 23  GLUCOSE 359* 144* 109*  --  112* 166*  BUN 57* 56* 40*  --  29* 34*  CREATININE 9.01* 8.07* 5.29*  --  3.06* 3.20*  CALCIUM 7.6* 8.0* 7.9*  --  6.0* 8.2*  MG 2.3  --  2.3 2.4 1.8 2.7*  PHOS  --  4.5 3.8 4.1 2.8 5.0*    CBC: Recent Labs  Lab 06/21/2020 0720 06/21/20 0233 06/22/20 0103 06/22/20 2151 06/22/20 2233 06/23/20 0132 06/23/20 0517 06/24/20 0316 06/25/20 0352  WBC 10.1   < > 25.8* 29.3*  --   --  30.4* 22.6* 25.8*  NEUTROABS 8.7*  --   --  22.4*  --   --   --   --   --   HGB 10.4*   < > 8.6* 8.5* 8.5* 8.8* 8.4* 8.3* 8.7*  HCT 32.1*   < > 26.1* 26.5* 25.0* 26.0* 25.7* 24.1* 26.6*  MCV 94.4   < > 93.5 97.8  --   --  94.5 90.3 94.7  PLT 335   < > 283 194  --   --  272 289 311   < > = values in this interval not displayed.     Coagulation Studies: No results for input(s):  LABPROT, INR in the last 72 hours.  Imaging No new brain imaging overnight  ASSESSMENT AND PLAN: 65 year old male with multiple medical comorbidities as noted in HPI, had in-hospital cardiac arrest on 06/22/2020, 30 minutes for ROSC.  Was noted to have myoclonic seizures.    Anoxic/hypoxic brain injury Myoclonic seizures ( resolved) Cardiac arrest Left subdural hematoma, chronic Right extra-axial hematoma, chronic -No clinical seizures overnight.  Recommendations: -Continue Keppra 1000 mg twice daily, Depakote 250 mg 3 times daily.  Depakote level subtherapeutic at 17, can increase if patient has any further seizures -Has been off propofol for neuro prognostication.  Okay to use prn Versed for clinical seizures -MRI brain ordered and pending to assess for anoxic/hypoxic brain injury -Had a long discussion with patient's brother [name].  I explained that given patient's history, neurologic exam, EEG and CT head, I suspect patient has suffered significant neurologic injury with minimal to no chances of meaningful recovery.  Patient's brother again  expressed distrust towards [facility] medical system.  He states patient would have wanted  everything done including trach and PEG and would like to remain full code.  He also stated he would preferred to get patient transferred to different hospital.  I have notified critical care team of my discussion with patient's brother and his desire to transfer patient -Also, patient's brother has repeatedly brought up his lack of trust with every employee at [facility].  I discussed with brother as well as critical care team that it might be beneficial to have a family meeting with all the doctors so the family can get a better understanding of patient's overall recovery and prognosis.  -Continue seizure precautions -Management of rest of comorbidities per primary team  Thank you for allowing [place] to participate in the care of this patient.  Neurology will  follow peripherally.  Please call us if you have any further questions  I have spent a total of  45  minutes with the patient reviewing hospital notes,  test results, labs and examining the patient as well as establishing an assessment and plan that was discussed personally with the patient's brother [name], nurse Shaun and Shirlee Limerick NP.  > 50% of time was spent in direct patient care.    Zeb [facility] For questions after 5pm please refer to AMION to reach the Neurologist on call

## 2020-06-25 NOTE — Progress Notes (Signed)
CCM family communication note  After speaking with Dr. Hortense Ramal regarding her conversation with pt family, Daniel Thomas, regarding neurologic updates Dr. Hortense Ramal shared that Daniel Thomas had repeatedly referenced desire to pursue transfer to OSH, and distrust with [facility] system. Confirmed by RN Shaun who was present for meeting.   I called Daniel Thomas to inquire if transfer to OSH is desired as there have not been conversations between CCM and family regarding family request for possible transfer since pt transfer to ICU.   Daniel Thomas states he did talk about that with Dr. Hortense Ramal however he is not asking for possible transfer at this time. He wishes to speak with his family before potentially requesting exploration of pt transfer to OSH.   Will continue current care efforts. No transfer efforts to be pursued at this time.    Daniel Gum MSN, AGACNP-BC [place] for pager  06/25/2020, 2:04 PM

## 2020-06-25 NOTE — Progress Notes (Signed)
NAME:  Daniel Thomas, MRN:  [id number], DOB:  [birth year], LOS: 5 ADMISSION DATE:  06/21/2020, CONSULTATION DATE:  6/20 REFERRING MD:  Dr. Thereasa Solo, CHIEF COMPLAINT:  N/V; Post Cardiac Arrest   History of Present Illness:  Patient is a 65 yo M with pertinent PMH of COPD, ESRD on dialysis, DM, Heart murmur, HTN, and hyperlipidemia presenting to [facility] on 6/18 with N/V.  Patient arrived to [facility] on 6/18 stating his N/V began yesterday (6/17) during the night. He has not missed any dialysis treatments except for today. Pertinent ED labs show Creatinine of 9.6, Lipase 108. Nephrology consulted for ESRD. RUQ ultrasound show cholithiasis and patient has positive Murphy's sign. Surgery consulted. Patient declined surgery and perc tube placement at this time.   On 6/20, patient underwent CT abdomen with IV/oral contrast to evaluate gallbladder. While in CT patient lost pulse and cardiac arrested for 30 minutes before ROSC. Patient tongue appeared swollen and required intubation with a 6.5 ET tube due to the swelling. Epi, steroids, calcium, mag, and bicarb were given. One shock was given during one intermission with V-fib on rhythm check.   PCCM consulted on 6/20 for post cardiac arrest care and mechanical ventilator/airway management.  Pertinent  Medical History   Past Medical History:  Diagnosis Date   COPD (chronic obstructive pulmonary disease) ([place])    Diabetes mellitus without complication ([place])    Enlarged prostate    ESRD on dialysis ([place])    Heart murmur    High cholesterol    Hypertension     Significant Hospital Events: Including procedures, antibiotic start and stop dates in addition to other pertinent events   6/18: admitted to [facility] with symptomatic cholelithiasis 6/20: CT abdomen with oral contrast ordered; patient cardiac arrested x30 minutes until ROSC and appeared anaphylactic from contrast with swollen tongue. Intubated with 6.5 ETT. Admitted to ICU. Patient went bradycardic and  cardiac arrested again and given 1 atropine, 1 epi, and bicarb given; ROSC after 2 mintues. Myoclonic activity: EEG pending. 6/21: Initiated on CRRT.  Percutaneous cholecystostomy 6/22 CT H  with signs of anoxic/hypoxic injury  6/23 Family discussed neuro prognostication with family. CCS plan to s/o   Interim History / Subjective:  Off of propofol for neuro exam   Objective   Blood pressure (!) 154/68, pulse 93, temperature 98.06 F (36.7 C), resp. rate (!) 22, height 5\' 1"  (1.549 m), weight 80.4 kg, SpO2 100 %.    Vent Mode: PRVC FiO2 (%):  [30 %-40 %] 30 % Set Rate:  [20 bmp] 20 bmp Vt Set:  [420 mL] 420 mL PEEP:  [5 cmH20] 5 cmH20 Plateau Pressure:  [21 cmH20-29 cmH20] 23 cmH20   Intake/Output Summary (Last 24 hours) at 06/25/2020 0832 Last data filed at 06/25/2020 0800 Gross per 24 hour  Intake 2553.46 ml  Output 4228 ml  Net -1674.54 ml   Filed Weights   06/23/20 1345 06/24/20 0500 06/25/20 0200  Weight: 84.2 kg 82.8 kg 80.4 kg    Examination: General: Chronically and critically ill M, intubated, off sedation NAD  Neuro:  No response to painful stimuli. Does initiate respirations. No cough/gag. No corneal reflex  HEENT: NCAT pink mm ETT secure anicteric sclera  Cardiovascular: rrr s1s2 cap refill < 3 seconds  Lungs: Symmetrical chest expansion. Abdominal breathing. O Abdomen: Per chole tube C/D/I with minimal bilious output.  BS x 4, soft, NT/ND.  Musculoskeletal: No gross deformities, no edema.  Skin: Intact, warm, no rashes.   [facility]  Problem list     Assessment & Plan:   Anoxic Encephalopathy  Subcortical myoclonus  -- now burst suppression pattern -CT head with signs of loss of gray white differentiation  which is concerning for anoxic brain injury in setting of cardiac arrest -EEG also suggestive of anoxic injury  P -unfortunately, poor prognosis  -I communicated these findings and clinical significance 6/23, as did neurology  -off prop  -cont  AEDs  -MRI brain when able.   S/p Cardiac arrest with ROSC  Possible anaphylactic shock (following oral contrast) -- improved  -6/22 ECHO with LVEF 19-14%, Grade I diastolic dysfunction, normal RV systolic function.  P  - Supportive care - off pressors -steroid, benadryl, pepcid   Acute respiratory failure with hypoxia  Hx COPD  -resp failure due to suspected anaphylaxis-mediated cardiac arrest  P -cont full MV support -mentation precludes weaning vent -- does initiate respirations but pattern atypical  -CXR PRN  -BDs   Sepsis due to acute cholecystitis s/p perc cholecystostomy with IR 6/21 P -drain per IR -cont cefepime, flagyl  -CCS sign off   ESRD Hyperkalemia, mild P -CRRT per nephrology  -BP has improved, plans to stop CRRT 6/24 and start iHD 6/25 pending hemodynamic stability -K correction via CRRT   HTN P -starting home coreg 12.5mg  BID 6/23  -PRN hydral -if continues to require PRN hydral with addition of coreg,   Anemia of chronic disease  - hgb goal >7   Dm2 - SSI   Inadequate PO intake P -RDN consult   Goals of Care -Family wishes for ongoing aggressive care. [name] at bedside 6/23 -- shares story of his mother who survived a stroke despite a reportedly poor prognosis. -Additionally shares belief that current medical team is only concerned with making money and that we are pushing the patient to the "next level of care" and out of the ICU for financial gain ([name] references several conversations about quality of life following anoxic injury, but there is no reference to a conversation where finances were ever referenced nor where progressing pt to LTACH/SNF discussed),  whereas the family cares about the patient -I very clearly stated that we care about the patient, and that our aim in discussing quality of life is to keep the patient at the center of our medical interventions.  -Struggling to establish meaningful trust with family. Will continue to  provide transparent updates and collaboratively provide patient-centered care  -will hold off on palliative consult for now, think it best to await MRI and further discussions with primary team      Best Practice (right click and "Reselect all SmartList Selections" daily)   Diet/type: NPO Pain/Anxiety/Delirium protocol RASS goal: 0 -- propofol off for neurologic exam  VAP protocol (if indicated): Yes DVT prophylaxis: LMWH GI prophylaxis: H2B Glucose control:  SSI Central venous access:  Yes, and it is still needed Arterial line:  Yes, and it is still needed Foley:  N/A Mobility:  bed rest  PT consulted: N/A Culture data pending:none Last reviewed culture data:today Antibiotics:cefepime and flagyl  Daily labs: ordered Code Status:  full code Last date of multidisciplinary goals of care discussion 6/23  Disposition: remains critically ill, will stay in intensive care   CRITICAL CARE Performed by: Cristal Generous   Total critical care time:  64 minutes  Critical care time was exclusive of separately billable procedures and treating other patients. Critical care was necessary to treat or prevent imminent or life-threatening deterioration.  Critical care was  time spent personally by me on the following activities: development of treatment plan with patient and/or surrogate as well as nursing, discussions with consultants, evaluation of patient's response to treatment, examination of patient, obtaining history from patient or surrogate, ordering and performing treatments and interventions, ordering and review of laboratory studies, ordering and review of radiographic studies, pulse oximetry and re-evaluation of patient's condition.  Eliseo Gum MSN, AGACNP-BC [place] for pager  06/25/2020, 12:16 PM

## 2020-06-26 ENCOUNTER — Inpatient Hospital Stay (HOSPITAL_COMMUNITY): Payer: Medicare Other

## 2020-06-26 DIAGNOSIS — K851 Biliary acute pancreatitis without necrosis or infection: Secondary | ICD-10-CM | POA: Diagnosis not present

## 2020-06-26 LAB — COMPREHENSIVE METABOLIC PANEL
ALT: 122 U/L — ABNORMAL HIGH (ref 0–44)
AST: 64 U/L — ABNORMAL HIGH (ref 15–41)
Albumin: 2.5 g/dL — ABNORMAL LOW (ref 3.5–5.0)
Alkaline Phosphatase: 201 U/L — ABNORMAL HIGH (ref 38–126)
Anion gap: 10 (ref 5–15)
BUN: 40 mg/dL — ABNORMAL HIGH (ref 8–23)
CO2: 23 mmol/L (ref 22–32)
Calcium: 8.3 mg/dL — ABNORMAL LOW (ref 8.9–10.3)
Chloride: 101 mmol/L (ref 98–111)
Creatinine, Ser: 2.27 mg/dL — ABNORMAL HIGH (ref 0.61–1.24)
GFR, Estimated: 31 mL/min — ABNORMAL LOW (ref >60)
Glucose, Bld: 269 mg/dL — ABNORMAL HIGH (ref 70–99)
Potassium: 3.9 mmol/L (ref 3.5–5.1)
Sodium: 134 mmol/L — ABNORMAL LOW (ref 135–145)
Total Bilirubin: 0.8 mg/dL (ref 0.3–1.2)
Total Protein: 6 g/dL — ABNORMAL LOW (ref 6.5–8.1)

## 2020-06-26 LAB — GLUCOSE, CAPILLARY
Glucose-Capillary: 247 mg/dL — ABNORMAL HIGH (ref 70–99)
Glucose-Capillary: 224 mg/dL — ABNORMAL HIGH (ref 70–99)
Glucose-Capillary: 251 mg/dL — ABNORMAL HIGH (ref 70–99)
Glucose-Capillary: 337 mg/dL — ABNORMAL HIGH (ref 70–99)

## 2020-06-26 LAB — RENAL FUNCTION PANEL
Albumin: 2.4 g/dL — ABNORMAL LOW (ref 3.5–5.0)
Anion gap: 13 (ref 5–15)
BUN: 58 mg/dL — ABNORMAL HIGH (ref 8–23)
CO2: 22 mmol/L (ref 22–32)
Calcium: 8.3 mg/dL — ABNORMAL LOW (ref 8.9–10.3)
Chloride: 101 mmol/L (ref 98–111)
Creatinine, Ser: 2.98 mg/dL — ABNORMAL HIGH (ref 0.61–1.24)
GFR, Estimated: 23 mL/min — ABNORMAL LOW (ref >60)
Glucose, Bld: 391 mg/dL — ABNORMAL HIGH (ref 70–99)
Phosphorus: 4.2 mg/dL (ref 2.5–4.6)
Potassium: 4.1 mmol/L (ref 3.5–5.1)
Sodium: 136 mmol/L (ref 135–145)

## 2020-06-26 LAB — POCT I-STAT 7, (LYTES, BLD GAS, ICA,H+H)
Acid-Base Excess: 4 mmol/L — ABNORMAL HIGH (ref 0.0–2.0)
Acid-Base Excess: 3 mmol/L — ABNORMAL HIGH (ref 0.0–2.0)
Bicarbonate: 25.5 mmol/L (ref 20.0–28.0)
Bicarbonate: 26.2 mmol/L (ref 20.0–28.0)
Calcium, Ion: 1.1 mmol/L — ABNORMAL LOW (ref 1.15–1.40)
Calcium, Ion: 1.12 mmol/L — ABNORMAL LOW (ref 1.15–1.40)
HCT: 28 % — ABNORMAL LOW (ref 39.0–52.0)
HCT: 28 % — ABNORMAL LOW (ref 39.0–52.0)
Hemoglobin: 9.5 g/dL — ABNORMAL LOW (ref 13.0–17.0)
Hemoglobin: 9.5 g/dL — ABNORMAL LOW (ref 13.0–17.0)
O2 Saturation: 99 %
O2 Saturation: 99 %
Patient temperature: 36.2
Patient temperature: 98.6
Potassium: 3.9 mmol/L (ref 3.5–5.1)
Potassium: 4 mmol/L (ref 3.5–5.1)
Sodium: 135 mmol/L (ref 135–145)
Sodium: 137 mmol/L (ref 135–145)
TCO2: 26 mmol/L (ref 22–32)
TCO2: 27 mmol/L (ref 22–32)
pCO2 arterial: 25.8 mmHg — ABNORMAL LOW (ref 32.0–48.0)
pCO2 arterial: 33.8 mmHg (ref 32.0–48.0)
pH, Arterial: 7.599 — ABNORMAL HIGH (ref 7.350–7.450)
pH, Arterial: 7.498 — ABNORMAL HIGH (ref 7.350–7.450)
pO2, Arterial: 126 mmHg — ABNORMAL HIGH (ref 83.0–108.0)
pO2, Arterial: 126 mmHg — ABNORMAL HIGH (ref 83.0–108.0)

## 2020-06-26 LAB — CBC
HCT: 27.6 % — ABNORMAL LOW (ref 39.0–52.0)
Hemoglobin: 9.2 g/dL — ABNORMAL LOW (ref 13.0–17.0)
MCH: 31 pg (ref 26.0–34.0)
MCHC: 33.3 g/dL (ref 30.0–36.0)
MCV: 92.9 fL (ref 80.0–100.0)
Platelets: 297 10*3/uL (ref 150–400)
RBC: 2.97 MIL/uL — ABNORMAL LOW (ref 4.22–5.81)
RDW: 15.3 % (ref 11.5–15.5)
WBC: 32.5 10*3/uL — ABNORMAL HIGH (ref 4.0–10.5)
nRBC: 0.3 % — ABNORMAL HIGH (ref 0.0–0.2)

## 2020-06-26 LAB — TRIGLYCERIDES: Triglycerides: 231 mg/dL — ABNORMAL HIGH (ref <150)

## 2020-06-26 LAB — PHOSPHORUS: Phosphorus: 3.1 mg/dL (ref 2.5–4.6)

## 2020-06-26 LAB — MAGNESIUM: Magnesium: 2.6 mg/dL — ABNORMAL HIGH (ref 1.7–2.4)

## 2020-06-26 MED ORDER — INSULIN REGULAR(HUMAN) IN NACL 100-0.9 UT/100ML-% IV SOLN
INTRAVENOUS | Status: DC
  Administered 2020-06-26: 9.5 [IU]/h via INTRAVENOUS
  Filled 2020-06-26: qty 100

## 2020-06-26 MED ORDER — HYDRALAZINE HCL 50 MG PO TABS
50.0000 mg | ORAL_TABLET | Freq: Three times a day (TID) | ORAL | Status: DC
  Administered 2020-06-26 – 2020-07-02 (×11): 50 mg
  Filled 2020-06-26 (×13): qty 1

## 2020-06-26 MED ORDER — SODIUM CHLORIDE 0.9 % IV SOLN
2.0000 g | INTRAVENOUS | Status: DC
  Administered 2020-06-27 – 2020-06-29 (×3): 2 g via INTRAVENOUS
  Filled 2020-06-26 (×3): qty 2

## 2020-06-26 MED ORDER — AMLODIPINE BESYLATE 10 MG PO TABS
10.0000 mg | ORAL_TABLET | Freq: Every day | ORAL | Status: DC
  Administered 2020-06-26 – 2020-07-02 (×6): 10 mg
  Filled 2020-06-26 (×6): qty 1

## 2020-06-26 MED ORDER — DEXTROSE 50 % IV SOLN
0.0000 mL | INTRAVENOUS | Status: DC | PRN
  Administered 2020-06-28: 50 mL via INTRAVENOUS
  Filled 2020-06-26: qty 50

## 2020-06-26 MED ORDER — SCOPOLAMINE 1 MG/3DAYS TD PT72
1.0000 | MEDICATED_PATCH | TRANSDERMAL | Status: DC
  Administered 2020-06-26 – 2020-07-02 (×3): 1.5 mg via TRANSDERMAL
  Filled 2020-06-26 (×3): qty 1

## 2020-06-26 MED ORDER — CHLORHEXIDINE GLUCONATE CLOTH 2 % EX PADS
6.0000 | MEDICATED_PAD | Freq: Every day | CUTANEOUS | Status: DC
  Administered 2020-06-27: 6 via TOPICAL

## 2020-06-26 NOTE — Progress Notes (Signed)
[facility] Progress Note   Subjective:    Stable on CRRT, not clotting-  negative 1200 overnight  Off pressors, in  fact hypertensive-  neurology has seen -  unfortunately seems like poor neuro prognosis AM labs K 4.0 and P 3.1 Events noted-  family upset-  looking to pursue trach/PEG and continue with all care   Objective Vitals:   06/26/20 0527 06/26/20 0530 06/26/20 0600 06/26/20 0700  BP: (!) 179/63  126/71   Pulse:  91 86 92  Resp:  _0 Temp:  97.9 F (36.6 C) 97.9 F (36.6 C) 98.4 F (36.9 C)  TempSrc:      SpO2:  100% 99% 100%  Weight:      Height:       Physical Exam General: intubated, sedated Heart: Regular Lungs: Coarse bs b/l Abdomen: Round, distended but soft, active bowel sounds Extremities: min edema bilateral lower extremities Dialysis Access:  [facility] R chest  NEURO: encephalopathic  Filed Weights   06/24/20 0500 06/25/20 0200 06/26/20 0400  Weight: 82.8 kg 80.4 kg 76.7 kg    Intake/Output Summary (Last 24 hours) at 06/26/2020 0731 Last data filed at 06/26/2020 0700 Gross per 24 hour  Intake 2352.05 ml  Output 3554 ml  Net -1201.95 ml    Additional Objective Labs: Basic Metabolic Panel: Recent Labs  Lab 06/25/20 0352 06/25/20 1712 06/26/20 0334 06/26/20 0336 06/26/20 0621  NA 134* 133* 134* 135 137  K 5.2* 4.4 3.9 3.9 4.0  CL 102 104 101  --   --   CO2 _1 --   --   GLUCOSE 166* 217* 269*  --   --   BUN 34* 37* 40*  --   --   CREATININE 3.20* 2.67* 2.27*  --   --   CALCIUM 8.2* 7.6* 8.3*  --   --   PHOS 5.0* 4.0  3.9 3.1  --   --    Liver Function Tests: Recent Labs  Lab 06/24/20 0316 06/24/20 1520 06/25/20 0352 06/25/20 1712 06/26/20 0334  AST 207*  --  89*  --  64*  ALT 240*  --  179*  --  122*  ALKPHOS 139*  --  163*  --  201*  BILITOT 0.8  --  0.5  --  0.8  PROT 5.7*  --  6.2*  --  6.0*  ALBUMIN 2.4*   < > 2.6* 2.6* 2.5*   < > = values in this interval not displayed.   Recent Labs  Lab  06/11/2020 0720 06/21/20 0233 06/22/20 0103  LIPASE 108* 87* 40   CBC: Recent Labs  Lab 06/17/2020 0720 06/21/20 0233 06/22/20 2151 06/22/20 2233 06/23/20 0517 06/24/20 0316 06/25/20 0352 06/26/20 0334 06/26/20 0336 06/26/20 0621  WBC 10.1   < > 29.3*  --  30.4* 22.6* 25.8* 32.5*  --   --   NEUTROABS 8.7*  --  22.4*  --   --   --   --   --   --   --   HGB 10.4*   < > 8.5*   < > 8.4* 8.3* 8.7* 9.2* 9.5* 9.5*  HCT 32.1*   < > 26.5*   < > 25.7* 24.1* 26.6* 27.6* 28.0* 28.0*  MCV 94.4   < > 97.8  --  94.5 90.3 94.7 92.9  --   --   PLT 335   < > 194  --  272 289 311 297  --   --    < > =  values in this interval not displayed.   Blood Culture    Component Value Date/Time   SDES BLOOD RIGHT ANTECUBITAL 12/09/2019 0842   SPECREQUEST  12/09/2019 0842    BOTTLES DRAWN AEROBIC AND ANAEROBIC Blood Culture adequate volume   CULT  12/09/2019 0842    NO GROWTH 5 DAYS Performed at [facility], [address] [place], [place] [address]    REPTSTATUS 12/14/2019 FINAL 12/09/2019 5277    Cardiac Enzymes: No results for input(s): CKTOTAL, CKMB, CKMBINDEX, TROPONINI in the last 168 hours. CBG: Recent Labs  Lab 06/25/20 1109 06/25/20 1608 06/25/20 2035 06/25/20 2347 06/26/20 0332  GLUCAP 192* 218* 211* 125* 247*   Iron Studies: No results for input(s): IRON, TIBC, TRANSFERRIN, FERRITIN in the last 72 hours. Lab Results  Component Value Date   INR 1.1 12/05/2019   INR 1.1 05/12/2019   Studies/Results: CT HEAD WO CONTRAST  Result Date: 06/24/2020 CLINICAL DATA:  Status post cardiac arrest. EXAM: CT HEAD WITHOUT CONTRAST TECHNIQUE: Contiguous axial images were obtained from the base of the skull through the vertex without intravenous contrast. COMPARISON:  April 29, 2020. FINDINGS: Brain: Decreased size of lenticular extra-axial collection along the high RIGHT posterior frontal and parietal convexity with mixed attenuation material layering dependently. No midline shift. No  intraparenchymal hemorrhage. Tiny extra-axial collection, small subdural may be present along the LEFT convexity as well better seen on coronal images decreased in conspicuity compared to the previous exam no hydrocephalus. RIGHT sided extra-axial collection in coronal plane measuring 4.8 x 1.0 cm in greatest dimension as compared to 5.8 x 1.8 cm on the prior study. Less mass effect upon adjacent sulci (image 51/5) Very subtle area of extra-axial material on image 31 of series 5 May correspond to previous presumed extra-axial collection/small subdural. Diminished conspicuity of gray-white differentiation may be present. Vascular: No hyperdense vessel or unexpected calcification. Skull: Normal. Negative for fracture or focal lesion. Sinuses/Orbits: Scattered ethmoid opacification and signs of sphenoid sinus disease. Other: None IMPRESSION: 1. Loss of gray-white differentiation subtle in most areas but more pronounced particularly in the basal ganglia on the RIGHT compatible with early changes related to hypoxic/ischemic injury. No herniation or significant changes of edema at this time. 2. Decreased size of mixed density extra-axial collection along the RIGHT posterior frontal parietal region with decreased mass effect. 3. Tiny extra-axial collection, small subdural may be present along the LEFT convexity as well better seen on coronal images decreased in conspicuity compared to the previous exam. 4. Potential diminished gray-white differentiation. 5. No midline shift. 6. Scattered ethmoid opacification and signs of sphenoid sinus disease. 7. Critical Value/emergent results were called by telephone at the time of interpretation on 06/24/2020 at 11:31 am to provider [facility] , who verbally acknowledged these results. Electronically Signed   By: Zetta Bills M.D.   On: 06/24/2020 10:58   DG CHEST PORT 1 VIEW  Result Date: 06/26/2020 CLINICAL DATA:  Respiratory failure EXAM: PORTABLE CHEST 1 VIEW COMPARISON:   06/24/2020 FINDINGS: Endotracheal tube is been withdrawn, now 6.1 cm above the carina. Nasogastric tube extends into the upper abdomen beyond the margin of the examination. Right internal jugular hemodialysis catheter tip noted at the superior cavoatrial junction. The lungs are symmetrically well expanded. Focal opacity within the right infrahilar region may represent right middle lobe atelectasis or infiltrate. The lungs are otherwise clear. No pneumothorax or pleural effusion. Cardiac size within normal limits. Pulmonary vascularity is normal. IMPRESSION: Support lines and tubes in appropriate position. Improved  pulmonary insufflation. Focal opacity within the right infrahilar region, atelectasis versus infiltrate. Electronically Signed   By: Fidela Salisbury MD   On: 06/26/2020 02:10   ECHOCARDIOGRAM COMPLETE  Result Date: 06/24/2020    ECHOCARDIOGRAM REPORT   Patient Name:   Daniel Thomas Date of Exam: 06/24/2020 Medical Rec #:  [id number]      Height:       61.0 in Accession #:    [id number]     Weight:       182.5 lb Date of Birth:  [birth year]      BSA:          1.817 m Patient Age:    65 years       BP:           149/68 mmHg Patient Gender: M              HR:           80 bpm. Exam Location:  Inpatient Procedure: 2D Echo, Cardiac Doppler, Color Doppler, Strain Analysis and 3D Echo Indications:    Cardiac Arrest I46.9  History:        Patient has prior history of Echocardiogram examinations, most                 recent 12/18/2019. Signs/Symptoms:Shortness of Breath; Risk                 Factors:Hypertension.  Sonographer:    Merrie Roof RDCS Referring Phys: [id number] RAHUL P DESAI IMPRESSIONS  1. Left ventricular ejection fraction, by estimation, is 60 to 65%. The left ventricle has normal function. The left ventricle has no regional wall motion abnormalities. There is moderate concentric left ventricular hypertrophy. Left ventricular diastolic parameters are consistent with Grade I diastolic dysfunction  (impaired relaxation). Elevated left ventricular end-diastolic pressure.  2. Right ventricular systolic function is normal. The right ventricular size is normal.  3. The mitral valve is normal in structure. Trivial mitral valve regurgitation. No evidence of mitral stenosis. Moderate mitral annular calcification.  4. The aortic valve is tricuspid. There is mild calcification of the aortic valve. There is mild thickening of the aortic valve. Aortic valve regurgitation is not visualized. No aortic stenosis is present.  5. IVC not dilated. Unable to assess for respiratory collapse as patient is on a ventilator. FINDINGS  Left Ventricle: Left ventricular ejection fraction, by estimation, is 60 to 65%. The left ventricle has normal function. The left ventricle has no regional wall motion abnormalities. Global longitudinal strain performed but not reported based on interpreter judgement due to suboptimal tracking. The left ventricular internal cavity size was normal in size. There is moderate concentric left ventricular hypertrophy. Left ventricular diastolic parameters are consistent with Grade I diastolic dysfunction (impaired relaxation). Elevated left ventricular end-diastolic pressure. Right Ventricle: The right ventricular size is normal. No increase in right ventricular wall thickness. Right ventricular systolic function is normal. Left Atrium: Left atrial size was normal in size. Right Atrium: Right atrial size was normal in size. Pericardium: There is no evidence of pericardial effusion. Mitral Valve: The mitral valve is normal in structure. Moderate mitral annular calcification. Trivial mitral valve regurgitation. No evidence of mitral valve stenosis. Tricuspid Valve: The tricuspid valve is normal in structure. Tricuspid valve regurgitation is not demonstrated. No evidence of tricuspid stenosis. Aortic Valve: The aortic valve is tricuspid. There is mild calcification of the aortic valve. There is mild thickening  of the aortic valve. Aortic valve regurgitation is not visualized.  No aortic stenosis is present. Aortic valve mean gradient measures 6.0 mmHg. Aortic valve peak gradient measures 13.5 mmHg. Aortic valve area, by VTI measures 1.69 cm. Pulmonic Valve: The pulmonic valve was normal in structure. Pulmonic valve regurgitation is trivial. No evidence of pulmonic stenosis. Aorta: The aortic root is normal in size and structure. Venous: IVC not dilated. Unable to assess for respiratory collapse as patient is on a ventilator. IAS/Shunts: No atrial level shunt detected by color flow Doppler.  LEFT VENTRICLE PLAX 2D LVIDd:         2.90 cm  Diastology LVIDs:         2.30 cm  LV e' lateral:   3.81 cm/s LV PW:         1.60 cm  LV E/e' lateral: 23.0 LV IVS:        1.60 cm LVOT diam:     2.00 cm LV SV:         56 LV SV Index:   31 LVOT Area:     3.14 cm 3D Volume EF:                         3D EF:        54 %                         LV EDV:       98 ml                         LV ESV:       45 ml                         LV SV:        52 ml RIGHT VENTRICLE RV Basal diam:  4.20 cm RV Mid diam:    3.40 cm LEFT ATRIUM             Index       RIGHT ATRIUM           Index LA diam:        3.80 cm 2.09 cm/m  RA Area:     16.50 cm LA Vol (A2C):   36.2 ml 19.92 ml/m RA Volume:   44.40 ml  24.44 ml/m LA Vol (A4C):   28.6 ml 15.74 ml/m LA Biplane Vol: 32.2 ml 17.72 ml/m  AORTIC VALVE AV Area (Vmax):    1.62 cm AV Area (Vmean):   1.85 cm AV Area (VTI):     1.69 cm AV Vmax:           184.00 cm/s AV Vmean:          118.000 cm/s AV VTI:            0.329 m AV Peak Grad:      13.5 mmHg AV Mean Grad:      6.0 mmHg LVOT Vmax:         94.60 cm/s LVOT Vmean:        69.500 cm/s LVOT VTI:          0.177 m LVOT/AV VTI ratio: 0.54  AORTA Ao Root diam: 3.10 cm Ao Asc diam:  3.20 cm MITRAL VALVE MV Area (PHT): 3.03 cm     SHUNTS MV Decel Time: 250 msec     Systemic VTI:  0.18 m MV E velocity: 87.80 cm/s  Systemic Diam: 2.00 cm MV A velocity:  139.00 cm/s MV E/A ratio:  0.63 Skeet Latch MD Electronically signed by Skeet Latch MD Signature Date/Time: 06/24/2020/4:32:03 PM    Final     Medications:   prismasol BGK 4/2.5 500 mL/hr at 06/25/20 2015    [facility] [place] 4/2.5 300 mL/hr at 06/25/20 2014   ceFEPime (MAXIPIME) IV Stopped (06/26/20 0555)   famotidine (PEPCID) IV Stopped (06/25/20 2306)   levETIRAcetam Stopped (06/26/20 0225)   metronidazole Stopped (06/26/20 0308)   potassium PHOSPHATE IVPB (in mmol) Stopped (06/24/20 2342)   prismasol BGK 2/2.5 dialysis solution 2,000 mL/hr at 06/26/20 0512   propofol (DIPRIVAN) infusion Stopped (06/25/20 0619)    artificial tears  1 application Both Eyes I0X   B-complex with vitamin C  1 tablet Per Tube Daily   calcitRIOL  0.25 mcg Per Tube Once per day on Tue Thu Sat   carvedilol  12.5 mg Per Tube BID   chlorhexidine gluconate (MEDLINE KIT)  15 mL Mouth Rinse BID   Chlorhexidine Gluconate Cloth  6 each Topical Daily   darbepoetin (ARANESP) injection - DIALYSIS  60 mcg Subcutaneous Weekly   diphenhydrAMINE  25 mg Intravenous Q6H   docusate  100 mg Per Tube BID   feeding supplement (PROSource TF)  90 mL Per Tube TID   feeding supplement (VITAL 1.5 CAL)  1,000 mL Per Tube Q24H   heparin injection (subcutaneous)  5,000 Units Subcutaneous Q8H   hydrALAZINE  25 mg Per Tube Q8H   insulin aspart  1-3 Units Subcutaneous Q4H   insulin detemir  5 Units Subcutaneous Q12H   mouth rinse  15 mL Mouth Rinse 10 times per day   methylPREDNISolone (SOLU-MEDROL) injection  60 mg Intravenous Q12H   mupirocin ointment  1 application Nasal BID   polyethylene glycol  17 g Per Tube Daily   sodium chloride flush  10-40 mL Intracatheter Q12H   sodium chloride flush  5 mL Intracatheter Q8H   valproic acid  250 mg Per Tube TID    Dialysis Orders: TTS [place] [facility])  4h 31mn   70.5kg  400/500   2/2.25 bath  TDC  Hep 1000  - calc 0.266m PO q HD  - mircera 50 q4 last  5/31  - venofer 50 /wk  - sensipar 12030mO q HD  Assessment/Plan: S/p cardiac arrest, prolonged time until ROSC 06/22/20.   Like severe anoxic brain injury, neuro following Acute cholecysttis, CCS following; s/p perc drain 6/21; cefepime flagyl ESRD: R IJ TDC. [place] and do IHD on Sat,  some UF.  Not on heparin or citrate at this time.  P ok.   Will talk with brother before doing this-   Hypotension post #1 now off pressors and with compensatory hypertension Anemia: Hgb stable, even improved - no meds  CKD-BMD Ca ok,   Mild/mod hyponatremia: improving with CRRT.    KelLouis MeckelD  [facility] 06/26/2020,7:31 AM  LOS: 6 days

## 2020-06-26 NOTE — Progress Notes (Addendum)
Referring Physician(s): Wilmer Floor PA  Supervising Physician: Arne Cleveland  Patient Status:  [facility] - In-pt  Chief Complaint:  Symptomatic cholelithiasis with possible acute cholecystitis s/p cholecystomy tube on 6.21.22 by Dr Owens Shark  Subjective:   Unable to assess. Patient intubated and sedated. Sister in law and another family member present.  Allergies: Contrast media [iodinated diagnostic agents] and Tape  Medications: Prior to Admission medications   Medication Sig Start Date End Date Taking? Authorizing Provider  albuterol (VENTOLIN HFA) 108 (90 Base) MCG/ACT inhaler Inhale 2 puffs into the lungs every 6 (six) hours as needed for wheezing or shortness of breath.   Yes [provider]  amLODipine (NORVASC) 10 MG tablet Take 1 tablet (10 mg total) by mouth at bedtime. 12/05/19  Yes Wieting, Richard, MD  atorvastatin (LIPITOR) 40 MG tablet Take 40 mg by mouth at bedtime.   Yes [provider]  calcium carbonate (TUMS - DOSED IN MG ELEMENTAL CALCIUM) 500 MG chewable tablet Chew 1 tablet by mouth every 6 (six) hours as needed for indigestion.   Yes [provider]  carvedilol (COREG) 12.5 MG tablet Take 12.5 mg by mouth 2 (two) times daily with a meal. 03/31/18  Yes [provider]  citalopram (CELEXA) 20 MG tablet Take 20 mg by mouth daily.   Yes [provider]  ergocalciferol (VITAMIN D2) 1.25 MG (50000 UT) capsule Take by mouth. Administered at analysis 02/02/18  Yes [provider]  ferric citrate (AURYXIA) 1 GM 210 MG(Fe) tablet Take by mouth.  04/29/19  Yes [provider]  fluticasone (FLONASE) 50 MCG/ACT nasal spray Place 1 spray into both nostrils daily. 04/27/20  Yes [provider]  hydrALAZINE (APRESOLINE) 25 MG tablet Take 1 tablet (25 mg total) by mouth every 8 (eight) hours. 05/17/19  Yes Nolberto Hanlon, MD  linagliptin (TRADJENTA) 5 MG TABS tablet Take 5 mg by mouth daily.   Yes [provider]  sevelamer carbonate (RENVELA) 800 MG tablet Take 1,600 mg by mouth 3 (three) times daily with meals. 12/23/19  Yes [provider]  traZODone (DESYREL) 50 MG tablet Take 25 mg by mouth at bedtime.   Yes [provider]  lidocaine (LIDODERM) 5 % Place 1 patch onto the skin every 12 (twelve) hours. Remove & Discard patch within 12 hours or as directed by MD Patient not taking: No sig reported 11/10/19 11/09/20  Laban Emperor, PA-C  losartan (COZAAR) 50 MG tablet Take 1 tablet (50 mg total) by mouth at bedtime. 12/05/19   Loletha Grayer, MD     Vital Signs: BP (!) 184/82   Pulse 98   Temp 98.78 F (37.1 C)   Resp 13   Ht 5\' 2"  (1.575 m)   Wt 169 lb 1.5 oz (76.7 kg)   SpO2 100%   BMI 30.93 kg/m   Physical Exam Vitals and nursing note reviewed.  Constitutional:      Appearance: He is well-developed.  HENT:     Head: Normocephalic.  Cardiovascular:     Rate and Rhythm: Normal rate and regular rhythm.  Pulmonary:     Comments: Vented on sesdation Abdominal:     Comments: RUQ drain to gravity bag. Site is unremarkable with no erythema, edema, tenderness, bleeding or drainage noted at exit site. Suture and stat lock in place. Dressing is clean dry and intact. 5  ml of  dark brown colored fluid noted to be in the gravity bag.  Per RN drain is  able to be flushed easily.   Musculoskeletal:     Cervical back: Normal range of motion.  Skin:    General: Skin is warm and dry.  Neurological:     Comments: Unable to assess. Patient sedated.     Imaging: CT HEAD WO CONTRAST  Result Date: 06/24/2020 CLINICAL DATA:  Status post cardiac arrest. EXAM: CT HEAD WITHOUT CONTRAST TECHNIQUE: Contiguous axial images were obtained from the base of the skull through the vertex without intravenous contrast. COMPARISON:  April 29, 2020. FINDINGS: Brain: Decreased size of lenticular extra-axial collection along the high RIGHT posterior frontal and parietal convexity with  mixed attenuation material layering dependently. No midline shift. No intraparenchymal hemorrhage. Tiny extra-axial collection, small subdural may be present along the LEFT convexity as well better seen on coronal images decreased in conspicuity compared to the previous exam no hydrocephalus. RIGHT sided extra-axial collection in coronal plane measuring 4.8 x 1.0 cm in greatest dimension as compared to 5.8 x 1.8 cm on the prior study. Less mass effect upon adjacent sulci (image 51/5) Very subtle area of extra-axial material on image 31 of series 5 May correspond to previous presumed extra-axial collection/small subdural. Diminished conspicuity of gray-white differentiation may be present. Vascular: No hyperdense vessel or unexpected calcification. Skull: Normal. Negative for fracture or focal lesion. Sinuses/Orbits: Scattered ethmoid opacification and signs of sphenoid sinus disease. Other: None IMPRESSION: 1. Loss of gray-white differentiation subtle in most areas but more pronounced particularly in the basal ganglia on the RIGHT compatible with early changes related to hypoxic/ischemic injury. No herniation or significant changes of edema at this time. 2. Decreased size of mixed density extra-axial collection along the RIGHT posterior frontal parietal region with decreased mass effect. 3. Tiny extra-axial collection, small subdural may be present along the LEFT convexity as well better seen on coronal images decreased in conspicuity compared to the previous exam. 4. Potential diminished gray-white differentiation. 5. No midline shift. 6. Scattered ethmoid opacification and signs of sphenoid sinus disease. 7. Critical Value/emergent results were called by telephone at the time of interpretation on 06/24/2020 at 11:31 am to provider [facility] , who verbally acknowledged these results. Electronically Signed   By: Zetta Bills M.D.   On: 06/24/2020 10:58   CT ABDOMEN PELVIS W CONTRAST  Result Date:  06/22/2020 CLINICAL DATA:  65 year old male with abdominal pain. EXAM: CT ABDOMEN AND PELVIS WITH CONTRAST TECHNIQUE: Multidetector CT imaging of the abdomen and pelvis was performed using the standard protocol following bolus administration of intravenous contrast. CONTRAST:  11mL OMNIPAQUE IOHEXOL 300 MG/ML  SOLN COMPARISON:  Abdominal ultrasound dated 06/04/2020. CT abdomen pelvis dated 11/10/2019. FINDINGS: Evaluation of this exam is limited due to respiratory motion artifact. Lower chest: Bibasilar linear atelectasis. There is coronary vascular calcification and calcification of the mitral annulus. Partially visualized central venous line with tip at the cavoatrial junction. No intra-abdominal free air or free fluid. Hepatobiliary: The liver is unremarkable. No intrahepatic biliary dilatation. There is a stone the gallbladder. The gallbladder is distended. There is gallbladder wall edema with pericholecystic fluid and stranding. Findings most consistent with acute cholecystitis. Further evaluation with ultrasound recommended. Pancreas: Unremarkable. No pancreatic ductal dilatation or surrounding inflammatory changes. Spleen: There is an area of hypoenhancement involving the midportion of the spleen most concerning for splenic infarct. Adrenals/Urinary Tract: The adrenal glands unremarkable. Vascular calcification versus small nonobstructing bilateral renal calculi measure up to 3 mm in the lower pole of the right kidney. There is no hydronephrosis on either side.  There is slight heterogeneous enhancement the renal parenchyma. Correlation with urinalysis recommended to exclude UTI. The visualized ureters and urinary bladder appear unremarkable. Stomach/Bowel: There is no bowel obstruction or active inflammation. The appendix is normal. Vascular/Lymphatic: Mild aortoiliac atherosclerotic disease. The IVC is unremarkable. No portal venous gas. There is no adenopathy. Reproductive: Enlarged prostate gland  measuring 5 cm in transverse axial diameter. The seminal vesicles are symmetric. Other: Small fat containing bilateral inguinal hernia. There is slight protrusion of sigmoid colon into the left inguinal canal. Musculoskeletal: Osteopenia with degenerative changes of the spine. Old L1 compression fracture. There is compression fracture of inferior endplate of Z61, new since the prior CT. Correlation with clinical exam and point tenderness recommended. IMPRESSION: 1. Cholelithiasis with findings most consistent with acute cholecystitis. Further evaluation with ultrasound recommended. 2. 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 3. No bowel obstruction. Normal appendix. 4. Aortic Atherosclerosis (ICD10-I70.0). Electronically Signed   By: Anner Crete M.D.   On: 06/22/2020 21:28   IR Perc Cholecystostomy  Result Date: 06/23/2020 INDICATION: Acute cholecystitis. Please perform image guided cholecystostomy tube placement for infection source control purposes. EXAM: ULTRASOUND AND FLUOROSCOPIC-GUIDED CHOLECYSTOSTOMY TUBE PLACEMENT COMPARISON:  CT abdomen and pelvis-06/22/2020; right upper quadrant abdominal ultrasound-07/01/2020 MEDICATIONS: The patient is currently admitted to the hospital and on intravenous antibiotics. Antibiotics were administered within an appropriate time frame prior to skin puncture. ANESTHESIA/SEDATION: Patient is currently intubated and sedated in the ICU. CONTRAST:  None, no contrast was administered secondary to patient's anaphylactic reaction to intravenous contrast during abdominal CT performed 06/22/2020. FLUOROSCOPY TIME:  18 seconds (3 mGy) COMPLICATIONS: None immediate. PROCEDURE: Informed written consent was obtained from the patient's family after a discussion of the risks, benefits and alternatives to treatment. Questions regarding the procedure were encouraged and answered. A timeout was performed prior to the initiation of the procedure. The right upper abdominal quadrant was prepped and  draped in the usual sterile fashion, and a sterile drape was applied covering the operative field. Maximum barrier sterile technique with sterile gowns and gloves were used for the procedure. A timeout was performed prior to the initiation of the procedure. Local anesthesia was provided with 1% lidocaine with epinephrine. Ultrasound scanning of the right upper quadrant demonstrates a markedly dilated gallbladder with gallbladder wall thickening and minimal amount of pericholecystic fluid. Utilizing a transhepatic approach, a 22 gauge needle was advanced into the gallbladder under direct ultrasound guidance. An ultrasound image was saved for documentation purposes. Appropriate intraluminal puncture was confirmed with the efflux of bile and advancement of an 0.018 wire into the gallbladder lumen. The needle was exchanged for an Mayer set. A small amount of contrast was injected to confirm appropriate intraluminal positioning. Over a Benson wire, a 37.2-French Cook cholecystomy tube was advanced into the gallbladder fossa, coiled and locked. Bile was aspirated and several post procedural spot radiographic images were obtained in various obliquities. Again, no contrast was administered secondary to patient's anaphylaxis to intravenous contrast received during abdominal CT performed 06/22/2020. The catheter was secured to the skin with suture, connected to a drainage bag and a dressing was placed. The patient tolerated the procedure well without immediate post procedural complication. IMPRESSION: Successful ultrasound and fluoroscopic guided placement of a 10.2 French cholecystostomy tube. Electronically Signed   By: Sandi Mariscal M.D.   On: 06/23/2020 14:26   DG CHEST PORT 1 VIEW  Result Date: 06/26/2020 CLINICAL DATA:  Respiratory failure EXAM: PORTABLE CHEST 1 VIEW COMPARISON:  06/24/2020 FINDINGS: Endotracheal tube is been withdrawn, now 6.1 cm  above the carina. Nasogastric tube extends into the upper abdomen  beyond the margin of the examination. Right internal jugular hemodialysis catheter tip noted at the superior cavoatrial junction. The lungs are symmetrically well expanded. Focal opacity within the right infrahilar region may represent right middle lobe atelectasis or infiltrate. The lungs are otherwise clear. No pneumothorax or pleural effusion. Cardiac size within normal limits. Pulmonary vascularity is normal. IMPRESSION: Support lines and tubes in appropriate position. Improved pulmonary insufflation. Focal opacity within the right infrahilar region, atelectasis versus infiltrate. Electronically Signed   By: Fidela Salisbury MD   On: 06/26/2020 02:10   DG CHEST PORT 1 VIEW  Result Date: 06/24/2020 CLINICAL DATA:  Respiratory failure.  Intubation. EXAM: PORTABLE CHEST 1 VIEW COMPARISON:  06/22/2020. FINDINGS: Endotracheal tube tip 1.5 cm above the carina. NG tube tip below left hemidiaphragm. Dialysis catheter in stable position. Cardiomegaly. No pulmonary venous congestion. Low lung volumes with prominent bibasilar subsegmental atelectasis noted on today's exam. No pleural effusion or pneumothorax. IMPRESSION: Endotracheal tube tip 1.5 cm above the carina. NG tube tip below left hemidiaphragm. Dialysis catheter stable position. 2.  Cardiomegaly.  No pulmonary venous congestion. 3. Low lung volumes with prominent bibasilar subsegmental atelectasis noted on today's exam. Electronically Signed   By: Marcello Moores  Register   On: 06/24/2020 07:13   DG CHEST PORT 1 VIEW  Result Date: 06/22/2020 CLINICAL DATA:  Emergent intubation with NG tube placement. EXAM: PORTABLE CHEST 1 VIEW COMPARISON:  Chest radiograph 12/05/2019 FINDINGS: Endotracheal tube tip is 3.7 cm from the carina. Tip and side port of the enteric tube below the diaphragm in the stomach there is a right internal jugular dialysis catheter with tip at the atrial caval junction. Previous left-sided dialysis catheter is no longer seen. Stable heart size and  mediastinal contours vascular congestion. No focal airspace disease. No pneumothorax. No acute osseous abnormalities are seen. IMPRESSION: 1. Endotracheal tube tip 3.7 cm from the carina. Enteric tube in place. 2. Right internal jugular dialysis catheter with tip at the atrial caval junction. 3. Vascular congestion. Electronically Signed   By: Keith Rake M.D.   On: 06/22/2020 22:42   DG Abd Portable 1V  Result Date: 06/22/2020 CLINICAL DATA:  NG tube placement. EXAM: PORTABLE ABDOMEN - 1 VIEW COMPARISON:  Abdominopelvic CT earlier today FINDINGS: Tip and side port of the enteric tube below the diaphragm in the stomach enteric contrast is seen throughout the colon from prior CT. No free air or obstruction. Known gallstone is not well visualized IMPRESSION: Tip and side port of the enteric tube below the diaphragm in the stomach. Electronically Signed   By: Keith Rake M.D.   On: 06/22/2020 22:42   EEG adult  Result Date: 06/23/2020 Greta Doom, MD     06/23/2020  1:54 AM History: 65 yo M s/p cardiac arrest. Sedation: Propofol 5 mcg/kg/min Technique: This is a 21 channel routine scalp EEG performed at the bedside with bipolar and monopolar montages arranged in accordance to the international 10/20 system of electrode placement. One channel was dedicated to EKG recording. Background: The background is diffusely attenuated with occasional epileptiform bursts lasting 1-2 seconds associated with generalized myoclonus.  Photic stimulation: Physiologic driving is not performed EEG Abnormalities: 1) Burst suppression EEG with myoclonus assocaited with bursts Clinical Interpretation: This EEG demonstrates evidence of profound generalized cerebral dysfunction as can be seen with hypoxic brain injury. In the post-cardiac arrest setting, this EEG is highly correlated with poor prognosis. Levetiracetam and valproic acid can  both sometimes be useful in controlling the symptomatic movements, as can  propofol. Roland Rack, MD [facility] [phone] If 7pm- 7am, please page neurology on call as listed in [place].   Overnight EEG with video  Result Date: 06/23/2020 Lora Havens, MD     06/24/2020  8:48 AM Patient Name: Daniel Thomas MRN: [id number] Epilepsy Attending: Lora Havens Referring Physician/Provider: Dr. Kathrynn Speed Duration: 06/23/2020 0203 to 06/24/2020 0203 Patient history: 65 year old male status post cardiac arrest.  EEG done for seizures. Level of alertness: Comatose. AEDs during EEG study: Keppra, Depakote, propofol Technical aspects: This EEG study was done with scalp electrodes positioned according to the 10-20 International system of electrode placement. Electrical activity was acquired at a sampling rate of 500Hz  and reviewed with a high frequency filter of 70Hz  and a low frequency filter of 1Hz . EEG data were recorded continuously and digitally stored. Description: EEG initially showed near continuous generalized background suppression.  Every 2 to 15 seconds generalized highly epileptiform bursts were noted.  During the burst patient was noted to have brief generalized whole body jerking consistent with myoclonic seizure. After around 8 AM on 06/23/2020, EEG predominantly showed generalized background suppression. After around 2300 on 06/14/2020, EEG again showed intermittent bursts of high amplitude 3 to 5 Hz sharply contoured theta-delta slowing lasting 0.5-1 second every 10-30 seconds. EEG was not reactive to tactile stimulation. Hyperventilation and photic stimulation were not performed.   ABNORMALITY -Myoclonic seizure, generalized -Burst suppression with highly epileptiform discharges, generalized IMPRESSION: This study initially showed myoclonic seizures every 2 to 15 seconds. After around 8 AM on 06/23/2020, no further seizures were noted. EEG also showed profound diffuse encephalopathy.  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 Lora Havens   ECHOCARDIOGRAM COMPLETE  Result Date: 06/24/2020    ECHOCARDIOGRAM REPORT   Patient Name:   XAIDEN FLEIG Date of Exam: 06/24/2020 Medical Rec #:  [id number]      Height:       61.0 in Accession #:    [id number]     Weight:       182.5 lb Date of Birth:  12-11-1955      BSA:          1.817 m Patient Age:    65 years       BP:           149/68 mmHg Patient Gender: M              HR:           80 bpm. Exam Location:  Inpatient Procedure: 2D Echo, Cardiac Doppler, Color Doppler, Strain Analysis and 3D Echo Indications:    Cardiac Arrest I46.9  History:        Patient has prior history of Echocardiogram examinations, most                 recent 12/18/2019. Signs/Symptoms:Shortness of Breath; Risk                 Factors:Hypertension.  Sonographer:    Merrie Roof RDCS Referring Phys: [id number] RAHUL P DESAI IMPRESSIONS  1. Left ventricular ejection fraction, by estimation, is 60 to 65%. The left ventricle has normal function. The left ventricle has no regional wall motion abnormalities. There is moderate concentric left ventricular hypertrophy. Left ventricular diastolic parameters are consistent with Grade I diastolic dysfunction (impaired relaxation). Elevated left ventricular end-diastolic pressure.  2. Right ventricular systolic function  is normal. The right ventricular size is normal.  3. The mitral valve is normal in structure. Trivial mitral valve regurgitation. No evidence of mitral stenosis. Moderate mitral annular calcification.  4. The aortic valve is tricuspid. There is mild calcification of the aortic valve. There is mild thickening of the aortic valve. Aortic valve regurgitation is not visualized. No aortic stenosis is present.  5. IVC not dilated. Unable to assess for respiratory collapse as patient is on a ventilator. FINDINGS  Left Ventricle: Left ventricular ejection fraction, by estimation, is 60 to 65%. The left ventricle has normal function. The  left ventricle has no regional wall motion abnormalities. Global longitudinal strain performed but not reported based on interpreter judgement due to suboptimal tracking. The left ventricular internal cavity size was normal in size. There is moderate concentric left ventricular hypertrophy. Left ventricular diastolic parameters are consistent with Grade I diastolic dysfunction (impaired relaxation). Elevated left ventricular end-diastolic pressure. Right Ventricle: The right ventricular size is normal. No increase in right ventricular wall thickness. Right ventricular systolic function is normal. Left Atrium: Left atrial size was normal in size. Right Atrium: Right atrial size was normal in size. Pericardium: There is no evidence of pericardial effusion. Mitral Valve: The mitral valve is normal in structure. Moderate mitral annular calcification. Trivial mitral valve regurgitation. No evidence of mitral valve stenosis. Tricuspid Valve: The tricuspid valve is normal in structure. Tricuspid valve regurgitation is not demonstrated. No evidence of tricuspid stenosis. Aortic Valve: The aortic valve is tricuspid. There is mild calcification of the aortic valve. There is mild thickening of the aortic valve. Aortic valve regurgitation is not visualized. No aortic stenosis is present. Aortic valve mean gradient measures 6.0 mmHg. Aortic valve peak gradient measures 13.5 mmHg. Aortic valve area, by VTI measures 1.69 cm. Pulmonic Valve: The pulmonic valve was normal in structure. Pulmonic valve regurgitation is trivial. No evidence of pulmonic stenosis. Aorta: The aortic root is normal in size and structure. Venous: IVC not dilated. Unable to assess for respiratory collapse as patient is on a ventilator. IAS/Shunts: No atrial level shunt detected by color flow Doppler.  LEFT VENTRICLE PLAX 2D LVIDd:         2.90 cm  Diastology LVIDs:         2.30 cm  LV e' lateral:   3.81 cm/s LV PW:         1.60 cm  LV E/e' lateral: 23.0 LV  IVS:        1.60 cm LVOT diam:     2.00 cm LV SV:         56 LV SV Index:   31 LVOT Area:     3.14 cm 3D Volume EF:                         3D EF:        54 %                         LV EDV:       98 ml                         LV ESV:       45 ml                         LV SV:  52 ml RIGHT VENTRICLE RV Basal diam:  4.20 cm RV Mid diam:    3.40 cm LEFT ATRIUM             Index       RIGHT ATRIUM           Index LA diam:        3.80 cm 2.09 cm/m  RA Area:     16.50 cm LA Vol (A2C):   36.2 ml 19.92 ml/m RA Volume:   44.40 ml  24.44 ml/m LA Vol (A4C):   28.6 ml 15.74 ml/m LA Biplane Vol: 32.2 ml 17.72 ml/m  AORTIC VALVE AV Area (Vmax):    1.62 cm AV Area (Vmean):   1.85 cm AV Area (VTI):     1.69 cm AV Vmax:           184.00 cm/s AV Vmean:          118.000 cm/s AV VTI:            0.329 m AV Peak Grad:      13.5 mmHg AV Mean Grad:      6.0 mmHg LVOT Vmax:         94.60 cm/s LVOT Vmean:        69.500 cm/s LVOT VTI:          0.177 m LVOT/AV VTI ratio: 0.54  AORTA Ao Root diam: 3.10 cm Ao Asc diam:  3.20 cm MITRAL VALVE MV Area (PHT): 3.03 cm     SHUNTS MV Decel Time: 250 msec     Systemic VTI:  0.18 m MV E velocity: 87.80 cm/s   Systemic Diam: 2.00 cm MV A velocity: 139.00 cm/s MV E/A ratio:  0.63 Skeet Latch MD Electronically signed by Skeet Latch MD Signature Date/Time: 06/24/2020/4:32:03 PM    Final     Labs:  CBC: Recent Labs    06/23/20 0517 06/24/20 0316 06/25/20 0352 06/26/20 0334 06/26/20 0336 06/26/20 0621  WBC 30.4* 22.6* 25.8* 32.5*  --   --   HGB 8.4* 8.3* 8.7* 9.2* 9.5* 9.5*  HCT 25.7* 24.1* 26.6* 27.6* 28.0* 28.0*  PLT 272 289 311 297  --   --     COAGS: Recent Labs    12/05/19 0402  INR 1.1  APTT 31    BMP: Recent Labs    06/29/19 0340 10/29/19 1205 06/24/20 1520 06/25/20 0352 06/25/20 1712 06/26/20 0334 06/26/20 0336 06/26/20 0621  NA 137   < > 137 134* 133* 134* 135 137  K 3.4*   < > 3.4* 5.2* 4.4 3.9 3.9 4.0  CL 99   < > 114* 102 104 101   --   --   CO2 26   < > 17* 23 26 23   --   --   GLUCOSE 120*   < > 112* 166* 217* 269*  --   --   BUN 34*   < > 29* 34* 37* 40*  --   --   CALCIUM 9.4   < > 6.0* 8.2* 7.6* 8.3*  --   --   CREATININE 6.35*   < > 3.06* 3.20* 2.67* 2.27*  --   --   GFRNONAA 9*   < > 22* 21* 26* 31*  --   --   GFRAA 10*  --   --   --   --   --   --   --    < > = values in this interval not  displayed.    LIVER FUNCTION TESTS: Recent Labs    06/23/20 0517 06/23/20 1605 06/24/20 0316 06/24/20 1520 06/25/20 0352 06/25/20 1712 06/26/20 0334  BILITOT 1.9*  --  0.8  --  0.5  --  0.8  AST 901*  --  207*  --  89*  --  64*  ALT 352*  --  240*  --  179*  --  122*  ALKPHOS 168*  --  139*  --  163*  --  201*  PROT 5.9*  --  5.7*  --  6.2*  --  6.0*  ALBUMIN 2.6*   < > 2.4* 1.7* 2.6* 2.6* 2.5*   < > = values in this interval not displayed.    Assessment and Plan:  65 y.o. male inpatient. History of ESRD. COPD, CHF ,DM, HTN. Presented to the ED at [facility] on 6.18.22 with vomitting. Found to have symptomatic cholelithiasis with possible acute cholecystitis. Patient was afebrile at the time with no leukocytosis. Patient and family declined surgery. Hospital stay complicated by PEA arrest . Now intubated due to respiratory failure with suspected anoxic brain injury. On CCRT. On 6.21.22 IR placed a cholecystomy tube.  WBC 32.5 (up-trending),  AST 64, ALT 122, GFR 31, Cr 2.27, BUN 40, alkaline phosphatase 201, calcium 8.6, triglycerides 231. [facility] microbiology sent from fluid at time of placement.  No pertinent imaging since cholecystomy tube placement. Per Epic output is 115 mls, 30 mls, 135 ml. RN at bedside states she just removed 75 ml. RUQ drain to gravity bag. Site is unremarkable with no erythema, edema, tenderness, bleeding or drainage noted at exit site. Suture and stat lock in place. Dressing is clean dry and intact. 5  ml of  dark brown colored fluid noted to be in the gravity bag.  Per RN drain is able to be flushed  easily.   Cholecystomy tube drain will need to remain in place at least 4-6 weeks unless gallbaldder surgically removed in interim; continue  to record output q shift; tid drain flushes inhouse and once daily with 5 ml sterile normal saline as outpatient; will schedule f/u cholangiogram in 4-6 weeks   Electronically Signed: Jacqualine Mau, NP 06/26/2020, 1:28 PM   I spent a total of 15 Minutes at the the patient's bedside AND on the patient's hospital floor or unit, greater than 50% of which was counseling/coordinating care for cholecystomy tube placement

## 2020-06-26 NOTE — Progress Notes (Signed)
NAME:  Daniel Thomas, MRN:  [id number], DOB:  June 17, 1955, LOS: 6 ADMISSION DATE:  06/30/2020, CONSULTATION DATE:  6/20 REFERRING MD:  Dr. Thereasa Solo, CHIEF COMPLAINT:  N/V; Post Cardiac Arrest   History of Present Illness:  Patient is a 65 yo M with pertinent PMH of COPD, ESRD on dialysis, DM, Heart murmur, HTN, and hyperlipidemia presenting to [facility] on 6/18 with N/V.  Patient arrived to [facility] on 6/18 stating his N/V began yesterday (6/17) during the night. He has not missed any dialysis treatments except for today. Pertinent ED labs show Creatinine of 9.6, Lipase 108. Nephrology consulted for ESRD. RUQ ultrasound show cholithiasis and patient has positive Murphy's sign. Surgery consulted. Patient declined surgery and perc tube placement at this time.   On 6/20, patient underwent CT abdomen with IV/oral contrast to evaluate gallbladder. While in CT patient lost pulse and cardiac arrested for 30 minutes before ROSC. Patient tongue appeared swollen and required intubation with a 6.5 ET tube due to the swelling. Epi, steroids, calcium, mag, and bicarb were given. One shock was given during one intermission with V-fib on rhythm check.   PCCM consulted on 6/20 for post cardiac arrest care and mechanical ventilator/airway management.  Pertinent  Medical History   Past Medical History:  Diagnosis Date   COPD (chronic obstructive pulmonary disease) ([place])    Diabetes mellitus without complication ([place])    Enlarged prostate    ESRD on dialysis ([place])    Heart murmur    High cholesterol    Hypertension     Significant Hospital Events: Including procedures, antibiotic start and stop dates in addition to other pertinent events   6/18: admitted to [facility] with symptomatic cholelithiasis 6/20: CT abdomen with oral contrast ordered; patient cardiac arrested x30 minutes until ROSC and appeared anaphylactic from contrast with swollen tongue. Intubated with 6.5 ETT. Admitted to ICU. Patient went bradycardic and  cardiac arrested again and given 1 atropine, 1 epi, and bicarb given; ROSC after 2 mintues. Myoclonic activity: EEG pending. 6/21: Initiated on CRRT.  Percutaneous cholecystostomy 6/22 CT H  with signs of anoxic/hypoxic injury  6/23 Family discussed neuro prognostication with family. CCS plan to s/o   Interim History / Subjective:  Off all sedative infusions.  Off vasopressors.  Patient now hypertensive.  Best exam according to RN is extensor response to suctioning.  Objective   Blood pressure (!) 163/86, pulse 100, temperature 100.22 F (37.9 C), resp. rate 18, height 5\' 2"  (1.575 m), weight 76.7 kg, SpO2 100 %.    Vent Mode: PRVC FiO2 (%):  [30 %] 30 % Set Rate:  [12 bmp-20 bmp] 12 bmp Vt Set:  [420 mL-550 mL] 550 mL PEEP:  [5 cmH20] 5 cmH20 Pressure Support:  [14 cmH20-20 cmH20] 20 cmH20 Plateau Pressure:  [13 cmH20-21 cmH20] 16 cmH20   Intake/Output Summary (Last 24 hours) at 06/26/2020 1604 Last data filed at 06/26/2020 1400 Gross per 24 hour  Intake 1727.12 ml  Output 3274 ml  Net -1546.88 ml    Filed Weights   06/24/20 0500 06/25/20 0200 06/26/20 0400  Weight: 82.8 kg 80.4 kg 76.7 kg    Examination: General: Chronically and critically ill M, intubated, off sedation NAD  Neuro:  No response to painful stimuli. Does initiate respirations.  Coughs spontaneously.  Rooting reflex left lower lip.   HEENT: NCAT pink mm ETT secure anicteric sclera  Cardiovascular: rrr s1s2 cap refill < 3 seconds  Lungs: Symmetrical chest expansion. Abdominal breathing. O Abdomen: Per chole  tube C/D/I with minimal bilious output.  BS x 4, soft, NT/ND.  Musculoskeletal: No gross deformities, no edema.  Skin: Intact, warm, no rashes.   Resolved Hospital Problem list     Assessment & Plan:   Anoxic Encephalopathy  Subcortical myoclonus  -- now burst suppression pattern with CT evidence of loss of gray-white differentiation Anoxic brain injury likely S/p Cardiac arrest with ROSC  Possible  anaphylactic this is septic shock shock (following oral contrast) -- improved  Acute respiratory failure with hypoxia  Hx COPD  Sepsis due to acute cholecystitis s/p perc cholecystostomy with IR 6/21 ESRD Hyperkalemia, mild HTN Anemia of chronic renal disease Dm2 Inadequate PO intake  Plan:  -Continue antibiotics and biliary drainage. -Initiate enteral blood pressure medication. -Can discontinue Foley catheter and arterial line if present. -MRI pending to better assess for anoxic injury.  CT scan is not favorable.  Patient will likely require prolonged airway protection via tracheostomy and feeding tube. -Continue supportive care. -We will transition to conventional hemodialysis.  Best Practice (right click and "Reselect all SmartList Selections" daily)   Diet/type: NPO Pain/Anxiety/Delirium protocol RASS goal: 0 -- propofol off for neurologic exam  VAP protocol (if indicated): Yes DVT prophylaxis: LMWH GI prophylaxis: H2B Glucose control:  SSI Central venous access:  Yes, and it is still needed Arterial line:  removal ordered  Foley:  N/A Mobility:  bed rest  PT consulted: N/A Culture data pending:none Last reviewed culture data:today Antibiotics:cefepime and flagyl  Daily labs: ordered Code Status:  full code Last date of multidisciplinary goals of care discussion 6/23  Disposition: remains critically ill, will stay in intensive care   CRITICAL CARE Performed by: Kipp Brood   Total critical care time:  35 minutes  Critical care time was exclusive of separately billable procedures and treating other patients. Critical care was necessary to treat or prevent imminent or life-threatening deterioration.  Critical care was time spent personally by me on the following activities: development of treatment plan with patient and/or surrogate as well as nursing, discussions with consultants, evaluation of patient's response to treatment, examination of patient, obtaining  history from patient or surrogate, ordering and performing treatments and interventions, ordering and review of laboratory studies, ordering and review of radiographic studies, pulse oximetry and re-evaluation of patient's condition.  Kipp Brood, MD [facility] ICU Physician [place]  Pager: [phone] Or Epic Secure Chat After hours: [phone].  06/26/2020, 4:08 PM     06/26/2020, 4:04 PM

## 2020-06-26 NOTE — Progress Notes (Signed)
[place] Progress Note Patient Name: Daniel Thomas DOB: 10-Mar-1955 MRN: [id number]   Date of Service  06/26/2020  HPI/Events of Note  ABG on 30%/PRVC 20/TV 550/P 5 = 7.59/25.8/126/25.5  eICU Interventions  Plan: Decrease PRVC rate to 12. Repeat ABG at 6:30 AM.     Intervention Category Major Interventions: Respiratory failure - evaluation and management;Acid-Base disturbance - evaluation and management  Jadd Gasior Eugene 06/26/2020, 4:10 AM

## 2020-06-26 NOTE — Progress Notes (Signed)
Inpatient Diabetes Program Recommendations  AACE/ADA: New Consensus Statement on Inpatient Glycemic Control (2015)  Target Ranges:  Prepandial:   less than 140 mg/dL      Peak postprandial:   less than 180 mg/dL (1-2 hours)      Critically ill patients:  140 - 180 mg/dL   Lab Results  Component Value Date   GLUCAP 224 (H) 06/26/2020   HGBA1C 6.2 (H) 12/06/2019    Review of Glycemic Control Results for BURKE, TERRY (MRN [id number]) as of 06/26/2020 12:39  Ref. Range 06/25/2020 23:47 06/26/2020 03:32 06/26/2020 08:15  Glucose-Capillary Latest Ref Range: 70 - 99 mg/dL 125 (H) 247 (H) 224 (H)    Outpatient Diabetes medications: Tradjenta 5 mg QD Current orders for Inpatient glycemic control: Novolog 1-3 units Q4H, Levemir 5 units BID Vital @ 50 ml/hr  Inpatient Diabetes Program Recommendations:    Consider adding Novolog 2 units Q4H for tube feed coverage (to be stopped or held in the event tube feeds are stopped).   Thanks, Bronson Curb, MSN, RNC-OB Diabetes Coordinator [phone] (8a-5p)

## 2020-06-26 NOTE — Progress Notes (Signed)
[place] Progress Note Patient Name: Daniel Thomas DOB: [birth year] MRN: [id number]   Date of Service  06/26/2020  HPI/Events of Note  Ventilator asynchrony - improved post PRN Fentanyl dose. Deminished BS in L upper chest.   eICU Interventions  Plan: Portable CXR STAT. ABG at 5 AM.     Intervention Category Major Interventions: Respiratory failure - evaluation and management  Lysle Dingwall 06/26/2020, 12:44 AM

## 2020-06-27 ENCOUNTER — Inpatient Hospital Stay (HOSPITAL_COMMUNITY): Payer: Medicare Other

## 2020-06-27 DIAGNOSIS — I469 Cardiac arrest, cause unspecified: Secondary | ICD-10-CM

## 2020-06-27 DIAGNOSIS — G931 Anoxic brain damage, not elsewhere classified: Secondary | ICD-10-CM

## 2020-06-27 LAB — GLUCOSE, CAPILLARY
Glucose-Capillary: 177 mg/dL — ABNORMAL HIGH (ref 70–99)
Glucose-Capillary: 183 mg/dL — ABNORMAL HIGH (ref 70–99)
Glucose-Capillary: 184 mg/dL — ABNORMAL HIGH (ref 70–99)
Glucose-Capillary: 188 mg/dL — ABNORMAL HIGH (ref 70–99)
Glucose-Capillary: 192 mg/dL — ABNORMAL HIGH (ref 70–99)
Glucose-Capillary: 203 mg/dL — ABNORMAL HIGH (ref 70–99)
Glucose-Capillary: 207 mg/dL — ABNORMAL HIGH (ref 70–99)
Glucose-Capillary: 208 mg/dL — ABNORMAL HIGH (ref 70–99)
Glucose-Capillary: 218 mg/dL — ABNORMAL HIGH (ref 70–99)
Glucose-Capillary: 234 mg/dL — ABNORMAL HIGH (ref 70–99)
Glucose-Capillary: 299 mg/dL — ABNORMAL HIGH (ref 70–99)
Glucose-Capillary: 356 mg/dL — ABNORMAL HIGH (ref 70–99)
Glucose-Capillary: 123 mg/dL — ABNORMAL HIGH (ref 70–99)
Glucose-Capillary: 215 mg/dL — ABNORMAL HIGH (ref 70–99)

## 2020-06-27 LAB — CBC
HCT: 28.5 % — ABNORMAL LOW (ref 39.0–52.0)
Hemoglobin: 9.5 g/dL — ABNORMAL LOW (ref 13.0–17.0)
MCH: 32 pg (ref 26.0–34.0)
MCHC: 33.3 g/dL (ref 30.0–36.0)
MCV: 96 fL (ref 80.0–100.0)
Platelets: 339 10*3/uL (ref 150–400)
RBC: 2.97 MIL/uL — ABNORMAL LOW (ref 4.22–5.81)
RDW: 15.5 % (ref 11.5–15.5)
WBC: 43.2 10*3/uL — ABNORMAL HIGH (ref 4.0–10.5)
nRBC: 2.6 % — ABNORMAL HIGH (ref 0.0–0.2)

## 2020-06-27 LAB — COMPREHENSIVE METABOLIC PANEL
ALT: 77 U/L — ABNORMAL HIGH (ref 0–44)
AST: 50 U/L — ABNORMAL HIGH (ref 15–41)
Albumin: 2.3 g/dL — ABNORMAL LOW (ref 3.5–5.0)
Alkaline Phosphatase: 157 U/L — ABNORMAL HIGH (ref 38–126)
Anion gap: 11 (ref 5–15)
BUN: 86 mg/dL — ABNORMAL HIGH (ref 8–23)
CO2: 23 mmol/L (ref 22–32)
Calcium: 8.4 mg/dL — ABNORMAL LOW (ref 8.9–10.3)
Chloride: 103 mmol/L (ref 98–111)
Creatinine, Ser: 4.36 mg/dL — ABNORMAL HIGH (ref 0.61–1.24)
GFR, Estimated: 14 mL/min — ABNORMAL LOW (ref >60)
Glucose, Bld: 229 mg/dL — ABNORMAL HIGH (ref 70–99)
Potassium: 3.8 mmol/L (ref 3.5–5.1)
Sodium: 137 mmol/L (ref 135–145)
Total Bilirubin: 0.3 mg/dL (ref 0.3–1.2)
Total Protein: 5.7 g/dL — ABNORMAL LOW (ref 6.5–8.1)

## 2020-06-27 LAB — RENAL FUNCTION PANEL
Albumin: 2.3 g/dL — ABNORMAL LOW (ref 3.5–5.0)
Anion gap: 12 (ref 5–15)
BUN: 53 mg/dL — ABNORMAL HIGH (ref 8–23)
CO2: 25 mmol/L (ref 22–32)
Calcium: 8.3 mg/dL — ABNORMAL LOW (ref 8.9–10.3)
Chloride: 101 mmol/L (ref 98–111)
Creatinine, Ser: 3.2 mg/dL — ABNORMAL HIGH (ref 0.61–1.24)
GFR, Estimated: 21 mL/min — ABNORMAL LOW (ref >60)
Glucose, Bld: 216 mg/dL — ABNORMAL HIGH (ref 70–99)
Phosphorus: 3.6 mg/dL (ref 2.5–4.6)
Potassium: 4.4 mmol/L (ref 3.5–5.1)
Sodium: 138 mmol/L (ref 135–145)

## 2020-06-27 LAB — PHOSPHORUS: Phosphorus: 4.2 mg/dL (ref 2.5–4.6)

## 2020-06-27 LAB — MAGNESIUM: Magnesium: 3.2 mg/dL — ABNORMAL HIGH (ref 1.7–2.4)

## 2020-06-27 MED ORDER — VANCOMYCIN HCL 1500 MG/300ML IV SOLN
1500.0000 mg | Freq: Once | INTRAVENOUS | Status: AC
  Administered 2020-06-27: 1500 mg via INTRAVENOUS
  Filled 2020-06-27: qty 300

## 2020-06-27 MED ORDER — INSULIN DETEMIR 100 UNIT/ML ~~LOC~~ SOLN
20.0000 [IU] | Freq: Two times a day (BID) | SUBCUTANEOUS | Status: DC
  Administered 2020-06-27 – 2020-06-30 (×6): 20 [IU] via SUBCUTANEOUS
  Filled 2020-06-27 (×10): qty 0.2

## 2020-06-27 MED ORDER — HEPARIN SODIUM (PORCINE) 1000 UNIT/ML IJ SOLN
INTRAMUSCULAR | Status: AC
  Administered 2020-06-27: 1000 [IU] via INTRAVENOUS_CENTRAL
  Filled 2020-06-27: qty 4

## 2020-06-27 MED ORDER — VANCOMYCIN VARIABLE DOSE PER UNSTABLE RENAL FUNCTION (PHARMACIST DOSING): Status: DC

## 2020-06-27 MED ORDER — CALCITRIOL 1 MCG/ML IV SOLN
INTRAVENOUS | Status: AC
  Administered 2020-06-27: 0.25 ug
  Filled 2020-06-27: qty 1

## 2020-06-27 MED ORDER — INSULIN ASPART 100 UNIT/ML IJ SOLN
3.0000 [IU] | INTRAMUSCULAR | Status: DC
  Administered 2020-06-27: 3 [IU] via SUBCUTANEOUS
  Administered 2020-06-27: 9 [IU] via SUBCUTANEOUS
  Administered 2020-06-28: 3 [IU] via SUBCUTANEOUS
  Administered 2020-06-28: 9 [IU] via SUBCUTANEOUS
  Administered 2020-06-28: 7 [IU] via SUBCUTANEOUS
  Administered 2020-06-28: 9 [IU] via SUBCUTANEOUS
  Administered 2020-06-29: 3 [IU] via SUBCUTANEOUS
  Administered 2020-06-29 (×2): 6 [IU] via SUBCUTANEOUS
  Administered 2020-06-29 (×2): 3 [IU] via SUBCUTANEOUS
  Administered 2020-06-30: 6 [IU] via SUBCUTANEOUS
  Administered 2020-06-30: 9 [IU] via SUBCUTANEOUS
  Administered 2020-06-30 (×2): 3 [IU] via SUBCUTANEOUS
  Administered 2020-06-30: 9 [IU] via SUBCUTANEOUS

## 2020-06-27 MED ORDER — DEXTROSE 10 % IV SOLN: INTRAVENOUS | Status: DC | PRN

## 2020-06-27 MED ORDER — INSULIN ASPART 100 UNIT/ML IJ SOLN
7.0000 [IU] | INTRAMUSCULAR | Status: DC
  Administered 2020-06-27 – 2020-06-28 (×3): 7 [IU] via SUBCUTANEOUS

## 2020-06-27 NOTE — Progress Notes (Signed)
Pharmacy Antibiotic Note  Daniel Thomas is a 65 y.o. male admitted on 06/26/2020 with acute cholecystitis.  Pharmacy has been consulted for cefepime and metronidazole  dosing - now adding vancomycin with inc wbc 43 Tm101 pt s/p CRRT will adjust ABX for planned HD this evening   Plan: Vancomyicn 1548m IV x1 then follow up HD schedule Cefepime 2gm IV q24h Metrodinazole 500mg  IV q8h  Height: 5\' 2"  (157.5 cm) Weight: 80.5 kg (177 lb 7.5 oz) IBW/kg (Calculated) : 54.6  Temp (24hrs), Avg:100.4 F (38 C), Min:99.7 F (37.6 C), Max:101.3 F (38.5 C)  Recent Labs  Lab 06/22/20 2151 06/23/20 0517 06/23/20 1605 06/24/20 0316 06/24/20 1520 06/25/20 0352 06/25/20 1712 06/26/20 0334 06/26/20 1718 06/27/20 0445  WBC 29.3* 30.4*  --  22.6*  --  25.8*  --  32.5*  --  43.2*  CREATININE 9.12* 9.01*   < > 5.29*   < > 3.20* 2.67* 2.27* 2.98* 4.36*  LATICACIDVEN 8.1* 4.2*  --   --   --   --   --   --   --   --    < > = values in this interval not displayed.    Estimated Creatinine Clearance: 15.7 mL/min (A) (by C-G formula based on SCr of 4.36 mg/dL (H)).    Allergies  Allergen Reactions   Contrast Media [Iodinated Diagnostic Agents] Anaphylaxis    Cardiac arrest during a contrasted CT on 06/22/20. Airway edema noted during intubation.    Tape Dermatitis and Rash    Antimicrobials this admission:   Dose adjustments this admission:   Microbiology results: MRSA PCR + 6/25 Bcx ip    Bonnita Nasuti Pharm.D. CPP, BCPS Clinical Pharmacist [phone] 06/27/2020 2:15 PM

## 2020-06-27 NOTE — Progress Notes (Signed)
Tanya RN aware of order to remove CVC.

## 2020-06-27 NOTE — Plan of Care (Signed)
  Problem: Education: Goal: Knowledge of General Education information will improve Description: Including pain rating scale, medication(s)/side effects and non-pharmacologic comfort measures Outcome: Not Progressing   Problem: Health Behavior/Discharge Planning: Goal: Ability to manage health-related needs will improve Outcome: Not Progressing   Problem: Clinical Measurements: Goal: Ability to maintain clinical measurements within normal limits will improve Outcome: Not Progressing Goal: Will remain free from infection Outcome: Not Progressing Goal: Diagnostic test results will improve Outcome: Not Progressing Goal: Respiratory complications will improve Outcome: Not Progressing Goal: Cardiovascular complication will be avoided Outcome: Not Progressing   Problem: Activity: Goal: Risk for activity intolerance will decrease Outcome: Not Progressing   Problem: Nutrition: Goal: Adequate nutrition will be maintained Outcome: Not Progressing   Problem: Coping: Goal: Level of anxiety will decrease Outcome: Not Progressing   Problem: Elimination: Goal: Will not experience complications related to bowel motility Outcome: Not Progressing   Problem: Pain Managment: Goal: General experience of comfort will improve Outcome: Not Progressing   Problem: Safety: Goal: Ability to remain free from injury will improve Outcome: Not Progressing   Problem: Skin Integrity: Goal: Risk for impaired skin integrity will decrease Outcome: Not Progressing   Problem: Education: Goal: Knowledge of disease and its progression will improve Outcome: Not Progressing Goal: Individualized Educational Video(s) Outcome: Not Progressing   Problem: Fluid Volume: Goal: Compliance with measures to maintain balanced fluid volume will improve Outcome: Not Progressing   Problem: Health Behavior/Discharge Planning: Goal: Ability to manage health-related needs will improve Outcome: Not Progressing    Problem: Nutritional: Goal: Ability to make healthy dietary choices will improve Outcome: Not Progressing   Problem: Clinical Measurements: Goal: Complications related to the disease process, condition or treatment will be avoided or minimized Outcome: Not Progressing

## 2020-06-27 NOTE — Progress Notes (Signed)
RT assisted with transportation of this pt from 2H02 to MRI and back while on full ventilatory support. Pt tolerated well with SVS. RT will continue to monitor pt.

## 2020-06-27 NOTE — Progress Notes (Signed)
[place] Progress Note Patient Name: Daniel Thomas DOB: 24-Jul-1955 MRN: [id number]   Date of Service  06/27/2020  HPI/Events of Note  episode of emesis on vent.   Nurse gave zofran and stopped tube feed. Ok to keep tube feed off for the night?? Should they hook it to [facility]??  having BMS.  Discussed with RN.  Acute cholecystitis, perc cholostomy, Cardia arrest-MRI cerebral edema and herniation with poor prognosis.   eICU Interventions  - ok for LWIS for NG Ok to hold TF for tonight. Full code.      Intervention Category Intermediate Interventions: Other:  Elmer Sow 06/27/2020, 11:57 PM

## 2020-06-27 NOTE — Progress Notes (Signed)
NAME:  Daniel Thomas, MRN:  [id number], DOB:  12/21/1955, LOS: 7 ADMISSION DATE:  06/15/2020, CONSULTATION DATE:  6/20 REFERRING MD:  Dr. Thereasa Solo, CHIEF COMPLAINT:  N/V; Post Cardiac Arrest   History of Present Illness:  Patient is a 65 yo M with pertinent PMH of COPD, ESRD on dialysis, DM, Heart murmur, HTN, and hyperlipidemia presenting to [facility] on 6/18 with N/V.  Patient arrived to [facility] on 6/18 stating his N/V began yesterday (6/17) during the night. He has not missed any dialysis treatments except for today. Pertinent ED labs show Creatinine of 9.6, Lipase 108. Nephrology consulted for ESRD. RUQ ultrasound show cholithiasis and patient has positive Murphy's sign. Surgery consulted. Patient declined surgery and perc tube placement at this time.   On 6/20, patient underwent CT abdomen with IV/oral contrast to evaluate gallbladder. While in CT patient lost pulse and cardiac arrested for 30 minutes before ROSC. Patient tongue appeared swollen and required intubation with a 6.5 ET tube due to the swelling. Epi, steroids, calcium, mag, and bicarb were given. One shock was given during one intermission with V-fib on rhythm check.   PCCM consulted on 6/20 for post cardiac arrest care and mechanical ventilator/airway management.  Pertinent  Medical History   Past Medical History:  Diagnosis Date   COPD (chronic obstructive pulmonary disease) ([place])    Diabetes mellitus without complication ([place])    Enlarged prostate    ESRD on dialysis ([place])    Heart murmur    High cholesterol    Hypertension     Significant Hospital Events: Including procedures, antibiotic start and stop dates in addition to other pertinent events   6/18: admitted to [facility] with symptomatic cholelithiasis 6/20: CT abdomen with oral contrast ordered; patient cardiac arrested x30 minutes until ROSC and appeared anaphylactic from contrast with swollen tongue. Intubated with 6.5 ETT. Admitted to ICU. Patient went bradycardic and  cardiac arrested again and given 1 atropine, 1 epi, and bicarb given; ROSC after 2 mintues. Myoclonic activity: EEG pending. 6/21: Initiated on CRRT.  Percutaneous cholecystostomy 6/22 CT H  with signs of anoxic/hypoxic injury  6/23 discussed neuro prognostication with family. CCS plan to s/o  6/24 Best exam is extensor response to suctioning, insulin drip for hyperglycemia  Interim History / Subjective:   Low-grade febrile last 24 hours Hemodynamically stable, critically ill, intubated Examined on dialysis On insulin drip  Objective   Blood pressure 92/62, pulse 83, temperature 99.9 F (37.7 C), resp. rate (!) 24, height 5\' 2"  (1.575 m), weight 81 kg, SpO2 100 %.    Vent Mode: PRVC FiO2 (%):  [30 %] 30 % Set Rate:  [12 bmp] 12 bmp Vt Set:  [550 mL] 550 mL PEEP:  [5 cmH20] 5 cmH20 Plateau Pressure:  [16 cmH20-18 cmH20] 17 cmH20   Intake/Output Summary (Last 24 hours) at 06/27/2020 1031 Last data filed at 06/27/2020 0900 Gross per 24 hour  Intake 2135.36 ml  Output 206 ml  Net 1929.36 ml    Filed Weights   06/26/20 0400 06/27/20 0452 06/27/20 0941  Weight: 76.7 kg 76.9 kg 81 kg    Examination: General: Chronically and critically ill M, intubated, off sedation NAD  Neuro: No response to deep pain stimulus, no posturing, unable to elicit plantars, pupils 3 to 4 mm minimally reactive to light HEENT: NCAT pink mm ETT secure anicteric sclera  Cardiovascular: rrr s1s2 cap refill < 3 seconds  Lungs: No accessory muscle use, bilateral air entry present Abdomen: Per chole tube  C/D/I with minimal bilious output.  BS x 4, soft, NT/ND.  Musculoskeletal: No gross deformities, no edema.  Skin: Intact, warm, no rashes.   Chest x-ray 6/24 independently reviewed ET tube appears high, right infrahilar infiltrate  Resolved Hospital Problem list     Assessment & Plan:   Anoxic Encephalopathy  Subcortical myoclonus  -- now burst suppression pattern with CT evidence of loss of  gray-white differentiation  -Plan is for MRI today Appreciate neuro input 6/23 -Continuing Keppra and Depakote  Acute respiratory failure with hypoxia  Hx COPD   -Start spontaneous breathing trials -But if family wants to proceed, will likely require tracheostomy  S/p Cardiac arrest with ROSC  Echo shows normal LVEF and normal RV function, elevated LVEDP   Sepsis due to acute cholecystitis s/p perc cholecystostomy with IR 6/21 Fever with increasing leukocytosis -Cefepime and Flagyl, plan for 7 days total -Add vancomycin empiric, obtain blood cultures, may have to DC femoral line   ESRD Anemia of chronic renal disease -Transition of CRRT to intermittent dialysis - aranesp per renal   HTN -Resume amlodipine and Coreg  Dm2, uncontrolled hyperglycemia  -Transition off insulin drip per phase 3 protocol, Levemir 20 units every 12 with 7 units every 4 hours for to feed coverage     Best Practice (right click and "Reselect all SmartList Selections" daily)   Diet/type: NPO Pain/Anxiety/Delirium protocol RASS goal: 0  no sedation VAP protocol (if indicated): Yes DVT prophylaxis: LMWH GI prophylaxis: H2B Glucose control:  SSI Central venous access:  Yes, and it is still needed Arterial line:  removal ordered  Foley:  N/A Mobility:  bed rest  PT consulted: N/A  Code Status:  full code Last date of multidisciplinary goals of care discussion 6/23  Disposition: remains critically ill, will stay in intensive care   CRITICAL CARE Performed by: Leanna Sato Fredda Clarida   Total critical care time:  34 minutes  Critical care time was exclusive of separately billable procedures and treating other patients. Critical care was necessary to treat or prevent imminent or life-threatening deterioration.  Critical care was time spent personally by me on the following activities: development of treatment plan with patient and/or surrogate as well as nursing, discussions with consultants,  evaluation of patient's response to treatment, examination of patient, obtaining history from patient or surrogate, ordering and performing treatments and interventions, ordering and review of laboratory studies, ordering and review of radiographic studies, pulse oximetry and re-evaluation of patient's condition.   Kara Mead MD. Shade Flood. [place] [facility] Pager : [phone]  If no response to pager , please call [phone] until 7 pm After 7:00 pm call Elink  [phone]     06/27/2020, 10:31 AM

## 2020-06-27 NOTE — Progress Notes (Signed)
Updated brother merdis to MRI results with cerebral edema & herniation & once again gave poor neurological prognosis for meaningful recovery. He was appreciative but 'not ready to make any decisions'. Specifically, requested that his brother Wille Glaser not be allowed to make any unilateral decisions for Jaivyn. He has 3 other brothers but [name] is the most involved & has been Conservator, museum/gallery caregiver for last year He again recounted how his mother had survived a stroke after 3 weeks. Not ready to speak with palliative care.  Leanna Sato Elsworth Soho MD

## 2020-06-27 NOTE — Progress Notes (Signed)
[facility] Progress Note   Subjective:    Spoke with brother yesterday and told him my plan of transitioning off of CRRT to IHD-  took off of CRRT yesterday-  no major change overnight    Objective Vitals:   06/27/20 0400 06/27/20 0452 06/27/20 0500 06/27/20 0600  BP: 140/69   109/71  Pulse: 96  99 97  Resp: 18  14 11   Temp: 100.04 F (37.8 C)  100.04 F (37.8 C) (!) 100.4 F (38 C)  TempSrc: Esophageal  Esophageal Esophageal  SpO2: 100%  100% 100%  Weight:  76.9 kg    Height:       Physical Exam General: intubated, sedated Heart: Regular Lungs: Coarse bs b/l Abdomen: Round, distended but soft, active bowel sounds Extremities: pitting edema bilateral lower extremities Dialysis Access: [facility] R chest  NEURO: encephalopathic  Filed Weights   06/25/20 0200 06/26/20 0400 06/27/20 0452  Weight: 80.4 kg 76.7 kg 76.9 kg    Intake/Output Summary (Last 24 hours) at 06/27/2020 0732 Last data filed at 06/27/2020 0600 Gross per 24 hour  Intake 2050.97 ml  Output 805 ml  Net 1245.97 ml    Additional Objective Labs: Basic Metabolic Panel: Recent Labs  Lab 06/26/20 0334 06/26/20 0336 06/26/20 0621 06/26/20 1718 06/27/20 0445  NA 134*   < > 137 136 137  K 3.9   < > 4.0 4.1 3.8  CL 101  --   --  101 103  CO2 23  --   --  22 23  GLUCOSE 269*  --   --  391* 229*  BUN 40*  --   --  58* 86*  CREATININE 2.27*  --   --  2.98* 4.36*  CALCIUM 8.3*  --   --  8.3* 8.4*  PHOS 3.1  --   --  4.2 4.2   < > = values in this interval not displayed.   Liver Function Tests: Recent Labs  Lab 06/25/20 0352 06/25/20 1712 06/26/20 0334 06/26/20 1718 06/27/20 0445  AST 89*  --  64*  --  50*  ALT 179*  --  122*  --  77*  ALKPHOS 163*  --  201*  --  157*  BILITOT 0.5  --  0.8  --  0.3  PROT 6.2*  --  6.0*  --  5.7*  ALBUMIN 2.6*   < > 2.5* 2.4* 2.3*   < > = values in this interval not displayed.   Recent Labs  Lab 06/21/20 0233 06/22/20 0103  LIPASE 87* 40    CBC: Recent Labs  Lab 06/22/20 2151 06/22/20 2233 06/23/20 0517 06/24/20 0316 06/25/20 0352 06/26/20 0334 06/26/20 0336 06/26/20 0621 06/27/20 0445  WBC 29.3*  --  30.4* 22.6* 25.8* 32.5*  --   --  43.2*  NEUTROABS 22.4*  --   --   --   --   --   --   --   --   HGB 8.5*   < > 8.4* 8.3* 8.7* 9.2* 9.5* 9.5* 9.5*  HCT 26.5*   < > 25.7* 24.1* 26.6* 27.6* 28.0* 28.0* 28.5*  MCV 97.8  --  94.5 90.3 94.7 92.9  --   --  96.0  PLT 194  --  272 289 311 297  --   --  339   < > = values in this interval not displayed.   Blood Culture    Component Value Date/Time   SDES BLOOD RIGHT ANTECUBITAL 12/09/2019 0842  SPECREQUEST  12/09/2019 0842    BOTTLES DRAWN AEROBIC AND ANAEROBIC Blood Culture adequate volume   CULT  12/09/2019 0842    NO GROWTH 5 DAYS Performed at [facility], [address] [place], [place] [address]    REPTSTATUS 12/14/2019 FINAL 12/09/2019 0175    Cardiac Enzymes: No results for input(s): CKTOTAL, CKMB, CKMBINDEX, TROPONINI in the last 168 hours. CBG: Recent Labs  Lab 06/25/20 2347 06/26/20 0332 06/26/20 0815 06/26/20 1213 06/26/20 1726  GLUCAP 125* 247* 224* 251* 337*   Iron Studies: No results for input(s): IRON, TIBC, TRANSFERRIN, FERRITIN in the last 72 hours. Lab Results  Component Value Date   INR 1.1 12/05/2019   INR 1.1 05/12/2019   Studies/Results: DG CHEST PORT 1 VIEW  Result Date: 06/26/2020 CLINICAL DATA:  Respiratory failure EXAM: PORTABLE CHEST 1 VIEW COMPARISON:  06/24/2020 FINDINGS: Endotracheal tube is been withdrawn, now 6.1 cm above the carina. Nasogastric tube extends into the upper abdomen beyond the margin of the examination. Right internal jugular hemodialysis catheter tip noted at the superior cavoatrial junction. The lungs are symmetrically well expanded. Focal opacity within the right infrahilar region may represent right middle lobe atelectasis or infiltrate. The lungs are otherwise clear. No pneumothorax or  pleural effusion. Cardiac size within normal limits. Pulmonary vascularity is normal. IMPRESSION: Support lines and tubes in appropriate position. Improved pulmonary insufflation. Focal opacity within the right infrahilar region, atelectasis versus infiltrate. Electronically Signed   By: Fidela Salisbury MD   On: 06/26/2020 02:10    Medications:  ceFEPime (MAXIPIME) IV     insulin 5.5 Units/hr (06/27/20 0400)   levETIRAcetam Stopped (06/27/20 0333)   metronidazole 100 mL/hr at 06/27/20 0400   potassium PHOSPHATE IVPB (in mmol) Stopped (06/24/20 2342)   prismasol BGK 2/2.5 dialysis solution 2,000 mL/hr at 06/26/20 1016   propofol (DIPRIVAN) infusion Stopped (06/25/20 0619)    amLODipine  10 mg Per Tube Daily   artificial tears  1 application Both Eyes Z0C   B-complex with vitamin C  1 tablet Per Tube Daily   calcitRIOL  0.25 mcg Per Tube Once per day on Tue Thu Sat   carvedilol  12.5 mg Per Tube BID   chlorhexidine gluconate (MEDLINE KIT)  15 mL Mouth Rinse BID   Chlorhexidine Gluconate Cloth  6 each Topical Daily   Chlorhexidine Gluconate Cloth  6 each Topical Q0600   darbepoetin (ARANESP) injection - DIALYSIS  60 mcg Subcutaneous Weekly   docusate  100 mg Per Tube BID   feeding supplement (PROSource TF)  90 mL Per Tube TID   feeding supplement (VITAL 1.5 CAL)  1,000 mL Per Tube Q24H   heparin injection (subcutaneous)  5,000 Units Subcutaneous Q8H   hydrALAZINE  50 mg Per Tube Q8H   mouth rinse  15 mL Mouth Rinse 10 times per day   mupirocin ointment  1 application Nasal BID   polyethylene glycol  17 g Per Tube Daily   scopolamine  1 patch Transdermal Q72H   sodium chloride flush  10-40 mL Intracatheter Q12H   sodium chloride flush  5 mL Intracatheter Q8H   valproic acid  250 mg Per Tube TID    Dialysis Orders: TTS [place] [facility])  4h 71min   70.5kg  400/500   2/2.25 bath  TDC  Hep 1000  - calc 0.40mcg PO q HD  - mircera 50 q4 last 5/31  - venofer 50 /wk  -  sensipar $RemoveB'120mg'vXbzUxKW$  PO q HD  Assessment/Plan: S/p cardiac arrest, prolonged time until ROSC 06/22/20.   Like severe anoxic brain injury, neuro following Acute cholecysttis, CCS following; s/p perc drain 6/21; cefepime flagyl ESRD: R IJ TDC. [place].   CRRT from 6/21-6/24 , planning on doing IHD treatment today via [facility], it might not be done until tonight   Hypotension post #1 now off pressors and with compensatory hypertension Anemia: Hgb stable, even improved - no meds  CKD-BMD Ca ok,   Mild/mod hyponatremia: resolved  Louis Meckel, MD  [place] 06/27/2020,7:32 AM  LOS: 7 days

## 2020-06-28 ENCOUNTER — Inpatient Hospital Stay (HOSPITAL_COMMUNITY): Payer: Medicare Other

## 2020-06-28 DIAGNOSIS — R6520 Severe sepsis without septic shock: Secondary | ICD-10-CM

## 2020-06-28 DIAGNOSIS — A419 Sepsis, unspecified organism: Principal | ICD-10-CM

## 2020-06-28 LAB — RENAL FUNCTION PANEL
Albumin: 2.1 g/dL — ABNORMAL LOW (ref 3.5–5.0)
Anion gap: 12 (ref 5–15)
BUN: 93 mg/dL — ABNORMAL HIGH (ref 8–23)
CO2: 22 mmol/L (ref 22–32)
Calcium: 8.5 mg/dL — ABNORMAL LOW (ref 8.9–10.3)
Chloride: 103 mmol/L (ref 98–111)
Creatinine, Ser: 4.52 mg/dL — ABNORMAL HIGH (ref 0.61–1.24)
GFR, Estimated: 14 mL/min — ABNORMAL LOW (ref >60)
Glucose, Bld: 93 mg/dL (ref 70–99)
Phosphorus: 5.1 mg/dL — ABNORMAL HIGH (ref 2.5–4.6)
Potassium: 4.1 mmol/L (ref 3.5–5.1)
Sodium: 137 mmol/L (ref 135–145)

## 2020-06-28 LAB — GLUCOSE, CAPILLARY
Glucose-Capillary: 100 mg/dL — ABNORMAL HIGH (ref 70–99)
Glucose-Capillary: 126 mg/dL — ABNORMAL HIGH (ref 70–99)
Glucose-Capillary: 139 mg/dL — ABNORMAL HIGH (ref 70–99)
Glucose-Capillary: 151 mg/dL — ABNORMAL HIGH (ref 70–99)
Glucose-Capillary: 161 mg/dL — ABNORMAL HIGH (ref 70–99)
Glucose-Capillary: 166 mg/dL — ABNORMAL HIGH (ref 70–99)
Glucose-Capillary: 167 mg/dL — ABNORMAL HIGH (ref 70–99)
Glucose-Capillary: 223 mg/dL — ABNORMAL HIGH (ref 70–99)
Glucose-Capillary: 252 mg/dL — ABNORMAL HIGH (ref 70–99)
Glucose-Capillary: 32 mg/dL — CL (ref 70–99)
Glucose-Capillary: 90 mg/dL (ref 70–99)
Glucose-Capillary: 91 mg/dL (ref 70–99)
Glucose-Capillary: 98 mg/dL (ref 70–99)

## 2020-06-28 LAB — CBC
HCT: 25.1 % — ABNORMAL LOW (ref 39.0–52.0)
Hemoglobin: 8.2 g/dL — ABNORMAL LOW (ref 13.0–17.0)
MCH: 31.5 pg (ref 26.0–34.0)
MCHC: 32.7 g/dL (ref 30.0–36.0)
MCV: 96.5 fL (ref 80.0–100.0)
Platelets: 322 10*3/uL (ref 150–400)
RBC: 2.6 MIL/uL — ABNORMAL LOW (ref 4.22–5.81)
RDW: 15.4 % (ref 11.5–15.5)
WBC: 40.4 10*3/uL — ABNORMAL HIGH (ref 4.0–10.5)
nRBC: 2.1 % — ABNORMAL HIGH (ref 0.0–0.2)

## 2020-06-28 LAB — MAGNESIUM: Magnesium: 2.7 mg/dL — ABNORMAL HIGH (ref 1.7–2.4)

## 2020-06-28 MED ORDER — VANCOMYCIN HCL 750 MG/150ML IV SOLN
750.0000 mg | INTRAVENOUS | Status: DC
  Filled 2020-06-28: qty 150

## 2020-06-28 MED ORDER — VITAL 1.5 CAL PO LIQD
1000.0000 mL | ORAL | Status: DC
  Administered 2020-06-28 – 2020-07-02 (×5): 1000 mL

## 2020-06-28 NOTE — Progress Notes (Signed)
[facility] Progress Note   Subjective:    Did IHD yest-  only able to remove 500 due to hypotension-  pt febrile overnight as well -  MRI shows severe anoxic brain injury and 3 mm of tonsillar herniation-  family aware but not ready to make any decisions   Objective Vitals:   06/28/20 0500 06/28/20 0520 06/28/20 0600 06/28/20 0737  BP: (!) 104/53 116/62 136/66   Pulse: 83  89 90  Resp: 14  (!) 22 14  Temp: 100.22 F (37.9 C)  (!) 100.58 F (38.1 C) (!) 102.2 F (39 C)  TempSrc:      SpO2: 100%  100% 100%  Weight: 79.4 kg     Height:       Physical Exam General: intubated, sedated Heart: Regular Lungs: Coarse bs b/l Abdomen: Round, distended but soft, active bowel sounds Extremities: pitting edema bilateral lower extremities Dialysis Access: [facility] R chest  NEURO: encephalopathic  Filed Weights   06/27/20 0941 06/27/20 1310 06/28/20 0500  Weight: 81 kg 80.5 kg 79.4 kg    Intake/Output Summary (Last 24 hours) at 06/28/2020 0737 Last data filed at 06/28/2020 0600 Gross per 24 hour  Intake 1813.07 ml  Output 970 ml  Net 843.07 ml    Additional Objective Labs: Basic Metabolic Panel: Recent Labs  Lab 06/27/20 0445 06/27/20 1622 06/28/20 0430  NA 137 138 137  K 3.8 4.4 4.1  CL 103 101 103  CO2 _0 GLUCOSE 229* 216* 93  BUN 86* 53* 93*  CREATININE 4.36* 3.20* 4.52*  CALCIUM 8.4* 8.3* 8.5*  PHOS 4.2 3.6 5.1*   Liver Function Tests: Recent Labs  Lab 06/25/20 0352 06/25/20 1712 06/26/20 0334 06/26/20 1718 06/27/20 0445 06/27/20 1622 06/28/20 0430  AST 89*  --  64*  --  50*  --   --   ALT 179*  --  122*  --  77*  --   --   ALKPHOS 163*  --  201*  --  157*  --   --   BILITOT 0.5  --  0.8  --  0.3  --   --   PROT 6.2*  --  6.0*  --  5.7*  --   --   ALBUMIN 2.6*   < > 2.5*   < > 2.3* 2.3* 2.1*   < > = values in this interval not displayed.   Recent Labs  Lab 06/22/20 0103  LIPASE 40   CBC: Recent Labs  Lab 06/22/20 2151  06/22/20 2233 06/24/20 0316 06/25/20 0352 06/26/20 0334 06/26/20 0336 06/26/20 0621 06/27/20 0445 06/28/20 0430  WBC 29.3*   < > 22.6* 25.8* 32.5*  --   --  43.2* 40.4*  NEUTROABS 22.4*  --   --   --   --   --   --   --   --   HGB 8.5*   < > 8.3* 8.7* 9.2*   < > 9.5* 9.5* 8.2*  HCT 26.5*   < > 24.1* 26.6* 27.6*   < > 28.0* 28.5* 25.1*  MCV 97.8   < > 90.3 94.7 92.9  --   --  96.0 96.5  PLT 194   < > 289 311 297  --   --  339 322   < > = values in this interval not displayed.   Blood Culture    Component Value Date/Time   SDES BLOOD RIGHT ANTECUBITAL 12/09/2019 0842   SPECREQUEST  12/09/2019  0842    BOTTLES DRAWN AEROBIC AND ANAEROBIC Blood Culture adequate volume   CULT  12/09/2019 0842    NO GROWTH 5 DAYS Performed at [facility], [address] [place], [place] [address]    REPTSTATUS 12/14/2019 FINAL 12/09/2019 0842    Cardiac Enzymes: No results for input(s): CKTOTAL, CKMB, CKMBINDEX, TROPONINI in the last 168 hours. CBG: Recent Labs  Lab 06/27/20 1401 06/27/20 1639 06/27/20 1942 06/28/20 0005 06/28/20 0338  GLUCAP 151* 139* 215* 252* 223*   Iron Studies: No results for input(s): IRON, TIBC, TRANSFERRIN, FERRITIN in the last 72 hours. Lab Results  Component Value Date   INR 1.1 12/05/2019   INR 1.1 05/12/2019   Studies/Results: MR BRAIN WO CONTRAST  Result Date: 06/27/2020 CLINICAL DATA:  Anoxic brain damage EXAM: MRI HEAD WITHOUT CONTRAST TECHNIQUE: Multiplanar, multiecho pulse sequences of the brain and surrounding structures were obtained without intravenous contrast. COMPARISON:  CT head June 24, 2020. FINDINGS: Brain: Edema and restricted diffusion involving the infratentorial and supratentorial cortex and bilateral deep gray nuclei, compatible with severe hypoxic/ischemic injury. Ischemia/Restricted diffusion is most conspicuous in the bilateral parietal and occipital cortex and cerebellum. Resulting diffuse sulcal effacement with crowding at  the basal cisterns, posterior fossa and foramen magnum with approximately 3 mm of downward cerebellar tonsillar herniation. No substantial change in 11 mm thick right and trace left cerebral convexity hemorrhage. Similar mass effect. No hydrocephalus. No midline shift. No mass lesion. Vascular: Major arterial flow voids are maintained at the skull base. Skull and upper cervical spine: Normal marrow signal. Sinuses/Orbits: Moderate paranasal sinus mucosal thickening. Unremarkable orbits. Other: Large bilateral mastoid effusions. IMPRESSION: 1. Diffuse edema and restricted diffusion involving the infratentorial and supratentorial cortex and bilateral deep gray nuclei, compatible with severe hypoxic/ischemic injury. Resulting diffuse sulcal effacement with crowding of the posterior fossa and approximately 3 mm of downward cerebellar tonsillar herniation. 2. No substantial change in 11 mm thick right and trace left cerebral convexity hemorrhage with similar mass effect. These results will be called to the ordering clinician or representative by the Radiologist Assistant, and communication documented in the PACS or [facility] Dashboard. Electronically Signed   By: Frederick S Jones MD   On: 06/27/2020 16:54    Medications:  ceFEPime (MAXIPIME) IV Stopped (06/27/20 1855)   dextrose     insulin 3.2 Units/hr (06/27/20 0900)   levETIRAcetam 10 mL/hr at 06/28/20 0400   metronidazole Stopped (06/28/20 0258)    amLODipine  10 mg Per Tube Daily   artificial tears  1 application Both Eyes Q8H   B-complex with vitamin C  1 tablet Per Tube Daily   calcitRIOL  0.25 mcg Per Tube Once per day on Tue Thu Sat   carvedilol  12.5 mg Per Tube BID   chlorhexidine gluconate (MEDLINE KIT)  15 mL Mouth Rinse BID   Chlorhexidine Gluconate Cloth  6 each Topical Daily   Chlorhexidine Gluconate Cloth  6 each Topical Q0600   darbepoetin (ARANESP) injection - DIALYSIS  60 mcg Subcutaneous Weekly   docusate  100 mg Per Tube BID    feeding supplement (PROSource TF)  90 mL Per Tube TID   feeding supplement (VITAL 1.5 CAL)  1,000 mL Per Tube Q24H   heparin injection (subcutaneous)  5,000 Units Subcutaneous Q8H   hydrALAZINE  50 mg Per Tube Q8H   insulin aspart  3-9 Units Subcutaneous Q4H   insulin aspart  7 Units Subcutaneous Q4H   insulin detemir  20 Units Subcutaneous Q12H     mouth rinse  15 mL Mouth Rinse 10 times per day   mupirocin ointment  1 application Nasal BID   polyethylene glycol  17 g Per Tube Daily   scopolamine  1 patch Transdermal Q72H   sodium chloride flush  10-40 mL Intracatheter Q12H   sodium chloride flush  5 mL Intracatheter Q8H   valproic acid  250 mg Per Tube TID   vancomycin variable dose per unstable renal function (pharmacist dosing)   Does not apply See admin instructions    Dialysis Orders: TTS [place] [facility])  4h 52mn   70.5kg  400/500   2/2.25 bath  TDC  Hep 1000  - calc 0.233m PO q HD  - mircera 50 q4 last 5/31  - venofer 50 /wk  - sensipar 1207mO q HD  Assessment/Plan: S/p cardiac arrest, prolonged time until ROSC 06/22/20.   Like severe anoxic brain injury, MRI confirms and is bad Acute cholecysttis, CCS following; s/p perc drain 6/21; cefepime flagyl ESRD: R IJ TDC. [place] , IHD 6/25- BUN already 93 again this AM-  will need HD tomorrow as is catabolic-  then attempt every other day   Hypotension post #1 now off pressors and with compensatory hypertension Anemia: Hgb stable,  no meds  CKD-BMD Ca ok,   Mild/mod hyponatremia: resolved  KelLouis MeckelD  [place]26/2022,7:37 AM  LOS: 8 days

## 2020-06-28 NOTE — Progress Notes (Signed)
NAME:  Daniel Thomas, MRN:  [id number], DOB:  [birth year], LOS: 8 ADMISSION DATE:  06/11/2020, CONSULTATION DATE:  6/20 REFERRING MD:  Dr. Thereasa Solo, CHIEF COMPLAINT:  N/V; Post Cardiac Arrest   History of Present Illness:  Patient is a 65 yo M with pertinent PMH of COPD, ESRD on dialysis, DM, Heart murmur, HTN, and hyperlipidemia presenting to [facility] on 6/18 with N/V.  Patient arrived to [facility] on 6/18 stating his N/V began (6/17) during the night. He has not missed any dialysis treatments except for today. Pertinent ED labs show Creatinine of 9.6, Lipase 108. Nephrology consulted for ESRD. RUQ ultrasound show cholithiasis and patient has positive Murphy's sign. Surgery consulted. Patient declined surgery and perc tube placement at this time.   On 6/20, patient underwent CT abdomen with IV/oral contrast to evaluate gallbladder. While in CT patient lost pulse and cardiac arrested for 30 minutes before ROSC. Patient tongue appeared swollen and required intubation with a 6.5 ET tube due to the swelling. Epi, steroids, calcium, mag, and bicarb were given. One shock was given during one intermission with V-fib on rhythm check.   PCCM consulted on 6/20 for post cardiac arrest care and mechanical ventilator/airway management.  Pertinent  Medical History   Past Medical History:  Diagnosis Date   COPD (chronic obstructive pulmonary disease) ([place])    Diabetes mellitus without complication ([place])    Enlarged prostate    ESRD on dialysis ([place])    Heart murmur    High cholesterol    Hypertension     Significant Hospital Events: Including procedures, antibiotic start and stop dates in addition to other pertinent events   6/18: admitted to [facility] with symptomatic cholelithiasis 6/20: CT abdomen with oral contrast ordered; patient cardiac arrested x30 minutes until ROSC and appeared anaphylactic from contrast with swollen tongue. Intubated with 6.5 ETT. Admitted to ICU. Patient went bradycardic and cardiac  arrested again and given 1 atropine, 1 epi, and bicarb given; ROSC after 2 mintues. Myoclonic activity: EEG pending. 6/21: Initiated on CRRT.  Percutaneous cholecystostomy 6/22 CT H  with signs of anoxic/hypoxic injury  6/23 discussed neuro prognostication with family. CCS plan to s/o  6/24 Best exam is extensor response to suctioning, insulin drip for hyperglycemia 6/25 insulin drip discontinued, MRI  Interim History / Subjective:   Slight drop in blood pressure during dialysis yesterday Febrile 101 Episode of vomiting overnight, tube feeds held  Objective   Blood pressure 136/66, pulse 90, temperature (!) 102.2 F (39 C), resp. rate 14, height 5\' 2"  (1.575 m), weight 79.4 kg, SpO2 100 %.    Vent Mode: PRVC FiO2 (%):  [30 %-40 %] 40 % Set Rate:  [12 bmp] 12 bmp Vt Set:  [550 mL] 550 mL PEEP:  [5 cmH20] 5 cmH20 Plateau Pressure:  [16 cmH20-24 cmH20] 19 cmH20   Intake/Output Summary (Last 24 hours) at 06/28/2020 0815 Last data filed at 06/28/2020 0600 Gross per 24 hour  Intake 1758.07 ml  Output 970 ml  Net 788.07 ml    Filed Weights   06/27/20 0941 06/27/20 1310 06/28/20 0500  Weight: 81 kg 80.5 kg 79.4 kg    Examination: General: Chronically and critically ill M, intubated, off sedation NAD  Neuro: No response to deep painful stimulus, pupils 3 mm not reactive to light, midline gaze, doll's eye present, spontaneous respiration present, not able to elicit bronchitis HEENT: NCAT pink mm ETT secure anicteric sclera  Cardiovascular: rrr s1s2 cap refill < 3 seconds  Lungs: Bilateral  air entry present, no accessory muscle use Abdomen: Per chole tube C/D/I with minimal bilious output.  BS x 4, soft, NT/ND.  Musculoskeletal: No gross deformities, no edema.  Skin: Intact, warm, no rashes.   Chest x-ray 6/25 independently reviewed , clearing of infiltrates  Resolved Hospital Problem list     Assessment & Plan:   Anoxic Encephalopathy  Subcortical myoclonus -resolved MRI  6/25 shows diffuse edema, 3 mm cerebellar tonsillar herniation , no change in left subdural  -Prognosis for meaningful neurological recovery given to family both by neurology and me -Continuing Keppra and Depakote  Acute respiratory failure with hypoxia  Hx COPD  -Start spontaneous breathing trials, but not a candidate for extubation unless in comfort mode   S/p Cardiac arrest with ROSC  Echo shows normal LVEF and normal RV function, elevated LVEDP   Sepsis due to acute cholecystitis s/p perc cholecystostomy with IR 6/21 Fever with increasing leukocytosis -Cefepime and Flagyl, plan for 7 days total - 6/25 Added vancomycin empiric, await blood cultures, DC femoral line   ESRD Anemia of chronic renal disease -Some hypotension with intermittent dialysis noted 6/25, BUN rising quickly from 50-90 postdialysis indicating catabolic and will need dialysis again 6/26 - aranesp per renal   HTN -Resumed amlodipine and Coreg, can hold on dialysis days  Dm2, uncontrolled hyperglycemia  -Transitioned off insulin drip per phase 3 protocol on 6/25 ,  Levemir 20 units every 12 with 7 units every 4 hours for TF coverage Will not change today since tube feeds held overnight Restart trickle  Goals of care -Aarsh has 4 brothers, [name] seems most involved and was caregiver for the past year.  Poor prognosis for meaningful neurological recovery was given to him.  Concept of brain herniation and the difference between anoxic encephalopathy and brain death was explained.  He states "family is not ready to make any decisions".  He is not receptive to palliative care involvement at this time.  From previous notes there seems to be some mistrust of the health system.  Given severity of anoxic damage on MRI, there is a possibility that this may progress to brain death.   Best Practice (right click and "Reselect all SmartList Selections" daily)   Diet/type: NPO Pain/Anxiety/Delirium protocol RASS goal:  0  no sedation VAP protocol (if indicated): Yes DVT prophylaxis: LMWH GI prophylaxis: H2B Glucose control:  SSI Central venous access:  removal ordered  Arterial line:  removal ordered  Foley:  N/A Mobility:  bed rest  PT consulted: N/A  Code Status:  full code Last date of multidisciplinary goals of care discussion 6/23  Disposition: remains critically ill, will stay in intensive care   CRITICAL CARE Performed by: Leanna Sato Kirby Argueta   Total critical care time:  34 minutes  Critical care time was exclusive of separately billable procedures and treating other patients. Critical care was necessary to treat or prevent imminent or life-threatening deterioration.  Critical care was time spent personally by me on the following activities: development of treatment plan with patient and/or surrogate as well as nursing, discussions with consultants, evaluation of patient's response to treatment, examination of patient, obtaining history from patient or surrogate, ordering and performing treatments and interventions, ordering and review of laboratory studies, ordering and review of radiographic studies, pulse oximetry and re-evaluation of patient's condition.   Kara Mead MD. [place] Flood. [facility] Pager : [phone]  If no response to pager , please call [phone] until 7 pm After 7:00  pm call Elink  [phone]     06/28/2020, 8:15 AM

## 2020-06-28 NOTE — Plan of Care (Signed)
  Problem: Education: Goal: Knowledge of General Education information will improve Description: Including pain rating scale, medication(s)/side effects and non-pharmacologic comfort measures Outcome: Not Progressing   Problem: Health Behavior/Discharge Planning: Goal: Ability to manage health-related needs will improve Outcome: Not Progressing   Problem: Clinical Measurements: Goal: Ability to maintain clinical measurements within normal limits will improve Outcome: Not Progressing Goal: Will remain free from infection Outcome: Not Progressing Goal: Diagnostic test results will improve Outcome: Not Progressing Goal: Respiratory complications will improve Outcome: Not Progressing Goal: Cardiovascular complication will be avoided Outcome: Not Progressing   Problem: Activity: Goal: Risk for activity intolerance will decrease Outcome: Not Progressing   Problem: Nutrition: Goal: Adequate nutrition will be maintained Outcome: Not Progressing   Problem: Coping: Goal: Level of anxiety will decrease Outcome: Not Progressing   Problem: Elimination: Goal: Will not experience complications related to bowel motility Outcome: Not Progressing   Problem: Pain Managment: Goal: General experience of comfort will improve Outcome: Not Progressing   Problem: Safety: Goal: Ability to remain free from injury will improve Outcome: Not Progressing   Problem: Skin Integrity: Goal: Risk for impaired skin integrity will decrease Outcome: Not Progressing   Problem: Education: Goal: Knowledge of disease and its progression will improve Outcome: Not Progressing Goal: Individualized Educational Video(s) Outcome: Not Progressing   Problem: Fluid Volume: Goal: Compliance with measures to maintain balanced fluid volume will improve Outcome: Not Progressing   Problem: Health Behavior/Discharge Planning: Goal: Ability to manage health-related needs will improve Outcome: Not Progressing    Problem: Nutritional: Goal: Ability to make healthy dietary choices will improve Outcome: Not Progressing   Problem: Clinical Measurements: Goal: Complications related to the disease process, condition or treatment will be avoided or minimized Outcome: Not Progressing

## 2020-06-29 ENCOUNTER — Inpatient Hospital Stay (HOSPITAL_COMMUNITY): Payer: Medicare Other

## 2020-06-29 DIAGNOSIS — Z7189 Other specified counseling: Secondary | ICD-10-CM

## 2020-06-29 DIAGNOSIS — T886XXA Anaphylactic reaction due to adverse effect of correct drug or medicament properly administered, initial encounter: Secondary | ICD-10-CM

## 2020-06-29 LAB — RENAL FUNCTION PANEL
Albumin: 2.1 g/dL — ABNORMAL LOW (ref 3.5–5.0)
Albumin: 1.9 g/dL — ABNORMAL LOW (ref 3.5–5.0)
Anion gap: 19 — ABNORMAL HIGH (ref 5–15)
Anion gap: 20 — ABNORMAL HIGH (ref 5–15)
BUN: 152 mg/dL — ABNORMAL HIGH (ref 8–23)
BUN: 166 mg/dL — ABNORMAL HIGH (ref 8–23)
CO2: 15 mmol/L — ABNORMAL LOW (ref 22–32)
CO2: 14 mmol/L — ABNORMAL LOW (ref 22–32)
Calcium: 8.3 mg/dL — ABNORMAL LOW (ref 8.9–10.3)
Calcium: 8 mg/dL — ABNORMAL LOW (ref 8.9–10.3)
Chloride: 100 mmol/L (ref 98–111)
Chloride: 102 mmol/L (ref 98–111)
Creatinine, Ser: 7.03 mg/dL — ABNORMAL HIGH (ref 0.61–1.24)
Creatinine, Ser: 7.46 mg/dL — ABNORMAL HIGH (ref 0.61–1.24)
GFR, Estimated: 8 mL/min — ABNORMAL LOW (ref >60)
GFR, Estimated: 8 mL/min — ABNORMAL LOW (ref >60)
Glucose, Bld: 131 mg/dL — ABNORMAL HIGH (ref 70–99)
Glucose, Bld: 156 mg/dL — ABNORMAL HIGH (ref 70–99)
Phosphorus: 7.7 mg/dL — ABNORMAL HIGH (ref 2.5–4.6)
Phosphorus: 7.8 mg/dL — ABNORMAL HIGH (ref 2.5–4.6)
Potassium: 4.7 mmol/L (ref 3.5–5.1)
Potassium: 4.8 mmol/L (ref 3.5–5.1)
Sodium: 134 mmol/L — ABNORMAL LOW (ref 135–145)
Sodium: 136 mmol/L (ref 135–145)

## 2020-06-29 LAB — GLUCOSE, CAPILLARY
Glucose-Capillary: 128 mg/dL — ABNORMAL HIGH (ref 70–99)
Glucose-Capillary: 141 mg/dL — ABNORMAL HIGH (ref 70–99)
Glucose-Capillary: 174 mg/dL — ABNORMAL HIGH (ref 70–99)
Glucose-Capillary: 128 mg/dL — ABNORMAL HIGH (ref 70–99)
Glucose-Capillary: 140 mg/dL — ABNORMAL HIGH (ref 70–99)
Glucose-Capillary: 162 mg/dL — ABNORMAL HIGH (ref 70–99)

## 2020-06-29 LAB — CBC
HCT: 25.5 % — ABNORMAL LOW (ref 39.0–52.0)
Hemoglobin: 8.4 g/dL — ABNORMAL LOW (ref 13.0–17.0)
MCH: 31.3 pg (ref 26.0–34.0)
MCHC: 32.9 g/dL (ref 30.0–36.0)
MCV: 95.1 fL (ref 80.0–100.0)
Platelets: 380 10*3/uL (ref 150–400)
RBC: 2.68 MIL/uL — ABNORMAL LOW (ref 4.22–5.81)
RDW: 15.8 % — ABNORMAL HIGH (ref 11.5–15.5)
WBC: 38.8 10*3/uL — ABNORMAL HIGH (ref 4.0–10.5)
nRBC: 0.5 % — ABNORMAL HIGH (ref 0.0–0.2)

## 2020-06-29 LAB — MAGNESIUM: Magnesium: 3.2 mg/dL — ABNORMAL HIGH (ref 1.7–2.4)

## 2020-06-29 MED ORDER — PROPOFOL 1000 MG/100ML IV EMUL
5.0000 ug/kg/min | INTRAVENOUS | Status: DC
  Administered 2020-06-29: 5 ug/kg/min via INTRAVENOUS
  Filled 2020-06-29 (×2): qty 100

## 2020-06-29 MED ORDER — PANTOPRAZOLE SODIUM 40 MG IV SOLR
40.0000 mg | Freq: Every day | INTRAVENOUS | Status: DC
  Administered 2020-06-29: 40 mg via INTRAVENOUS
  Filled 2020-06-29: qty 40

## 2020-06-29 MED ORDER — VANCOMYCIN HCL 750 MG/150ML IV SOLN
750.0000 mg | INTRAVENOUS | Status: DC
  Filled 2020-06-29 (×2): qty 150

## 2020-06-29 NOTE — Progress Notes (Signed)
Referring Physician(s): Wilmer Floor PA  Supervising Physician: Dr. Serafina Royals  Patient Status:  [facility] - In-pt  Chief Complaint:  Symptomatic cholelithiasis with possible acute cholecystitis s/p cholecystomy tube on 6/21 No family present this am. Per notes, poor prognosis, palliative consult soon. Subjective:   Unable to assess. Patient intubated and sedated.   Allergies: Contrast media [iodinated diagnostic agents] and Tape  Medications:  Current Facility-Administered Medications:    acetaminophen (TYLENOL) tablet 650 mg, 650 mg, Per Tube, Q6H PRN, 650 mg at 06/28/20 0743 **OR** [DISCONTINUED] acetaminophen (TYLENOL) suppository 650 mg, 650 mg, Rectal, Q6H PRN, Clarnce Flock, MD   amLODipine (NORVASC) tablet 10 mg, 10 mg, Per Tube, Daily, Agarwala, Ravi, MD, 10 mg at 06/29/20 7408   artificial tears (LACRILUBE) ophthalmic ointment 1 application, 1 application, Both Eyes, Q8H, Agarwala, Ravi, MD, 1 application at 14/48/18 0518   B-complex with vitamin C tablet 1 tablet, 1 tablet, Per Tube, Daily, Desai, Rahul P, PA-C, 1 tablet at 06/29/20 5631   calcitRIOL (ROCALTROL) 1 MCG/ML solution 0.25 mcg, 0.25 mcg, Per Tube, Once per day on Tue Thu Sat, Bitonti, Michael T, RPH, 0.25 mcg at 06/27/20 1446   carvedilol (COREG) tablet 12.5 mg, 12.5 mg, Per Tube, BID, Bowser, Grace E, NP, 12.5 mg at 06/29/20 4970   ceFEPIme (MAXIPIME) 2 g in sodium chloride 0.9 % 100 mL IVPB, 2 g, Intravenous, Q24H, Agarwala, Ravi, MD, Stopped at 06/28/20 1717   chlorhexidine gluconate (MEDLINE KIT) (PERIDEX) 0.12 % solution 15 mL, 15 mL, Mouth Rinse, BID, Agarwala, Ravi, MD, 15 mL at 06/29/20 0757   Chlorhexidine Gluconate Cloth 2 % PADS 6 each, 6 each, Topical, Daily, Agarwala, Ravi, MD, 6 each at 06/29/20 2637   Darbepoetin Alfa (ARANESP) injection 60 mcg, 60 mcg, Subcutaneous, Weekly, Einar Grad, RPH, 60 mcg at 06/23/20 1612   dextrose 10 % infusion, , Intravenous, Continuous PRN, Rigoberto Noel,  MD   docusate (COLACE) 50 MG/5ML liquid 100 mg, 100 mg, Per Tube, BID, Andres Labrum D, PA-C, 100 mg at 06/29/20 8588   feeding supplement (PROSource TF) liquid 90 mL, 90 mL, Per Tube, TID, Desai, Rahul P, PA-C, 90 mL at 06/29/20 0928   feeding supplement (VITAL 1.5 CAL) liquid 1,000 mL, 1,000 mL, Per Tube, Q24H, Rigoberto Noel, MD, Last Rate: 20 mL/hr at 06/29/20 0600, Infusion Verify at 06/29/20 0600   fentaNYL (SUBLIMAZE) injection 50 mcg, 50 mcg, Intravenous, Q15 min PRN, Mick Sell, PA-C, 50 mcg at 06/25/20 2337   fentaNYL (SUBLIMAZE) injection 50-200 mcg, 50-200 mcg, Intravenous, Q30 min PRN, Mick Sell, PA-C, 50 mcg at 06/27/20 2353   heparin injection 5,000 Units, 5,000 Units, Subcutaneous, Q8H, Einar Grad, RPH, 5,000 Units at 06/29/20 0515   hydrALAZINE (APRESOLINE) injection 10 mg, 10 mg, Intravenous, Q4H PRN, Anders Simmonds, MD, 10 mg at 06/26/20 2046   hydrALAZINE (APRESOLINE) tablet 50 mg, 50 mg, Per Tube, Q8H, Agarwala, Ravi, MD, 50 mg at 06/29/20 0558   insulin aspart (novoLOG) injection 3-9 Units, 3-9 Units, Subcutaneous, Q4H, Rigoberto Noel, MD, 3 Units at 06/29/20 0756   insulin aspart (novoLOG) injection 7 Units, 7 Units, Subcutaneous, Q4H, Rigoberto Noel, MD, 7 Units at 06/28/20 0007   insulin detemir (LEVEMIR) injection 20 Units, 20 Units, Subcutaneous, Q12H, Rigoberto Noel, MD, 20 Units at 06/29/20 0931   ipratropium-albuterol (DUONEB) 0.5-2.5 (3) MG/3ML nebulizer solution 3 mL, 3 mL, Nebulization, Q4H PRN, Andres Labrum D, PA-C   levETIRAcetam (KEPPRA) IVPB 1000  mg/100 mL premix, 1,000 mg, Intravenous, Q12H, Agarwala, Einar Grad, MD, Last Rate: 400 mL/hr at 06/29/20 0213, 1,000 mg at 06/29/20 0213   MEDLINE mouth rinse, 15 mL, Mouth Rinse, 10 times per day, Kipp Brood, MD, 15 mL at 06/29/20 0929   metroNIDAZOLE (FLAGYL) IVPB 500 mg, 500 mg, Intravenous, Q8H, Agarwala, Ravi, MD, Last Rate: 100 mL/hr at 06/29/20 0943, 500 mg at 06/29/20 0943   ondansetron (ZOFRAN)  tablet 4 mg, 4 mg, Per Tube, Q6H PRN **OR** ondansetron (ZOFRAN) injection 4 mg, 4 mg, Intravenous, Q6H PRN, Einar Grad, RPH, 4 mg at 06/27/20 2343   pantoprazole (PROTONIX) injection 40 mg, 40 mg, Intravenous, QHS, Candee Furbish, MD   polyethylene glycol (MIRALAX / GLYCOLAX) packet 17 g, 17 g, Per Tube, Daily PRN, Einar Grad, RPH   scopolamine (TRANSDERM-SCOP) 1 MG/3DAYS 1.5 mg, 1 patch, Transdermal, Q72H, Agarwala, Ravi, MD, 1.5 mg at 06/29/20 0930   sodium chloride flush (NS) 0.9 % injection 10-40 mL, 10-40 mL, Intracatheter, Q12H, Agarwala, Ravi, MD, 10 mL at 06/29/20 0930   sodium chloride flush (NS) 0.9 % injection 10-40 mL, 10-40 mL, Intracatheter, PRN, Kipp Brood, MD, 10 mL at 06/25/20 2214   sodium chloride flush (NS) 0.9 % injection 5 mL, 5 mL, Intracatheter, Q8H, Sandi Mariscal, MD, 5 mL at 06/29/20 1610   valproic acid (DEPAKENE) 250 MG/5ML solution 250 mg, 250 mg, Per Tube, TID, Kipp Brood, MD, 250 mg at 06/29/20 0928   vancomycin (VANCOREADY) IVPB 750 mg/150 mL, 750 mg, Intravenous, Q M,W,F-HD, Simonne Maffucci B, MD    Vital Signs: BP (!) 123/56 (BP Location: Left Arm)   Pulse 86   Temp 99.32 F (37.4 C) (Oral)   Resp (!) 21   Ht 5' 2" (1.575 m)   Wt 80.4 kg   SpO2 100%   BMI 32.42 kg/m   Physical Exam Vitals and nursing note reviewed.  Constitutional:      Appearance: He is well-developed.  HENT:     Head: Normocephalic.  Cardiovascular:     Rate and Rhythm: Normal rate and regular rhythm.  Pulmonary:     Comments: Vented on sesdation Abdominal:     Comments: RUQ drain to gravity bag. Site is unremarkable with no erythema, edema, tenderness, bleeding or drainage noted at exit site. Thin bilious output  Musculoskeletal:     Cervical back: Normal range of motion.  Skin:    General: Skin is warm and dry.  Neurological:     Comments: Unable to assess. Patient sedated.     Imaging: DG Abd 1 View  Result Date: 06/29/2020 CLINICAL DATA:   Abdominal pain EXAM: ABDOMEN - 1 VIEW COMPARISON:  June 22, 2020 FINDINGS: Nasogastric tube tip and side port in stomach. There is fairly mild stool volume in the colon. There is no bowel dilatation or air-fluid level to suggest bowel obstruction. No free air. Note that there is a pigtail drainage catheter in the right lateral abdomen. There are apparent phleboliths in the pelvis. IMPRESSION: Nasogastric tube tip and side port in stomach. Apparent drainage catheter in lateral right abdomen. No bowel obstruction or free air evident. Electronically Signed   By: Lowella Grip III M.D.   On: 06/29/2020 08:39   MR BRAIN WO CONTRAST  Result Date: 06/27/2020 CLINICAL DATA:  Anoxic brain damage EXAM: MRI HEAD WITHOUT CONTRAST TECHNIQUE: Multiplanar, multiecho pulse sequences of the brain and surrounding structures were obtained without intravenous contrast. COMPARISON:  CT head June 24, 2020. FINDINGS: Brain: Edema  and restricted diffusion involving the infratentorial and supratentorial cortex and bilateral deep gray nuclei, compatible with severe hypoxic/ischemic injury. Ischemia/Restricted diffusion is most conspicuous in the bilateral parietal and occipital cortex and cerebellum. Resulting diffuse sulcal effacement with crowding at the basal cisterns, posterior fossa and foramen magnum with approximately 3 mm of downward cerebellar tonsillar herniation. No substantial change in 11 mm thick right and trace left cerebral convexity hemorrhage. Similar mass effect. No hydrocephalus. No midline shift. No mass lesion. Vascular: Major arterial flow voids are maintained at the skull base. Skull and upper cervical spine: Normal marrow signal. Sinuses/Orbits: Moderate paranasal sinus mucosal thickening. Unremarkable orbits. Other: Large bilateral mastoid effusions. IMPRESSION: 1. Diffuse edema and restricted diffusion involving the infratentorial and supratentorial cortex and bilateral deep gray nuclei, compatible with  severe hypoxic/ischemic injury. Resulting diffuse sulcal effacement with crowding of the posterior fossa and approximately 3 mm of downward cerebellar tonsillar herniation. 2. No substantial change in 11 mm thick right and trace left cerebral convexity hemorrhage with similar mass effect. These results will be called to the ordering clinician or representative by the Radiologist Assistant, and communication documented in the PACS or [employer]. Electronically Signed   By: Margaretha Sheffield MD   On: 06/27/2020 16:54   DG Chest Port 1 View  Result Date: 06/28/2020 CLINICAL DATA:  Acute respiratory failure. EXAM: PORTABLE CHEST 1 VIEW COMPARISON:  Chest x-ray June 26, 2020. FINDINGS: Endotracheal tube approximately 3.6 cm from the carina perhaps advanced slightly since previous imaging. Gastric tube courses through in off field of the radiograph. Side port below GE junction. Dual lumen catheter entering via RIGHT-sided approach tip in the RIGHT atrium. Cardiomediastinal contours and hilar structures are normal. Lungs are clear. No pneumothorax or effusion. EKG leads project over the chest. On limited assessment no acute skeletal process. IMPRESSION: 1. Possible interval slight advancement of endotracheal tube. 2. No sign of opacity near the RIGHT hilum on the current study. 3. No acute cardiopulmonary process. Electronically Signed   By: Zetta Bills M.D.   On: 06/28/2020 09:40   DG CHEST PORT 1 VIEW  Result Date: 06/26/2020 CLINICAL DATA:  Respiratory failure EXAM: PORTABLE CHEST 1 VIEW COMPARISON:  06/24/2020 FINDINGS: Endotracheal tube is been withdrawn, now 6.1 cm above the carina. Nasogastric tube extends into the upper abdomen beyond the margin of the examination. Right internal jugular hemodialysis catheter tip noted at the superior cavoatrial junction. The lungs are symmetrically well expanded. Focal opacity within the right infrahilar region may represent right middle lobe atelectasis or  infiltrate. The lungs are otherwise clear. No pneumothorax or pleural effusion. Cardiac size within normal limits. Pulmonary vascularity is normal. IMPRESSION: Support lines and tubes in appropriate position. Improved pulmonary insufflation. Focal opacity within the right infrahilar region, atelectasis versus infiltrate. Electronically Signed   By: Fidela Salisbury MD   On: 06/26/2020 02:10    Labs:  CBC: Recent Labs    06/26/20 0334 06/26/20 0336 06/26/20 6269 06/27/20 0445 06/28/20 0430 06/29/20 0657  WBC 32.5*  --   --  43.2* 40.4* 38.8*  HGB 9.2*   < > 9.5* 9.5* 8.2* 8.4*  HCT 27.6*   < > 28.0* 28.5* 25.1* 25.5*  PLT 297  --   --  339 322 380   < > = values in this interval not displayed.     COAGS: Recent Labs    12/05/19 0402  INR 1.1  APTT 31     BMP: Recent Labs    06/27/20 0445  06/27/20 1622 06/28/20 0430 06/29/20 0657  NA 137 138 137 134*  K 3.8 4.4 4.1 4.7  CL 103 101 103 100  CO2 _0 15*  GLUCOSE 229* 216* 93 131*  BUN 86* 53* 93* 152*  CALCIUM 8.4* 8.3* 8.5* 8.3*  CREATININE 4.36* 3.20* 4.52* 7.03*  GFRNONAA 14* 21* 14* 8*     LIVER FUNCTION TESTS: Recent Labs    06/24/20 0316 06/24/20 1520 06/25/20 0352 06/25/20 1712 06/26/20 0334 06/26/20 1718 06/27/20 0445 06/27/20 1622 06/28/20 0430 06/29/20 0657  BILITOT 0.8  --  0.5  --  0.8  --  0.3  --   --   --   AST 207*  --  89*  --  64*  --  50*  --   --   --   ALT 240*  --  179*  --  122*  --  77*  --   --   --   ALKPHOS 139*  --  163*  --  201*  --  157*  --   --   --   PROT 5.7*  --  6.2*  --  6.0*  --  5.7*  --   --   --   ALBUMIN 2.4*   < > 2.6*   < > 2.5*   < > 2.3* 2.3* 2.1* 2.1*   < > = values in this interval not displayed.     Assessment and Plan:  65 y.o. male inpatient. History of ESRD. COPD, CHF ,DM, HTN. Presented to the ED at [facility] on 6.18.22 with vomitting. Found to have symptomatic cholelithiasis with possible acute cholecystitis. Patient was afebrile at the time with  no leukocytosis. Patient and family declined surgery. Hospital stay complicated by PEA arrest . Now intubated due to respiratory failure with suspected anoxic brain injury.  S/p IR placed cholecystomy tube 6/21   Cholecystomy tube drain will need to remain in place at least 4-6 weeks unless gallbaldder surgically removed in interim; continue  to record output q shift; tid drain flushes inhouse and once daily with 5 ml sterile normal saline as outpatient; will schedule f/u cholangiogram in 4-6 weeks   Electronically Signed: Ascencion Dike, PA-C 06/29/2020, 10:54 AM   I spent a total of 15 Minutes at the the patient's bedside AND on the patient's hospital floor or unit, greater than 50% of which was counseling/coordinating care for cholecystomy tube placement

## 2020-06-29 NOTE — Progress Notes (Signed)
Called brother, introduced self and re-iterated poor prognosis. Informed him palliative will be calling him. Suspect we will need to pursue medical futility.  Erskine Emery MD PCCM

## 2020-06-29 NOTE — Care Plan (Signed)
MR Brain wo contrast 06/27/2020: Diffuse edema and restricted diffusion involving the infratentorial and supratentorial cortex and bilateral deep gray nuclei, compatible with severe hypoxic/ischemic injury. Resulting diffuse sulcal effacement with crowding of the posterior fossa and approximately 3 mm of downward cerebellar tonsillar herniation. No substantial change in 11 mm thick right and trace left cerebral convexity hemorrhage with similar mass effect.  Poor neurologic prognosis with minimal to no chances of meaningful neurologic recovery has already been discussed with family in the past. Neurology will be available if needed.  Mubarak Bevens Barbra Sarks
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
## 2020-06-29 NOTE — Progress Notes (Signed)
Palliative-   Thank you for this consult.  Chart reviewed. Patient evaluated.  Attempted to call listed contact [place].  Our call was prematurely disconnected- uncertain if by accident or intentional.  Tried to call back x 2 and was directed to a full voicemail box.  Will try again tomorrow.   Mariana Kaufman, AGNP-C Palliative Medicine  No charge

## 2020-06-29 NOTE — Progress Notes (Addendum)
NAME:  ARIK HUSMANN, MRN:  [id number], DOB:  Nov 08, 1955, LOS: 9 ADMISSION DATE:  06/13/2020, CONSULTATION DATE:  6/20 REFERRING MD:  Dr. Thereasa Solo, CHIEF COMPLAINT:  N/V; Post Cardiac Arrest   History of Present Illness:  Patient is a 65 yo M with pertinent PMH of COPD, ESRD on dialysis, DM, Heart murmur, HTN, and hyperlipidemia presenting to [facility] on 6/18 with N/V.  Patient arrived to [facility] on 6/18 stating his N/V began (6/17) during the night. He has not missed any dialysis treatments except for today. Pertinent ED labs show Creatinine of 9.6, Lipase 108. Nephrology consulted for ESRD. RUQ ultrasound show cholithiasis and patient has positive Murphy's sign. Surgery consulted. Patient declined surgery and perc tube placement at this time.   On 6/20, patient underwent CT abdomen with IV/oral contrast to evaluate gallbladder. While in CT patient lost pulse and cardiac arrested for 30 minutes before ROSC. Patient tongue appeared swollen and required intubation with a 6.5 ET tube due to the swelling. Epi, steroids, calcium, mag, and bicarb were given. One shock was given during one intermission with V-fib on rhythm check.   PCCM consulted on 6/20 for post cardiac arrest care and mechanical ventilator/airway management.  Pertinent  Medical History   Past Medical History:  Diagnosis Date   COPD (chronic obstructive pulmonary disease) ([place])    Diabetes mellitus without complication ([place])    Enlarged prostate    ESRD on dialysis ([place])    Heart murmur    High cholesterol    Hypertension     Significant Hospital Events: Including procedures, antibiotic start and stop dates in addition to other pertinent events   6/18: admitted to [facility] with symptomatic cholelithiasis 6/20: CT abdomen with oral contrast ordered; patient cardiac arrested x30 minutes until ROSC and appeared anaphylactic from contrast with swollen tongue. Intubated with 6.5 ETT. Admitted to ICU. Patient went bradycardic and cardiac  arrested again and given 1 atropine, 1 epi, and bicarb given; ROSC after 2 mintues. Myoclonic activity: EEG pending. 6/21: Initiated on CRRT.  Percutaneous cholecystostomy 6/22 CT H  with signs of anoxic/hypoxic injury  6/23 discussed neuro prognostication with family. CCS plan to s/o  6/24 Best exam is extensor response to suctioning, insulin drip for hyperglycemia 6/25 insulin drip discontinued, MRI  Interim History / Subjective:  Remains comatose on vent.  Objective   Blood pressure (!) 134/55, pulse 79, temperature 99.32 F (37.4 C), resp. rate 14, height 5\' 2"  (1.575 m), weight 80.4 kg, SpO2 100 %.    Vent Mode: PRVC FiO2 (%):  [30 %-40 %] 30 % Set Rate:  [12 bmp] 12 bmp Vt Set:  [550 mL] 550 mL PEEP:  [5 cmH20] 5 cmH20 Plateau Pressure:  [19 cmH20-20 cmH20] 20 cmH20   Intake/Output Summary (Last 24 hours) at 06/29/2020 0715 Last data filed at 06/29/2020 0600 Gross per 24 hour  Intake 849.11 ml  Output 150 ml  Net 699.11 ml    Filed Weights   06/27/20 1310 06/28/20 0500 06/29/20 0500  Weight: 80.5 kg 79.4 kg 80.4 kg    Examination: Constitutional: unresponsive man on vent  Eyes: mild nystagmus, see neuro Ears, nose, mouth, and throat: ETT in place, minimal secretions Cardiovascular: RRR, ext warm Respiratory: mechanical breath sounds, triggering vent Gastrointestinal: soft, RUQ drain in place, hypoactive bS Skin: No rashes, normal turgor Neurologic:  GCS3 No corneals + doll's Pupils sluggish, equal No cough or gag Triggers vent   Psychiatric: cannot assess  Yesterday lab WBC stably elevated H/H  stable CBG okay No new imaging  Resolved Hospital Problem list     Assessment & Plan:   Severe anoxic brain injury S/p Cardiac arrest with ROSC  Acute respiratory failure with hypoxia  Hx COPD  Sepsis due to acute cholecystitis s/p perc cholecystostomy with IR 6/21 ESRD w/ vasoplegia during HD Anemia of chronic renal disease Dm2, uncontrolled  hyperglycemia Hx HTN Persistent leukocytosis Question ileus  - Terminal prognosis, family having trouble coming to terms - Bringing palliative on board and likely will need ethics - Continue vent support, HD, abx in interim - Check KUB - f/u am labs  Best Practice (right click and "Reselect all SmartList Selections" daily)   Diet/type: NPO Pain/Anxiety/Delirium protocol RASS goal: 0  no sedation VAP protocol (if indicated): Yes DVT prophylaxis: LMWH GI prophylaxis: H2B Glucose control:  SSI Central venous access:  removal ordered  Arterial line:  removal ordered  Foley:  N/A Mobility:  bed rest  PT consulted: N/A  Code Status:  full code Last date of multidisciplinary goals of care discussion will work on arranging with palliative Disposition: remains critically ill, will stay in intensive care   Patient critically ill due to anoxic brain injury, respiratory failure Interventions to address this today family discussions, vent titration Risk of deterioration without these interventions is high  I personally spent 34 minutes providing critical care not including any separately billable procedures  Erskine Emery MD [facility]  Prefer epic messenger for cross cover needs If after hours, please call E-link

## 2020-06-29 NOTE — Plan of Care (Signed)

## 2020-06-29 NOTE — Progress Notes (Addendum)
[facility] Progress Note   Subjective:   no change overnight   Objective Vitals:   06/29/20 1000 06/29/20 1057 06/29/20 1100 06/29/20 1200  BP: (!) 127/55 (!) 121/52 (!) 108/54 (!) 123/53  Pulse: 80 81 81 82  Resp: 15 (!) 21 (!) 23 18  Temp: 99.1 F (37.3 C)  99.5 F (37.5 C) 99.5 F (37.5 C)  TempSrc:    Oral  SpO2: 100% 100% 100% 100%  Weight:      Height:       Physical Exam General: intubated, sedated Heart: Regular Lungs: Coarse bs b/l Abdomen: Round, distended but soft, active bowel sounds Extremities: pitting edema bilateral lower extremities Dialysis Access: [facility] R chest  NEURO: encephalopathic  Filed Weights   06/27/20 1310 06/28/20 0500 06/29/20 0500  Weight: 80.5 kg 79.4 kg 80.4 kg    Intake/Output Summary (Last 24 hours) at 06/29/2020 1220 Last data filed at 06/29/2020 0600 Gross per 24 hour  Intake 849.11 ml  Output 150 ml  Net 699.11 ml     Additional Objective Labs: Basic Metabolic Panel: Recent Labs  Lab 06/27/20 1622 06/28/20 0430 06/29/20 0657  NA 138 137 134*  K 4.4 4.1 4.7  CL 101 103 100  CO2 25 22 15*  GLUCOSE 216* 93 131*  BUN 53* 93* 152*  CREATININE 3.20* 4.52* 7.03*  CALCIUM 8.3* 8.5* 8.3*  PHOS 3.6 5.1* 7.7*    Liver Function Tests: Recent Labs  Lab 06/25/20 0352 06/25/20 1712 06/26/20 0334 06/26/20 1718 06/27/20 0445 06/27/20 1622 06/28/20 0430 06/29/20 0657  AST 89*  --  64*  --  50*  --   --   --   ALT 179*  --  122*  --  77*  --   --   --   ALKPHOS 163*  --  201*  --  157*  --   --   --   BILITOT 0.5  --  0.8  --  0.3  --   --   --   PROT 6.2*  --  6.0*  --  5.7*  --   --   --   ALBUMIN 2.6*   < > 2.5*   < > 2.3* 2.3* 2.1* 2.1*   < > = values in this interval not displayed.    No results for input(s): LIPASE, AMYLASE in the last 168 hours.  CBC: Recent Labs  Lab 06/22/20 2151 06/22/20 2233 06/25/20 0352 06/26/20 0334 06/26/20 0336 06/27/20 0445 06/28/20 0430 06/29/20 0657  WBC  29.3*   < > 25.8* 32.5*  --  43.2* 40.4* 38.8*  NEUTROABS 22.4*  --   --   --   --   --   --   --   HGB 8.5*   < > 8.7* 9.2*   < > 9.5* 8.2* 8.4*  HCT 26.5*   < > 26.6* 27.6*   < > 28.5* 25.1* 25.5*  MCV 97.8   < > 94.7 92.9  --  96.0 96.5 95.1  PLT 194   < > 311 297  --  339 322 380   < > = values in this interval not displayed.    Blood Culture    Component Value Date/Time   SDES BLOOD LEFT ANTECUBITAL 06/27/2020 1203   SPECREQUEST  06/27/2020 1203    BOTTLES DRAWN AEROBIC AND ANAEROBIC Blood Culture results may not be optimal due to an inadequate volume of blood received in culture bottles   CULT  06/27/2020  1203    NO GROWTH 2 DAYS Performed at [facility], [address], [place], [place] [address]    REPTSTATUS PENDING 06/27/2020 1203    Cardiac Enzymes: No results for input(s): CKTOTAL, CKMB, CKMBINDEX, TROPONINI in the last 168 hours. CBG: Recent Labs  Lab 06/28/20 1951 06/28/20 2352 06/29/20 0436 06/29/20 0728 06/29/20 1111  GLUCAP 141* 174* 128* 128* 140*    Iron Studies: No results for input(s): IRON, TIBC, TRANSFERRIN, FERRITIN in the last 72 hours. Lab Results  Component Value Date   INR 1.1 12/05/2019   INR 1.1 05/12/2019   Studies/Results: DG Abd 1 View  Result Date: 06/29/2020 CLINICAL DATA:  Abdominal pain EXAM: ABDOMEN - 1 VIEW COMPARISON:  June 22, 2020 FINDINGS: Nasogastric tube tip and side port in stomach. There is fairly mild stool volume in the colon. There is no bowel dilatation or air-fluid level to suggest bowel obstruction. No free air. Note that there is a pigtail drainage catheter in the right lateral abdomen. There are apparent phleboliths in the pelvis. IMPRESSION: Nasogastric tube tip and side port in stomach. Apparent drainage catheter in lateral right abdomen. No bowel obstruction or free air evident. Electronically Signed   By: Lowella Grip III M.D.   On: 06/29/2020 08:39   MR BRAIN WO CONTRAST  Result Date:  06/27/2020 CLINICAL DATA:  Anoxic brain damage EXAM: MRI HEAD WITHOUT CONTRAST TECHNIQUE: Multiplanar, multiecho pulse sequences of the brain and surrounding structures were obtained without intravenous contrast. COMPARISON:  CT head June 24, 2020. FINDINGS: Brain: Edema and restricted diffusion involving the infratentorial and supratentorial cortex and bilateral deep gray nuclei, compatible with severe hypoxic/ischemic injury. Ischemia/Restricted diffusion is most conspicuous in the bilateral parietal and occipital cortex and cerebellum. Resulting diffuse sulcal effacement with crowding at the basal cisterns, posterior fossa and foramen magnum with approximately 3 mm of downward cerebellar tonsillar herniation. No substantial change in 11 mm thick right and trace left cerebral convexity hemorrhage. Similar mass effect. No hydrocephalus. No midline shift. No mass lesion. Vascular: Major arterial flow voids are maintained at the skull base. Skull and upper cervical spine: Normal marrow signal. Sinuses/Orbits: Moderate paranasal sinus mucosal thickening. Unremarkable orbits. Other: Large bilateral mastoid effusions. IMPRESSION: 1. Diffuse edema and restricted diffusion involving the infratentorial and supratentorial cortex and bilateral deep gray nuclei, compatible with severe hypoxic/ischemic injury. Resulting diffuse sulcal effacement with crowding of the posterior fossa and approximately 3 mm of downward cerebellar tonsillar herniation. 2. No substantial change in 11 mm thick right and trace left cerebral convexity hemorrhage with similar mass effect. These results will be called to the ordering clinician or representative by the Radiologist Assistant, and communication documented in the PACS or [employer]. Electronically Signed   By: Margaretha Sheffield MD   On: 06/27/2020 16:54   DG Chest Port 1 View  Result Date: 06/28/2020 CLINICAL DATA:  Acute respiratory failure. EXAM: PORTABLE CHEST 1 VIEW  COMPARISON:  Chest x-ray June 26, 2020. FINDINGS: Endotracheal tube approximately 3.6 cm from the carina perhaps advanced slightly since previous imaging. Gastric tube courses through in off field of the radiograph. Side port below GE junction. Dual lumen catheter entering via RIGHT-sided approach tip in the RIGHT atrium. Cardiomediastinal contours and hilar structures are normal. Lungs are clear. No pneumothorax or effusion. EKG leads project over the chest. On limited assessment no acute skeletal process. IMPRESSION: 1. Possible interval slight advancement of endotracheal tube. 2. No sign of opacity near the RIGHT hilum on the current  study. 3. No acute cardiopulmonary process. Electronically Signed   By: Zetta Bills M.D.   On: 06/28/2020 09:40    Medications:  ceFEPime (MAXIPIME) IV Stopped (06/28/20 1717)   dextrose     levETIRAcetam 1,000 mg (06/29/20 9090)   metronidazole 500 mg (06/29/20 0943)   vancomycin Stopped (06/29/20 1142)    amLODipine  10 mg Per Tube Daily   artificial tears  1 application Both Eyes B0B   B-complex with vitamin C  1 tablet Per Tube Daily   calcitRIOL  0.25 mcg Per Tube Once per day on Tue Thu Sat   carvedilol  12.5 mg Per Tube BID   chlorhexidine gluconate (MEDLINE KIT)  15 mL Mouth Rinse BID   Chlorhexidine Gluconate Cloth  6 each Topical Daily   darbepoetin (ARANESP) injection - DIALYSIS  60 mcg Subcutaneous Weekly   docusate  100 mg Per Tube BID   feeding supplement (PROSource TF)  90 mL Per Tube TID   feeding supplement (VITAL 1.5 CAL)  1,000 mL Per Tube Q24H   heparin injection (subcutaneous)  5,000 Units Subcutaneous Q8H   hydrALAZINE  50 mg Per Tube Q8H   insulin aspart  3-9 Units Subcutaneous Q4H   insulin aspart  7 Units Subcutaneous Q4H   insulin detemir  20 Units Subcutaneous Q12H   mouth rinse  15 mL Mouth Rinse 10 times per day   pantoprazole (PROTONIX) IV  40 mg Intravenous QHS   scopolamine  1 patch Transdermal Q72H   sodium chloride  flush  10-40 mL Intracatheter Q12H   sodium chloride flush  5 mL Intracatheter Q8H   valproic acid  250 mg Per Tube TID    Dialysis Orders: TTS [place] [facility])  4h 69mn   70.5kg  400/500   2/2.25 bath  TDC  Hep 1000  - calc 0.222m PO q HD  - mircera 50 q4 last 5/31  - venofer 50 /wk  - sensipar 12034mO q HD  Assessment/Plan: S/p cardiac arrest, prolonged time until ROSC 06/22/20.   Anoxic brain injury-  MRI confirms and is bad Acute cholecysttis- CCS following s/p perc drain 6/21; cefepime flagyl ESRD: R IJ [facility]DC. [place] [facility] [place] [facility] , IHD 6/25- BUN rising as is catabolic. Creat okay at 7, plan HD tomorrow on schedule.   Hypotension - better, now off pressors  Anemia: Hgb stable,  no meds , Hb 8s CKD-BMD Ca ok,  Mild/mod hyponatremia: resolved  RobSol BlazingD  [place]27/2022,12:20 PM  LOS: 9 days

## 2020-06-29 NOTE — Plan of Care (Signed)
  Problem: Education: Goal: Knowledge of General Education information will improve Description: Including pain rating scale, medication(s)/side effects and non-pharmacologic comfort measures Outcome: Not Progressing   Problem: Health Behavior/Discharge Planning: Goal: Ability to manage health-related needs will improve Outcome: Not Progressing   Problem: Clinical Measurements: Goal: Ability to maintain clinical measurements within normal limits will improve Outcome: Not Progressing Goal: Will remain free from infection Outcome: Not Progressing Goal: Diagnostic test results will improve Outcome: Not Progressing Goal: Respiratory complications will improve Outcome: Not Progressing Goal: Cardiovascular complication will be avoided Outcome: Not Progressing   Problem: Activity: Goal: Risk for activity intolerance will decrease Outcome: Not Progressing   Problem: Nutrition: Goal: Adequate nutrition will be maintained Outcome: Not Progressing   Problem: Coping: Goal: Level of anxiety will decrease Outcome: Not Progressing   Problem: Elimination: Goal: Will not experience complications related to bowel motility Outcome: Not Progressing   Problem: Pain Managment: Goal: General experience of comfort will improve Outcome: Not Progressing   Problem: Safety: Goal: Ability to remain free from injury will improve Outcome: Not Progressing   Problem: Skin Integrity: Goal: Risk for impaired skin integrity will decrease Outcome: Not Progressing   Problem: Education: Goal: Knowledge of disease and its progression will improve Outcome: Not Progressing Goal: Individualized Educational Video(s) Outcome: Not Progressing   Problem: Fluid Volume: Goal: Compliance with measures to maintain balanced fluid volume will improve Outcome: Not Progressing   Problem: Health Behavior/Discharge Planning: Goal: Ability to manage health-related needs will improve Outcome: Not Progressing    Problem: Nutritional: Goal: Ability to make healthy dietary choices will improve Outcome: Not Progressing   Problem: Clinical Measurements: Goal: Complications related to the disease process, condition or treatment will be avoided or minimized Outcome: Not Progressing

## 2020-06-30 DIAGNOSIS — Z515 Encounter for palliative care: Secondary | ICD-10-CM

## 2020-06-30 LAB — COMPREHENSIVE METABOLIC PANEL
ALT: 313 U/L — ABNORMAL HIGH (ref 0–44)
AST: 400 U/L — ABNORMAL HIGH (ref 15–41)
Albumin: 1.7 g/dL — ABNORMAL LOW (ref 3.5–5.0)
Alkaline Phosphatase: 78 U/L (ref 38–126)
Anion gap: 29 — ABNORMAL HIGH (ref 5–15)
BUN: 128 mg/dL — ABNORMAL HIGH (ref 8–23)
CO2: 16 mmol/L — ABNORMAL LOW (ref 22–32)
Calcium: 9.9 mg/dL (ref 8.9–10.3)
Chloride: 94 mmol/L — ABNORMAL LOW (ref 98–111)
Creatinine, Ser: 6.33 mg/dL — ABNORMAL HIGH (ref 0.61–1.24)
GFR, Estimated: 9 mL/min — ABNORMAL LOW (ref >60)
Glucose, Bld: 286 mg/dL — ABNORMAL HIGH (ref 70–99)
Potassium: 4 mmol/L (ref 3.5–5.1)
Sodium: 139 mmol/L (ref 135–145)
Total Bilirubin: 0.7 mg/dL (ref 0.3–1.2)
Total Protein: 4.4 g/dL — ABNORMAL LOW (ref 6.5–8.1)

## 2020-06-30 LAB — RENAL FUNCTION PANEL
Albumin: 1.9 g/dL — ABNORMAL LOW (ref 3.5–5.0)
Albumin: 1.8 g/dL — ABNORMAL LOW (ref 3.5–5.0)
Anion gap: 25 — ABNORMAL HIGH (ref 5–15)
Anion gap: 18 — ABNORMAL HIGH (ref 5–15)
BUN: 189 mg/dL — ABNORMAL HIGH (ref 8–23)
BUN: 146 mg/dL — ABNORMAL HIGH (ref 8–23)
CO2: 12 mmol/L — ABNORMAL LOW (ref 22–32)
CO2: 16 mmol/L — ABNORMAL LOW (ref 22–32)
Calcium: 8 mg/dL — ABNORMAL LOW (ref 8.9–10.3)
Calcium: 7.5 mg/dL — ABNORMAL LOW (ref 8.9–10.3)
Chloride: 98 mmol/L (ref 98–111)
Chloride: 101 mmol/L (ref 98–111)
Creatinine, Ser: 8.55 mg/dL — ABNORMAL HIGH (ref 0.61–1.24)
Creatinine, Ser: 6.8 mg/dL — ABNORMAL HIGH (ref 0.61–1.24)
GFR, Estimated: 6 mL/min — ABNORMAL LOW (ref >60)
GFR, Estimated: 8 mL/min — ABNORMAL LOW (ref >60)
Glucose, Bld: 145 mg/dL — ABNORMAL HIGH (ref 70–99)
Glucose, Bld: 217 mg/dL — ABNORMAL HIGH (ref 70–99)
Phosphorus: 8.6 mg/dL — ABNORMAL HIGH (ref 2.5–4.6)
Phosphorus: 8.2 mg/dL — ABNORMAL HIGH (ref 2.5–4.6)
Potassium: 4.9 mmol/L (ref 3.5–5.1)
Potassium: 4.7 mmol/L (ref 3.5–5.1)
Sodium: 135 mmol/L (ref 135–145)
Sodium: 135 mmol/L (ref 135–145)

## 2020-06-30 LAB — POCT I-STAT 7, (LYTES, BLD GAS, ICA,H+H)
Acid-base deficit: 4 mmol/L — ABNORMAL HIGH (ref 0.0–2.0)
Bicarbonate: 19.2 mmol/L — ABNORMAL LOW (ref 20.0–28.0)
Calcium, Ion: 1.04 mmol/L — ABNORMAL LOW (ref 1.15–1.40)
HCT: 21 % — ABNORMAL LOW (ref 39.0–52.0)
Hemoglobin: 7.1 g/dL — ABNORMAL LOW (ref 13.0–17.0)
O2 Saturation: 100 %
Patient temperature: 35.9
Potassium: 5 mmol/L (ref 3.5–5.1)
Sodium: 130 mmol/L — ABNORMAL LOW (ref 135–145)
TCO2: 20 mmol/L — ABNORMAL LOW (ref 22–32)
pCO2 arterial: 26.6 mmHg — ABNORMAL LOW (ref 32.0–48.0)
pH, Arterial: 7.462 — ABNORMAL HIGH (ref 7.350–7.450)
pO2, Arterial: 404 mmHg — ABNORMAL HIGH (ref 83.0–108.0)

## 2020-06-30 LAB — GLUCOSE, CAPILLARY
Glucose-Capillary: 137 mg/dL — ABNORMAL HIGH (ref 70–99)
Glucose-Capillary: 143 mg/dL — ABNORMAL HIGH (ref 70–99)
Glucose-Capillary: 153 mg/dL — ABNORMAL HIGH (ref 70–99)
Glucose-Capillary: 179 mg/dL — ABNORMAL HIGH (ref 70–99)
Glucose-Capillary: 211 mg/dL — ABNORMAL HIGH (ref 70–99)
Glucose-Capillary: 259 mg/dL — ABNORMAL HIGH (ref 70–99)
Glucose-Capillary: 75 mg/dL (ref 70–99)
Glucose-Capillary: 95 mg/dL (ref 70–99)

## 2020-06-30 LAB — CULTURE, RESPIRATORY W GRAM STAIN

## 2020-06-30 LAB — CBC
HCT: 19.8 % — ABNORMAL LOW (ref 39.0–52.0)
Hemoglobin: 6.3 g/dL — CL (ref 13.0–17.0)
MCH: 32.1 pg (ref 26.0–34.0)
MCHC: 31.8 g/dL (ref 30.0–36.0)
MCV: 101 fL — ABNORMAL HIGH (ref 80.0–100.0)
Platelets: 280 10*3/uL (ref 150–400)
RBC: 1.96 MIL/uL — ABNORMAL LOW (ref 4.22–5.81)
RDW: 16.5 % — ABNORMAL HIGH (ref 11.5–15.5)
WBC: 34.6 10*3/uL — ABNORMAL HIGH (ref 4.0–10.5)
nRBC: 0.8 % — ABNORMAL HIGH (ref 0.0–0.2)

## 2020-06-30 LAB — HEMOGLOBIN AND HEMATOCRIT, BLOOD
HCT: 19.4 % — ABNORMAL LOW (ref 39.0–52.0)
Hemoglobin: 6.4 g/dL — CL (ref 13.0–17.0)

## 2020-06-30 LAB — PHOSPHORUS: Phosphorus: 10.8 mg/dL — ABNORMAL HIGH (ref 2.5–4.6)

## 2020-06-30 LAB — MAGNESIUM
Magnesium: 3.3 mg/dL — ABNORMAL HIGH (ref 1.7–2.4)
Magnesium: 4.9 mg/dL — ABNORMAL HIGH (ref 1.7–2.4)

## 2020-06-30 LAB — PREPARE RBC (CROSSMATCH)

## 2020-06-30 LAB — HEPATITIS B SURFACE ANTIGEN: Hepatitis B Surface Ag: NONREACTIVE

## 2020-06-30 LAB — LACTIC ACID, PLASMA: Lactic Acid, Venous: 10.5 mmol/L (ref 0.5–1.9)

## 2020-06-30 LAB — BRAIN NATRIURETIC PEPTIDE: B Natriuretic Peptide: 692.7 pg/mL — ABNORMAL HIGH (ref 0.0–100.0)

## 2020-06-30 LAB — ABO/RH: ABO/RH(D): AB POS

## 2020-06-30 LAB — TROPONIN I (HIGH SENSITIVITY): Troponin I (High Sensitivity): 295 ng/L (ref <18)

## 2020-06-30 LAB — TRIGLYCERIDES: Triglycerides: 202 mg/dL — ABNORMAL HIGH (ref <150)

## 2020-06-30 LAB — HEPATITIS B CORE ANTIBODY, TOTAL: Hep B Core Total Ab: NONREACTIVE

## 2020-06-30 LAB — AMMONIA: Ammonia: 109 umol/L — ABNORMAL HIGH (ref 9–35)

## 2020-06-30 MED ORDER — AMIODARONE HCL IN DEXTROSE 360-4.14 MG/200ML-% IV SOLN
INTRAVENOUS | Status: AC
  Administered 2020-06-30: 60 mg/h via INTRAVENOUS
  Filled 2020-06-30: qty 200

## 2020-06-30 MED ORDER — AMIODARONE HCL IN DEXTROSE 360-4.14 MG/200ML-% IV SOLN
30.0000 mg/h | INTRAVENOUS | Status: DC
  Administered 2020-06-30 – 2020-07-02 (×4): 30 mg/h via INTRAVENOUS
  Filled 2020-06-30 (×5): qty 200

## 2020-06-30 MED ORDER — NOREPINEPHRINE 16 MG/250ML-% IV SOLN
0.0000 ug/min | INTRAVENOUS | Status: DC
  Administered 2020-06-30: 25 ug/min via INTRAVENOUS
  Filled 2020-06-30: qty 500

## 2020-06-30 MED ORDER — SODIUM CHLORIDE 0.9% IV SOLUTION: Freq: Once | INTRAVENOUS | Status: AC

## 2020-06-30 MED ORDER — NOREPINEPHRINE 4 MG/250ML-% IV SOLN
INTRAVENOUS | Status: AC
  Administered 2020-06-30: 4 mg
  Filled 2020-06-30: qty 250

## 2020-06-30 MED ORDER — PANTOPRAZOLE SODIUM 40 MG IV SOLR
40.0000 mg | Freq: Two times a day (BID) | INTRAVENOUS | Status: DC
  Administered 2020-06-30 – 2020-07-02 (×4): 40 mg via INTRAVENOUS
  Filled 2020-06-30 (×4): qty 40

## 2020-06-30 MED ORDER — SODIUM BICARBONATE 8.4 % IV SOLN
INTRAVENOUS | Status: AC
  Administered 2020-06-30: 50 meq
  Filled 2020-06-30: qty 50

## 2020-06-30 MED ORDER — AMIODARONE HCL IN DEXTROSE 360-4.14 MG/200ML-% IV SOLN
60.0000 mg/h | INTRAVENOUS | Status: AC
  Administered 2020-06-30: 60 mg/h via INTRAVENOUS

## 2020-06-30 MED ORDER — EPINEPHRINE 1 MG/10ML IJ SOSY
PREFILLED_SYRINGE | INTRAMUSCULAR | Status: AC
  Administered 2020-06-30: 1 mg
  Filled 2020-06-30: qty 20

## 2020-06-30 MED ORDER — SODIUM CHLORIDE 0.9 % IV SOLN
1.0000 g | INTRAVENOUS | Status: DC
  Administered 2020-06-30: 1 g via INTRAVENOUS
  Filled 2020-06-30 (×2): qty 1

## 2020-06-30 MED ORDER — VALPROIC ACID 250 MG/5ML PO SOLN
500.0000 mg | Freq: Three times a day (TID) | ORAL | Status: DC
  Administered 2020-06-30 – 2020-07-02 (×8): 500 mg
  Filled 2020-06-30 (×8): qty 10

## 2020-06-30 MED ORDER — ATROPINE SULFATE 1 MG/10ML IJ SOSY
PREFILLED_SYRINGE | INTRAMUSCULAR | Status: AC
  Administered 2020-06-30: 1 mg
  Filled 2020-06-30: qty 10

## 2020-06-30 MED ORDER — EPINEPHRINE HCL 5 MG/250ML IV SOLN IN NS
0.5000 ug/min | INTRAVENOUS | Status: DC
  Administered 2020-06-30: 75 ug/min via INTRAVENOUS
  Administered 2020-06-30: 5 ug/min via INTRAVENOUS
  Filled 2020-06-30 (×4): qty 250

## 2020-06-30 MED ORDER — EPINEPHRINE 1 MG/10ML IJ SOSY
PREFILLED_SYRINGE | INTRAMUSCULAR | Status: AC
  Administered 2020-06-30: 1 mg
  Filled 2020-06-30: qty 30

## 2020-06-30 MED ORDER — LORAZEPAM 2 MG/ML IJ SOLN
4.0000 mg | Freq: Once | INTRAMUSCULAR | Status: AC
  Administered 2020-06-30: 4 mg via INTRAVENOUS
  Filled 2020-06-30: qty 2

## 2020-06-30 MED ORDER — AMIODARONE HCL IN DEXTROSE 360-4.14 MG/200ML-% IV SOLN
INTRAVENOUS | Status: AC
  Filled 2020-06-30: qty 200

## 2020-06-30 NOTE — Progress Notes (Signed)
   06/30/20 0900  Clinical Encounter Type  Visited With Patient and family together  Visit Type Code;Spiritual support;Social support  Referral From Nurse  Consult/Referral To [place]  The chaplain responded to code blue on pager. The chaplain was present at bedside with the patient, his sister-in-law, and nephew. The patient's brother is on the way. The patient's brother is about an hour and a half away. The family at bedside requested prayer. The family is [facility]. The chaplain offered a prayer of strength, peace and clarity. The chaplain spoke with the family along with the doctor and [facility]. The chaplain will follow up as needed.

## 2020-06-30 NOTE — TOC Progression Note (Signed)
Transition of Care [facility]) - Progression Note    Patient Details  Name: Daniel Thomas MRN: [id number] Date of Birth: 10-16-1955  Transition of Care [facility]) CM/SW [place], [place] Phone Number: 06/30/2020, 1:46 PM  Clinical Narrative:     CSW is called by palliative and informed that pt brother would like to speak with CSW. A consult was placed yesterday afternoon. CSW met with pt brother, sister in-law, and nephew in conference room. Brother explained he had concerns about pt's care. Brother specifically spoke about pt's care on Sunday 6/26 where he had concerns on how pt could have completed his IV abx course as he reported that he witnessed pt's IV lines malfunctioning throughout the day and staff having difficulty placing IV lines. He also had concerns regarding Monday 6/27 about the timeline regarding pt receiving Iodine, going to his CT scan, and when staff realized pt was unresponsive. He also had questions about the type of Iodine pt received. Brother is specifically requesting records regarding these dates and direction on reporting grievances regarding pt's care.   CSW provides information for patient experience, records, and included information for  [employer]. CSW informed pt brother that he would covering 2H through July 1 and provided work number.

## 2020-06-30 NOTE — Procedures (Signed)
Arterial Catheter Insertion Procedure Note  Daniel Thomas  [id number]  02-13-1955  Date:06/30/20  Time:9:39 AM    Provider Performing: Candee Furbish    Procedure: Insertion of Arterial Line [phone]) without US guidance  Indication(s) Blood pressure monitoring and/or need for frequent ABGs  Consent Unable to obtain consent due to emergent nature of procedure.  Anesthesia None   Time Out Verified patient identification, verified procedure, site/side was marked, verified correct patient position, special equipment/implants available, medications/allergies/relevant history reviewed, required imaging and test results available.   Sterile Technique Maximal sterile technique was not able to be achieved due to emergent nature of procedure.   Procedure Description Area of catheter insertion was cleaned with chlorhexidine and draped in sterile fashion. Without real-time ultrasound guidance an arterial catheter was placed into the right femoral artery.  Appropriate arterial tracings confirmed on monitor.     Complications/Tolerance None; patient tolerated the procedure well.   EBL Minimal   Specimen(s) None
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
## 2020-06-30 NOTE — Progress Notes (Addendum)
H/H slightly low, will give another unit. Now triggering vent so cannot perform brain death testing.  Family does not want anything other than aggressive care at this time. Palliative will see patient, will also see if Dr. Hulen Skains can address some of their concerns.  Additional 35 minutes cc time  Erskine Emery MD PCCM

## 2020-06-30 NOTE — Progress Notes (Signed)
MD notified for critical Lactic acid, Hemoglobin, and Troponin values.

## 2020-06-30 NOTE — Progress Notes (Signed)
[facility] Progress Note   Subjective:   attempted hemodialysis but shortly into the procedure pt coded. Is now on multiple pressors. Per CCM the situation is terminal and they are awaiting family to arrive to allow pt to pass in peace.    Objective Vitals:   06/30/20 0930 06/30/20 0945 06/30/20 1000 06/30/20 1015  BP: 100/65 (!) 206/126 (!) 180/94 (!) 178/86  Pulse:   (!) 103 (!) 103  Resp: (!) 24 (!) 21 (!) 21 (!) 21  Temp: (!) 92.3 F (33.5 C) (!) 95.5 F (35.3 C) (!) 95.7 F (35.4 C) (!) 96.3 F (35.7 C)  TempSrc:      SpO2:   100% 100%  Weight:      Height:       Physical Exam General: intubated, sedated Heart: Regular Lungs: Coarse bs b/l Abdomen: Round, distended but soft, active bowel sounds Extremities: pitting edema bilateral lower extremities Dialysis Access: [facility] R chest  NEURO: encephalopathic  Filed Weights   06/28/20 0500 06/29/20 0500 06/30/20 0500  Weight: 79.4 kg 80.4 kg 81.8 kg    Intake/Output Summary (Last 24 hours) at 06/30/2020 1207 Last data filed at 06/30/2020 0800 Gross per 24 hour  Intake 1231.78 ml  Output 110 ml  Net 1121.78 ml     Additional Objective Labs: Basic Metabolic Panel: Recent Labs  Lab 06/29/20 1601 06/30/20 0615 06/30/20 0930 06/30/20 1150  NA 136 135 139 130*  K 4.8 4.9 4.0 5.0  CL 102 98 94*  --   CO2 14* 12* 16*  --   GLUCOSE 156* 145* 286*  --   BUN 166* 189* 128*  --   CREATININE 7.46* 8.55* 6.33*  --   CALCIUM 8.0* 8.0* 9.9  --   PHOS 7.8* 8.6* 10.8*  --     Liver Function Tests: Recent Labs  Lab 06/26/20 0334 06/26/20 1718 06/27/20 0445 06/27/20 1622 06/29/20 1601 06/30/20 0615 06/30/20 0930  AST 64*  --  50*  --   --   --  400*  ALT 122*  --  77*  --   --   --  313*  ALKPHOS 201*  --  157*  --   --   --  78  BILITOT 0.8  --  0.3  --   --   --  0.7  PROT 6.0*  --  5.7*  --   --   --  4.4*  ALBUMIN 2.5*   < > 2.3*   < > 1.9* 1.9* 1.7*   < > = values in this interval not  displayed.    No results for input(s): LIPASE, AMYLASE in the last 168 hours.  CBC: Recent Labs  Lab 06/26/20 0334 06/26/20 0336 06/27/20 0445 06/28/20 0430 06/29/20 0657 06/30/20 0930 06/30/20 1150  WBC 32.5*  --  43.2* 40.4* 38.8* 34.6*  --   HGB 9.2*   < > 9.5* 8.2* 8.4* 6.3* 7.1*  HCT 27.6*   < > 28.5* 25.1* 25.5* 19.8* 21.0*  MCV 92.9  --  96.0 96.5 95.1 101.0*  --   PLT 297  --  339 322 380 280  --    < > = values in this interval not displayed.    Blood Culture    Component Value Date/Time   SDES BLOOD LEFT ANTECUBITAL 06/27/2020 1203   SPECREQUEST  06/27/2020 1203    BOTTLES DRAWN AEROBIC AND ANAEROBIC Blood Culture results may not be optimal due to an inadequate volume of blood  received in culture bottles   CULT  06/27/2020 1203    NO GROWTH 3 DAYS Performed at [facility], [place] [address], [place], [place] [address]    REPTSTATUS PENDING 06/27/2020 1203    Cardiac Enzymes: No results for input(s): CKTOTAL, CKMB, CKMBINDEX, TROPONINI in the last 168 hours. CBG: Recent Labs  Lab 06/29/20 1613 06/29/20 2010 06/30/20 0031 06/30/20 0439 06/30/20 0732  GLUCAP 162* 179* 153* 137* 143*    Iron Studies: No results for input(s): IRON, TIBC, TRANSFERRIN, FERRITIN in the last 72 hours. Lab Results  Component Value Date   INR 1.1 12/05/2019   INR 1.1 05/12/2019   Studies/Results: DG Abd 1 View  Result Date: 06/29/2020 CLINICAL DATA:  Abdominal pain EXAM: ABDOMEN - 1 VIEW COMPARISON:  June 22, 2020 FINDINGS: Nasogastric tube tip and side port in stomach. There is fairly mild stool volume in the colon. There is no bowel dilatation or air-fluid level to suggest bowel obstruction. No free air. Note that there is a pigtail drainage catheter in the right lateral abdomen. There are apparent phleboliths in the pelvis. IMPRESSION: Nasogastric tube tip and side port in stomach. Apparent drainage catheter in lateral right abdomen. No bowel obstruction or free air  evident. Electronically Signed   By: Lowella Grip III M.D.   On: 06/29/2020 08:39    Medications:  ceFEPime (MAXIPIME) IV     dextrose     epinephrine 75 mcg/min (06/30/20 1126)   levETIRAcetam Stopped (06/30/20 0246)   metronidazole 500 mg (06/30/20 1154)   norepinephrine (LEVOPHED) Adult infusion 25 mcg/min (06/30/20 1142)   propofol (DIPRIVAN) infusion 15 mcg/kg/min (06/30/20 0600)   vancomycin      amLODipine  10 mg Per Tube Daily   artificial tears  1 application Both Eyes Q7H   B-complex with vitamin C  1 tablet Per Tube Daily   calcitRIOL  0.25 mcg Per Tube Once per day on Tue Thu Sat   carvedilol  12.5 mg Per Tube BID   chlorhexidine gluconate (MEDLINE KIT)  15 mL Mouth Rinse BID   Chlorhexidine Gluconate Cloth  6 each Topical Daily   darbepoetin (ARANESP) injection - DIALYSIS  60 mcg Subcutaneous Weekly   docusate  100 mg Per Tube BID   feeding supplement (PROSource TF)  90 mL Per Tube TID   feeding supplement (VITAL 1.5 CAL)  1,000 mL Per Tube Q24H   heparin injection (subcutaneous)  5,000 Units Subcutaneous Q8H   hydrALAZINE  50 mg Per Tube Q8H   insulin aspart  3-9 Units Subcutaneous Q4H   insulin aspart  7 Units Subcutaneous Q4H   insulin detemir  20 Units Subcutaneous Q12H   mouth rinse  15 mL Mouth Rinse 10 times per day   pantoprazole (PROTONIX) IV  40 mg Intravenous QHS   scopolamine  1 patch Transdermal Q72H   sodium chloride flush  10-40 mL Intracatheter Q12H   sodium chloride flush  5 mL Intracatheter Q8H   valproic acid  500 mg Per Tube TID    Dialysis Orders: TTS [place] [facility])  4h 26mn   70.5kg  400/500   2/2.25 bath  TDC  Hep 1000  - calc 0.255m PO q HD  - mircera 50 q4 last 5/31  - venofer 50 /wk  - sensipar 12016mO q HD  Assessment/Plan: S/p cardiac arrest, prolonged time until ROSC 06/22/20.   Anoxic brain injury-  MRI confirms and is bad Acute cholecysttis- CCS following s/p  perc drain 6/21; cefepime  flagyl ESRD: R IJ TDC. [place].   CRRT from 6/21-6/24 , IHD 6/25- BUN rising as is catabolic. HD today aborted due to cardiac arrest. Pt is now terminal, awaiting family members to arrive.    Hypotension - better, now off pressors  Anemia: Hgb stable,  no meds , Hb 8s CKD-BMD Ca ok,  Mild/mod hyponatremia: resolved  Sol Blazing, MD  [place] 06/30/2020,12:07 PM  LOS: 10 days

## 2020-06-30 NOTE — Procedures (Signed)
Cardiopulmonary Resuscitation Note  Daniel Thomas  [id number]  1955/05/13  Date:06/30/20  Time:9:36 AM   Provider Performing:Ethell Blatchford Cipriano Mile   Procedure: Cardiopulmonary Resuscitation [id number])  Indication(s) Loss of Pulse  Consent N/A  Anesthesia N/A  Time Out N/A  Sterile Technique Hand hygiene, gloves  Procedure Description Patient brady'd down during dialysis. Dialysis stopped. Atropine and epi given, deteriorated into PEA. CPR given then found to be in Vfib, shocked x 4.  Called to patient's room for CODE BLUE. Initial rhythm was PEA/Asystole. Patient received high quality chest compressions for 15 minutes with defibrillation or cardioversion when appropriate. Epinephrine was administered every 3 minutes as directed by time Therapist, nutritional. Additional pharmacologic interventions included amiodarone, calcium chloride, magnesium, and sodium bicarbonate. Additional procedural interventions include Return of spontaneous circulation was achieved.  Family at bedside.   Complications/Tolerance N/A  EBL N/A  Specimen(s) N/A  Estimated time to ROSC: 15 minutes

## 2020-06-30 NOTE — Procedures (Signed)
Arterial Catheter Insertion Procedure Note  MARKHAM DUMLAO  [id number]  03/01/1955  Date:06/30/20  Time:9:39 AM    Provider Performing: Candee Furbish    Procedure: Insertion of Arterial Line [id number]) with US guidance (98721)   Indication(s) Blood pressure monitoring and/or need for frequent ABGs  Consent Unable to obtain consent due to emergent nature of procedure.  Anesthesia None   Time Out Verified patient identification, verified procedure, site/side was marked, verified correct patient position, special equipment/implants available, medications/allergies/relevant history reviewed, required imaging and test results available.   Sterile Technique Maximal sterile technique including full sterile barrier drape, hand hygiene, sterile gown, sterile gloves, mask, hair covering, sterile ultrasound probe cover (if used).   Procedure Description Area of catheter insertion was cleaned with chlorhexidine and draped in sterile fashion. With real-time ultrasound guidance an arterial catheter was placed into the right radial artery.  Appropriate arterial tracings confirmed on monitor.     Complications/Tolerance Went in and worked for a bit then stopped working during chest compressions.   EBL Minimal   Specimen(s) None

## 2020-06-30 NOTE — Progress Notes (Signed)
Dialysis Nurse's Note Hemodialysis started per Nephrology orders at 0755 with stable vs. Unable to remove fluid at start of tx dt soft Bps.   At [place], RN present when BP noted at 87/42. Just before this RN was going to give NS bolus, HR started decreasing into the 40s and quickly into the 30's. This RN informed the bedside RN that blood will be returned and tx terminated dt deteriorating status of pt. Shortly after blood returned, pt continued to deteriorate and code was called. Dr. Jonnie Finner was notified of pt's condition and that tx was terminated.

## 2020-06-30 NOTE — Progress Notes (Signed)
Tenuous over past 2 hours with cardiac arrest; intermittent pushes of epi given, intermittent compressions, shocked a few, times as well out of Vfib, now on extremely high doses of pressors.  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  Nephew and sister in law at bedside, updated throughout that this is terminal.  We will continue aggressive care until brother can get to bedside then allow Dantonio to pass in peace.  Additional 55 minutes cc time with above

## 2020-06-30 NOTE — Progress Notes (Signed)
NAME:  Daniel Thomas, MRN:  [id number], DOB:  [birth year], LOS: 75 ADMISSION DATE:  06/12/2020, CONSULTATION DATE:  6/20 REFERRING MD:  Dr. Thereasa Solo, CHIEF COMPLAINT:  N/V; Post Cardiac Arrest   History of Present Illness:  Patient is a 65 yo M with pertinent PMH of COPD, ESRD on dialysis, DM, Heart murmur, HTN, and hyperlipidemia presenting to [facility] on 6/18 with N/V.  Patient arrived to [facility] on 6/18 stating his N/V began (6/17) during the night. He has not missed any dialysis treatments except for today. Pertinent ED labs show Creatinine of 9.6, Lipase 108. Nephrology consulted for ESRD. RUQ ultrasound show cholithiasis and patient has positive Murphy's sign. Surgery consulted. Patient declined surgery and perc tube placement at this time.   On 6/20, patient underwent CT abdomen with IV/oral contrast to evaluate gallbladder. While in CT patient lost pulse and cardiac arrested for 30 minutes before ROSC. Patient tongue appeared swollen and required intubation with a 6.5 ET tube due to the swelling. Epi, steroids, calcium, mag, and bicarb were given. One shock was given during one intermission with V-fib on rhythm check.   PCCM consulted on 6/20 for post cardiac arrest care and mechanical ventilator/airway management.  Pertinent  Medical History   Past Medical History:  Diagnosis Date   COPD (chronic obstructive pulmonary disease) ([place])    Diabetes mellitus without complication ([place])    Enlarged prostate    ESRD on dialysis ([place])    Heart murmur    High cholesterol    Hypertension     Significant Hospital Events: Including procedures, antibiotic start and stop dates in addition to other pertinent events   6/18: admitted to [facility] with symptomatic cholelithiasis 6/20: CT abdomen with oral contrast ordered; patient cardiac arrested x30 minutes until ROSC and appeared anaphylactic from contrast with swollen tongue. Intubated with 6.5 ETT. Admitted to ICU. Patient went bradycardic and cardiac  arrested again and given 1 atropine, 1 epi, and bicarb given; ROSC after 2 mintues. Myoclonic activity: EEG pending. 6/21: Initiated on CRRT.  Percutaneous cholecystostomy 6/22 CT H  with signs of anoxic/hypoxic injury  6/23 discussed neuro prognostication with family. CCS plan to s/o  6/24 Best exam is extensor response to suctioning, insulin drip for hyperglycemia 6/25 insulin drip discontinued, MRI  Interim History / Subjective:  Having myoclonic jerking. Remains unresponsive on vent.  Objective   Blood pressure (!) 106/58, pulse 75, temperature (!) 97.52 F (36.4 C), resp. rate 20, height 5\' 2"  (1.575 m), weight 81.8 kg, SpO2 100 %.    Vent Mode: PRVC FiO2 (%):  [30 %] 30 % Set Rate:  [12 bmp] 12 bmp Vt Set:  [550 mL] 550 mL PEEP:  [5 cmH20] 5 cmH20 Plateau Pressure:  [18 cmH20-25 cmH20] 19 cmH20   Intake/Output Summary (Last 24 hours) at 06/30/2020 0716 Last data filed at 06/30/2020 0600 Gross per 24 hour  Intake 1231.78 ml  Output 90 ml  Net 1141.78 ml    Filed Weights   06/28/20 0500 06/29/20 0500 06/30/20 0500  Weight: 79.4 kg 80.4 kg 81.8 kg    Examination: Constitutional: unresponsive man on vent  Eyes: mild nystagmus, see neuro Ears, nose, mouth, and throat: ETT in place, minimal secretions Cardiovascular: RRR, ext warm Respiratory: mechanical breath sounds, triggering vent, +accessory muscle use Gastrointestinal: soft, RUQ drain in place, hypoactive bS Skin: No rashes, normal turgor Neurologic:  GCS3 No corneals + doll's Pupils sluggish, equal No cough or gag Triggers vent Twitching movement of eyelids  Psychiatric: cannot assess  BUN up, to get HD today  Resolved Hospital Problem list     Assessment & Plan:   Severe anoxic brain injury S/p Cardiac arrest with ROSC  Acute respiratory failure with hypoxia  Hx COPD  Sepsis due to acute cholecystitis s/p perc cholecystostomy with IR 6/21 ESRD w/ vasoplegia during HD Anemia of chronic renal  disease Dm2, uncontrolled hyperglycemia Hx HTN Persistent leukocytosis  - Terminal prognosis, family having trouble coming to terms - Family meeting planned later this week - Continue vent support, HD, abx in interim; mental status precludes safe extubation; not a candidate for trach/PEG from my standpoint - Propofol and PRN ativan for apparent myoclonus vs. Seizures, treating these will not alter prognosis  Patient Lines/Drains/Airways Status     Active Line/Drains/Airways     Name Placement date Placement time Site Days   Peripheral IV 06/22/20 20 G 1.88" Left;Anterior Forearm 06/22/20  1108  Forearm  8   Peripheral IV 06/28/20 20 G 1.88" Anterior;Left;Proximal Forearm 06/28/20  0800  Forearm  2   Hemodialysis Catheter Right Internal jugular Double lumen Permanent (Tunneled) 12/10/19  1524  Internal jugular  203   Closed System Drain Right;Lateral RUQ 10 Fr. 06/23/20  1122  RUQ  7   NG/OG Vented/Dual Lumen Orogastric Oral Marking at nare/corner of mouth 06/22/20  2230  Oral  8   Airway 6.5 mm 06/22/20  2110  -- 8   Wound / Incision (Open or Dehisced) 06/26/20 Other (Comment) Pretibial Proximal;Right Detached Blister 06/26/20  1300  Pretibial  4             Best Practice (right click and "Reselect all SmartList Selections" daily)   Diet/type: TF Pain/Anxiety/Delirium protocol prop and PRN ativan VAP protocol (if indicated): Yes DVT prophylaxis: LMWH GI prophylaxis: H2B Glucose control:  SSI Foley:  N/A Mobility:  bed rest  PT consulted: N/A  Code Status:  full code Last date of multidisciplinary goals of care discussion will work on arranging with palliative Disposition: remains critically ill, will stay in intensive care   Patient critically ill due to anoxic brain injury, respiratory failure Interventions to address this today family discussions, vent titration Risk of deterioration without these interventions is high  I personally spent 32 minutes providing  critical care not including any separately billable procedures  Erskine Emery MD [place] [facility]  Prefer epic messenger for cross cover needs If after hours, please call E-link

## 2020-06-30 NOTE — Progress Notes (Signed)
Pharmacy Antibiotic Note  Daniel Thomas is a 65 y.o. male admitted on 06/30/2020 with acute cholecystitis.  Pharmacy has been consulted for cefepime and vancomycin  dosing.  Tmax 99.9, wbc down to 38.8. Myoclonic jerking noted this morning. Palliative planning family meeting later this week.  Plan: Vancomyicn 750 with HD sessions, next planned for today Cefepime 2gm IV q24h Metrodinazole 500mg  IV q8h  Height: 5\' 2"  (157.5 cm) Weight: 81.8 kg (180 lb 5.4 oz) IBW/kg (Calculated) : 54.6  Temp (24hrs), Avg:99.2 F (37.3 C), Min:97.52 F (36.4 C), Max:99.9 F (37.7 C)  Recent Labs  Lab 06/25/20 0352 06/25/20 1712 06/26/20 0334 06/26/20 1718 06/27/20 0445 06/27/20 1622 06/28/20 0430 06/29/20 0657 06/29/20 1601  WBC 25.8*  --  32.5*  --  43.2*  --  40.4* 38.8*  --   CREATININE 3.20*   < > 2.27*   < > 4.36* 3.20* 4.52* 7.03* 7.46*   < > = values in this interval not displayed.     Estimated Creatinine Clearance: 9.3 mL/min (A) (by C-G formula based on SCr of 7.46 mg/dL (H)).    Allergies  Allergen Reactions   Contrast Media [Iodinated Diagnostic Agents] Anaphylaxis    Cardiac arrest during a contrasted CT on 06/22/20. Airway edema noted during intubation.    Tape Dermatitis and Rash    6/25 BCx x 2: NGTD 6/25 Resp Cx: - reincubated 6/21 MRSA PCR +   Cefepime 6/18>>(7/1) Flagyl 6/18>>(7/1) Vanc 6/25 >  Erin Hearing PharmD., BCPS Clinical Pharmacist 06/30/2020 7:43 AM

## 2020-07-01 LAB — RENAL FUNCTION PANEL
Albumin: 2 g/dL — ABNORMAL LOW (ref 3.5–5.0)
Albumin: 1.9 g/dL — ABNORMAL LOW (ref 3.5–5.0)
Anion gap: 23 — ABNORMAL HIGH (ref 5–15)
Anion gap: 18 — ABNORMAL HIGH (ref 5–15)
BUN: 159 mg/dL — ABNORMAL HIGH (ref 8–23)
BUN: 104 mg/dL — ABNORMAL HIGH (ref 8–23)
CO2: 14 mmol/L — ABNORMAL LOW (ref 22–32)
CO2: 19 mmol/L — ABNORMAL LOW (ref 22–32)
Calcium: 8.1 mg/dL — ABNORMAL LOW (ref 8.9–10.3)
Calcium: 8.1 mg/dL — ABNORMAL LOW (ref 8.9–10.3)
Chloride: 101 mmol/L (ref 98–111)
Chloride: 102 mmol/L (ref 98–111)
Creatinine, Ser: 7.65 mg/dL — ABNORMAL HIGH (ref 0.61–1.24)
Creatinine, Ser: 5.41 mg/dL — ABNORMAL HIGH (ref 0.61–1.24)
GFR, Estimated: 7 mL/min — ABNORMAL LOW (ref >60)
GFR, Estimated: 11 mL/min — ABNORMAL LOW (ref >60)
Glucose, Bld: 45 mg/dL — ABNORMAL LOW (ref 70–99)
Glucose, Bld: 53 mg/dL — ABNORMAL LOW (ref 70–99)
Phosphorus: 9.4 mg/dL — ABNORMAL HIGH (ref 2.5–4.6)
Phosphorus: 6.2 mg/dL — ABNORMAL HIGH (ref 2.5–4.6)
Potassium: 4.9 mmol/L (ref 3.5–5.1)
Potassium: 3.7 mmol/L (ref 3.5–5.1)
Sodium: 138 mmol/L (ref 135–145)
Sodium: 139 mmol/L (ref 135–145)

## 2020-07-01 LAB — BPAM RBC
Blood Product Expiration Date: 202207302359
ISSUE DATE / TIME: 202206281851
Unit Type and Rh: 8400

## 2020-07-01 LAB — TYPE AND SCREEN
ABO/RH(D): AB POS
Antibody Screen: NEGATIVE
Unit division: 0

## 2020-07-01 LAB — MAGNESIUM: Magnesium: 3.6 mg/dL — ABNORMAL HIGH (ref 1.7–2.4)

## 2020-07-01 LAB — CBC
HCT: 24 % — ABNORMAL LOW (ref 39.0–52.0)
Hemoglobin: 8.3 g/dL — ABNORMAL LOW (ref 13.0–17.0)
MCH: 32 pg (ref 26.0–34.0)
MCHC: 34.6 g/dL (ref 30.0–36.0)
MCV: 92.7 fL (ref 80.0–100.0)
Platelets: 320 10*3/uL (ref 150–400)
RBC: 2.59 MIL/uL — ABNORMAL LOW (ref 4.22–5.81)
RDW: 15.4 % (ref 11.5–15.5)
WBC: 35.1 10*3/uL — ABNORMAL HIGH (ref 4.0–10.5)
nRBC: 0.1 % (ref 0.0–0.2)

## 2020-07-01 LAB — GLUCOSE, CAPILLARY
Glucose-Capillary: 148 mg/dL — ABNORMAL HIGH (ref 70–99)
Glucose-Capillary: 37 mg/dL — CL (ref 70–99)
Glucose-Capillary: 72 mg/dL (ref 70–99)
Glucose-Capillary: 76 mg/dL (ref 70–99)
Glucose-Capillary: 77 mg/dL (ref 70–99)
Glucose-Capillary: 81 mg/dL (ref 70–99)
Glucose-Capillary: 88 mg/dL (ref 70–99)
Glucose-Capillary: 98 mg/dL (ref 70–99)

## 2020-07-01 LAB — HEPATITIS B SURFACE ANTIBODY, QUANTITATIVE: Hep B S AB Quant (Post): 820.5 m[IU]/mL (ref >9.9)

## 2020-07-01 LAB — VALPROIC ACID LEVEL: Valproic Acid Lvl: 25 ug/mL — ABNORMAL LOW (ref 50.0–100.0)

## 2020-07-01 MED ORDER — SODIUM BICARBONATE 8.4 % IV SOLN
INTRAVENOUS | Status: AC
  Filled 2020-07-01: qty 100

## 2020-07-01 MED ORDER — EPINEPHRINE 1 MG/10ML IJ SOSY
PREFILLED_SYRINGE | INTRAMUSCULAR | Status: AC
  Filled 2020-07-01: qty 10

## 2020-07-01 MED ORDER — HEPARIN SODIUM (PORCINE) 1000 UNIT/ML IJ SOLN
INTRAMUSCULAR | Status: AC
  Filled 2020-07-01: qty 1

## 2020-07-01 MED ORDER — DEXTROSE 50 % IV SOLN
INTRAVENOUS | Status: AC
  Administered 2020-07-01: 50 mL
  Filled 2020-07-01: qty 50

## 2020-07-01 MED ORDER — INSULIN DETEMIR 100 UNIT/ML ~~LOC~~ SOLN
5.0000 [IU] | Freq: Two times a day (BID) | SUBCUTANEOUS | Status: DC
  Administered 2020-07-01 (×2): 5 [IU] via SUBCUTANEOUS
  Filled 2020-07-01 (×5): qty 0.05

## 2020-07-01 NOTE — Progress Notes (Signed)
NAME:  Daniel Thomas, MRN:  [id number], DOB:  1955/12/25, LOS: 29 ADMISSION DATE:  06/08/2020, CONSULTATION DATE:  6/20 REFERRING MD:  Dr. Thereasa Solo, CHIEF COMPLAINT:  N/V; Post Cardiac Arrest   History of Present Illness:  Patient is a 65 yo M with pertinent PMH of COPD, ESRD on dialysis, DM, Heart murmur, HTN, and hyperlipidemia presenting to [facility] on 6/18 with N/V.  Patient arrived to [facility] on 6/18 stating his N/V began (6/17) during the night. He has not missed any dialysis treatments except for today. Pertinent ED labs show Creatinine of 9.6, Lipase 108. Nephrology consulted for ESRD. RUQ ultrasound show cholithiasis and patient has positive Murphy's sign. Surgery consulted. Patient declined surgery and perc tube placement at this time.   On 6/20, patient underwent CT abdomen with IV/oral contrast to evaluate gallbladder. While in CT patient lost pulse and cardiac arrested for 30 minutes before ROSC. Patient tongue appeared swollen and required intubation with a 6.5 ET tube due to the swelling. Epi, steroids, calcium, mag, and bicarb were given. One shock was given during one intermission with V-fib on rhythm check.   PCCM consulted on 6/20 for post cardiac arrest care and mechanical ventilator/airway management.  Pertinent  Medical History   Past Medical History:  Diagnosis Date   COPD (chronic obstructive pulmonary disease) ([place])    Diabetes mellitus without complication ([place])    Enlarged prostate    ESRD on dialysis ([place])    Heart murmur    High cholesterol    Hypertension     Significant Hospital Events: Including procedures, antibiotic start and stop dates in addition to other pertinent events   6/18: admitted to [facility] with symptomatic cholelithiasis 6/20: CT abdomen with oral contrast ordered; patient cardiac arrested x30 minutes until ROSC and appeared anaphylactic from contrast with swollen tongue. Intubated with 6.5 ETT. Admitted to ICU. Patient went bradycardic and cardiac  arrested again and given 1 atropine, 1 epi, and bicarb given; ROSC after 2 mintues. Myoclonic activity: EEG pending. 6/21: Initiated on CRRT.  Percutaneous cholecystostomy 6/22 CT H  with signs of anoxic/hypoxic injury  6/23 discussed neuro prognostication with family. CCS plan to s/o  6/24 Best exam is extensor response to suctioning, insulin drip for hyperglycemia 6/25 insulin drip discontinued, MRI 6/28 Herniation event, cardiac arrest intermittently for an hour  Interim History / Subjective:  Stabilized hemodynamically after events yesterday.  Objective   Blood pressure (!) 117/55, pulse 63, temperature (!) 97.16 F (36.2 C), resp. rate 17, height 5\' 2"  (1.575 m), weight 85 kg, SpO2 100 %.    Vent Mode: PRVC FiO2 (%):  [30 %-100 %] 40 % Set Rate:  [12 bmp-21 bmp] 21 bmp Vt Set:  [550 mL] 550 mL PEEP:  [5 cmH20] 5 cmH20 Plateau Pressure:  [16 cmH20-33 cmH20] 16 cmH20   Intake/Output Summary (Last 24 hours) at 07/01/2020 0704 Last data filed at 07/01/2020 0500 Gross per 24 hour  Intake 3915.4 ml  Output 160 ml  Net 3755.4 ml    Filed Weights   06/29/20 0500 06/30/20 0500 07/01/20 0500  Weight: 80.4 kg 81.8 kg 85 kg    Examination: Constitutional: ill appearing man on vent  Eyes: pupils smaller than yesterday, still nonreactive Ears, nose, mouth, and throat: ETT in place with thick secretions Cardiovascular: RRR, ext lukewarm Respiratory: scattered rhonci, kussmaul breathing pattern Gastrointestinal: soft, drain in RUQ Skin: No rashes, normal turgor Neurologic: GCS3, triggers vent, no corneals/cough/doll's/gag Psychiatric: cannot assess  Hypoglycemic BUN up H/h responded  to 1 unit  Resolved Hospital Problem list     Assessment & Plan:   Severe anoxic brain injury S/p Cardiac arrest with ROSC; herniation on MRI without progression to brain death at present Acute respiratory failure with hypoxia  MRSA tracheiitis vs. pneumonia Hx COPD  Sepsis due to acute  cholecystitis s/p perc cholecystostomy with IR 6/21 ESRD w/ vasoplegia during HD Anemia of chronic renal disease Dm2, uncontrolled hyperglycemia Hx HTN  - Retrial of HD, if fails this no further HD indicated and patient will pass of his ESRD - Reduce insulin - No pressors with HD, if needs pressors he is not an HD candidate - If tolerates HD will get ethics consult - Vanc x 7 days - Continue vent support  Best Practice (right click and "Reselect all SmartList Selections" daily)   Diet/type: TF Pain/Anxiety/Delirium: on hold VAP protocol (if indicated): Yes DVT prophylaxis: LMWH GI prophylaxis: H2B Glucose control:  SSI Foley:  N/A Mobility:  bed rest  PT consulted: N/A  Code Status:  full code Last date of multidisciplinary goals of care discussion: daily, will reach out after attempt at HD Disposition: remains critically ill, will stay in intensive care   Patient critically ill due to anoxic brain injury, respiratory failure Interventions to address this today family discussions, vent titration Risk of deterioration without these interventions is high  I personally spent 43 minutes providing critical care not including any separately billable procedures  Erskine Emery MD [place] [facility]  Prefer epic messenger for cross cover needs If after hours, please call E-link

## 2020-07-01 NOTE — Progress Notes (Signed)
Pt only tolerated dialysis for 1h 32min, required 1250 total in fluid boluses in attempt to keep BP's up. HD aborted for BP in 80's despite above measures.    Kelly Splinter, MD 07/01/2020, 4:57 PM

## 2020-07-01 NOTE — Progress Notes (Signed)
Hemodialysis- BP improved to 139/61 post 500cc saline bolus per Dr. Jonnie Finner.

## 2020-07-01 NOTE — Progress Notes (Signed)
Patient maintaining MAP's in low 60's with HD. Levophed titrated on to assisted with BP. Dr Melvia Heaps made aware and gave orders to give 500cc bolus and discontinue pressors.

## 2020-07-01 NOTE — Progress Notes (Signed)
Dr. Jonnie Finner notified of dropping bp into 80s. Ordered additional 250cc saline bolus. Given.

## 2020-07-01 NOTE — Progress Notes (Signed)
[facility] Progress Note   Subjective: Pt seen in ICU, on vent    Objective Vitals:   07/01/20 1100 07/01/20 1101 07/01/20 1200 07/01/20 1300  BP: 128/60 (!) 137/53 (!) 126/58 (!) 124/56  Pulse: 69 69 69 71  Resp: 17 (!) _0 Temp: 98.42 F (36.9 C)  98.42 F (36.9 C) 98.78 F (37.1 C)  TempSrc:   Core   SpO2: 100% 100% 100% 100%  Weight:      Height:       Physical Exam General: intubated, sedated Heart: Regular Lungs: Coarse bs b/l Abdomen: Round, distended but soft, active bowel sounds Extremities: diffuse 1-2+ edema UE's/ LE's Dialysis Access: TDC R chest  NEURO: nonresponsive   Dialysis Orders: TTS [place] [facility] - Dr Smith Mince)  4h 12mn   70.5kg  400/500   2/2.25 bath  TDC  Hep 1000  - calc 0.211m PO q HD  - mircera 50 q4 last 5/31  - venofer 50 /wk  - sensipar 12031mO q HD  Assessment/Plan: S/p cardiac arrest- prolonged time until ROSC 06/22/20.   Anoxic brain injury-  MRI confirms  Acute cholecysttis- CCS following s/p perc drain 6/21; cefepime /flagyl ESRD: R IJ TDC. [facility] CRRT from 6/21-6/24 , IHD 6/25- BUN rising as is catabolic. HD today aborted 6/28 due to cardiac arrest. Family met w/ palliative care and family wants aggressive care. Will plan attempt at HD later, likely it will be overnight. Not CRRT candidate.   Hypotension - better, now off pressors  Anemia: Hgb stable,  no meds , Hb 8s CKD-BMD Ca ok,  Mild/mod hyponatremia: resolved  RobSol BlazingD  [place]29/2022,2:40 PM  LOS: 11 days   Filed Weights   06/29/20 0500 06/30/20 0500 07/01/20 0500  Weight: 80.4 kg 81.8 kg 85 kg    Intake/Output Summary (Last 24 hours) at 07/01/2020 1440 Last data filed at 07/01/2020 1300 Gross per 24 hour  Intake 4294.72 ml  Output 140 ml  Net 4154.72 ml     Additional Objective Labs: Basic Metabolic Panel: Recent Labs  Lab 06/30/20 0930 06/30/20 1150 06/30/20 1600  07/01/20 0437  NA 139 130* 135 138  K 4.0 5.0 4.7 4.9  CL 94*  --  101 101  CO2 16*  --  16* 14*  GLUCOSE 286*  --  217* 45*  BUN 128*  --  146* 159*  CREATININE 6.33*  --  6.80* 7.65*  CALCIUM 9.9  --  7.5* 8.1*  PHOS 10.8*  --  8.2* 9.4*    Liver Function Tests: Recent Labs  Lab 06/26/20 0334 06/26/20 1718 06/27/20 0445 06/27/20 1622 06/30/20 0930 06/30/20 1600 07/01/20 0437  AST 64*  --  50*  --  400*  --   --   ALT 122*  --  77*  --  313*  --   --   ALKPHOS 201*  --  157*  --  78  --   --   BILITOT 0.8  --  0.3  --  0.7  --   --   PROT 6.0*  --  5.7*  --  4.4*  --   --   ALBUMIN 2.5*   < > 2.3*   < > 1.7* 1.8* 2.0*   < > = values in this interval not displayed.    No results for input(s): LIPASE, AMYLASE in the last 168 hours.  CBC: Recent Labs  Lab 06/27/20 0445  06/28/20 0430 06/29/20 0657 06/30/20 0930 06/30/20 1150 06/30/20 1447 07/01/20 0437  WBC 43.2* 40.4* 38.8* 34.6*  --   --  35.1*  HGB 9.5* 8.2* 8.4* 6.3* 7.1* 6.4* 8.3*  HCT 28.5* 25.1* 25.5* 19.8* 21.0* 19.4* 24.0*  MCV 96.0 96.5 95.1 101.0*  --   --  92.7  PLT 339 322 380 280  --   --  320    Blood Culture    Component Value Date/Time   SDES BLOOD LEFT ANTECUBITAL 06/27/2020 1203   SPECREQUEST  06/27/2020 1203    BOTTLES DRAWN AEROBIC AND ANAEROBIC Blood Culture results may not be optimal due to an inadequate volume of blood received in culture bottles   CULT  06/27/2020 1203    NO GROWTH 4 DAYS Performed at [facility], [place] [address], [place], [place] [address]    REPTSTATUS PENDING 06/27/2020 1203    Cardiac Enzymes: No results for input(s): CKTOTAL, CKMB, CKMBINDEX, TROPONINI in the last 168 hours. CBG: Recent Labs  Lab 06/30/20 1554 06/30/20 2031 06/30/20 2323 07/01/20 0031 07/01/20 0744  GLUCAP 211* 95 75 81 88    Iron Studies: No results for input(s): IRON, TIBC, TRANSFERRIN, FERRITIN in the last 72 hours. Lab Results  Component Value Date   INR 1.1  12/05/2019   INR 1.1 05/12/2019   Studies/Results: No results found.  Medications:  amiodarone 30 mg/hr (07/01/20 1300)   epinephrine Stopped (06/30/20 1336)   levETIRAcetam Stopped (07/01/20 0149)   norepinephrine (LEVOPHED) Adult infusion Stopped (07/01/20 1505)   propofol (DIPRIVAN) infusion Stopped (06/30/20 0917)   vancomycin      amLODipine  10 mg Per Tube Daily   artificial tears  1 application Both Eyes W9V   B-complex with vitamin C  1 tablet Per Tube Daily   calcitRIOL  0.25 mcg Per Tube Once per day on Tue Thu Sat   chlorhexidine gluconate (MEDLINE KIT)  15 mL Mouth Rinse BID   Chlorhexidine Gluconate Cloth  6 each Topical Daily   darbepoetin (ARANESP) injection - DIALYSIS  60 mcg Subcutaneous Weekly   docusate  100 mg Per Tube BID   EPINEPHrine       feeding supplement (PROSource TF)  90 mL Per Tube TID   feeding supplement (VITAL 1.5 CAL)  1,000 mL Per Tube Q24H   heparin injection (subcutaneous)  5,000 Units Subcutaneous Q8H   hydrALAZINE  50 mg Per Tube Q8H   insulin detemir  5 Units Subcutaneous Q12H   mouth rinse  15 mL Mouth Rinse 10 times per day   pantoprazole (PROTONIX) IV  40 mg Intravenous Q12H   scopolamine  1 patch Transdermal Q72H   sodium bicarbonate       sodium chloride flush  10-40 mL Intracatheter Q12H   sodium chloride flush  5 mL Intracatheter Q8H   valproic acid  500 mg Per Tube TID

## 2020-07-01 NOTE — Progress Notes (Signed)
Did not tolerate HD. He is not a candidate for further RRT nor pressor intitiation. Will reach out to family regarding inevitable demise in AM. Would keep any CPR brief if even offered overnight, will reach out to [place] regarding.  Erskine Emery MD PCCM

## 2020-07-01 NOTE — Progress Notes (Signed)
Nutrition Follow-up  DOCUMENTATION CODES:   Not applicable  INTERVENTION:  Continue TF via OGT: Vital 1.5 @ goal of 70m/hr 963mProsource TF TID  Provides 2040 kcals, 147g protein, 91271mree water  Will monitor for decision [place] NUTRITION DIAGNOSIS:   Inadequate oral intake related to acute illness as evidenced by NPO status. -- ongoing  GOAL:   Patient will meet greater than or equal to 90% of their needs -- met with TF  MONITOR:   Vent status, Labs, Weight trends, Skin, TF tolerance  REASON FOR ASSESSMENT:   Consult, Ventilator Enteral/tube feeding initiation and management  ASSESSMENT:   [age] male admitted with N/V with symptomatic cholelithiasis and possible cholecystitis, cardiac arrest requiring intubation. PMH includes COPD, ESRD/HD, DM, heart murmor, HTN, HLD  6/18 Admitted 6/20 Cardiac arrest, Intubated 6/21 CRRT initiated, IR placed perc cholecystostomy tube 6/22 CT w/ signs of anoxic/hypoxic injury 6/24 insulin drip for hyperglycemia 6/25 insulin drip discontinued, MRI 6/28 Herniation event, cardiac arrest intermittently for an hour  Discussed pt with RN.   Per CCM, pt now stabilized hemodynamically after yesterday's herniation event and cardiac arrest. Herniation on MRI without progression to brain death at present. Pt remains sedated on vent. Plan for pt to have another trial of HD -- if unable to tolerate, pt will pass from ESRD. If pt is able to tolerate HD, CCM notes plan to consult ethics committee to assist with discussions regarding [place] currently receiving TF via OGT, orders as follows: Vital 1.5 @ 50 w/ 69m24mosource TF TID. Tolerating TF per RN.   EDW 70.5 kg  Current weight: 85 kg Admission weight: 75.1 kg  UOP: 25ml21mx unmeasured occurrences x24 hours Drain: 135ml 63mhours Stool: 1x unmeasured occurrence x24 hours  Medications: b-complex with vitamin c, rocaltrol, aranesp, colace, levemir, protonix Drips:  amiodarone, norepinephrine Labs: Cr 7.65 (H, up from yesterday). PO4 9.4 (H, up from yesterday), Mg 3.6 (H, lower than yesterday) CBGs 75-81-88  Diet Order:   Diet Order             Diet NPO time specified Except for: Sips with Meds  Diet effective now                   EDUCATION NEEDS:   Not appropriate for education at this time  Skin:  Skin Assessment: Reviewed RN Assessment  Last BM:  6/28  Height:   Ht Readings from Last 1 Encounters:  06/26/20 5' 2"  (1.575 m)    Weight:   Wt Readings from Last 1 Encounters:  07/01/20 85 kg   BMI:  Body mass index is 34.27 kg/m.  Estimated Nutritional Needs:   Kcal:  [id number]  Protein:  125-150 g  Fluid:  1000 mL plus UOP    AmandaLarkin InaRD, LDN (she/her/hers) RD pager number and weekend/on-call pager number located in [email or web address]

## 2020-07-01 NOTE — Progress Notes (Signed)
Hemodialysis- After approx 30 minutes on HD with ufr 150/hr patient dropping into 97L systolic. Spoke with Dr. Jonnie Finner as only 140cc removed thus far and bp trending down. Order to give 500cc saline bolus and keep UF off. Done.

## 2020-07-01 NOTE — Progress Notes (Signed)
Hemodialysis- BP drop into 70s after additional 250cc saline bolus. Patient rinsed back and treatment discontinued. Notified Dr. Jonnie Finner patient not tolerating. Received 1250cc saline with HD, unable to remove any fluid. ICU RN and staff at bedside. Given report. Primary MD at bedside and updated as well.

## 2020-07-02 LAB — RENAL FUNCTION PANEL
Albumin: 1.8 g/dL — ABNORMAL LOW (ref 3.5–5.0)
Albumin: 1.8 g/dL — ABNORMAL LOW (ref 3.5–5.0)
Anion gap: 19 — ABNORMAL HIGH (ref 5–15)
Anion gap: 20 — ABNORMAL HIGH (ref 5–15)
BUN: 128 mg/dL — ABNORMAL HIGH (ref 8–23)
BUN: 135 mg/dL — ABNORMAL HIGH (ref 8–23)
CO2: 19 mmol/L — ABNORMAL LOW (ref 22–32)
CO2: 18 mmol/L — ABNORMAL LOW (ref 22–32)
Calcium: 8 mg/dL — ABNORMAL LOW (ref 8.9–10.3)
Calcium: 7.8 mg/dL — ABNORMAL LOW (ref 8.9–10.3)
Chloride: 102 mmol/L (ref 98–111)
Chloride: 101 mmol/L (ref 98–111)
Creatinine, Ser: 6.68 mg/dL — ABNORMAL HIGH (ref 0.61–1.24)
Creatinine, Ser: 7.51 mg/dL — ABNORMAL HIGH (ref 0.61–1.24)
GFR, Estimated: 9 mL/min — ABNORMAL LOW (ref >60)
GFR, Estimated: 7 mL/min — ABNORMAL LOW (ref >60)
Glucose, Bld: 95 mg/dL (ref 70–99)
Glucose, Bld: 88 mg/dL (ref 70–99)
Phosphorus: 9.3 mg/dL — ABNORMAL HIGH (ref 2.5–4.6)
Phosphorus: 10.8 mg/dL — ABNORMAL HIGH (ref 2.5–4.6)
Potassium: 4.8 mmol/L (ref 3.5–5.1)
Potassium: 5.9 mmol/L — ABNORMAL HIGH (ref 3.5–5.1)
Sodium: 140 mmol/L (ref 135–145)
Sodium: 139 mmol/L (ref 135–145)

## 2020-07-02 LAB — CULTURE, BLOOD (ROUTINE X 2)
Culture: NO GROWTH
Culture: NO GROWTH

## 2020-07-02 LAB — CBC
HCT: 22.3 % — ABNORMAL LOW (ref 39.0–52.0)
Hemoglobin: 7.6 g/dL — ABNORMAL LOW (ref 13.0–17.0)
MCH: 32.5 pg (ref 26.0–34.0)
MCHC: 34.1 g/dL (ref 30.0–36.0)
MCV: 95.3 fL (ref 80.0–100.0)
Platelets: 300 10*3/uL (ref 150–400)
RBC: 2.34 MIL/uL — ABNORMAL LOW (ref 4.22–5.81)
RDW: 16.8 % — ABNORMAL HIGH (ref 11.5–15.5)
WBC: 36.8 10*3/uL — ABNORMAL HIGH (ref 4.0–10.5)
nRBC: 0.1 % (ref 0.0–0.2)

## 2020-07-02 LAB — GLUCOSE, CAPILLARY
Glucose-Capillary: 95 mg/dL (ref 70–99)
Glucose-Capillary: 31 mg/dL — CL (ref 70–99)
Glucose-Capillary: 26 mg/dL — CL (ref 70–99)
Glucose-Capillary: 124 mg/dL — ABNORMAL HIGH (ref 70–99)
Glucose-Capillary: 68 mg/dL — ABNORMAL LOW (ref 70–99)

## 2020-07-02 LAB — MAGNESIUM: Magnesium: 3.1 mg/dL — ABNORMAL HIGH (ref 1.7–2.4)

## 2020-07-02 LAB — VANCOMYCIN, RANDOM: Vancomycin Rm: 15

## 2020-07-02 MED ORDER — DEXTROSE 50 % IV SOLN
INTRAVENOUS | Status: AC
  Administered 2020-07-02: 50 mL
  Filled 2020-07-02: qty 50

## 2020-07-02 MED ORDER — DEXTROSE 10 % IV SOLN: INTRAVENOUS | Status: DC

## 2020-07-02 MED ORDER — SODIUM CHLORIDE 0.9 % IV SOLN
INTRAVENOUS | Status: DC | PRN
  Administered 2020-07-02: 500 mL via INTRAVENOUS

## 2020-07-02 MED ORDER — VANCOMYCIN HCL IN DEXTROSE 1-5 GM/200ML-% IV SOLN
1000.0000 mg | Freq: Once | INTRAVENOUS | Status: AC
  Administered 2020-07-02: 1000 mg via INTRAVENOUS
  Filled 2020-07-02: qty 200

## 2020-07-02 MED ORDER — STERILE WATER FOR INJECTION IJ SOLN
INTRAMUSCULAR | Status: AC
  Filled 2020-07-02: qty 10

## 2020-07-02 MED ORDER — HYDROCORTISONE NA SUCCINATE PF 100 MG IJ SOLR
50.0000 mg | Freq: Four times a day (QID) | INTRAMUSCULAR | Status: DC
  Administered 2020-07-02: 50 mg via INTRAVENOUS
  Filled 2020-07-02: qty 2

## 2020-07-02 MED ORDER — GLYCOPYRROLATE 0.2 MG/ML IJ SOLN
0.2000 mg | INTRAMUSCULAR | Status: DC
  Administered 2020-07-02 (×3): 0.2 mg via INTRAVENOUS
  Filled 2020-07-02 (×3): qty 1

## 2020-07-03 LAB — GLUCOSE, CAPILLARY: Glucose-Capillary: 106 mg/dL — ABNORMAL HIGH (ref 70–99)

## 2020-07-03 NOTE — Progress Notes (Signed)
Worsening hemodynamics through day.  Hemodynamic instability with any attempts at dialysis.   Also refractory hypoglycemia throughout day.   Terminal decline has started. Rn to notify family. We will offer chest compressions briefly understanding that we have reached medical futility.

## 2020-07-03 NOTE — Progress Notes (Signed)
Pts morning CBG 28  Treated with 25g/21mL 50% Dextrose injection.  Repeat CBG at 0800 was 106.

## 2020-07-03 NOTE — Procedures (Signed)
CODE BLUE    PEA arrest, later converted to asystole  Congruent with code status, chest compressions and BVM was performed.   These efforts were not successful in resuscitating the patient, and time of death was called at 17:07    Eliseo Gum MSN, AGACNP-BC [place] for pager  07/10/2020, 5:25 PM

## 2020-07-03 NOTE — Progress Notes (Signed)
NAME:  Daniel Thomas, MRN:  [id number], DOB:  March 02, 1955, LOS: 12 ADMISSION DATE:  06/19/2020, CONSULTATION DATE:  6/20 REFERRING MD:  Dr. Thereasa Solo, CHIEF COMPLAINT:  N/V; Post Cardiac Arrest   History of Present Illness:  Patient is a 65 yo M with pertinent PMH of COPD, ESRD on dialysis, DM, Heart murmur, HTN, and hyperlipidemia presenting to [facility] on 6/18 with N/V.  Patient arrived to [facility] on 6/18 stating his N/V began (6/17) during the night. He has not missed any dialysis treatments except for today. Pertinent ED labs show Creatinine of 9.6, Lipase 108. Nephrology consulted for ESRD. RUQ ultrasound show cholithiasis and patient has positive Murphy's sign. Surgery consulted. Patient declined surgery and perc tube placement at this time.   On 6/20, patient underwent CT abdomen with IV/oral contrast to evaluate gallbladder. While in CT patient lost pulse and cardiac arrested for 30 minutes before ROSC. Patient tongue appeared swollen and required intubation with a 6.5 ET tube due to the swelling. Epi, steroids, calcium, mag, and bicarb were given. One shock was given during one intermission with V-fib on rhythm check.   PCCM consulted on 6/20 for post cardiac arrest care and mechanical ventilator/airway management.  Pertinent  Medical History   Past Medical History:  Diagnosis Date   COPD (chronic obstructive pulmonary disease) ([place])    Diabetes mellitus without complication ([place])    Enlarged prostate    ESRD on dialysis ([place])    Heart murmur    High cholesterol    Hypertension     Significant Hospital Events: Including procedures, antibiotic start and stop dates in addition to other pertinent events   6/18: admitted to [facility] with symptomatic cholelithiasis 6/20: CT abdomen with oral contrast ordered; patient cardiac arrested x30 minutes until ROSC and appeared anaphylactic from contrast with swollen tongue. Intubated with 6.5 ETT. Admitted to ICU. Patient went bradycardic and cardiac  arrested again and given 1 atropine, 1 epi, and bicarb given; ROSC after 2 mintues. Myoclonic activity: EEG pending. 6/21: Initiated on CRRT.  Percutaneous cholecystostomy 6/22 CT H  with signs of anoxic/hypoxic injury  6/23 discussed neuro prognostication with family. CCS plan to s/o  6/24 Best exam is extensor response to suctioning, insulin drip for hyperglycemia 6/25 insulin drip discontinued, MRI 6/28 Herniation event, cardiac arrest intermittently for an hour 6/29 did not tolerate HD  Interim History / Subjective:  Stabilized hemodynamically after events yesterday.  Objective   Blood pressure (!) 110/53, pulse 69, temperature 99.68 F (37.6 C), resp. rate 20, height 5\' 2"  (1.575 m), weight 83.4 kg, SpO2 100 %.    Vent Mode: PSV;CPAP FiO2 (%):  [40 %] 40 % Set Rate:  [21 bmp] 21 bmp Vt Set:  [550 mL] 550 mL PEEP:  [5 cmH20] 5 cmH20 Pressure Support:  [8 cmH20-10 cmH20] 8 cmH20   Intake/Output Summary (Last 24 hours) at 07-16-2020 2947 Last data filed at 07-16-20 0600 Gross per 24 hour  Intake 1409.91 ml  Output -577 ml  Net 1986.91 ml    Filed Weights   07/01/20 1458 07/01/20 1656 2020-07-16 0414  Weight: 85 kg 86.2 kg 83.4 kg    Examination: Breathing in [place] pattern on vent GCS3 Lungs with wheezing bilaterally liikely from building fluid overload Pupils/corneals/dolls/cough/gag absent  Worsening renal faunction  Resolved Hospital Problem list     Assessment & Plan:   Severe anoxic brain injury S/p in hospital Cardiac arrest with ROSC; herniation on MRI without progression to brain death at present Acute  respiratory failure with hypoxia  MRSA tracheiitis vs. pneumonia Hx COPD  Sepsis due to acute cholecystitis s/p perc cholecystostomy with IR 6/21 ESRD on HD PTA Anemia of chronic renal disease Dm2 Hx HTN  No longer tolerating HD due to post arrest neurogenic shock Pressures slowly dropping Pressors will not meaningfully prolong life His death is  inevitable from multiorgan failure after hypoxemia-induced brain herniation Will try to get brother in and re-iterate futility of continued aggressive care Complete 7 days vanc Keep in PS, fights vent on any other setting due to neurogenic breathing In hospital death expected  Best Practice (right click and "Reselect all SmartList Selections" daily)   Diet/type: TF Pain/Anxiety/Delirium: on hold VAP protocol (if indicated): Yes DVT prophylaxis: LMWH GI prophylaxis: H2B Glucose control:  SSI Foley:  N/A Mobility:  bed rest  PT consulted: N/A  Code Status:  full code Last date of multidisciplinary goals of care discussion: daily, his VM was full this AM Disposition: remains critically ill, will stay in intensive care   Patient critically ill due to anoxic brain injury, respiratory failure Interventions to address this today family discussions, vent titration Risk of deterioration without these interventions is high  I personally spent 33 minutes providing critical care not including any separately billable procedures  Erskine Emery MD [place] [facility]  Prefer epic messenger for cross cover needs If after hours, please call E-link

## 2020-07-03 NOTE — Progress Notes (Signed)
CCM Brief Progress Note  65 yo M hx ESRD admitted 6/18 with n/v, suspected acute cholecystitis who sustained a cardiac arrest 6/20. Neurologic exam and workup in the days that followed revealed significant anoxic brain injury. Additional cardiac arrest 6/28 with iHD. He did not tolerate retry of iHD 6/29 with hypotension.   6/30 pt has had ongoing hypoglycemia- likely reflective of progressive multisystem organ failure -- and was started on dextrose infusion. In the afternoon, the patient's hemodynamics acutely declined, becoming bradycardic HR 50s, and hypotensive. CCM to bedside. Multiple attempts to reach family, [name], to update and request bedside presence were unfortunately not successful.   The patient continued to decline and ultimately suffered a PEA arrest, then asystole. Resuscitative efforts were ceased at 1707, and time of death was called at that time.   [name] called the unit  shortly after time of death was declared. I updated him regarding the pt's decline and death. He is en route to hospital, emotional support provided.      Eliseo Gum MSN, AGACNP-BC [place] for pager  07/07/20, 5:23 PM

## 2020-07-03 NOTE — Progress Notes (Signed)
Pharmacy Antibiotic Note  Daniel Thomas is a 65 y.o. male admitted on 06/24/2020 with acute cholecystitis.  Pharmacy has been consulted for cefepime and vancomycin  dosing.  Tmax 99.9, wbc down to 36. Patient cardiac arrest on 6/28 while trying to provide dialysis so it was aborted at that time. Attempted HD again yesterday but unsuccessful due to drop in blood pressure. Random vancomycin level this am is at low end of therapeutic goal at 15. Treating MRSA in trach aspirate, will give supplemental dose of vancomycin this morning and continue to follow renal plan for further dosing and levels.   Plan: Vancomyicn 1g IV now Will check random level in 48-72 hours unless patient receives renal replacement therapy Planning 7 days of treatment  Height: 5\' 2"  (157.5 cm) Weight: 83.4 kg (183 lb 13.8 oz) IBW/kg (Calculated) : 54.6  Temp (24hrs), Avg:97.9 F (36.6 C), Min:96.3 F (35.7 C), Max:99.86 F (37.7 C)  Recent Labs  Lab 06/28/20 0430 06/29/20 0657 06/29/20 1601 06/30/20 0930 06/30/20 1600 07/01/20 0437 07/01/20 1721 07-06-2020 0322 06-Jul-2020 0847  WBC 40.4* 38.8*  --  34.6*  --  35.1*  --  36.8*  --   CREATININE 4.52* 7.03*   < > 6.33* 6.80* 7.65* 5.41* 6.68*  --   LATICACIDVEN  --   --   --  10.5*  --   --   --   --   --   VANCORANDOM  --   --   --   --   --   --   --   --  15   < > = values in this interval not displayed.     Estimated Creatinine Clearance: 10.4 mL/min (A) (by C-G formula based on SCr of 6.68 mg/dL (H)).    Allergies  Allergen Reactions   Contrast Media [Iodinated Diagnostic Agents] Anaphylaxis    Cardiac arrest during a contrasted CT on 06/22/20. Airway edema noted during intubation.    Tape Dermatitis and Rash    6/25 BCx x 2: NG 6/25 Resp Cx: - MRSA 6/21 MRSA PCR +   Cefepime 6/18>>6/28 Flagyl 6/18>>6/28 Vanc 6/25 >  Erin Hearing PharmD., BCPS Clinical Pharmacist Jul 06, 2020 10:15 AM

## 2020-07-03 NOTE — Progress Notes (Signed)
[facility] Progress Note   Subjective: Pt seen in ICU, on vent    Objective Vitals:   07-13-2020 1030 July 13, 2020 1045 07-13-20 1100 13-Jul-2020 1150  BP:   (!) 115/54 (!) 118/44  Pulse: 81 79 78 75  Resp: 20 18 19  (!) 22  Temp: (!) 100.6 F (38.1 C) (!) 100.8 F (38.2 C) (!) 100.76 F (38.2 C)   TempSrc:      SpO2: 100% 100% 100% 100%  Weight:      Height:       Physical Exam General: intubated, sedated Heart: Regular Lungs: Coarse bs b/l Abdomen: Round, distended but soft, active bowel sounds Extremities: diffuse 1-2+ edema UE's/ LE's Dialysis Access: TDC R chest  NEURO: nonresponsive   Dialysis Orders: TTS [place] [facility] - Dr Smith Mince)  4h 37mn   70.5kg  400/500   2/2.25 bath  TDC  Hep 1000  - calc 0.271m PO q HD  - mircera 50 q4 last 5/31  - venofer 50 /wk  - sensipar 12037mO q HD  Assessment/Plan: S/p cardiac arrest- prolonged time until ROSC 06/22/20.   Anoxic brain injury-  MRI confirms  Acute cholecysttis- CCS following s/p perc drain 6/21; cefepime /flagyl ESRD: R IJ TDC. [facility] CRRT from 6/21-6/24 then switched to iHD.  HD was aborted 6/28 due to cardiac arrest which occurred shortly after putting patient on.  Attempted HD again yesterday 6.29 which was alsounsuccessful due to refractory hypotension. Pt is no longer a candidate for further RRT due to recurrent shock and unstable cardiac situation.  Anemia: Hgb stable,  no meds , Hb 8s CKD-BMD Ca ok,  Mild/mod hyponatremia: resolved  RobSol BlazingD  CarYosemite Lakes29/2022,2:40 PM  LOS: 11 days   FilRiddle Hospitalights   07/01/20 1458 07/01/20 1656 06/07-11-202214  Weight: 85 kg 86.2 kg 83.4 kg    Intake/Output Summary (Last 24 hours) at 6/307-11-2209 Last data filed at 6/307-11-2198 Gross per 24 hour  Intake 1122.27 ml  Output -507 ml  Net 1629.27 ml     Additional Objective Labs: Basic Metabolic Panel: Recent Labs  Lab 07/01/20 0437  07/01/20 1721 06/11-Jul-202222  NA 138 139 140  K 4.9 3.7 4.8  CL 101 102 102  CO2 14* 19* 19*  GLUCOSE 45* 53* 95  BUN 159* 104* 128*  CREATININE 7.65* 5.41* 6.68*  CALCIUM 8.1* 8.1* 8.0*  PHOS 9.4* 6.2* 9.3*    Liver Function Tests: Recent Labs  Lab 06/26/20 0334 06/26/20 1718 06/27/20 0445 06/27/20 1622 06/30/20 0930 06/30/20 1600 07/01/20 0437 07/01/20 1721 06/09/09/2222  AST 64*  --  50*  --  400*  --   --   --   --   ALT 122*  --  77*  --  313*  --   --   --   --   ALKPHOS 201*  --  157*  --  78  --   --   --   --   BILITOT 0.8  --  0.3  --  0.7  --   --   --   --   PROT 6.0*  --  5.7*  --  4.4*  --   --   --   --   ALBUMIN 2.5*   < > 2.3*   < > 1.7*   < > 2.0* 1.9* 1.8*   < > = values in this interval not displayed.    No  results for input(s): LIPASE, AMYLASE in the last 168 hours.  CBC: Recent Labs  Lab 06/28/20 0430 06/29/20 0657 06/30/20 0930 06/30/20 1150 06/30/20 1447 07/01/20 0437 07-12-2020 0322  WBC 40.4* 38.8* 34.6*  --   --  35.1* 36.8*  HGB 8.2* 8.4* 6.3*   < > 6.4* 8.3* 7.6*  HCT 25.1* 25.5* 19.8*   < > 19.4* 24.0* 22.3*  MCV 96.5 95.1 101.0*  --   --  92.7 95.3  PLT 322 380 280  --   --  320 300   < > = values in this interval not displayed.    Blood Culture    Component Value Date/Time   SDES BLOOD LEFT ANTECUBITAL 06/27/2020 1203   SPECREQUEST  06/27/2020 1203    BOTTLES DRAWN AEROBIC AND ANAEROBIC Blood Culture results may not be optimal due to an inadequate volume of blood received in culture bottles   CULT  06/27/2020 1203    NO GROWTH 5 DAYS Performed at [facility], [place] [address] [place], [place] [address]    REPTSTATUS July 12, 2020 FINAL 06/27/2020 1203    Cardiac Enzymes: No results for input(s): CKTOTAL, CKMB, CKMBINDEX, TROPONINI in the last 168 hours. CBG: Recent Labs  Lab 07/01/20 2334 07-12-2020 0325 2020-07-12 0732 2020-07-12 1104 12-Jul-2020 1208  GLUCAP 98 95 31* 26* 124*    Iron Studies: No results for  input(s): IRON, TIBC, TRANSFERRIN, FERRITIN in the last 72 hours. Lab Results  Component Value Date   INR 1.1 12/05/2019   INR 1.1 05/12/2019   Studies/Results: No results found.  Medications:  amiodarone 30 mg/hr (2020/07/12 0800)   dextrose 50 mL/hr at 12-Jul-2020 1157   epinephrine Stopped (06/30/20 1336)   levETIRAcetam Stopped (July 12, 2020 0237)   propofol (DIPRIVAN) infusion Stopped (06/30/20 0917)    amLODipine  10 mg Per Tube Daily   artificial tears  1 application Both Eyes D9R   B-complex with vitamin C  1 tablet Per Tube Daily   calcitRIOL  0.25 mcg Per Tube Once per day on Tue Thu Sat   chlorhexidine gluconate (MEDLINE KIT)  15 mL Mouth Rinse BID   Chlorhexidine Gluconate Cloth  6 each Topical Daily   darbepoetin (ARANESP) injection - DIALYSIS  60 mcg Subcutaneous Weekly   docusate  100 mg Per Tube BID   feeding supplement (PROSource TF)  90 mL Per Tube TID   feeding supplement (VITAL 1.5 CAL)  1,000 mL Per Tube Q24H   glycopyrrolate  0.2 mg Intravenous Q4H   heparin injection (subcutaneous)  5,000 Units Subcutaneous Q8H   hydrALAZINE  50 mg Per Tube Q8H   hydrocortisone sod succinate (SOLU-CORTEF) inj  50 mg Intravenous Q6H   insulin detemir  5 Units Subcutaneous Q12H   mouth rinse  15 mL Mouth Rinse 10 times per day   pantoprazole (PROTONIX) IV  40 mg Intravenous Q12H   scopolamine  1 patch Transdermal Q72H   sodium chloride flush  10-40 mL Intracatheter Q12H   sodium chloride flush  5 mL Intracatheter Q8H   valproic acid  500 mg Per Tube TID

## 2020-07-03 NOTE — Progress Notes (Signed)
Pts afternoon CBG at 16:13 was 68.  Treated with 25g/23mL 50% Dextrose injection.

## 2020-07-03 NOTE — Progress Notes (Addendum)
Pts heart rate in the 30s, BP MAP in 30's as well. Continued to get in touch with family but no answer.  Will continue to reach out to family.

## 2020-07-03 NOTE — Progress Notes (Addendum)
Patients heart rate and blood pressure declining.  MD notified.  Attempted to reach out to multiple family three times with no response; first call made to [facility] at 16:38. [place] as well.

## 2020-07-03 DEATH — deceased

## 2020-07-09 ENCOUNTER — Other Ambulatory Visit: Payer: Self-pay

## 2020-07-11 MED FILL — Medication: Qty: 1 | Status: AC

## 2020-07-14 ENCOUNTER — Ambulatory Visit: Payer: Self-pay | Admitting: Urology

## 2020-08-03 NOTE — Death Summary Note (Signed)
DEATH SUMMARY   Patient Details  Name: Daniel Thomas MRN: [id number] DOB: 1955/02/20  Admission/Discharge Information   Admit Date:  2020-07-05  Date of Death: Date of Death: 2020-07-17  Time of Death: Time of Death: Apr 09, 1705  Length of Stay: 03-30-2022  Referring Physician: Marguerita Merles, MD   Reason(s) for Hospitalization    Brief Hospital Course (including significant findings, care, treatment, and services provided and events leading to death)  Daniel Thomas is a 65 y.o. male with hx of end-stage renal disease on hospice, COPD, CHF, type 2 diabetes hypertension, who presents with vomiting.   Patient reports that last evening he had some tacos for dinner.  In the middle of the night he awoke with significant nausea and vomiting and had multiple bouts of emesis.  Had significant abdominal does endorse abdominal distention.  Since arriving to the hospital he has had multiple bowel movements as well but within normal.  He endorses a history of abdominal bloat and discomfort fatty heavy meals.  Prior history of problems with his gallbladder.  Denies fevers.  Has not missed any dialysis sessions recently except for the one that was scheduled today, no missed medications.   In the ED initial vital signs notable only for mild hypertension.  CMP was notable for creatinine of 9.6, normal AST and ALT, normal T bili, elevated lipase at 108.  CBC without leukocytosis, stable of 10.  Troponin was obtained and was mildly elevated at 60, downtrending to 57 on recheck 2 hours later.  EKG was obtained and was unchanged from prior.  Right upper quadrant ultrasound was obtained which showed mildly distended gallbladder with cholelithiasis as well as a positive Murphy sign concerning for acute cholecystitis.  He was treated symptomatically with Zofran with good effect.  General surgery was consulted regarding his gallbladder, nephrology was consulted regarding his ESRD, he was admitted to the medicine service for further  work-up and management.  Patient was offered cholecystectomy vs. Percutnaneous drainage for likely cholecystitis.  Patient refused so a CT of the abdomen was ordered to further confirm his gallbladder was the cause of his ongoing abdominal pain, nausea, and vomiting.  Patient went down to get his CT on 6/20 evening and unfortunately suffered a cardiac arrest in the scanner with reports of a swollen tongue during intubation.  He needed 26 minutes of CPR before ROSC and was treated empirically as an anaphylactic reaction to oral contrast.  He suffered another brief arrest that evening before stabilizing with multiple pushes of epinephrine.  IR was able to place a percutaneous cholecystostomy drain in the morning of 6/21.  EEG after arrest showed persistent anoxic myoclonic seizures suppressed with propofol, keppra, and Depakote.  Despite supportive care, patient continued to have poor neurological status and MRI on 6/25 showed diffuse cerebral edema with sulcal effacement on downward cerebellar herniation.  Multiple family meetings were held regarding prognosis with decisions to continue aggressive care.  In the morning of 6/28 while attempting dialysis, patient again suffered PEA arrest in setting of worsening pupil dilation, bradycardia consistent with worsening herniation.  He was revived with CPR and pushing epinephrine on and off for over 2 hours while brother could get to bedside.  He eventually settled out albeit with an even worse neurological exam, his only function being breathing over ventilator.  Dialysis was attempted again on 6/29 with hemodynamic instability and patient was no longer eligible for RRT in light of worsening hemodynamics after his 6/28 arrest.  On 2022-07-18, patient's  blood pressure continued to drop and he passed away from his illness despite chest compressions.  Numerous attempts were made to guide the family through this difficult situation.  Unfortunately there was a lot of  mistrust in our prior care for one of their other family members.  Multiple providers including myself, other critical care team members, nephrology, surgery, neurology, and palliative care tried to repair this relationship but were unable to do so.   Pertinent Labs and Studies  Significant Diagnostic Studies DG Abd 1 View  Result Date: 06/29/2020 CLINICAL DATA:  Abdominal pain EXAM: ABDOMEN - 1 VIEW COMPARISON:  June 22, 2020 FINDINGS: Nasogastric tube tip and side port in stomach. There is fairly mild stool volume in the colon. There is no bowel dilatation or air-fluid level to suggest bowel obstruction. No free air. Note that there is a pigtail drainage catheter in the right lateral abdomen. There are apparent phleboliths in the pelvis. IMPRESSION: Nasogastric tube tip and side port in stomach. Apparent drainage catheter in lateral right abdomen. No bowel obstruction or free air evident. Electronically Signed   By: Lowella Grip III M.D.   On: 06/29/2020 08:39   CT HEAD WO CONTRAST  Result Date: 06/24/2020 CLINICAL DATA:  Status post cardiac arrest. EXAM: CT HEAD WITHOUT CONTRAST TECHNIQUE: Contiguous axial images were obtained from the base of the skull through the vertex without intravenous contrast. COMPARISON:  April 29, 2020. FINDINGS: Brain: Decreased size of lenticular extra-axial collection along the high RIGHT posterior frontal and parietal convexity with mixed attenuation material layering dependently. No midline shift. No intraparenchymal hemorrhage. Tiny extra-axial collection, small subdural may be present along the LEFT convexity as well better seen on coronal images decreased in conspicuity compared to the previous exam no hydrocephalus. RIGHT sided extra-axial collection in coronal plane measuring 4.8 x 1.0 cm in greatest dimension as compared to 5.8 x 1.8 cm on the prior study. Less mass effect upon adjacent sulci (image 51/5) Very subtle area of extra-axial material on image 31 of  series 5 May correspond to previous presumed extra-axial collection/small subdural. Diminished conspicuity of gray-white differentiation may be present. Vascular: No hyperdense vessel or unexpected calcification. Skull: Normal. Negative for fracture or focal lesion. Sinuses/Orbits: Scattered ethmoid opacification and signs of sphenoid sinus disease. Other: None IMPRESSION: 1. Loss of gray-white differentiation subtle in most areas but more pronounced particularly in the basal ganglia on the RIGHT compatible with early changes related to hypoxic/ischemic injury. No herniation or significant changes of edema at this time. 2. Decreased size of mixed density extra-axial collection along the RIGHT posterior frontal parietal region with decreased mass effect. 3. Tiny extra-axial collection, small subdural may be present along the LEFT convexity as well better seen on coronal images decreased in conspicuity compared to the previous exam. 4. Potential diminished gray-white differentiation. 5. No midline shift. 6. Scattered ethmoid opacification and signs of sphenoid sinus disease. 7. Critical Value/emergent results were called by telephone at the time of interpretation on 06/24/2020 at 11:31 am to provider [facility] , who verbally acknowledged these results. Electronically Signed   By: Zetta Bills M.D.   On: 06/24/2020 10:58   MR BRAIN WO CONTRAST  Result Date: 06/27/2020 CLINICAL DATA:  Anoxic brain damage EXAM: MRI HEAD WITHOUT CONTRAST TECHNIQUE: Multiplanar, multiecho pulse sequences of the brain and surrounding structures were obtained without intravenous contrast. COMPARISON:  CT head June 24, 2020. FINDINGS: Brain: Edema and restricted diffusion involving the infratentorial and supratentorial cortex and bilateral deep gray nuclei,  compatible with severe hypoxic/ischemic injury. Ischemia/Restricted diffusion is most conspicuous in the bilateral parietal and occipital cortex and cerebellum. Resulting diffuse  sulcal effacement with crowding at the basal cisterns, posterior fossa and foramen magnum with approximately 3 mm of downward cerebellar tonsillar herniation. No substantial change in 11 mm thick right and trace left cerebral convexity hemorrhage. Similar mass effect. No hydrocephalus. No midline shift. No mass lesion. Vascular: Major arterial flow voids are maintained at the skull base. Skull and upper cervical spine: Normal marrow signal. Sinuses/Orbits: Moderate paranasal sinus mucosal thickening. Unremarkable orbits. Other: Large bilateral mastoid effusions. IMPRESSION: 1. Diffuse edema and restricted diffusion involving the infratentorial and supratentorial cortex and bilateral deep gray nuclei, compatible with severe hypoxic/ischemic injury. Resulting diffuse sulcal effacement with crowding of the posterior fossa and approximately 3 mm of downward cerebellar tonsillar herniation. 2. No substantial change in 11 mm thick right and trace left cerebral convexity hemorrhage with similar mass effect. These results will be called to the ordering clinician or representative by the Radiologist Assistant, and communication documented in the PACS or [employer]. Electronically Signed   By: Margaretha Sheffield MD   On: 06/27/2020 16:54   CT ABDOMEN PELVIS W CONTRAST  Result Date: 06/22/2020 CLINICAL DATA:  65 year old male with abdominal pain. EXAM: CT ABDOMEN AND PELVIS WITH CONTRAST TECHNIQUE: Multidetector CT imaging of the abdomen and pelvis was performed using the standard protocol following bolus administration of intravenous contrast. CONTRAST:  180mL OMNIPAQUE IOHEXOL 300 MG/ML  SOLN COMPARISON:  Abdominal ultrasound dated 06/18/2020. CT abdomen pelvis dated 11/10/2019. FINDINGS: Evaluation of this exam is limited due to respiratory motion artifact. Lower chest: Bibasilar linear atelectasis. There is coronary vascular calcification and calcification of the mitral annulus. Partially visualized central venous  line with tip at the cavoatrial junction. No intra-abdominal free air or free fluid. Hepatobiliary: The liver is unremarkable. No intrahepatic biliary dilatation. There is a stone the gallbladder. The gallbladder is distended. There is gallbladder wall edema with pericholecystic fluid and stranding. Findings most consistent with acute cholecystitis. Further evaluation with ultrasound recommended. Pancreas: Unremarkable. No pancreatic ductal dilatation or surrounding inflammatory changes. Spleen: There is an area of hypoenhancement involving the midportion of the spleen most concerning for splenic infarct. Adrenals/Urinary Tract: The adrenal glands unremarkable. Vascular calcification versus small nonobstructing bilateral renal calculi measure up to 3 mm in the lower pole of the right kidney. There is no hydronephrosis on either side. There is slight heterogeneous enhancement the renal parenchyma. Correlation with urinalysis recommended to exclude UTI. The visualized ureters and urinary bladder appear unremarkable. Stomach/Bowel: There is no bowel obstruction or active inflammation. The appendix is normal. Vascular/Lymphatic: Mild aortoiliac atherosclerotic disease. The IVC is unremarkable. No portal venous gas. There is no adenopathy. Reproductive: Enlarged prostate gland measuring 5 cm in transverse axial diameter. The seminal vesicles are symmetric. Other: Small fat containing bilateral inguinal hernia. There is slight protrusion of sigmoid colon into the left inguinal canal. Musculoskeletal: Osteopenia with degenerative changes of the spine. Old L1 compression fracture. There is compression fracture of inferior endplate of M84, new since the prior CT. Correlation with clinical exam and point tenderness recommended. IMPRESSION: 1. Cholelithiasis with findings most consistent with acute cholecystitis. Further evaluation with ultrasound recommended. 2. 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 3. No bowel obstruction. Normal  appendix. 4. Aortic Atherosclerosis (ICD10-I70.0). Electronically Signed   By: Anner Crete M.D.   On: 06/22/2020 21:28   IR Perc Cholecystostomy  Result Date: 06/23/2020 INDICATION: Acute cholecystitis. Please perform image guided cholecystostomy tube  placement for infection source control purposes. EXAM: ULTRASOUND AND FLUOROSCOPIC-GUIDED CHOLECYSTOSTOMY TUBE PLACEMENT COMPARISON:  CT abdomen and pelvis-06/22/2020; right upper quadrant abdominal ultrasound-06/12/2020 MEDICATIONS: The patient is currently admitted to the hospital and on intravenous antibiotics. Antibiotics were administered within an appropriate time frame prior to skin puncture. ANESTHESIA/SEDATION: Patient is currently intubated and sedated in the ICU. CONTRAST:  None, no contrast was administered secondary to patient's anaphylactic reaction to intravenous contrast during abdominal CT performed 06/22/2020. FLUOROSCOPY TIME:  18 seconds (3 mGy) COMPLICATIONS: None immediate. PROCEDURE: Informed written consent was obtained from the patient's family after a discussion of the risks, benefits and alternatives to treatment. Questions regarding the procedure were encouraged and answered. A timeout was performed prior to the initiation of the procedure. The right upper abdominal quadrant was prepped and draped in the usual sterile fashion, and a sterile drape was applied covering the operative field. Maximum barrier sterile technique with sterile gowns and gloves were used for the procedure. A timeout was performed prior to the initiation of the procedure. Local anesthesia was provided with 1% lidocaine with epinephrine. Ultrasound scanning of the right upper quadrant demonstrates a markedly dilated gallbladder with gallbladder wall thickening and minimal amount of pericholecystic fluid. Utilizing a transhepatic approach, a 22 gauge needle was advanced into the gallbladder under direct ultrasound guidance. An ultrasound image was saved for  documentation purposes. Appropriate intraluminal puncture was confirmed with the efflux of bile and advancement of an 0.018 wire into the gallbladder lumen. The needle was exchanged for an Steinauer set. A small amount of contrast was injected to confirm appropriate intraluminal positioning. Over a Benson wire, a 71.2-French Cook cholecystomy tube was advanced into the gallbladder fossa, coiled and locked. Bile was aspirated and several post procedural spot radiographic images were obtained in various obliquities. Again, no contrast was administered secondary to patient's anaphylaxis to intravenous contrast received during abdominal CT performed 06/22/2020. The catheter was secured to the skin with suture, connected to a drainage bag and a dressing was placed. The patient tolerated the procedure well without immediate post procedural complication. IMPRESSION: Successful ultrasound and fluoroscopic guided placement of a 10.2 French cholecystostomy tube. Electronically Signed   By: Sandi Mariscal M.D.   On: 06/23/2020 14:26   DG Chest Port 1 View  Result Date: 06/28/2020 CLINICAL DATA:  Acute respiratory failure. EXAM: PORTABLE CHEST 1 VIEW COMPARISON:  Chest x-ray June 26, 2020. FINDINGS: Endotracheal tube approximately 3.6 cm from the carina perhaps advanced slightly since previous imaging. Gastric tube courses through in off field of the radiograph. Side port below GE junction. Dual lumen catheter entering via RIGHT-sided approach tip in the RIGHT atrium. Cardiomediastinal contours and hilar structures are normal. Lungs are clear. No pneumothorax or effusion. EKG leads project over the chest. On limited assessment no acute skeletal process. IMPRESSION: 1. Possible interval slight advancement of endotracheal tube. 2. No sign of opacity near the RIGHT hilum on the current study. 3. No acute cardiopulmonary process. Electronically Signed   By: Zetta Bills M.D.   On: 06/28/2020 09:40   DG CHEST PORT 1  VIEW  Result Date: 06/26/2020 CLINICAL DATA:  Respiratory failure EXAM: PORTABLE CHEST 1 VIEW COMPARISON:  06/24/2020 FINDINGS: Endotracheal tube is been withdrawn, now 6.1 cm above the carina. Nasogastric tube extends into the upper abdomen beyond the margin of the examination. Right internal jugular hemodialysis catheter tip noted at the superior cavoatrial junction. The lungs are symmetrically well expanded. Focal opacity within the right infrahilar region may represent right  middle lobe atelectasis or infiltrate. The lungs are otherwise clear. No pneumothorax or pleural effusion. Cardiac size within normal limits. Pulmonary vascularity is normal. IMPRESSION: Support lines and tubes in appropriate position. Improved pulmonary insufflation. Focal opacity within the right infrahilar region, atelectasis versus infiltrate. Electronically Signed   By: Fidela Salisbury MD   On: 06/26/2020 02:10   DG CHEST PORT 1 VIEW  Result Date: 06/24/2020 CLINICAL DATA:  Respiratory failure.  Intubation. EXAM: PORTABLE CHEST 1 VIEW COMPARISON:  06/22/2020. FINDINGS: Endotracheal tube tip 1.5 cm above the carina. NG tube tip below left hemidiaphragm. Dialysis catheter in stable position. Cardiomegaly. No pulmonary venous congestion. Low lung volumes with prominent bibasilar subsegmental atelectasis noted on today's exam. No pleural effusion or pneumothorax. IMPRESSION: Endotracheal tube tip 1.5 cm above the carina. NG tube tip below left hemidiaphragm. Dialysis catheter stable position. 2.  Cardiomegaly.  No pulmonary venous congestion. 3. Low lung volumes with prominent bibasilar subsegmental atelectasis noted on today's exam. Electronically Signed   By: Marcello Moores  Register   On: 06/24/2020 07:13   DG CHEST PORT 1 VIEW  Result Date: 06/22/2020 CLINICAL DATA:  Emergent intubation with NG tube placement. EXAM: PORTABLE CHEST 1 VIEW COMPARISON:  Chest radiograph 12/05/2019 FINDINGS: Endotracheal tube tip is 3.7 cm from the carina.  Tip and side port of the enteric tube below the diaphragm in the stomach there is a right internal jugular dialysis catheter with tip at the atrial caval junction. Previous left-sided dialysis catheter is no longer seen. Stable heart size and mediastinal contours vascular congestion. No focal airspace disease. No pneumothorax. No acute osseous abnormalities are seen. IMPRESSION: 1. Endotracheal tube tip 3.7 cm from the carina. Enteric tube in place. 2. Right internal jugular dialysis catheter with tip at the atrial caval junction. 3. Vascular congestion. Electronically Signed   By: Keith Rake M.D.   On: 06/22/2020 22:42   DG Abd Portable 1V  Result Date: 06/22/2020 CLINICAL DATA:  NG tube placement. EXAM: PORTABLE ABDOMEN - 1 VIEW COMPARISON:  Abdominopelvic CT earlier today FINDINGS: Tip and side port of the enteric tube below the diaphragm in the stomach enteric contrast is seen throughout the colon from prior CT. No free air or obstruction. Known gallstone is not well visualized IMPRESSION: Tip and side port of the enteric tube below the diaphragm in the stomach. Electronically Signed   By: Keith Rake M.D.   On: 06/22/2020 22:42   EEG adult  Result Date: 06/23/2020 Greta Doom, MD     06/23/2020  1:54 AM History: 65 yo M s/p cardiac arrest. Sedation: Propofol 5 mcg/kg/min Technique: This is a 21 channel routine scalp EEG performed at the bedside with bipolar and monopolar montages arranged in accordance to the international 10/20 system of electrode placement. One channel was dedicated to EKG recording. Background: The background is diffusely attenuated with occasional epileptiform bursts lasting 1-2 seconds associated with generalized myoclonus.  Photic stimulation: Physiologic driving is not performed EEG Abnormalities: 1) Burst suppression EEG with myoclonus assocaited with bursts Clinical Interpretation: This EEG demonstrates evidence of profound generalized cerebral dysfunction  as can be seen with hypoxic brain injury. In the post-cardiac arrest setting, this EEG is highly correlated with poor prognosis. Levetiracetam and valproic acid can both sometimes be useful in controlling the symptomatic movements, as can propofol. Roland Rack, MD [facility] [phone] If 7pm- 7am, please page neurology on call as listed in [place].   Overnight EEG with video  Result Date: 06/23/2020 Lora Havens, MD  06/24/2020  8:48 AM Patient Name: SAHIL MILNER MRN: [id number] Epilepsy Attending: Lora Havens Referring Physician/Provider: Dr. Kathrynn Speed Duration: 06/23/2020 0203 to 06/24/2020 0203 Patient history: 65 year old male status post cardiac arrest.  EEG done for seizures. Level of alertness: Comatose. AEDs during EEG study: Keppra, Depakote, propofol Technical aspects: This EEG study was done with scalp electrodes positioned according to the 10-20 International system of electrode placement. Electrical activity was acquired at a sampling rate of 500Hz  and reviewed with a high frequency filter of 70Hz  and a low frequency filter of 1Hz . EEG data were recorded continuously and digitally stored. Description: EEG initially showed near continuous generalized background suppression.  Every 2 to 15 seconds generalized highly epileptiform bursts were noted.  During the burst patient was noted to have brief generalized whole body jerking consistent with myoclonic seizure. After around 8 AM on 06/23/2020, EEG predominantly showed generalized background suppression. After around 2300 on 06/14/2020, EEG again showed intermittent bursts of high amplitude 3 to 5 Hz sharply contoured theta-delta slowing lasting 0.5-1 second every 10-30 seconds. EEG was not reactive to tactile stimulation. Hyperventilation and photic stimulation were not performed.   ABNORMALITY -Myoclonic seizure, generalized -Burst suppression with highly epileptiform discharges, generalized IMPRESSION: This  study initially showed myoclonic seizures every 2 to 15 seconds. After around 8 AM on 06/23/2020, no further seizures were noted. EEG also showed profound diffuse encephalopathy.  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 Lora Havens   ECHOCARDIOGRAM COMPLETE  Result Date: 06/24/2020    ECHOCARDIOGRAM REPORT   Patient Name:   WOODFIN KISS Date of Exam: 06/24/2020 Medical Rec #:  [id number]      Height:       61.0 in Accession #:    [id number]     Weight:       182.5 lb Date of Birth:  [birth year]      BSA:          1.817 m Patient Age:    29 years       BP:           149/68 mmHg Patient Gender: M              HR:           80 bpm. Exam Location:  Inpatient Procedure: 2D Echo, Cardiac Doppler, Color Doppler, Strain Analysis and 3D Echo Indications:    Cardiac Arrest I46.9  History:        Patient has prior history of Echocardiogram examinations, most                 recent 12/18/2019. Signs/Symptoms:Shortness of Breath; Risk                 Factors:Hypertension.  Sonographer:    Merrie Roof RDCS Referring Phys: [id number] RAHUL P DESAI IMPRESSIONS  1. Left ventricular ejection fraction, by estimation, is 60 to 65%. The left ventricle has normal function. The left ventricle has no regional wall motion abnormalities. There is moderate concentric left ventricular hypertrophy. Left ventricular diastolic parameters are consistent with Grade I diastolic dysfunction (impaired relaxation). Elevated left ventricular end-diastolic pressure.  2. Right ventricular systolic function is normal. The right ventricular size is normal.  3. The mitral valve is normal in structure. Trivial mitral valve regurgitation. No evidence of mitral stenosis. Moderate mitral annular calcification.  4. The aortic valve is tricuspid. There is mild calcification of the aortic valve. There is mild thickening  of the aortic valve. Aortic valve regurgitation is not visualized. No aortic stenosis is  present.  5. IVC not dilated. Unable to assess for respiratory collapse as patient is on a ventilator. FINDINGS  Left Ventricle: Left ventricular ejection fraction, by estimation, is 60 to 65%. The left ventricle has normal function. The left ventricle has no regional wall motion abnormalities. Global longitudinal strain performed but not reported based on interpreter judgement due to suboptimal tracking. The left ventricular internal cavity size was normal in size. There is moderate concentric left ventricular hypertrophy. Left ventricular diastolic parameters are consistent with Grade I diastolic dysfunction (impaired relaxation). Elevated left ventricular end-diastolic pressure. Right Ventricle: The right ventricular size is normal. No increase in right ventricular wall thickness. Right ventricular systolic function is normal. Left Atrium: Left atrial size was normal in size. Right Atrium: Right atrial size was normal in size. Pericardium: There is no evidence of pericardial effusion. Mitral Valve: The mitral valve is normal in structure. Moderate mitral annular calcification. Trivial mitral valve regurgitation. No evidence of mitral valve stenosis. Tricuspid Valve: The tricuspid valve is normal in structure. Tricuspid valve regurgitation is not demonstrated. No evidence of tricuspid stenosis. Aortic Valve: The aortic valve is tricuspid. There is mild calcification of the aortic valve. There is mild thickening of the aortic valve. Aortic valve regurgitation is not visualized. No aortic stenosis is present. Aortic valve mean gradient measures 6.0 mmHg. Aortic valve peak gradient measures 13.5 mmHg. Aortic valve area, by VTI measures 1.69 cm. Pulmonic Valve: The pulmonic valve was normal in structure. Pulmonic valve regurgitation is trivial. No evidence of pulmonic stenosis. Aorta: The aortic root is normal in size and structure. Venous: IVC not dilated. Unable to assess for respiratory collapse as patient is on a  ventilator. IAS/Shunts: No atrial level shunt detected by color flow Doppler.  LEFT VENTRICLE PLAX 2D LVIDd:         2.90 cm  Diastology LVIDs:         2.30 cm  LV e' lateral:   3.81 cm/s LV PW:         1.60 cm  LV E/e' lateral: 23.0 LV IVS:        1.60 cm LVOT diam:     2.00 cm LV SV:         56 LV SV Index:   31 LVOT Area:     3.14 cm 3D Volume EF:                         3D EF:        54 %                         LV EDV:       98 ml                         LV ESV:       45 ml                         LV SV:        52 ml RIGHT VENTRICLE RV Basal diam:  4.20 cm RV Mid diam:    3.40 cm LEFT ATRIUM             Index       RIGHT ATRIUM  Index LA diam:        3.80 cm 2.09 cm/m  RA Area:     16.50 cm LA Vol (A2C):   36.2 ml 19.92 ml/m RA Volume:   44.40 ml  24.44 ml/m LA Vol (A4C):   28.6 ml 15.74 ml/m LA Biplane Vol: 32.2 ml 17.72 ml/m  AORTIC VALVE AV Area (Vmax):    1.62 cm AV Area (Vmean):   1.85 cm AV Area (VTI):     1.69 cm AV Vmax:           184.00 cm/s AV Vmean:          118.000 cm/s AV VTI:            0.329 m AV Peak Grad:      13.5 mmHg AV Mean Grad:      6.0 mmHg LVOT Vmax:         94.60 cm/s LVOT Vmean:        69.500 cm/s LVOT VTI:          0.177 m LVOT/AV VTI ratio: 0.54  AORTA Ao Root diam: 3.10 cm Ao Asc diam:  3.20 cm MITRAL VALVE MV Area (PHT): 3.03 cm     SHUNTS MV Decel Time: 250 msec     Systemic VTI:  0.18 m MV E velocity: 87.80 cm/s   Systemic Diam: 2.00 cm MV A velocity: 139.00 cm/s MV E/A ratio:  0.63 Skeet Latch MD Electronically signed by Skeet Latch MD Signature Date/Time: 06/24/2020/4:32:03 PM    Final    US Abdomen Limited RUQ (LIVER/GB)  Result Date: 06/13/2020 CLINICAL DATA:  65 year old with nausea and vomiting. EXAM: ULTRASOUND ABDOMEN LIMITED RIGHT UPPER QUADRANT COMPARISON:  01/15/2018 FINDINGS: Gallbladder: Gallbladder is mildly distended and there are multiple small echogenic foci compatible with shadowing gallstones. Index stone measures approximately  5 mm. Reportedly, the patient has a sonographic Murphy sign. There is no evidence for gallbladder wall thickening or pericholecystic fluid. Evidence for echogenic sludge. Common bile duct: Diameter: 3 mm Liver: No focal lesion identified. Within normal limits in parenchymal echogenicity. Portal vein is patent on color Doppler imaging with normal direction of blood flow towards the liver. Other: None. IMPRESSION: Gallbladder is mildly distended with cholelithiasis. Reportedly, the patient has a sonographic Murphy sign. Findings are concerning for acute cholecystitis. Electronically Signed   By: Markus Daft M.D.   On: 06/14/2020 08:44    Microbiology Recent Results (from the past 240 hour(s))  Culture, Respiratory w Gram Stain     Status: None   Collection Time: 06/27/20 11:03 AM   Specimen: Tracheal Aspirate; Respiratory  Result Value Ref Range Status   Specimen Description TRACHEAL ASPIRATE  Final   Special Requests NONE  Final   Gram Stain   Final    MODERATE WBC PRESENT,BOTH PMN AND MONONUCLEAR FEW GRAM POSITIVE COCCI RARE GRAM NEGATIVE RODS RARE YEAST Performed at [facility], [place] [address] [place], [place] [address]    Culture   Final    ABUNDANT METHICILLIN RESISTANT STAPHYLOCOCCUS AUREUS   Report Status 06/30/2020 FINAL  Final   Organism ID, Bacteria METHICILLIN RESISTANT STAPHYLOCOCCUS AUREUS  Final      Susceptibility   Methicillin resistant staphylococcus aureus - MIC*    CIPROFLOXACIN >=8 RESISTANT Resistant     ERYTHROMYCIN >=8 RESISTANT Resistant     GENTAMICIN <=0.5 SENSITIVE Sensitive     OXACILLIN >=4 RESISTANT Resistant     TETRACYCLINE <=1 SENSITIVE Sensitive     VANCOMYCIN 1  SENSITIVE Sensitive     TRIMETH/SULFA <=10 SENSITIVE Sensitive     CLINDAMYCIN >=8 RESISTANT Resistant     RIFAMPIN <=0.5 SENSITIVE Sensitive     Inducible Clindamycin NEGATIVE Sensitive     * ABUNDANT METHICILLIN RESISTANT STAPHYLOCOCCUS AUREUS  Culture, blood (Routine X 2) w Reflex to  ID Panel     Status: None   Collection Time: 06/27/20 11:57 AM   Specimen: BLOOD  Result Value Ref Range Status   Specimen Description BLOOD LEFT ANTECUBITAL  Final   Special Requests   Final    BOTTLES DRAWN AEROBIC AND ANAEROBIC Blood Culture results may not be optimal due to an inadequate volume of blood received in culture bottles   Culture   Final    NO GROWTH 5 DAYS Performed at [facility] [address] [place], [place] [address]    Report Status Jul 22, 2020 FINAL  Final  Culture, blood (Routine X 2) w Reflex to ID Panel     Status: None   Collection Time: 06/27/20 12:03 PM   Specimen: BLOOD  Result Value Ref Range Status   Specimen Description BLOOD LEFT ANTECUBITAL  Final   Special Requests   Final    BOTTLES DRAWN AEROBIC AND ANAEROBIC Blood Culture results may not be optimal due to an inadequate volume of blood received in culture bottles   Culture   Final    NO GROWTH 5 DAYS Performed at [facility], [place] [address] [place], [place] [address]    Report Status 07/22/20 FINAL  Final    Lab Basic Metabolic Panel: Recent Labs  Lab 06/29/20 0657 06/29/20 1601 06/30/20 0615 06/30/20 0930 06/30/20 1150 06/30/20 1600 07/01/20 0437 07/01/20 1721 Jul 22, 2020 0322 2020-07-22 1619  NA 134*   < > 135 139   < > 135 138 139 140 139  K 4.7   < > 4.9 4.0   < > 4.7 4.9 3.7 4.8 5.9*  CL 100   < > 98 94*  --  101 101 102 102 101  CO2 15*   < > 12* 16*  --  16* 14* 19* 19* 18*  GLUCOSE 131*   < > 145* 286*  --  217* 45* 53* 95 88  BUN 152*   < > 189* 128*  --  146* 159* 104* 128* 135*  CREATININE 7.03*   < > 8.55* 6.33*  --  6.80* 7.65* 5.41* 6.68* 7.51*  CALCIUM 8.3*   < > 8.0* 9.9  --  7.5* 8.1* 8.1* 8.0* 7.8*  MG 3.2*  --  3.3* 4.9*  --   --  3.6*  --  3.1*  --   PHOS 7.7*   < > 8.6* 10.8*  --  8.2* 9.4* 6.2* 9.3* 10.8*   < > = values in this interval not displayed.   Liver Function Tests: Recent Labs  Lab 06/27/20 0445 06/27/20 1622 06/30/20 0930  06/30/20 1600 07/01/20 0437 07/01/20 1721 2020-07-22 0322 2020-07-22 1619  AST 50*  --  400*  --   --   --   --   --   ALT 77*  --  313*  --   --   --   --   --   ALKPHOS 157*  --  78  --   --   --   --   --   BILITOT 0.3  --  0.7  --   --   --   --   --  PROT 5.7*  --  4.4*  --   --   --   --   --   ALBUMIN 2.3*   < > 1.7* 1.8* 2.0* 1.9* 1.8* 1.8*   < > = values in this interval not displayed.   No results for input(s): LIPASE, AMYLASE in the last 168 hours. Recent Labs  Lab 06/30/20 0930  AMMONIA 109*   CBC: Recent Labs  Lab 06/28/20 0430 06/29/20 0657 06/30/20 0930 06/30/20 1150 06/30/20 1447 07/01/20 0437 Jul 28, 2020 0322  WBC 40.4* 38.8* 34.6*  --   --  35.1* 36.8*  HGB 8.2* 8.4* 6.3* 7.1* 6.4* 8.3* 7.6*  HCT 25.1* 25.5* 19.8* 21.0* 19.4* 24.0* 22.3*  MCV 96.5 95.1 101.0*  --   --  92.7 95.3  PLT 322 380 280  --   --  320 300   Cardiac Enzymes: No results for input(s): CKTOTAL, CKMB, CKMBINDEX, TROPONINI in the last 168 hours. Sepsis Labs: Recent Labs  Lab 06/29/20 0657 06/30/20 0930 07/01/20 0437 07/28/20 0322  WBC 38.8* 34.6* 35.1* 36.8*  LATICACIDVEN  --  10.5*  --   --     Candee Furbish 07/03/2020, 4:44 PM

## 2022-06-04 IMAGING — DX DG ABD PORTABLE 1V
1 series · 1 of 1 positions shown · non-contrast
Comparison: Abdominopelvic CT earlier today

CLINICAL DATA: NG tube placement.

EXAM:
PORTABLE ABDOMEN - 1 VIEW

[abdomen supine]
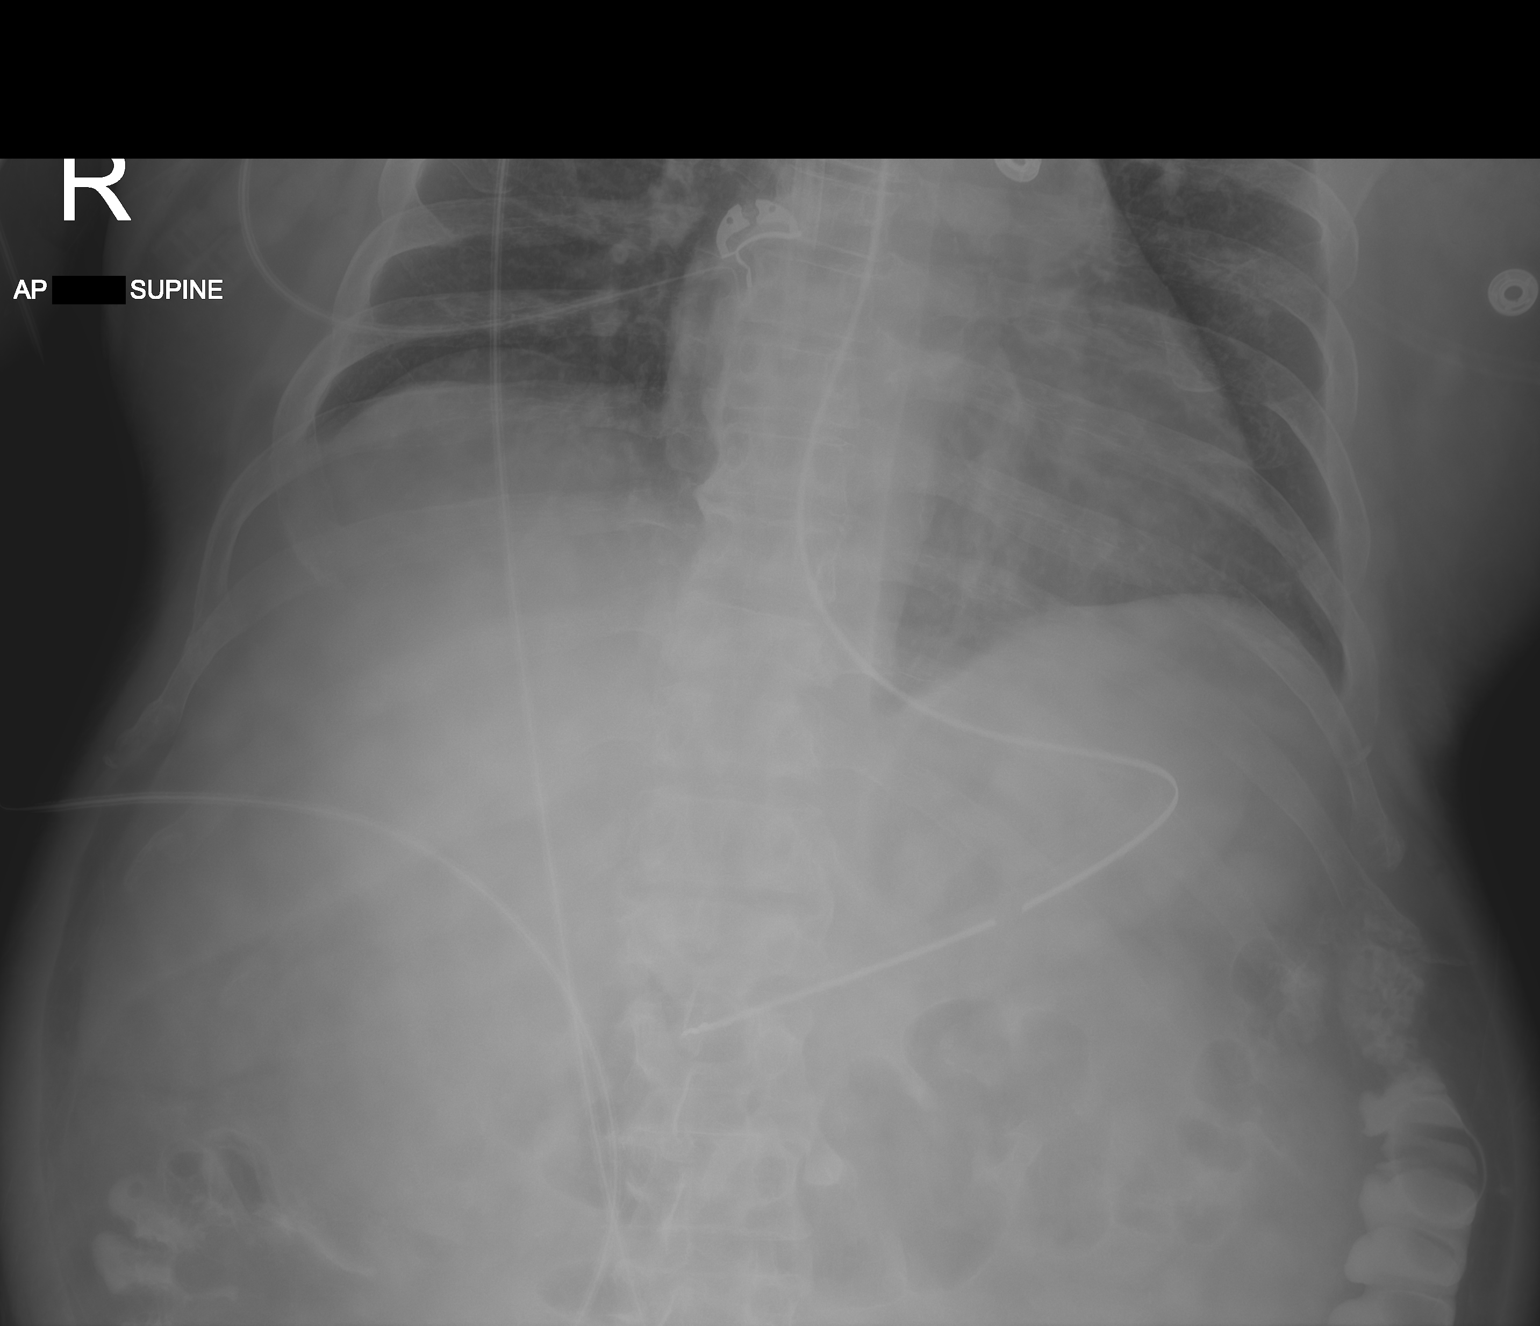

[1 of 1 positions shown; findings below may reference images not displayed]

FINDINGS: Tip and side port of the enteric tube below the diaphragm in the
stomach enteric contrast is seen throughout the colon from prior CT.
No free air or obstruction. Known gallstone is not well visualized
IMPRESSION: Tip and side port of the enteric tube below the diaphragm in the
stomach.

## 2022-06-08 IMAGING — DX DG CHEST 1V PORT
1 series · 1 of 1 positions shown · non-contrast
Comparison: 06/24/2020

CLINICAL DATA: Respiratory failure

EXAM:
PORTABLE CHEST 1 VIEW

[chest ap]
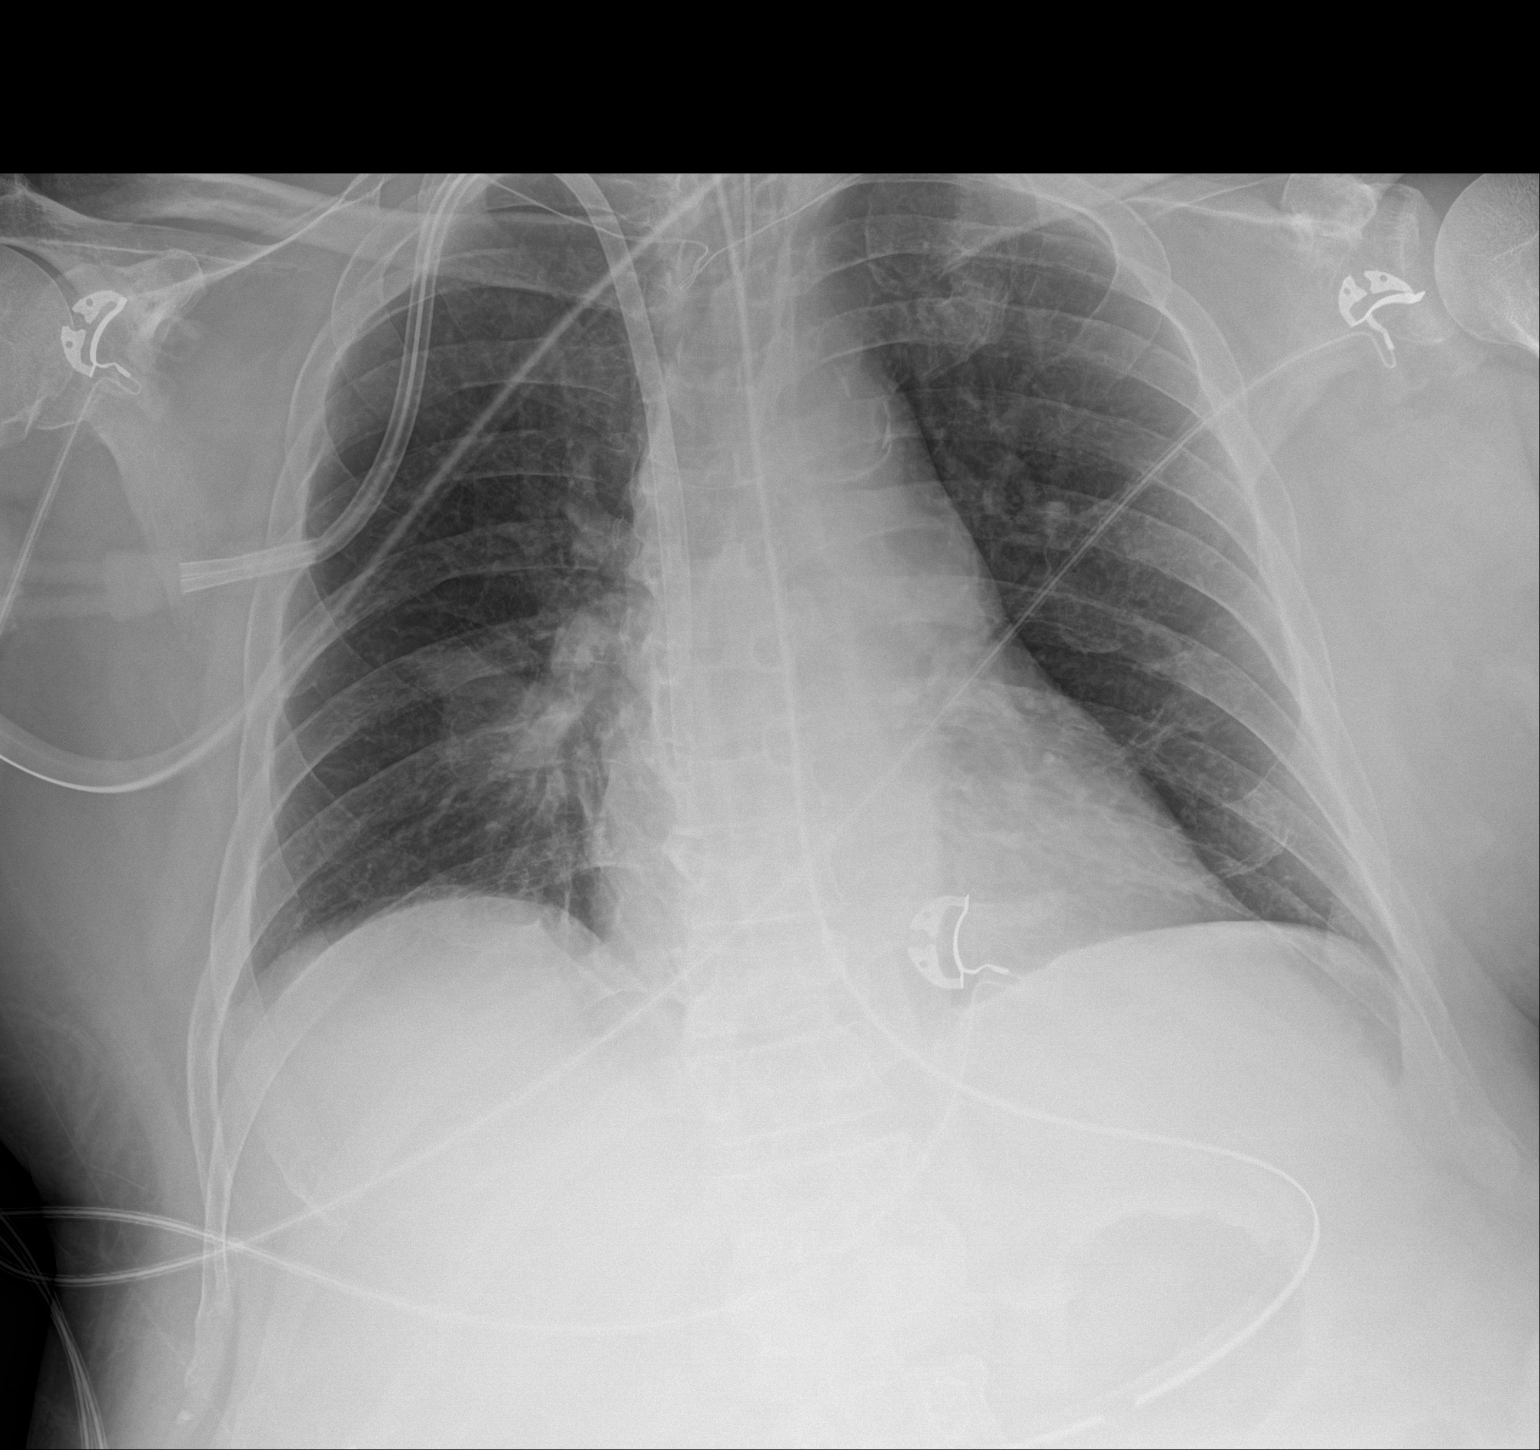

[1 of 1 positions shown; findings below may reference images not displayed]

FINDINGS: Endotracheal tube is been withdrawn, now 6.1 cm above the carina.
Nasogastric tube extends into the upper abdomen beyond the margin of
the examination. Right internal jugular hemodialysis catheter tip
noted at the superior cavoatrial junction. The lungs are
symmetrically well expanded. Focal opacity within the right
infrahilar region may represent right middle lobe atelectasis or
infiltrate. The lungs are otherwise clear. No pneumothorax or
pleural effusion. Cardiac size within normal limits. Pulmonary
vascularity is normal.
IMPRESSION: Support lines and tubes in appropriate position.

Improved pulmonary insufflation.

Focal opacity within the right infrahilar region, atelectasis versus
infiltrate.
